# Patient Record
Sex: Male | Born: 1935
Health system: Southern US, Community
[De-identification: ages and names within clinical notes are randomized; demographics above are authoritative.]

## PROBLEM LIST (undated history)

## (undated) DIAGNOSIS — E039 Hypothyroidism, unspecified: Secondary | ICD-10-CM

## (undated) DIAGNOSIS — I10 Essential (primary) hypertension: Secondary | ICD-10-CM

## (undated) DIAGNOSIS — H409 Unspecified glaucoma: Secondary | ICD-10-CM

## (undated) DIAGNOSIS — Z8669 Personal history of other diseases of the nervous system and sense organs: Secondary | ICD-10-CM

## (undated) DIAGNOSIS — M1990 Unspecified osteoarthritis, unspecified site: Secondary | ICD-10-CM

## (undated) DIAGNOSIS — G5620 Lesion of ulnar nerve, unspecified upper limb: Secondary | ICD-10-CM

## (undated) DIAGNOSIS — J349 Unspecified disorder of nose and nasal sinuses: Secondary | ICD-10-CM

## (undated) HISTORY — DX: Lesion of ulnar nerve, unspecified upper limb: G56.20

## (undated) HISTORY — PX: JOINT REPLACEMENT: SHX530

## (undated) HISTORY — PX: TONSILLECTOMY: SUR1361

## (undated) HISTORY — PX: RETINAL DETACHMENT REPAIR W/ SCLERAL BUCKLE LE: SHX2338

## (undated) HISTORY — DX: Essential (primary) hypertension: I10

## (undated) HISTORY — DX: Personal history of other diseases of the nervous system and sense organs: Z86.69

## (undated) HISTORY — DX: Unspecified glaucoma: H40.9

## (undated) HISTORY — PX: COLONOSCOPY: SHX174

## (undated) HISTORY — PX: APPENDECTOMY: SHX54

## (undated) HISTORY — DX: Unspecified disorder of nose and nasal sinuses: J34.9

## (undated) HISTORY — PX: CATARACT EXTRACTION: SUR2

---

## 2004-11-10 ENCOUNTER — Ambulatory Visit: Payer: Self-pay | Admitting: Ophthalmology

## 2004-11-16 ENCOUNTER — Ambulatory Visit: Payer: Self-pay | Admitting: Ophthalmology

## 2010-08-05 ENCOUNTER — Ambulatory Visit: Payer: Self-pay | Admitting: Gastroenterology

## 2011-05-06 ENCOUNTER — Emergency Department: Payer: Self-pay | Admitting: Emergency Medicine

## 2011-09-27 ENCOUNTER — Ambulatory Visit: Payer: Self-pay | Admitting: Unknown Physician Specialty

## 2011-10-04 ENCOUNTER — Inpatient Hospital Stay: Payer: Self-pay | Admitting: Unknown Physician Specialty

## 2011-10-04 HISTORY — PX: KNEE SURGERY: SHX244

## 2011-10-06 ENCOUNTER — Encounter: Payer: Self-pay | Admitting: Internal Medicine

## 2011-10-28 ENCOUNTER — Encounter: Payer: Self-pay | Admitting: Internal Medicine

## 2011-11-27 ENCOUNTER — Encounter: Payer: Self-pay | Admitting: Internal Medicine

## 2011-12-28 ENCOUNTER — Encounter: Payer: Self-pay | Admitting: Internal Medicine

## 2012-01-28 ENCOUNTER — Encounter: Payer: Self-pay | Admitting: Internal Medicine

## 2013-08-16 ENCOUNTER — Emergency Department: Payer: Self-pay | Admitting: Emergency Medicine

## 2013-12-19 ENCOUNTER — Ambulatory Visit: Payer: Self-pay | Admitting: Podiatry

## 2013-12-21 ENCOUNTER — Encounter: Payer: Self-pay | Admitting: Podiatry

## 2013-12-24 ENCOUNTER — Ambulatory Visit (INDEPENDENT_AMBULATORY_CARE_PROVIDER_SITE_OTHER): Payer: Medicare Other | Admitting: Podiatry

## 2013-12-24 ENCOUNTER — Encounter: Payer: Self-pay | Admitting: Podiatry

## 2013-12-24 VITALS — BP 112/67 | HR 67 | Resp 16

## 2013-12-24 DIAGNOSIS — B351 Tinea unguium: Secondary | ICD-10-CM

## 2013-12-24 DIAGNOSIS — M79609 Pain in unspecified limb: Secondary | ICD-10-CM

## 2013-12-24 DIAGNOSIS — M79603 Pain in arm, unspecified: Secondary | ICD-10-CM

## 2013-12-24 NOTE — Progress Notes (Signed)
Daniel Carr presents today with a chief complaint of painful elongated toenails bilateral.  Objective: Pulses are strongly palpable bilateral nails are thick yellow dystrophic onychomycotic and painful palpation.  Assessment: Pain in limb secondary to onychomycosis 1 through 5 bilateral.  Plan: Debridement of nails 1 through 5 bilateral covered service secondary to pain.

## 2014-03-25 ENCOUNTER — Ambulatory Visit (INDEPENDENT_AMBULATORY_CARE_PROVIDER_SITE_OTHER): Payer: Commercial Managed Care - HMO | Admitting: Podiatry

## 2014-03-25 VITALS — Resp 16 | Ht 72.0 in | Wt 209.0 lb

## 2014-03-25 DIAGNOSIS — M79609 Pain in unspecified limb: Secondary | ICD-10-CM

## 2014-03-25 DIAGNOSIS — B351 Tinea unguium: Secondary | ICD-10-CM

## 2014-03-25 NOTE — Progress Notes (Signed)
He presents in a chief complaint of painful elongated toenails one through 5 bilateral. He also is complaining of painful lesions to the plantar aspect of the second metatarsophalangeal joint bilateral.  Objective: Vital signs are stable he is alert and oriented x3. His nails thick yellow dystrophic with mycotic painful palpation as well as debridement. He also has painful porokeratotic lesions sub-second metatarsophalangeal joint bilateral.  Assessment: Pain in limb secondary to onychomycosis 1 through 5 bilateral.  Porokeratosis bilateral foot.  Plan: Debridement of nails in thickness and length as a covered service secondary to pain debridement of all reactive hyperkeratosis.

## 2014-06-03 ENCOUNTER — Ambulatory Visit: Payer: Self-pay | Admitting: Family Medicine

## 2014-06-03 LAB — LIPID PANEL
Cholesterol: 174 mg/dL (ref 0–200)
HDL: 76 mg/dL — AB (ref 35–70)
LDL Cholesterol: 82 mg/dL
Triglycerides: 78 mg/dL (ref 40–160)

## 2014-06-03 LAB — BASIC METABOLIC PANEL
BUN: 10 mg/dL (ref 4–21)
Creatinine: 1 mg/dL (ref 0.6–1.3)
Glucose: 94 mg/dL
Potassium: 3.9 mmol/L (ref 3.4–5.3)
Sodium: 136 mmol/L — AB (ref 137–147)

## 2014-06-03 LAB — TSH: TSH: 2.83 u[IU]/mL (ref 0.41–5.90)

## 2014-06-26 ENCOUNTER — Ambulatory Visit: Payer: Commercial Managed Care - HMO | Admitting: Podiatry

## 2014-07-04 ENCOUNTER — Ambulatory Visit (INDEPENDENT_AMBULATORY_CARE_PROVIDER_SITE_OTHER): Payer: Commercial Managed Care - HMO | Admitting: Podiatry

## 2014-07-04 ENCOUNTER — Encounter: Payer: Self-pay | Admitting: Podiatry

## 2014-07-04 VITALS — BP 113/61 | HR 80 | Resp 16

## 2014-07-04 DIAGNOSIS — B351 Tinea unguium: Secondary | ICD-10-CM

## 2014-07-04 DIAGNOSIS — M79609 Pain in unspecified limb: Secondary | ICD-10-CM

## 2014-07-04 DIAGNOSIS — M79676 Pain in unspecified toe(s): Secondary | ICD-10-CM

## 2014-07-04 NOTE — Progress Notes (Signed)
He presents today for chief complaint of painful elongated toenails one through 5 bilateral and painful porokeratosis bilateral.  Objective: Vital signs are stable he is alert and oriented x3 nails are thick yellow dystrophic onychomycotic and painful palpation. Pulses are intact bilateral. Porokeratotic lesions plantar aspect the bilateral foot.  Assessment: Diabetes with hammertoe deformities bilateral. Porokeratotic lesions bilateral. Pain in limb secondary to onychomycosis 1 through 5 bilateral.  Plan: Debridement of nails 1 through 5 bilateral covered service secondary to pain.

## 2014-10-09 ENCOUNTER — Ambulatory Visit (INDEPENDENT_AMBULATORY_CARE_PROVIDER_SITE_OTHER): Payer: Commercial Managed Care - HMO | Admitting: Podiatry

## 2014-10-09 DIAGNOSIS — M79676 Pain in unspecified toe(s): Secondary | ICD-10-CM

## 2014-10-09 DIAGNOSIS — B351 Tinea unguium: Secondary | ICD-10-CM

## 2014-10-09 NOTE — Progress Notes (Signed)
Presents today chief complaint of painful elongated toenails.  Objective: Pulses are palpable bilateral nails are thick, yellow dystrophic onychomycosis and painful palpation.   Assessment: Onychomycosis with pain in limb.  Plan: Treatment of nails in thickness and length as covered service secondary to pain.  

## 2015-01-08 ENCOUNTER — Ambulatory Visit (INDEPENDENT_AMBULATORY_CARE_PROVIDER_SITE_OTHER): Payer: Commercial Managed Care - HMO | Admitting: Podiatry

## 2015-01-08 DIAGNOSIS — B351 Tinea unguium: Secondary | ICD-10-CM

## 2015-01-08 DIAGNOSIS — M79673 Pain in unspecified foot: Secondary | ICD-10-CM

## 2015-01-08 NOTE — Progress Notes (Signed)
He presents today with chief complaint of painful elongated toenails 1 through 5 bilateral.  Objective: Pain in limb secondary to onychomycosis 1 through 5 bilateral. Pulses remain palpable  Assessment: Pain limps a neurologic mycosis 1 through 5 bilateral.  Plan: Debridement of nails 1 through 5 bilateral service secondary to pain.

## 2015-01-20 DIAGNOSIS — J209 Acute bronchitis, unspecified: Secondary | ICD-10-CM | POA: Diagnosis not present

## 2015-01-27 ENCOUNTER — Ambulatory Visit: Payer: Self-pay | Admitting: Family Medicine

## 2015-01-27 DIAGNOSIS — R05 Cough: Secondary | ICD-10-CM | POA: Diagnosis not present

## 2015-01-27 DIAGNOSIS — J984 Other disorders of lung: Secondary | ICD-10-CM | POA: Diagnosis not present

## 2015-04-09 ENCOUNTER — Ambulatory Visit (INDEPENDENT_AMBULATORY_CARE_PROVIDER_SITE_OTHER): Payer: Commercial Managed Care - HMO | Admitting: Podiatry

## 2015-04-09 DIAGNOSIS — M79676 Pain in unspecified toe(s): Secondary | ICD-10-CM | POA: Diagnosis not present

## 2015-04-09 DIAGNOSIS — B351 Tinea unguium: Secondary | ICD-10-CM

## 2015-04-09 NOTE — Progress Notes (Signed)
He presents today with chief complaint of painful elongated toenails 1 through 5 bilateral.  Objective: Pain in limb secondary to onychomycosis 1 through 5 bilateral. Pulses remain palpable                   Mild sub 2 callus B. 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  Plan: Debridement of nails 1 through 5 bilateral service secondary to pain.

## 2015-04-21 ENCOUNTER — Other Ambulatory Visit: Payer: Self-pay | Admitting: Family Medicine

## 2015-06-04 DIAGNOSIS — H4011X2 Primary open-angle glaucoma, moderate stage: Secondary | ICD-10-CM | POA: Diagnosis not present

## 2015-06-05 ENCOUNTER — Encounter: Payer: Self-pay | Admitting: *Deleted

## 2015-06-06 ENCOUNTER — Encounter: Payer: Self-pay | Admitting: Family Medicine

## 2015-06-06 ENCOUNTER — Ambulatory Visit (INDEPENDENT_AMBULATORY_CARE_PROVIDER_SITE_OTHER): Payer: Commercial Managed Care - HMO | Admitting: Family Medicine

## 2015-06-06 ENCOUNTER — Other Ambulatory Visit: Payer: Self-pay | Admitting: Family Medicine

## 2015-06-06 VITALS — BP 120/80 | HR 75 | Temp 98.0°F | Resp 16 | Ht 72.0 in | Wt 215.0 lb

## 2015-06-06 DIAGNOSIS — E039 Hypothyroidism, unspecified: Secondary | ICD-10-CM | POA: Diagnosis not present

## 2015-06-06 DIAGNOSIS — E7800 Pure hypercholesterolemia, unspecified: Secondary | ICD-10-CM | POA: Insufficient documentation

## 2015-06-06 DIAGNOSIS — N401 Enlarged prostate with lower urinary tract symptoms: Secondary | ICD-10-CM

## 2015-06-06 DIAGNOSIS — I10 Essential (primary) hypertension: Secondary | ICD-10-CM

## 2015-06-06 DIAGNOSIS — M069 Rheumatoid arthritis, unspecified: Secondary | ICD-10-CM

## 2015-06-06 DIAGNOSIS — N400 Enlarged prostate without lower urinary tract symptoms: Secondary | ICD-10-CM

## 2015-06-06 DIAGNOSIS — Z125 Encounter for screening for malignant neoplasm of prostate: Secondary | ICD-10-CM | POA: Diagnosis not present

## 2015-06-06 DIAGNOSIS — N529 Male erectile dysfunction, unspecified: Secondary | ICD-10-CM

## 2015-06-06 DIAGNOSIS — H409 Unspecified glaucoma: Secondary | ICD-10-CM | POA: Insufficient documentation

## 2015-06-06 DIAGNOSIS — E079 Disorder of thyroid, unspecified: Secondary | ICD-10-CM

## 2015-06-06 DIAGNOSIS — R351 Nocturia: Secondary | ICD-10-CM

## 2015-06-06 DIAGNOSIS — Z0000 Encounter for general adult medical examination without abnormal findings: Secondary | ICD-10-CM | POA: Diagnosis not present

## 2015-06-06 DIAGNOSIS — M129 Arthropathy, unspecified: Secondary | ICD-10-CM

## 2015-06-06 NOTE — Progress Notes (Signed)
Patient: Daniel Carr, Male    DOB: [birth year], 80 y.o.   MRN: [id number] Visit Date: 06/09/2015  Today's Provider: Lelon Huh, MD   Chief Complaint  Patient presents with  . Medicare Wellness  . Hypertension  . Erectile Dysfunction  . Hyperlipidemia  . Hypothyroidism   Subjective:    Annual physica  Daniel Carr is a 80 y.o. male who presents today for his Physical.  He feels well. He reports exercising rarely. He reports he is sleeping fairly well.   ------------------------------------------------------------------------   Hypertension, follow-up:  BP Readings from Last 3 Encounters:  06/06/15 120/80  01/27/15 112/80  07/04/14 113/61    He was last seen for hypertension 12 months ago.  BP at that visit was 113/61. Management changes since that visit include none .He reports good compliance with treatment. He is not having side effects. none  He is not exercising. He is adherent to low salt diet.   Outside blood pressures are normal. He is experiencing none.  Patient denies fatigue.   .  Use of agents associated with hypertension: none.   ------------------------------------------------------------------------    Lipid/Cholesterol, Follow-up:   Last seen for this 1 years ago.  Management changes since that visit include none.  Last Lipid Panel:    Component Value Date/Time   CHOL 188 06/06/2015 1044   CHOL 174 06/03/2014   TRIG 82 06/06/2015 1044   HDL 81 06/06/2015 1044   HDL 76* 06/03/2014   CHOLHDL 2.3 06/06/2015 1044   LDLCALC 91 06/06/2015 1044   LDLCALC 82 06/03/2014    He reports good compliance with treatment. He is not having side effects. none  Wt Readings from Last 3 Encounters:  06/06/15 215 lb (97.523 kg)  01/27/15 207 lb (93.895 kg)  03/25/14 209 lb (94.802 kg)    ------------------------------------------------------------------------   ED He has done well with prn use of cialis and viagra.     Review of Systems   Constitutional: Positive for fatigue. Negative for activity change and appetite change.  HENT: Positive for congestion (Allergies) and postnasal drip. Negative for dental problem, drooling, ear discharge, ear pain, hearing loss, mouth sores, nosebleeds and sore throat.   Eyes: Negative for pain, discharge, redness and itching.  Respiratory: Positive for wheezing. Negative for cough and chest tightness.   Cardiovascular: Positive for leg swelling. Negative for chest pain and palpitations.  Gastrointestinal: Negative for abdominal pain, diarrhea, constipation, blood in stool and anal bleeding.  Genitourinary: Negative for frequency, flank pain and difficulty urinating.  Musculoskeletal: Positive for back pain. Negative for joint swelling, neck pain and neck stiffness.  Skin: Positive for rash.       Elbows  Neurological: Negative for dizziness, tremors, weakness, light-headedness, numbness and headaches.  Hematological: Does not bruise/bleed easily.  Psychiatric/Behavioral: Negative for sleep disturbance.    History   Social History  . Marital Status: Married    Spouse Name: N/A  . Number of Children: N/A  . Years of Education: college   Occupational History  . Not on file.   Social History Main Topics  . Smoking status: Former Research scientist (life sciences)  . Smokeless tobacco: Never Used     Comment: quit in 1970's  . Alcohol Use: No  . Drug Use: No  . Sexual Activity: Yes   Other Topics Concern  . Not on file   Social History Narrative    Patient Active Problem List   Diagnosis Date Noted  . Glaucoma   . Rheumatoid arthritis   . Thyroid disorder   .  Pure hypercholesterolemia   . Hypertension, benign   . Arthritis, multiple joint involvement   . BPH (benign prostatic hyperplasia)   . ED (erectile dysfunction)   . Nocturia associated with benign prostatic hypertrophy     Past Surgical History  Procedure Laterality Date  . Knee surgery  10/04/2011  . Appendectomy    . Cataract  extraction Bilateral   . Retinal detachment repair w/ scleral buckle le      His family history includes Hypertension in his father and mother; Stroke in his father and sister.    Previous Medications   ASPIRIN 81 MG TABLET    Take 81 mg by mouth daily.   BRIMONIDINE (ALPHAGAN) 0.2 % OPHTHALMIC SOLUTION       FINASTERIDE (PROSCAR) 5 MG TABLET    Take 1 tablet by mouth daily.   HYDROCHLOROTHIAZIDE (HYDRODIURIL) 25 MG TABLET       IBUPROFEN (ADVIL,MOTRIN) 200 MG TABLET    Take 1 tablet by mouth every 6 (six) hours as needed.   LEVOTHYROXINE SODIUM (SYNTHROID PO)    Take 125 mg by mouth daily.    LORATADINE (CLARITIN) 10 MG TABLET    1 tablet daily.   LOVASTATIN PO    Take 20 mg by mouth daily.    MULTIPLE VITAMIN (DAILY VITAMIN PO)    Take by mouth.   SILDENAFIL (VIAGRA) 50 MG TABLET    Take 50 mg by mouth daily as needed for erectile dysfunction.   SYNTHROID 125 MCG TABLET    Take 1 tablet by mouth daily.   TRAVATAN Z 0.004 % SOLN OPHTHALMIC SOLUTION        Patient Care Team: [name], MD as PCP - General (Family Medicine)     Objective:   Vitals: BP 120/80 mmHg  Pulse 75  Temp(Src) 98 F (36.7 C) (Oral)  Resp 16  Ht 6' (1.829 m)  Wt 215 lb (97.523 kg)  BMI 29.15 kg/m2  SpO2 97%  Physical Exam  General Appearance:    Alert, cooperative, no distress, appears stated age  Head:    Normocephalic, without obvious abnormality, atraumatic  Eyes:    PERRL, conjunctiva/corneas clear, EOM's intact, fundi    benign, both eyes       Ears:    Normal TM's and external ear canals, both ears  Nose:   Nares normal, septum midline, mucosa normal, no drainage   or sinus tenderness  Throat:   Lips, mucosa, and tongue normal; teeth and gums normal  Neck:   Supple, symmetrical, trachea midline, no adenopathy;       thyroid:  No enlargement/tenderness/nodules; no carotid   bruit or JVD  Back:     Symmetric, no curvature, ROM normal, no CVA tenderness  Lungs:     Clear to  auscultation bilaterally, respirations unlabored  Chest wall:    No tenderness or deformity  Heart:    Regular rate and rhythm, S1 and S2 normal, III/VI SM RUSB, rub  or gallop  Abdomen:     Soft, non-tender, bowel sounds active all four quadrants,    no masses, no organomegaly  Genitalia:    deferred  Rectal:    deferred  Extremities:   Extremities normal, atraumatic, no cyanosis or edema  Pulses:   2+ and symmetric all extremities  Skin:   Skin color, texture, turgor normal, no rashes or lesions  Lymph nodes:   Cervical, supraclavicular, and axillary nodes normal  Neurologic:   CNII-XII intact. Normal strength, sensation and  reflexes      throughout    Activities of Daily Living No flowsheet data found.  Fall Risk Assessment Fall Risk  06/06/2015  Falls in the past year? No     Depression Screen PHQ 2/9 Scores 06/06/2015  PHQ - 2 Score 0    Cognitive Testing - 6-CIT    Year: 0 points  Month: 0 points  Memorize "Pia Mau, [address], [place]"  Time (within 1 hour:) 0 points  Count backwards from 20: 0 points  Name months of year: 0 points  Repeat Address: 2 points   Total Score: 2/28  Interpretation : Normal (0-7)  Assessment & Plan:     Annual Wellness Visit  Reviewed patient's Family Medical History Reviewed and updated list of patient's medical providers Assessment of cognitive impairment was done Assessed patient's functional ability Established a written schedule for health screening Day Completed and Reviewed  Exercise Activities and Dietary recommendations Goals    None      Immunization History  Administered Date(s) Administered  . Influenza-Unspecified 09/19/2014  . Pneumococcal Conjugate-13 06/03/2014  . Pneumococcal Polysaccharide-23 04/16/2003  . Td 03/04/1997  . Tdap 11/09/2011  . Zoster 05/09/2008, 06/03/2014    Health Maintenance  Topic Date Due  . INFLUENZA VACCINE  07/28/2015  . COLONOSCOPY   08/05/2020  . TETANUS/TDAP  11/08/2021  . ZOSTAVAX  Completed  . PNA vac Low Risk Adult  Completed      Discussed health benefits of physical activity, and encouraged him to engage in regular exercise appropriate for his age and condition.    Problem List Items Addressed This Visit    ED (erectile dysfunction)     Given samples Viagra 100mg  +2 box   Hypertension, benign Well controlld, continue current meds   Relevant Orders   Renal function panel (Completed)   EKG 12-Lead (Completed)   Pure hypercholesterolemia - Primary He is tolerating lovastatin well with no adverse effects.     Relevant Orders   Lipid panel (Completed)   Thyroid disorder   Relevant Orders   TSH (Completed)    Other Visit Diagnoses    Annual physical exam        Prostate cancer screening        Relevant Orders    PSA (Completed)

## 2015-06-07 LAB — LIPID PANEL
Chol/HDL Ratio: 2.3 ratio units (ref 0.0–5.0)
Cholesterol, Total: 188 mg/dL (ref 100–199)
HDL: 81 mg/dL (ref >39)
LDL Calculated: 91 mg/dL (ref 0–99)
Triglycerides: 82 mg/dL (ref 0–149)
VLDL Cholesterol Cal: 16 mg/dL (ref 5–40)

## 2015-06-07 LAB — RENAL FUNCTION PANEL
Albumin: 4.4 g/dL (ref 3.5–4.8)
BUN/Creatinine Ratio: 11 (ref 10–22)
BUN: 11 mg/dL (ref 8–27)
CO2: 29 mmol/L (ref 18–29)
Calcium: 9.7 mg/dL (ref 8.6–10.2)
Chloride: 97 mmol/L (ref 97–108)
Creatinine, Ser: 0.96 mg/dL (ref 0.76–1.27)
GFR calc Af Amer: 87 mL/min/{1.73_m2} (ref >59)
GFR calc non Af Amer: 75 mL/min/{1.73_m2} (ref >59)
Glucose: 89 mg/dL (ref 65–99)
Phosphorus: 3.5 mg/dL (ref 2.5–4.5)
Potassium: 4.1 mmol/L (ref 3.5–5.2)
Sodium: 139 mmol/L (ref 134–144)

## 2015-06-07 LAB — PSA: Prostate Specific Ag, Serum: 2.9 ng/mL (ref 0.0–4.0)

## 2015-06-07 LAB — TSH: TSH: 1.42 u[IU]/mL (ref 0.450–4.500)

## 2015-06-09 DIAGNOSIS — M129 Arthropathy, unspecified: Secondary | ICD-10-CM | POA: Insufficient documentation

## 2015-06-09 DIAGNOSIS — G5620 Lesion of ulnar nerve, unspecified upper limb: Secondary | ICD-10-CM | POA: Insufficient documentation

## 2015-06-12 DIAGNOSIS — H4011X2 Primary open-angle glaucoma, moderate stage: Secondary | ICD-10-CM | POA: Diagnosis not present

## 2015-06-13 DIAGNOSIS — H26491 Other secondary cataract, right eye: Secondary | ICD-10-CM | POA: Diagnosis not present

## 2015-07-16 ENCOUNTER — Ambulatory Visit: Payer: Commercial Managed Care - HMO | Admitting: Podiatry

## 2015-07-23 ENCOUNTER — Ambulatory Visit (INDEPENDENT_AMBULATORY_CARE_PROVIDER_SITE_OTHER): Payer: Commercial Managed Care - HMO | Admitting: Podiatry

## 2015-07-23 ENCOUNTER — Encounter: Payer: Self-pay | Admitting: Podiatry

## 2015-07-23 DIAGNOSIS — M79676 Pain in unspecified toe(s): Secondary | ICD-10-CM | POA: Diagnosis not present

## 2015-07-23 DIAGNOSIS — B351 Tinea unguium: Secondary | ICD-10-CM

## 2015-07-23 DIAGNOSIS — Q828 Other specified congenital malformations of skin: Secondary | ICD-10-CM

## 2015-07-23 NOTE — Progress Notes (Signed)
Patient presents to the office today with a chief complaint of painful elongated toenails and hyperkeratotic lesions.  Objective: Pulses are palpable bilateral. Nails are thick yellow dystrophic clinically mycotic and painful palpation.  Assessment: Pain in limb secondary to onychomycosis 1 through 5 bilateral and hyperkeratotic lesions.  Plan: Debridement of nails 1 through 5 bilateral covered service secondary to pain and debridement hyperkeratotic lesions.  

## 2015-07-28 ENCOUNTER — Other Ambulatory Visit: Payer: Self-pay | Admitting: Family Medicine

## 2015-07-28 NOTE — Telephone Encounter (Signed)
Last ov was on 06/06/2015

## 2015-08-26 DIAGNOSIS — J301 Allergic rhinitis due to pollen: Secondary | ICD-10-CM | POA: Diagnosis not present

## 2015-08-26 DIAGNOSIS — J3089 Other allergic rhinitis: Secondary | ICD-10-CM | POA: Diagnosis not present

## 2015-10-07 ENCOUNTER — Ambulatory Visit (INDEPENDENT_AMBULATORY_CARE_PROVIDER_SITE_OTHER): Payer: Commercial Managed Care - HMO | Admitting: Family Medicine

## 2015-10-07 DIAGNOSIS — Z23 Encounter for immunization: Secondary | ICD-10-CM | POA: Diagnosis not present

## 2015-10-21 ENCOUNTER — Telehealth: Payer: Self-pay

## 2015-10-21 NOTE — Telephone Encounter (Signed)
Needs a referral to Dr. Milinda Pointer for Monday appt due to his insurance. Please review.-aa Please let pt know when this is done please-aa

## 2015-10-27 ENCOUNTER — Ambulatory Visit (INDEPENDENT_AMBULATORY_CARE_PROVIDER_SITE_OTHER): Payer: Commercial Managed Care - HMO | Admitting: Podiatry

## 2015-10-27 ENCOUNTER — Encounter: Payer: Self-pay | Admitting: Podiatry

## 2015-10-27 DIAGNOSIS — B351 Tinea unguium: Secondary | ICD-10-CM | POA: Diagnosis not present

## 2015-10-27 DIAGNOSIS — Q828 Other specified congenital malformations of skin: Secondary | ICD-10-CM

## 2015-10-27 DIAGNOSIS — M79676 Pain in unspecified toe(s): Secondary | ICD-10-CM

## 2015-10-27 NOTE — Progress Notes (Signed)
He presents today with a chief complaint of painful elongated toenails with corns and calluses.  Objective: Vital signs are stable he is alert and oriented 3. Pulses are strongly palpable. His nails are thick yellow dystrophic likely mycotic and painful palpation. Porokeratosis subsecond metatarsophalangeal joints are present bilateral.  Assessment: Pain in limb secondary to onychomycosis porokeratosis.  Plan: Debridement of all reactive hyperkeratosis. Debridement of nails 1 through 5 bilateral.  Roselind Messier DPM

## 2015-11-27 ENCOUNTER — Other Ambulatory Visit: Payer: Self-pay | Admitting: Family Medicine

## 2015-12-24 ENCOUNTER — Telehealth: Payer: Self-pay | Admitting: Family Medicine

## 2015-12-25 ENCOUNTER — Other Ambulatory Visit: Payer: Self-pay | Admitting: Family Medicine

## 2016-01-01 NOTE — Telephone Encounter (Signed)
Miscellaneous entry

## 2016-01-26 ENCOUNTER — Encounter: Payer: Self-pay | Admitting: Podiatry

## 2016-01-26 ENCOUNTER — Ambulatory Visit: Payer: Commercial Managed Care - HMO

## 2016-01-26 ENCOUNTER — Ambulatory Visit (INDEPENDENT_AMBULATORY_CARE_PROVIDER_SITE_OTHER): Payer: Commercial Managed Care - HMO | Admitting: Podiatry

## 2016-01-26 DIAGNOSIS — Q828 Other specified congenital malformations of skin: Secondary | ICD-10-CM

## 2016-01-26 DIAGNOSIS — M79676 Pain in unspecified toe(s): Secondary | ICD-10-CM | POA: Diagnosis not present

## 2016-01-26 DIAGNOSIS — B351 Tinea unguium: Secondary | ICD-10-CM | POA: Diagnosis not present

## 2016-01-26 NOTE — Progress Notes (Signed)
He presents today with a chief complaint of painful elongated toenails with corns and calluses.  Objective: Vital signs are stable he is alert and oriented 3 pulses are strongly palpable. Severe tibial deformity and malalignment of his toes. Multiple callosities plantar aspect of the bilateral foot with thick yellow dystrophic onychomycotic nails.  Assessment: Pain in limb secondary to onychomycosis and porokeratosis.  Plan: Debridement of toenails and reactive hyperkeratosis bilateral. Follow up with him in 3 months.

## 2016-03-15 ENCOUNTER — Encounter: Payer: Self-pay | Admitting: Family Medicine

## 2016-03-15 ENCOUNTER — Ambulatory Visit (INDEPENDENT_AMBULATORY_CARE_PROVIDER_SITE_OTHER): Payer: Commercial Managed Care - HMO | Admitting: Family Medicine

## 2016-03-15 VITALS — BP 120/80 | HR 98 | Temp 101.1°F | Resp 18 | Wt 218.6 lb

## 2016-03-15 DIAGNOSIS — R05 Cough: Secondary | ICD-10-CM | POA: Diagnosis not present

## 2016-03-15 DIAGNOSIS — J111 Influenza due to unidentified influenza virus with other respiratory manifestations: Secondary | ICD-10-CM

## 2016-03-15 DIAGNOSIS — R509 Fever, unspecified: Secondary | ICD-10-CM

## 2016-03-15 DIAGNOSIS — R059 Cough, unspecified: Secondary | ICD-10-CM

## 2016-03-15 LAB — POC INFLUENZA A&B (BINAX/QUICKVUE)
Influenza A, POC: NEGATIVE
Influenza B, POC: POSITIVE — AB

## 2016-03-15 MED ORDER — OSELTAMIVIR PHOSPHATE 75 MG PO CAPS: 75.0000 mg | ORAL_CAPSULE | Freq: Two times a day (BID) | ORAL | Status: DC

## 2016-03-15 NOTE — Patient Instructions (Signed)
Discussed use of Delsym over the counter.

## 2016-03-15 NOTE — Progress Notes (Signed)
Subjective:     Patient ID: Daniel Carr, male   DOB: Oct 08, 1935, 81 y.o.   MRN: [id number]  HPI  Chief Complaint  Patient presents with  . Cough    Patient comes in office today with concerns of cough and congestion for the past 48hrs. Patient reports post nasal drip/clear nasal drainage and shortness of breath. Patient has tried taking otc Mucinex for relief.   States he also had transient sore throat and body aches. Wife is sick as well. + flu shot. Not currently on immunosuppressive medication.   Review of Systems     Objective:   Physical Exam  Constitutional: He appears well-developed and well-nourished. No distress.  Ears: T.M's intact without inflammation Throat: tonsils absent Neck: no cervical adenopathy Lungs: left basilar crackles     Assessment:    1. Cough - POC Influenza A&B(BINAX/QUICKVUE)  2. Fever, unspecified fever cause - POC Influenza A&B(BINAX/QUICKVUE)  3. Fl - oseltamivir (TAMIFLU) 75 MG capsule; Take 1 capsule (75 mg total) by mouth 2 (two) times daily.  Dispense: 10 capsule; Refill: 0    Plan:    Discussed use of Delsym.

## 2016-03-18 ENCOUNTER — Telehealth: Payer: Self-pay | Admitting: Family Medicine

## 2016-03-18 MED ORDER — BENZONATATE 100 MG PO CAPS: 100.0000 mg | ORAL_CAPSULE | Freq: Two times a day (BID) | ORAL | Status: DC | PRN

## 2016-03-18 NOTE — Telephone Encounter (Signed)
Patient would like a RX to help with cough.

## 2016-03-18 NOTE — Telephone Encounter (Signed)
Have sent rx for tessalon to [facility]

## 2016-03-18 NOTE — Telephone Encounter (Signed)
Pt states he was see on Monday for the flu.  Pt states he has a cough and is requesting a Rx to help with the cough.  [facility]  CB#[phone]/MW

## 2016-03-18 NOTE — Telephone Encounter (Signed)
Patient advised as directed below. 

## 2016-03-22 ENCOUNTER — Other Ambulatory Visit: Payer: Self-pay | Admitting: Family Medicine

## 2016-03-31 DIAGNOSIS — H401131 Primary open-angle glaucoma, bilateral, mild stage: Secondary | ICD-10-CM | POA: Diagnosis not present

## 2016-04-26 ENCOUNTER — Encounter: Payer: Self-pay | Admitting: Podiatry

## 2016-04-26 ENCOUNTER — Ambulatory Visit (INDEPENDENT_AMBULATORY_CARE_PROVIDER_SITE_OTHER): Payer: Commercial Managed Care - HMO | Admitting: Podiatry

## 2016-04-26 DIAGNOSIS — M79676 Pain in unspecified toe(s): Secondary | ICD-10-CM

## 2016-04-26 DIAGNOSIS — B351 Tinea unguium: Secondary | ICD-10-CM

## 2016-04-26 DIAGNOSIS — Q828 Other specified congenital malformations of skin: Secondary | ICD-10-CM

## 2016-04-26 NOTE — Progress Notes (Signed)
He presents today with a chief complaint of porokeratosis plantar aspect of the bilateral foot as well as thick painful mycotic nails. He states that his wife has recently been diagnosed with dementia.  Objective: Vital signs are stable alert and oriented 3. Pulses are palpable. Toenails are thick yellow dystrophic onychomycotic and painful palpation. Multiple porokeratosis plantar aspect of the bilateral foot.  Assessment: Pain in limb secondary to onychomycosis 1 through 5 bilateral.  Plan: Debridement of all reactive hyperkeratosis. Debridement of toenails 1 through 5 bilateral.

## 2016-06-07 IMAGING — CR DG CHEST 2V
1 series · 2 of 2 positions shown · non-contrast
Comparison: None.

CLINICAL DATA: Cough.  Wheezing.

EXAM:
CHEST  2 VIEW

[Series 1: kdxr chest pa (or ap) and lat · 0.14mm/px · 2 of 2 slices shown]
[im 1/2]
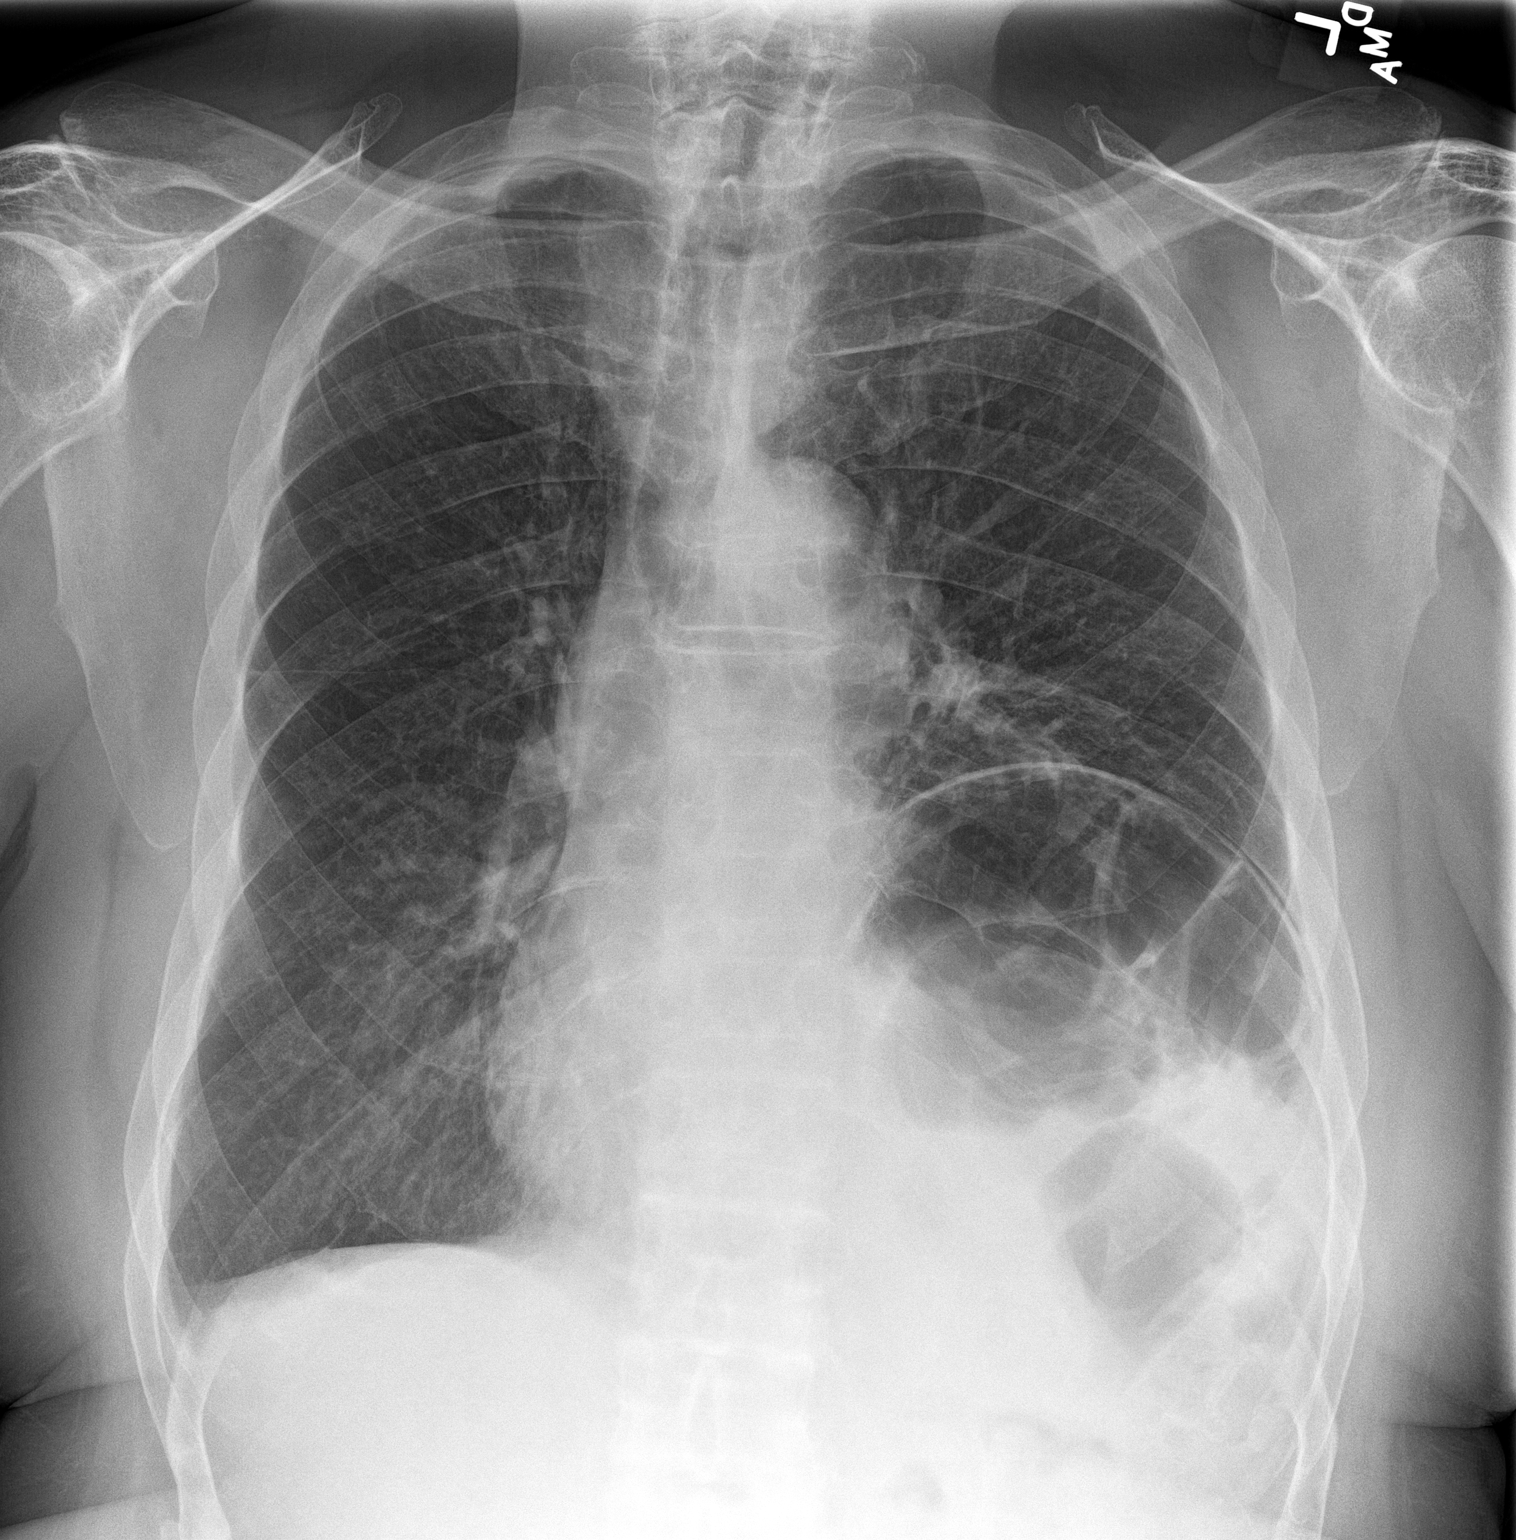
[im 2/2]
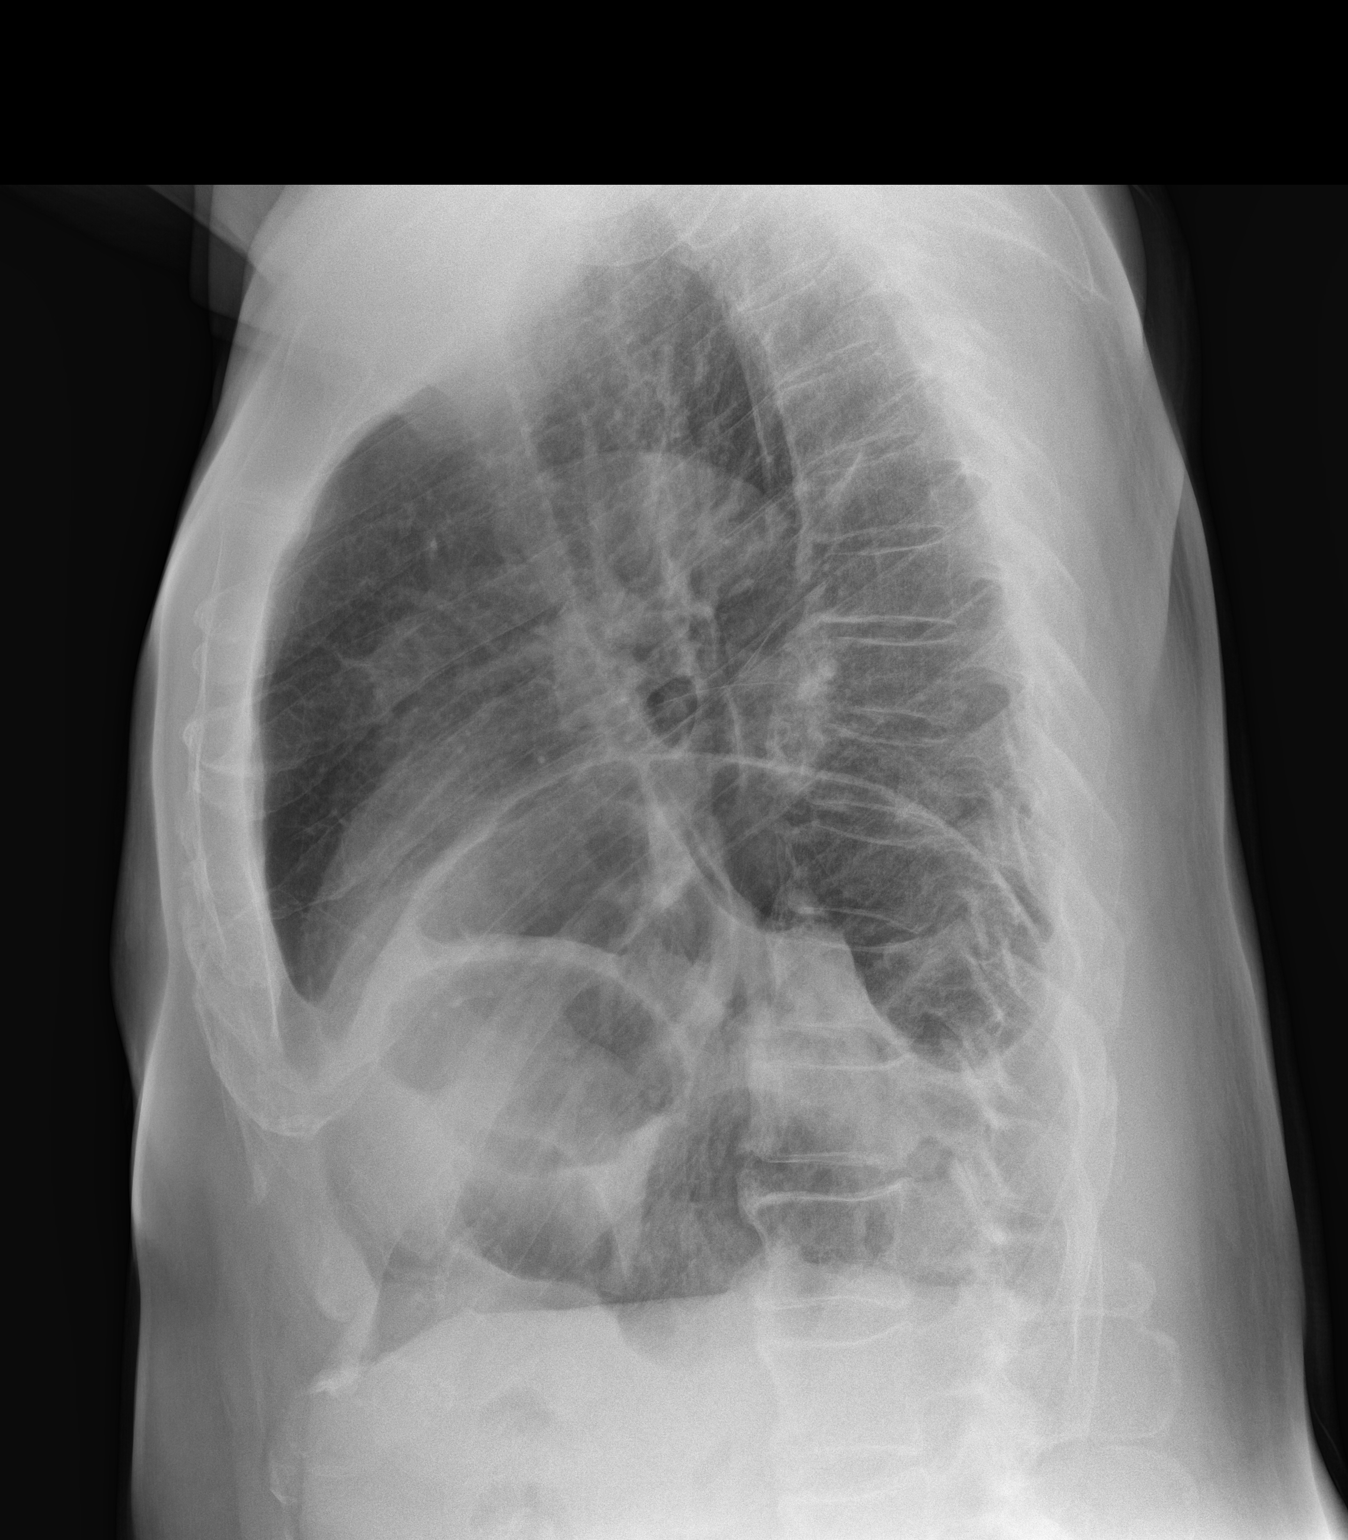

[2 of 2 positions shown; findings below may reference images not displayed]

FINDINGS: Mediastinum and hilar structures normal. No focal pulmonary
infiltrate. Left base pleural parenchymal thickening again noted
consistent scarring. Stable elevation left hemidiaphragm.
Interposition of bowel under the hemidiaphragm is again noted. Heart
size stable. Degenerative changes thoracic spine.
IMPRESSION: 1. Stable left base pleural parenchymal scarring and elevation of
left hemidiaphragm.
2. No acute pulmonary disease.

## 2016-06-08 ENCOUNTER — Ambulatory Visit (INDEPENDENT_AMBULATORY_CARE_PROVIDER_SITE_OTHER): Payer: Commercial Managed Care - HMO | Admitting: Family Medicine

## 2016-06-08 ENCOUNTER — Encounter: Payer: Self-pay | Admitting: Family Medicine

## 2016-06-08 VITALS — BP 106/72 | HR 80 | Temp 98.0°F | Resp 16 | Ht 72.5 in | Wt 212.0 lb

## 2016-06-08 DIAGNOSIS — I10 Essential (primary) hypertension: Secondary | ICD-10-CM | POA: Diagnosis not present

## 2016-06-08 DIAGNOSIS — Z0000 Encounter for general adult medical examination without abnormal findings: Secondary | ICD-10-CM | POA: Diagnosis not present

## 2016-06-08 DIAGNOSIS — E039 Hypothyroidism, unspecified: Secondary | ICD-10-CM | POA: Diagnosis not present

## 2016-06-08 DIAGNOSIS — Z125 Encounter for screening for malignant neoplasm of prostate: Secondary | ICD-10-CM | POA: Diagnosis not present

## 2016-06-08 NOTE — Progress Notes (Signed)
Patient: Daniel Carr, Male    DOB: [birth year], 81 y.o.   MRN: [id number] Visit Date: 06/08/2016  Today's Provider: Lelon Huh, MD   Chief Complaint  Patient presents with  . Annual Exam  . Hypertension  . Hypothyroidism  . Hyperlipidemia   Subjective:    Annual physical  Daniel Carr is a 81 y.o. male. He feels well. He reports never exercising. He reports he is sleeping fairly well.  -----------------------------------------------------------  Hypertension, follow-up:  BP Readings from Last 3 Encounters:  03/15/16 120/80  06/06/15 120/80  01/27/15 112/80    He was last seen for hypertension 1 years ago.  BP at that visit was 120/80. Management since that visit includes no changes. He reports good compliance with treatment. He is not having side effects.  He is not exercising. He is adherent to low salt diet.   Outside blood pressures are 120's/80's. He is experiencing dyspnea.  Patient denies chest pain, chest pressure/discomfort, claudication, exertional chest pressure/discomfort, fatigue, irregular heart beat, lower extremity edema, near-syncope, orthopnea, palpitations, paroxysmal nocturnal dyspnea, syncope and tachypnea.   Cardiovascular risk factors include advanced age (older than 53 for men, 10 for women), hypertension, male gender and sedentary lifestyle.  Use of agents associated with hypertension: NSAIDS.     Weight trend: decreasing steadily Wt Readings from Last 3 Encounters:  03/15/16 218 lb 9.6 oz (99.156 kg)  06/06/15 215 lb (97.523 kg)  01/27/15 207 lb (93.895 kg)    Current diet: in general, a "healthy" diet    ------------------------------------------------------------------------   Lipid/Cholesterol, Follow-up:   Last seen for this1 years ago.  Management changes since that visit include none. . Last Lipid Panel:    Component Value Date/Time   CHOL 188 06/06/2015 1044   CHOL 174 06/03/2014   TRIG 82 06/06/2015 1044   HDL 81 06/06/2015 1044   HDL 76* 06/03/2014   CHOLHDL 2.3 06/06/2015 1044   LDLCALC 91 06/06/2015 1044   LDLCALC 82 06/03/2014    Risk factors for vascular disease include hypercholesterolemia and hypertension  He reports good compliance with treatment. He is not having side effects.  Current symptoms include none and have been stable. Weight trend: decreasing steadily Prior visit with dietician: no Current diet: in general, a "healthy" diet   Current exercise: none   Wt Readings from Last 3 Encounters:  03/15/16 218 lb 9.6 oz (99.156 kg)  06/06/15 215 lb (97.523 kg)  01/27/15 207 lb (93.895 kg)    ------------------------------------------------------------------- Follow up Hypothyroid:  Last office visit was 06/06/2015 and no changes were made.  Patient reports good compliance with treatment, and good tolerance.   Review of Systems  HENT: Positive for rhinorrhea and sinus pressure.   Eyes: Positive for itching.  Respiratory: Positive for shortness of breath.   Gastrointestinal: Positive for constipation.  Genitourinary: Positive for frequency.  Musculoskeletal: Positive for arthralgias.  Skin: Rash: near elbows.    Social History   Social History  . Marital Status: Married    Spouse Name: N/A  . Number of Children: N/A  . Years of Education: college   Occupational History  . Not on file.   Social History Main Topics  . Smoking status: Former Research scientist (life sciences)  . Smokeless tobacco: Never Used     Comment: quit in 1970's  . Alcohol Use: No  . Drug Use: No  . Sexual Activity: Yes   Other Topics Concern  . Not on file   Social History Narrative  Past Medical History  Diagnosis Date  . Sinus problem   . childhood infection     chicken pox, measels, and mumps  . Ulnar neuropathy      Patient Active Problem List   Diagnosis Date Noted  . Arthropathia 06/09/2015  . Neuropathic ulnar nerve 06/09/2015  . Glaucoma   . Rheumatoid arthritis ([place])   .  Hypothyroid   . Pure hypercholesterolemia   . Hypertension, benign   . Arthritis, multiple joint involvement   . BPH (benign prostatic hyperplasia)   . ED (erectile dysfunction)   . Nocturia associated with benign prostatic hypertrophy     Past Surgical History  Procedure Laterality Date  . Knee surgery  10/04/2011  . Appendectomy    . Cataract extraction Bilateral   . Retinal detachment repair w/ scleral buckle le      His family history includes Hypertension in his father and mother; Stroke in his father and sister.    Current Meds  Medication Sig  . aspirin 81 MG tablet Take 81 mg by mouth daily.  . brimonidine (ALPHAGAN) 0.2 % ophthalmic solution   . Denture Care Products (DENTURE ADHESIVE) CREA   . finasteride (PROSCAR) 5 MG tablet TAKE ONE TABLET BY MOUTH ONCE DAILY  . hydrochlorothiazide (HYDRODIURIL) 25 MG tablet TAKE ONE-HALF TABLET BY MOUTH ONCE DAILY  . ibuprofen (ADVIL,MOTRIN) 200 MG tablet Take 1 tablet by mouth every 6 (six) hours as needed.  . Multiple Vitamin (DAILY VITAMIN PO) Take by mouth.  . SYNTHROID 125 MCG tablet TAKE ONE TABLET BY MOUTH ONCE DAILY  . TRAVATAN Z 0.004 % SOLN ophthalmic solution     Patient Care Team: [name], MD as PCP - General (Family Medicine)   .MEDST  Objective:   Vitals: BP 106/72 mmHg  Pulse 80  Temp(Src) 98 F (36.7 C) (Oral)  Resp 16  Ht 6' 0.5" (1.842 m)  Wt 212 lb (96.163 kg)  BMI 28.34 kg/m2  SpO2 98%  Physical Exam   General Appearance:    Alert, cooperative, no distress, appears stated age  Head:    Normocephalic, without obvious abnormality, atraumatic  Eyes:    PERRL, conjunctiva/corneas clear, EOM's intact, fundi    benign, both eyes       Ears:    Normal TM's and external ear canals, both ears  Nose:   Nares normal, septum midline, mucosa normal, no drainage   or sinus tenderness  Throat:   Lips, mucosa, and tongue normal; teeth and gums normal  Neck:   Supple, symmetrical, trachea midline, no  adenopathy;       thyroid:  No enlargement/tenderness/nodules; no carotid   bruit or JVD  Back:     Symmetric, no curvature, ROM normal, no CVA tenderness  Lungs:     Clear to auscultation bilaterally, respirations unlabored  Chest wall:    No tenderness or deformity  Heart:    Regular rate and rhythm, S1 and S2 normal, II/VI RUSB systolicmurmer, rub   or gallop  Abdomen:     Soft, non-tender, bowel sounds active all four quadrants,    no masses, no organomegaly  Genitalia:    deferred  Rectal:    deferred  Extremities:   no cyanosis or edema  Pulses:   2+ and symmetric all extremities  Skin:   Skin color, texture, turgor normal, no rashes or lesions  Lymph nodes:   Cervical, supraclavicular, and axillary nodes normal  Neurologic:   CNII-XII intact. Normal strength, sensation and  reflexes      throughout    Activities of Daily Living In your present state of health, do you have any difficulty performing the following activities: 06/08/2016  Hearing? N  Vision? N  Difficulty concentrating or making decisions? N  Walking or climbing stairs? Y  Dressing or bathing? N  Doing errands, shopping? N    Fall Risk Assessment Fall Risk  06/08/2016 06/06/2015  Falls in the past year? No No     Depression Screen PHQ 2/9 Scores 06/08/2016 06/06/2015  PHQ - 2 Score 0 0  PHQ- 9 Score 1 -    Cognitive Testing - 6-CIT  Correct? Score   What year is it? yes 0 0 or 4  What month is it? yes 0 0 or 3  Memorize:    Pia Mau,  44,  [place],      What time is it? (within 1 hour) yes 0 0 or 3  Count backwards from 20 yes 0 0, 2, or 4  Name the months of the year yes 0 0, 2, or 4  Repeat name & address above no 4 0, 2, 4, 6, 8, or 10       TOTAL SCORE  4/28   Interpretation:  Normal  Normal (0-7) Abnormal (8-28)     Audit-C Alcohol Use Screening  Question Answer Points  How often do you have alcoholic drink? never 0  On days you do drink alcohol, how many drinks do you  typically consume? n/a 0  How oftey will you drink 6 or more in a total? never 0  Total Score:  0   A score of 3 or more in women, and 4 or more in men indicates increased risk for alcohol abuse, EXCEPT if all of the points are from question 1. Current Exercise Habits: The patient does not participate in regular exercise at present Exercise limited by: Other - see comments (has caluses under his feet that limits exercise tolerance)   Assessment & Plan:    Annual Physical Reviewed patient's Family Medical History Reviewed and updated list of patient's medical providers Assessment of cognitive impairment was done Assessed patient's functional ability Established a written schedule for health screening [place] Completed and Reviewed  Exercise Activities and Dietary recommendations Goals    None      Immunization History  Administered Date(s) Administered  . Influenza, High Dose Seasonal PF 10/07/2015  . Influenza-Unspecified 09/19/2014  . Pneumococcal Conjugate-13 06/03/2014  . Pneumococcal Polysaccharide-23 04/16/2003  . Td 03/04/1997  . Tdap 11/09/2011  . Zoster 05/09/2008, 06/03/2014    Health Maintenance  Topic Date Due  . INFLUENZA VACCINE  07/27/2016  . TETANUS/TDAP  11/08/2021  . ZOSTAVAX  Completed  . PNA vac Low Risk Adult  Completed      Discussed health benefits of physical activity, and encouraged him to engage in regular exercise appropriate for his age and condition.    ------------------------------------------------------------------------------------------------------------ 1. Annual physical exam Generally doing well  2. Hypothyroidism, unspecified hypothyroidism type  - TSH  3. Hypertension, benign Well controlled.  Continue current medications.   - EKG 12-Lead - Renal function panel  4. Prostate cancer screening  - PSA    Lelon Huh, MD  [facility]

## 2016-06-09 ENCOUNTER — Telehealth: Payer: Self-pay | Admitting: *Deleted

## 2016-06-09 LAB — RENAL FUNCTION PANEL
Albumin: 4.1 g/dL (ref 3.5–4.8)
BUN/Creatinine Ratio: 13 (ref 10–24)
BUN: 13 mg/dL (ref 8–27)
CO2: 29 mmol/L (ref 18–29)
Calcium: 9.3 mg/dL (ref 8.6–10.2)
Chloride: 97 mmol/L (ref 96–106)
Creatinine, Ser: 1.01 mg/dL (ref 0.76–1.27)
GFR calc Af Amer: 81 mL/min/{1.73_m2} (ref >59)
GFR calc non Af Amer: 70 mL/min/{1.73_m2} (ref >59)
Glucose: 92 mg/dL (ref 65–99)
Phosphorus: 3 mg/dL (ref 2.5–4.5)
Potassium: 4.1 mmol/L (ref 3.5–5.2)
Sodium: 139 mmol/L (ref 134–144)

## 2016-06-09 LAB — TSH: TSH: 2.29 u[IU]/mL (ref 0.450–4.500)

## 2016-06-09 LAB — PSA: Prostate Specific Ag, Serum: 2.5 ng/mL (ref 0.0–4.0)

## 2016-06-09 NOTE — Telephone Encounter (Signed)
Patient was notified. Expressed understanding.  

## 2016-06-09 NOTE — Telephone Encounter (Signed)
No, he can stop taking it.

## 2016-06-09 NOTE — Telephone Encounter (Signed)
Patient wanted to know if he is to continue taking the 1/2 tablet of HCTZ 25 mg?

## 2016-07-26 ENCOUNTER — Ambulatory Visit: Payer: Commercial Managed Care - HMO | Admitting: Podiatry

## 2016-07-27 ENCOUNTER — Telehealth: Payer: Self-pay | Admitting: Family Medicine

## 2016-07-27 DIAGNOSIS — L84 Corns and callosities: Secondary | ICD-10-CM | POA: Insufficient documentation

## 2016-07-27 DIAGNOSIS — L603 Nail dystrophy: Secondary | ICD-10-CM | POA: Insufficient documentation

## 2016-07-27 NOTE — Telephone Encounter (Signed)
Please advise 

## 2016-07-27 NOTE — Telephone Encounter (Signed)
Patient is calling wanting a referral to the [facility] authorized. He has an appointment on Monday August 7th. Please call patient if you have any questions. [phone]  KB

## 2016-07-27 NOTE — Telephone Encounter (Signed)
Patient stated that he sees Dr. Milinda Pointer occasionally concerning painful toe nails, corns, and calluses on feet. Patient stated that he has to have a referral from pcp. There are ov notes in epic where pt has seen Dr. Milinda Pointer in the past for this.

## 2016-07-27 NOTE — Telephone Encounter (Signed)
Need to know medical reason for all referrals. Thanks.

## 2016-07-27 NOTE — Telephone Encounter (Signed)
Please refer to podiatry for follow up nail dystrophy and calluses on feet. His appointment is already scheduled for 08/02/16. Thanks.

## 2016-08-02 ENCOUNTER — Encounter: Payer: Self-pay | Admitting: Podiatry

## 2016-08-02 ENCOUNTER — Ambulatory Visit (INDEPENDENT_AMBULATORY_CARE_PROVIDER_SITE_OTHER): Payer: Commercial Managed Care - HMO | Admitting: Podiatry

## 2016-08-02 DIAGNOSIS — Q828 Other specified congenital malformations of skin: Secondary | ICD-10-CM

## 2016-08-02 DIAGNOSIS — B351 Tinea unguium: Secondary | ICD-10-CM

## 2016-08-02 DIAGNOSIS — M79676 Pain in unspecified toe(s): Secondary | ICD-10-CM | POA: Diagnosis not present

## 2016-08-02 NOTE — Progress Notes (Signed)
He presents today for follow-up of his calluses and toenails.  Objective: Vital signs are stable alert and oriented 3. Pulses are palpable. Neurologic sensorium is intact. Deep tendon reflexes are intact. Toenails are thick yellow dystrophic onychomycotic and pain on palpation.  Assessment: Pain limb secondary to onychomycosis.  Plan: Debridement toenails 1 through 5 bilateral.

## 2016-08-18 ENCOUNTER — Other Ambulatory Visit: Payer: Self-pay | Admitting: Family Medicine

## 2016-08-19 DIAGNOSIS — J301 Allergic rhinitis due to pollen: Secondary | ICD-10-CM | POA: Diagnosis not present

## 2016-08-19 DIAGNOSIS — R05 Cough: Secondary | ICD-10-CM | POA: Diagnosis not present

## 2016-08-19 DIAGNOSIS — J3089 Other allergic rhinitis: Secondary | ICD-10-CM | POA: Diagnosis not present

## 2016-10-01 DIAGNOSIS — H401131 Primary open-angle glaucoma, bilateral, mild stage: Secondary | ICD-10-CM | POA: Diagnosis not present

## 2016-10-08 DIAGNOSIS — H401131 Primary open-angle glaucoma, bilateral, mild stage: Secondary | ICD-10-CM | POA: Diagnosis not present

## 2016-10-16 ENCOUNTER — Ambulatory Visit (INDEPENDENT_AMBULATORY_CARE_PROVIDER_SITE_OTHER): Payer: Commercial Managed Care - HMO

## 2016-10-16 DIAGNOSIS — Z23 Encounter for immunization: Secondary | ICD-10-CM

## 2016-11-03 ENCOUNTER — Ambulatory Visit (INDEPENDENT_AMBULATORY_CARE_PROVIDER_SITE_OTHER): Payer: Commercial Managed Care - HMO | Admitting: Podiatry

## 2016-11-03 ENCOUNTER — Encounter: Payer: Self-pay | Admitting: Podiatry

## 2016-11-03 DIAGNOSIS — Q828 Other specified congenital malformations of skin: Secondary | ICD-10-CM | POA: Diagnosis not present

## 2016-11-03 DIAGNOSIS — M79676 Pain in unspecified toe(s): Secondary | ICD-10-CM

## 2016-11-03 DIAGNOSIS — B351 Tinea unguium: Secondary | ICD-10-CM | POA: Diagnosis not present

## 2016-11-03 NOTE — Progress Notes (Signed)
He presents today to complaint of painful elongated toenails and multiple porokeratosis bilateral.  Objective: Pulses remain palpable digital deformities are present. Nails are thick yellow dystrophic with mycotic multiple areas of porokeratotic lesions plantar aspect bilateral foot.  Assessment: Pain limb segment onychomycosis porokeratosis.  Plan: Debridement of all reactive hyperkeratosis and debridement of toenails 1 through 5 bilateral covered service.

## 2017-01-12 ENCOUNTER — Ambulatory Visit: Payer: Commercial Managed Care - HMO | Admitting: Family Medicine

## 2017-01-13 ENCOUNTER — Other Ambulatory Visit: Payer: Self-pay | Admitting: Family Medicine

## 2017-01-14 ENCOUNTER — Encounter: Payer: Self-pay | Admitting: Family Medicine

## 2017-01-14 ENCOUNTER — Ambulatory Visit (INDEPENDENT_AMBULATORY_CARE_PROVIDER_SITE_OTHER): Payer: Commercial Managed Care - HMO | Admitting: Family Medicine

## 2017-01-14 VITALS — BP 120/72 | HR 74 | Temp 98.1°F | Resp 18 | Wt 216.0 lb

## 2017-01-14 DIAGNOSIS — L309 Dermatitis, unspecified: Secondary | ICD-10-CM

## 2017-01-14 MED ORDER — TRIAMCINOLONE ACETONIDE 0.5 % EX OINT: 1.0000 "application " | TOPICAL_OINTMENT | Freq: Two times a day (BID) | CUTANEOUS | 1 refills | Status: AC

## 2017-01-14 NOTE — Progress Notes (Signed)
       Patient: Daniel Carr Male    DOB: May 16, 1935   82 y.o.   MRN: [id number] Visit Date: 01/14/2017  Today's Provider: Lelon Huh, MD   Chief Complaint  Patient presents with  . Rash   Subjective:    Rash  This is a new problem. The current episode started more than 1 month ago. The problem has been gradually worsening since onset. The affected locations include the right arm. The rash is characterized by itchiness and dryness (dark discoloration). He was exposed to nothing. Associated symptoms include rhinorrhea and shortness of breath. Pertinent negatives include no congestion, cough, diarrhea, eye pain, fatigue, fever or sore throat. Treatments tried: lotion. The treatment provided mild relief.       No Known Allergies   Current Outpatient Prescriptions:  .  aspirin 81 MG tablet, Take 81 mg by mouth daily., Disp: , Rfl:  .  brimonidine (ALPHAGAN) 0.2 % ophthalmic solution, , Disp: , Rfl:  .  Denture Care Products (DENTURE ADHESIVE) CREA, , Disp: , Rfl:  .  finasteride (PROSCAR) 5 MG tablet, TAKE ONE TABLET BY MOUTH ONCE DAILY, Disp: 30 tablet, Rfl: 12 .  ibuprofen (ADVIL,MOTRIN) 200 MG tablet, Take 1 tablet by mouth every 6 (six) hours as needed., Disp: , Rfl:  .  lovastatin (MEVACOR) 40 MG tablet, TAKE ONE TABLET BY MOUTH ONCE DAILY, Disp: 30 tablet, Rfl: 12 .  Multiple Vitamin (DAILY VITAMIN PO), Take by mouth., Disp: , Rfl:  .  SYNTHROID 125 MCG tablet, TAKE ONE TABLET BY MOUTH ONCE DAILY, Disp: 30 tablet, Rfl: 12 .  TRAVATAN Z 0.004 % SOLN ophthalmic solution, , Disp: , Rfl:   Review of Systems  Constitutional: Negative for fatigue and fever.  HENT: Positive for rhinorrhea. Negative for congestion and sore throat.   Eyes: Negative for pain.  Respiratory: Positive for shortness of breath. Negative for cough.   Gastrointestinal: Negative for diarrhea.  Skin: Positive for color change and rash.    Social History  Substance Use Topics  . Smoking status: Former  Research scientist (life sciences)  . Smokeless tobacco: Never Used     Comment: quit in 1970's  . Alcohol use No   Objective:   BP 120/72 (BP Location: Right Arm, Patient Position: Sitting, Cuff Size: Large)   Pulse 74   Temp 98.1 F (36.7 C) (Oral)   Resp 18   Wt 216 lb (98 kg)   SpO2 99% Comment: room air  BMI 28.89 kg/m   Physical Exam  About 2cm x 3cm oval patch or dry hyperpigmented eczematous skin right lateral elbow.     Assessment & Plan:      1. Eczema, unspecified type  - triamcinolone ointment (KENALOG) 0.5 %; Apply 1 application topically 2 (two) times daily.  Dispense: 30 g; Refill: 1       Lelon Huh, MD  [facility]

## 2017-02-07 ENCOUNTER — Ambulatory Visit (INDEPENDENT_AMBULATORY_CARE_PROVIDER_SITE_OTHER): Payer: Commercial Managed Care - HMO | Admitting: Podiatry

## 2017-02-07 ENCOUNTER — Encounter: Payer: Self-pay | Admitting: Podiatry

## 2017-02-07 DIAGNOSIS — Q828 Other specified congenital malformations of skin: Secondary | ICD-10-CM

## 2017-02-07 DIAGNOSIS — M79676 Pain in unspecified toe(s): Secondary | ICD-10-CM

## 2017-02-07 DIAGNOSIS — B351 Tinea unguium: Secondary | ICD-10-CM

## 2017-02-07 NOTE — Progress Notes (Signed)
He presents today to complaint of painful elongated toenails.  Objective: Toenails along thick yellow dystrophic onychomycotic and painful palpation as well as debridement. Severe hallux abductovalgus deformity and hammertoe deformities are noted bilateral.  Assessment: Pain and limb secondary to onychomycosis.  Plan: Debridement of toenails 1 through 5 bilateral.

## 2017-04-07 ENCOUNTER — Other Ambulatory Visit: Payer: Self-pay | Admitting: Family Medicine

## 2017-04-07 DIAGNOSIS — H401131 Primary open-angle glaucoma, bilateral, mild stage: Secondary | ICD-10-CM | POA: Diagnosis not present

## 2017-04-11 ENCOUNTER — Ambulatory Visit: Payer: Medicare HMO | Admitting: Podiatry

## 2017-05-02 ENCOUNTER — Other Ambulatory Visit: Payer: Self-pay | Admitting: Family Medicine

## 2017-05-02 MED ORDER — LOVASTATIN 40 MG PO TABS
40.0000 mg | ORAL_TABLET | Freq: Every day | ORAL | 4 refills | Status: DC
Start: 2017-05-02 — End: 2018-05-08

## 2017-05-02 NOTE — Telephone Encounter (Signed)
Requesting 90 day supply.

## 2017-05-02 NOTE — Telephone Encounter (Signed)
[facility] faxed a request for a 90-day supply on the following medication. Thanks CC   lovastatin (MEVACOR) 40 MG tablet  Take one tablet by mouth once daily.

## 2017-06-08 ENCOUNTER — Encounter: Payer: Self-pay | Admitting: Family Medicine

## 2017-06-08 ENCOUNTER — Ambulatory Visit (INDEPENDENT_AMBULATORY_CARE_PROVIDER_SITE_OTHER): Payer: Medicare HMO | Admitting: Family Medicine

## 2017-06-08 ENCOUNTER — Ambulatory Visit (INDEPENDENT_AMBULATORY_CARE_PROVIDER_SITE_OTHER): Payer: Medicare HMO

## 2017-06-08 VITALS — BP 146/82 | HR 76 | Temp 98.7°F | Ht 73.0 in | Wt 215.0 lb

## 2017-06-08 VITALS — BP 142/82

## 2017-06-08 DIAGNOSIS — M069 Rheumatoid arthritis, unspecified: Secondary | ICD-10-CM

## 2017-06-08 DIAGNOSIS — R9431 Abnormal electrocardiogram [ECG] [EKG]: Secondary | ICD-10-CM | POA: Diagnosis not present

## 2017-06-08 DIAGNOSIS — Z0000 Encounter for general adult medical examination without abnormal findings: Secondary | ICD-10-CM

## 2017-06-08 DIAGNOSIS — E039 Hypothyroidism, unspecified: Secondary | ICD-10-CM

## 2017-06-08 DIAGNOSIS — I10 Essential (primary) hypertension: Secondary | ICD-10-CM

## 2017-06-08 DIAGNOSIS — E7800 Pure hypercholesterolemia, unspecified: Secondary | ICD-10-CM | POA: Diagnosis not present

## 2017-06-08 NOTE — Progress Notes (Signed)
Subjective:   Daniel Carr is a 82 y.o. male who presents for Medicare Annual/Subsequent preventive examination.  Review of Systems:  N/A  Cardiac Risk Factors include: advanced age (>60men, >8 women);dyslipidemia;hypertension;male gender     Objective:    Vitals: BP (!) 146/82   Pulse 76   Temp 98.7 F (37.1 C) (Oral)   Ht 6\' 1"  (1.854 m)   Wt 215 lb (97.5 kg)   BMI 28.37 kg/m   Body mass index is 28.37 kg/m.  Tobacco History  Smoking Status  . Former Smoker  Smokeless Tobacco  . Never Used    Comment: quit in 1970's     Counseling given: Not Answered   Past Medical History:  Diagnosis Date  . childhood infection    chicken pox, measels, and mumps  . Hyperlipidemia   . Hypertension   . Sinus problem   . Ulnar neuropathy    Past Surgical History:  Procedure Laterality Date  . APPENDECTOMY    . CATARACT EXTRACTION Bilateral   . KNEE SURGERY  10/04/2011  . RETINAL DETACHMENT REPAIR W/ SCLERAL BUCKLE LE     Family History  Problem Relation Age of Onset  . Stroke Sister   . Hypertension Mother   . Stroke Father   . Hypertension Father    History  Sexual Activity  . Sexual activity: Yes    Outpatient Encounter Prescriptions as of 06/08/2017  Medication Sig  . aspirin 81 MG tablet Take 81 mg by mouth daily.  . brimonidine (ALPHAGAN) 0.2 % ophthalmic solution   . finasteride (PROSCAR) 5 MG tablet TAKE ONE TABLET BY MOUTH ONCE DAILY  . ibuprofen (ADVIL,MOTRIN) 200 MG tablet Take 1 tablet by mouth every 6 (six) hours as needed.  . lovastatin (MEVACOR) 40 MG tablet Take 1 tablet (40 mg total) by mouth daily.  . Multiple Vitamin (DAILY VITAMIN PO) Take by mouth.  . SYNTHROID 125 MCG tablet TAKE ONE TABLET BY MOUTH ONCE DAILY  . TRAVATAN Z 0.004 % SOLN ophthalmic solution   . [DISCONTINUED] Denture Care Products (DENTURE ADHESIVE) CREA    No facility-administered encounter medications on file as of 06/08/2017.     Activities of Daily Living In your  present state of health, do you have any difficulty performing the following activities: 06/08/2017 06/08/2016  Hearing? N N  Vision? N N  Difficulty concentrating or making decisions? N N  Walking or climbing stairs? Y Y  Dressing or bathing? N N  Doing errands, shopping? N N  Preparing Food and eating ? N -  Using the Toilet? N -  In the past six months, have you accidently leaked urine? N -  Do you have problems with loss of bowel control? N -  Managing your Medications? N -  Managing your Finances? N -  Housekeeping or managing your Housekeeping? N -  Some recent data might be hidden    Patient Care Team: [name], MD as PCP - General (Family Medicine) Dingeldein, Remo Lipps, MD as Consulting Physician (Ophthalmology)   Assessment:     Exercise Activities and Dietary recommendations Current Exercise Habits: The patient does not participate in regular exercise at present, Exercise limited by: None identified  Goals    . Increase water intake          Recommend increasing water intake to 4-6 glasses a day.      Fall Risk Fall Risk  06/08/2017 06/08/2016 06/06/2015  Falls in the past year? No No No  Depression Screen PHQ 2/9 Scores 06/08/2017 06/08/2017 06/08/2016 06/06/2015  PHQ - 2 Score 0 0 0 0  PHQ- 9 Score 2 - 1 -    Cognitive Function     6CIT Screen 06/08/2017  What Year? 0 points  What month? 0 points  What time? 0 points  Count back from 20 0 points  Months in reverse 0 points  Repeat phrase 0 points  Total Score 0    Immunization History  Administered Date(s) Administered  . Influenza, High Dose Seasonal PF 10/07/2015, 10/16/2016  . Influenza-Unspecified 09/19/2014  . Pneumococcal Conjugate-13 06/03/2014  . Pneumococcal Polysaccharide-23 04/16/2003  . Td 03/04/1997  . Tdap 11/09/2011  . Zoster 05/09/2008, 06/03/2014   Screening Tests Health Maintenance  Topic Date Due  . INFLUENZA VACCINE  07/27/2017  . TETANUS/TDAP  11/08/2021  . PNA vac  Low Risk Adult  Completed      Plan:  I have personally reviewed and addressed the Medicare Annual Wellness questionnaire and have noted the following in the patient's chart:  A. Medical and social history B. Use of alcohol, tobacco or illicit drugs  C. Current medications and supplements D. Functional ability and status E.  Nutritional status F.  Physical activity G. Advance directives H. List of other physicians I.  Hospitalizations, surgeries, and ER visits in previous 12 months J.  [place] such as hearing and vision if needed, cognitive and depression L. Referrals and appointments - none  In addition, I have reviewed and discussed with patient certain preventive protocols, quality metrics, and best practice recommendations. A written personalized care plan for preventive services as well as general preventive health recommendations were provided to patient.  See attached scanned questionnaire for additional information.   Signed,  Fabio Neighbors, LPN Nurse Health Advisor   MD Recommendations: None.

## 2017-06-08 NOTE — Patient Instructions (Signed)
Preventive Care 80 Years and Older, Male Preventive care refers to lifestyle choices and visits with your health care provider that can promote health and wellness. What does preventive care include?  A yearly physical exam. This is also called an annual well check.  Dental exams once or twice a year.  Routine eye exams. Ask your health care provider how often you should have your eyes checked.  Personal lifestyle choices, including: ? Daily care of your teeth and gums. ? Regular physical activity. ? Eating a healthy diet. ? Avoiding tobacco and drug use. ? Limiting alcohol use. ? Practicing safe sex. ? Taking low doses of aspirin every day. ? Taking vitamin and mineral supplements as recommended by your health care provider. What happens during an annual well check? The services and screenings done by your health care provider during your annual well check will depend on your age, overall health, lifestyle risk factors, and family history of disease. Counseling Your health care provider may ask you questions about your:  Alcohol use.  Tobacco use.  Drug use.  Emotional well-being.  Home and relationship well-being.  Sexual activity.  Eating habits.  History of falls.  Memory and ability to understand (cognition).  Work and work environment.  Screening You may have the following tests or measurements:  Height, weight, and BMI.  Blood pressure.  Lipid and cholesterol levels. These may be checked every 5 years, or more frequently if you are over 50 years old.  Skin check.  Lung cancer screening. You may have this screening every year starting at age 55 if you have a 30-pack-year history of smoking and currently smoke or have quit within the past 15 years.  Fecal occult blood test (FOBT) of the stool. You may have this test every year starting at age 50.  Flexible sigmoidoscopy or colonoscopy. You may have a sigmoidoscopy every 5 years or a colonoscopy every 10  years starting at age 50.  Prostate cancer screening. Recommendations will vary depending on your family history and other risks.  Hepatitis C blood test.  Hepatitis B blood test.  Sexually transmitted disease (STD) testing.  Diabetes screening. This is done by checking your blood sugar (glucose) after you have not eaten for a while (fasting). You may have this done every 1-3 years.  Abdominal aortic aneurysm (AAA) screening. You may need this if you are a current or former smoker.  Osteoporosis. You may be screened starting at age 70 if you are at high risk.  Talk with your health care provider about your test results, treatment options, and if necessary, the need for more tests. Vaccines Your health care provider may recommend certain vaccines, such as:  Influenza vaccine. This is recommended every year.  Tetanus, diphtheria, and acellular pertussis (Tdap, Td) vaccine. You may need a Td booster every 10 years.  Varicella vaccine. You may need this if you have not been vaccinated.  Zoster vaccine. You may need this after age 60.  Measles, mumps, and rubella (MMR) vaccine. You may need at least one dose of MMR if you were born in 1957 or later. You may also need a second dose.  Pneumococcal 13-valent conjugate (PCV13) vaccine. One dose is recommended after age 65.  Pneumococcal polysaccharide (PPSV23) vaccine. One dose is recommended after age 65.  Meningococcal vaccine. You may need this if you have certain conditions.  Hepatitis A vaccine. You may need this if you have certain conditions or if you travel or work in places where you   may be exposed to hepatitis A.  Hepatitis B vaccine. You may need this if you have certain conditions or if you travel or work in places where you may be exposed to hepatitis B.  Haemophilus influenzae type b (Hib) vaccine. You may need this if you have certain risk factors.  Talk to your health care provider about which screenings and vaccines  you need and how often you need them. This information is not intended to replace advice given to you by your health care provider. Make sure you discuss any questions you have with your health care provider. Document Released: 01/09/2016 Document Revised: 09/01/2016 Document Reviewed: 10/14/2015 Elsevier Interactive Patient Education  2017 [employer].

## 2017-06-08 NOTE — Patient Instructions (Addendum)
[name] , Thank you for taking time to come for your Medicare Wellness Visit. I appreciate your ongoing commitment to your health goals. Please review the following plan we discussed and let me know if I can assist you in the future.   Screening recommend/ations/referrals: Colonoscopy: completed  Recommended yearly ophthalmology/optometry visit for glaucoma screening and checkup Recommended yearly dental visit for hygiene and checkup  Vaccinations: Influenza vaccine: up to date, due 08/2017 Pneumococcal vaccine: completed series Tdap vaccine: completed 11/09/11, due 10/2021 Shingles vaccine: completed 06/03/14  Advanced directives: Pt to check records first to see if he has completed this, then will advise office.   Conditions/risks identified: Recommend increasing water intake to 4-6 glasses a day.  Next appointment: None, need to schedule 1 year AWV.  Preventive Care 80 Years and Older, Male Preventive care refers to lifestyle choices and visits with your health care provider that can promote health and wellness. What does preventive care include?  A yearly physical exam. This is also called an annual well check.  Dental exams once or twice a year.  Routine eye exams. Ask your health care provider how often you should have your eyes checked.  Personal lifestyle choices, including:  Daily care of your teeth and gums.  Regular physical activity.  Eating a healthy diet.  Avoiding tobacco and drug use.  Limiting alcohol use.  Practicing safe sex.  Taking low doses of aspirin every day.  Taking vitamin and mineral supplements as recommended by your health care provider. What happens during an annual well check? The services and screenings done by your health care provider during your annual well check will depend on your age, overall health, lifestyle risk factors, and family history of disease. Counseling  Your health care provider may ask you questions about  your:  Alcohol use.  Tobacco use.  Drug use.  Emotional well-being.  Home and relationship well-being.  Sexual activity.  Eating habits.  History of falls.  Memory and ability to understand (cognition).  Work and work Statistician. Screening  You may have the following tests or measurements:  Height, weight, and BMI.  Blood pressure.  Lipid and cholesterol levels. These may be checked every 5 years, or more frequently if you are over 36 years old.  Skin check.  Lung cancer screening. You may have this screening every year starting at age 85 if you have a 30-pack-year history of smoking and currently smoke or have quit within the past 15 years.  Fecal occult blood test (FOBT) of the stool. You may have this test every year starting at age 72.  Flexible sigmoidoscopy or colonoscopy. You may have a sigmoidoscopy every 5 years or a colonoscopy every 10 years starting at age 82.  Prostate cancer screening. Recommendations will vary depending on your family history and other risks.  Hepatitis C blood test.  Hepatitis B blood test.  Sexually transmitted disease (STD) testing.  Diabetes screening. This is done by checking your blood sugar (glucose) after you have not eaten for a while (fasting). You may have this done every 1-3 years.  Abdominal aortic aneurysm (AAA) screening. You may need this if you are a current or former smoker.  Osteoporosis. You may be screened starting at age 12 if you are at high risk. Talk with your health care provider about your test results, treatment options, and if necessary, the need for more tests. Vaccines  Your health care provider may recommend certain vaccines, such as:  Influenza vaccine. This is recommended  every year.  Tetanus, diphtheria, and acellular pertussis (Tdap, Td) vaccine. You may need a Td booster every 10 years.  Zoster vaccine. You may need this after age 43.  Pneumococcal 13-valent conjugate (PCV13) vaccine.  One dose is recommended after age 83.  Pneumococcal polysaccharide (PPSV23) vaccine. One dose is recommended after age 39. Talk to your health care provider about which screenings and vaccines you need and how often you need them. This information is not intended to replace advice given to you by your health care provider. Make sure you discuss any questions you have with your health care provider. Document Released: 01/09/2016 Document Revised: 09/01/2016 Document Reviewed: 10/14/2015 Elsevier Interactive Patient Education  2017 [employer] Prevention in the Home Falls can cause injuries. They can happen to people of all ages. There are many things you can do to make your home safe and to help prevent falls. What can I do on the outside of my home?  Regularly fix the edges of walkways and driveways and fix any cracks.  Remove anything that might make you trip as you walk through a door, such as a raised step or threshold.  Trim any bushes or trees on the path to your home.  Use bright outdoor lighting.  Clear any walking paths of anything that might make someone trip, such as rocks or tools.  Regularly check to see if handrails are loose or broken. Make sure that both sides of any steps have handrails.  Any raised decks and porches should have guardrails on the edges.  Have any leaves, snow, or ice cleared regularly.  Use sand or salt on walking paths during winter.  Clean up any spills in your garage right away. This includes oil or grease spills. What can I do in the bathroom?  Use night lights.  Install grab bars by the toilet and in the tub and shower. Do not use towel bars as grab bars.  Use non-skid mats or decals in the tub or shower.  If you need to sit down in the shower, use a plastic, non-slip stool.  Keep the floor dry. Clean up any water that spills on the floor as soon as it happens.  Remove soap buildup in the tub or shower regularly.  Attach bath  mats securely with double-sided non-slip rug tape.  Do not have throw rugs and other things on the floor that can make you trip. What can I do in the bedroom?  Use night lights.  Make sure that you have a light by your bed that is easy to reach.  Do not use any sheets or blankets that are too big for your bed. They should not hang down onto the floor.  Have a firm chair that has side arms. You can use this for support while you get dressed.  Do not have throw rugs and other things on the floor that can make you trip. What can I do in the kitchen?  Clean up any spills right away.  Avoid walking on wet floors.  Keep items that you use a lot in easy-to-reach places.  If you need to reach something above you, use a strong step stool that has a grab bar.  Keep electrical cords out of the way.  Do not use floor polish or wax that makes floors slippery. If you must use wax, use non-skid floor wax.  Do not have throw rugs and other things on the floor that can make you trip. What  can I do with my stairs?  Do not leave any items on the stairs.  Make sure that there are handrails on both sides of the stairs and use them. Fix handrails that are broken or loose. Make sure that handrails are as long as the stairways.  Check any carpeting to make sure that it is firmly attached to the stairs. Fix any carpet that is loose or worn.  Avoid having throw rugs at the top or bottom of the stairs. If you do have throw rugs, attach them to the floor with carpet tape.  Make sure that you have a light switch at the top of the stairs and the bottom of the stairs. If you do not have them, ask someone to add them for you. What else can I do to help prevent falls?  Wear shoes that:  Do not have high heels.  Have rubber bottoms.  Are comfortable and fit you well.  Are closed at the toe. Do not wear sandals.  If you use a stepladder:  Make sure that it is fully opened. Do not climb a closed  stepladder.  Make sure that both sides of the stepladder are locked into place.  Ask someone to hold it for you, if possible.  Clearly mark and make sure that you can see:  Any grab bars or handrails.  First and last steps.  Where the edge of each step is.  Use tools that help you move around (mobility aids) if they are needed. These include:  Canes.  Walkers.  Scooters.  Crutches.  Turn on the lights when you go into a dark area. Replace any light bulbs as soon as they burn out.  Set up your furniture so you have a clear path. Avoid moving your furniture around.  If any of your floors are uneven, fix them.  If there are any pets around you, be aware of where they are.  Review your medicines with your doctor. Some medicines can make you feel dizzy. This can increase your chance of falling. Ask your doctor what other things that you can do to help prevent falls. This information is not intended to replace advice given to you by your health care provider. Make sure you discuss any questions you have with your health care provider. Document Released: 10/09/2009 Document Revised: 05/20/2016 Document Reviewed: 01/17/2015 Elsevier Interactive Patient Education  2017 [employer].

## 2017-06-08 NOTE — Progress Notes (Signed)
Patient: Daniel Carr, Male    DOB: [birth year], 82 y.o.   MRN: [id number] Visit Date: 06/08/2017  Today's Provider: Lelon Huh, MD   Chief Complaint  Patient presents with  . Annual Exam  . Hypertension    follow up  . Hypothyroidism    follow up  . Hyperlipidemia    follow up   Subjective:    Annual physical exam Daniel Carr is a 82 y.o. male who presents today for health maintenance and complete physical. He feels well. He reports never exercising. He reports he is sleeping fairly well.  -----------------------------------------------------------------  Hypertension, follow-up:  BP Readings from Last 3 Encounters:  06/08/17 (!) 146/82  01/14/17 120/72  06/08/16 106/72    He was last seen for hypertension 1 years ago.  BP at that visit was 106/72. Management since that visit includes no changes. He reports good compliance with treatment.  He is not having side effects.  He is not exercising. He is adherent to low salt diet.   Outside blood pressures are checked occasionally. He is experiencing dyspnea.  Patient denies chest pain, chest pressure/discomfort, claudication, exertional chest pressure/discomfort, fatigue, irregular heart beat, lower extremity edema, near-syncope, orthopnea, palpitations, paroxysmal nocturnal dyspnea, syncope and tachypnea.   Cardiovascular risk factors include advanced age (older than 15 for men, 58 for women), dyslipidemia, hypertension and male gender.  Use of agents associated with hypertension: NSAIDS.     Weight trend: stable Wt Readings from Last 3 Encounters:  06/08/17 215 lb (97.5 kg)  01/14/17 216 lb (98 kg)  06/08/16 212 lb (96.2 kg)    Current diet: in general, a "healthy" diet    ------------------------------------------------------------------------ Follow up of Hypothyroidism:  Patient was last seen for this problem 1 year ago and no changes were made. Patient reports good compliance with treatment and  good tolerance.  Lab Results  Component Value Date   TSH 2.290 06/08/2016       Lipid/Cholesterol, Follow-up:   Last seen for this 2 years ago.  Management changes since that visit include none. . Last Lipid Panel:    Component Value Date/Time   CHOL 188 06/06/2015 1044   TRIG 82 06/06/2015 1044   HDL 81 06/06/2015 1044   CHOLHDL 2.3 06/06/2015 1044   [place] 91 06/06/2015 1044    Risk factors for vascular disease include hypercholesterolemia and hypertension  He reports good compliance with treatment. He is not having side effects.  Current symptoms include none and have been stable. Weight trend: stable Prior visit with dietician: no Current diet: in general, a "healthy" diet   Current exercise: none  Wt Readings from Last 3 Encounters:  06/08/17 215 lb (97.5 kg)  01/14/17 216 lb (98 kg)  06/08/16 212 lb (96.2 kg)    -------------------------------------------------------------------   Review of Systems  Constitutional: Negative for appetite change, chills, fatigue and fever.  HENT: Positive for rhinorrhea and sinus pressure. Negative for congestion, ear pain, hearing loss, nosebleeds and trouble swallowing.   Eyes: Positive for redness and itching. Negative for pain and visual disturbance.  Respiratory: Positive for shortness of breath. Negative for cough and chest tightness.   Cardiovascular: Negative for chest pain, palpitations and leg swelling.  Gastrointestinal: Negative for abdominal pain, blood in stool, constipation, diarrhea, nausea and vomiting.  Endocrine: Negative for polydipsia, polyphagia and polyuria.  Genitourinary: Positive for difficulty urinating. Negative for dysuria and flank pain.  Musculoskeletal: Positive for arthralgias and back pain. Negative for joint swelling, myalgias and  neck stiffness.  Skin: Negative for color change, rash and wound.  Neurological: Negative for dizziness, tremors, seizures, speech difficulty, weakness,  light-headedness and headaches.  Psychiatric/Behavioral: Negative for behavioral problems, confusion, decreased concentration, dysphoric mood and sleep disturbance. The patient is not nervous/anxious.   All other systems reviewed and are negative.   Social History      He  reports that he has quit smoking. He has never used smokeless tobacco. He reports that he does not drink alcohol or use drugs.       Social History   Social History  . Marital status: Married    Spouse name: N/A  . Number of children: 1  . Years of education: college   Occupational History  . Retired    Social History Main Topics  . Smoking status: Former Research scientist (life sciences)  . Smokeless tobacco: Never Used     Comment: quit in 1970's  . Alcohol use No  . Drug use: No  . Sexual activity: Yes   Other Topics Concern  . Not on file   Social History Narrative  . No narrative on file    Past Medical History:  Diagnosis Date  . childhood infection    chicken pox, measels, and mumps  . Hyperlipidemia   . Hypertension   . Sinus problem   . Ulnar neuropathy      Patient Active Problem List   Diagnosis Date Noted  . Callus of foot 07/27/2016  . Nail dystrophy 07/27/2016  . Arthropathia 06/09/2015  . Neuropathic ulnar nerve 06/09/2015  . Glaucoma   . Rheumatoid arthritis ([place])   . Hypothyroid   . Pure hypercholesterolemia   . Hypertension, benign   . Arthritis, multiple joint involvement   . BPH (benign prostatic hyperplasia)   . ED (erectile dysfunction)   . Nocturia associated with benign prostatic hypertrophy     Past Surgical History:  Procedure Laterality Date  . APPENDECTOMY    . CATARACT EXTRACTION Bilateral   . KNEE SURGERY  10/04/2011  . RETINAL DETACHMENT REPAIR W/ SCLERAL BUCKLE LE      Family History        Family Status  Relation Status  . Sister Deceased  . Mother Deceased  . Father Deceased  . Sister Alive  . Brother Deceased  . Brother Deceased  . Sister Alive  . Brother  Deceased        His family history includes Hypertension in his father and mother; Stroke in his father and sister.     No Known Allergies   Current Outpatient Prescriptions:  .  aspirin 81 MG tablet, Take 81 mg by mouth daily., Disp: , Rfl:  .  brimonidine (ALPHAGAN) 0.2 % ophthalmic solution, , Disp: , Rfl:  .  finasteride (PROSCAR) 5 MG tablet, TAKE ONE TABLET BY MOUTH ONCE DAILY, Disp: 30 tablet, Rfl: 12 .  ibuprofen (ADVIL,MOTRIN) 200 MG tablet, Take 1 tablet by mouth every 6 (six) hours as needed., Disp: , Rfl:  .  lovastatin (MEVACOR) 40 MG tablet, Take 1 tablet (40 mg total) by mouth daily., Disp: 90 tablet, Rfl: 4 .  Multiple Vitamin (DAILY VITAMIN PO), Take by mouth., Disp: , Rfl:  .  SYNTHROID 125 MCG tablet, TAKE ONE TABLET BY MOUTH ONCE DAILY, Disp: 30 tablet, Rfl: 12 .  TRAVATAN Z 0.004 % SOLN ophthalmic solution, , Disp: , Rfl:    Patient Care Team: [name], MD as PCP - General (Family Medicine) Dingeldein, Remo Lipps, MD as Consulting Physician (  Ophthalmology)      Objective:   Vitals: Vital Signs - Last Recorded  Most recent update: 06/08/2017 1:40 PM by Fabio Neighbors, LPN  BP    767/34     Pulse  76     Temp  98.7 F (37.1 C) (Oral)     Ht  6\' 1"  (1.854 m)     Wt  215 lb (97.5 kg)      BMI  28.37 kg/m           Physical Exam   General Appearance:    Alert, cooperative, no distress, appears stated age  Head:    Normocephalic, without obvious abnormality, atraumatic  Eyes:    PERRL, conjunctiva/corneas clear, EOM's intact, fundi    benign, both eyes       Ears:    Normal TM's and external ear canals, both ears  Nose:   Nares normal, septum midline, mucosa normal, no drainage   or sinus tenderness  Throat:   Lips, mucosa, and tongue normal; teeth and gums normal  Neck:   Supple, symmetrical, trachea midline, no adenopathy;       thyroid:  No enlargement/tenderness/nodules; no carotid   bruit or JVD  Back:     Symmetric, no  curvature, ROM normal, no CVA tenderness  Lungs:     Clear to auscultation bilaterally, respirations unlabored  Chest wall:    No tenderness or deformity  Heart:    Regular rate and rhythm, S1 and S2 normal, III/VI SM RUSB, rub   or gallop  Abdomen:     Soft, non-tender, bowel sounds active all four quadrants,    no masses, no organomegaly  Genitalia:    deferred  Rectal:    deferred  Extremities:   Extremities normal, atraumatic, no cyanosis or edema  Pulses:   2+ and symmetric all extremities  Skin:   Skin color, texture, turgor normal, no rashes or lesions  Lymph nodes:   Cervical, supraclavicular, and axillary nodes normal  Neurologic:   CNII-XII intact. Normal strength, sensation and reflexes      throughout   Depression Screen PHQ 2/9 Scores 06/08/2017 06/08/2017 06/08/2016 06/06/2015  PHQ - 2 Score 0 0 0 0  PHQ- 9 Score 2 - 1 -      Assessment & Plan:     Routine Health Maintenance and Physical Exam  Exercise Activities and Dietary recommendations Goals    . Increase water intake          Recommend increasing water intake to 4-6 glasses a day.       Immunization History  Administered Date(s) Administered  . Influenza, High Dose Seasonal PF 10/07/2015, 10/16/2016  . Influenza-Unspecified 09/19/2014  . Pneumococcal Conjugate-13 06/03/2014  . Pneumococcal Polysaccharide-23 04/16/2003  . Td 03/04/1997  . Tdap 11/09/2011  . Zoster 05/09/2008, 06/03/2014    Health Maintenance  Topic Date Due  . INFLUENZA VACCINE  07/27/2017  . TETANUS/TDAP  11/08/2021  . PNA vac Low Risk Adult  Completed     Discussed health benefits of physical activity, and encouraged him to engage in regular exercise appropriate for his age and condition.    -------------------------------------------------------------------- 1. Annual physical exam Generally doing well.   2. Hypertension, benign Well controlled.  Continue current medications.   - Comprehensive metabolic panel - EKG  [id number]  3. Hypothyroidism, unspecified type  - TSH  4. Rheumatoid arthritis involving multiple sites, unspecified rheumatoid factor presence ([place])   5. Pure hypercholesterolemia He is tolerating lovastatin well  with no adverse effects.   - Comprehensive metabolic panel - Lipid panel  6. Abnormal EKG Compared to EKG of 06/08/2016, there is new t wave flattening of leads II, III, aVF, V3, V4, V5 and V6. Only symptom is mild dyspnea on exertion which is long-standing, but worsening since last visit. Consider multiple cardiac risk factors will refer to cardiology.   - Ambulatory referral to Cardiology    Lelon Huh, MD  [facility]

## 2017-06-09 DIAGNOSIS — I10 Essential (primary) hypertension: Secondary | ICD-10-CM | POA: Diagnosis not present

## 2017-06-09 DIAGNOSIS — E039 Hypothyroidism, unspecified: Secondary | ICD-10-CM | POA: Diagnosis not present

## 2017-06-09 DIAGNOSIS — E7800 Pure hypercholesterolemia, unspecified: Secondary | ICD-10-CM | POA: Diagnosis not present

## 2017-06-10 ENCOUNTER — Telehealth: Payer: Self-pay | Admitting: Family Medicine

## 2017-06-10 LAB — COMPREHENSIVE METABOLIC PANEL
ALT: 11 IU/L (ref 0–44)
AST: 23 IU/L (ref 0–40)
Albumin/Globulin Ratio: 1.6 (ref 1.2–2.2)
Albumin: 3.9 g/dL (ref 3.5–4.7)
Alkaline Phosphatase: 37 IU/L — ABNORMAL LOW (ref 39–117)
BUN/Creatinine Ratio: 11 (ref 10–24)
BUN: 10 mg/dL (ref 8–27)
Bilirubin Total: 0.7 mg/dL (ref 0.0–1.2)
CO2: 29 mmol/L (ref 20–29)
Calcium: 8.9 mg/dL (ref 8.6–10.2)
Chloride: 102 mmol/L (ref 96–106)
Creatinine, Ser: 0.93 mg/dL (ref 0.76–1.27)
GFR calc Af Amer: 89 mL/min/{1.73_m2} (ref >59)
GFR calc non Af Amer: 77 mL/min/{1.73_m2} (ref >59)
Globulin, Total: 2.4 g/dL (ref 1.5–4.5)
Glucose: 85 mg/dL (ref 65–99)
Potassium: 4.2 mmol/L (ref 3.5–5.2)
Sodium: 142 mmol/L (ref 134–144)
Total Protein: 6.3 g/dL (ref 6.0–8.5)

## 2017-06-10 LAB — TSH: TSH: 3 u[IU]/mL (ref 0.450–4.500)

## 2017-06-10 LAB — LIPID PANEL
Chol/HDL Ratio: 2 ratio (ref 0.0–5.0)
Cholesterol, Total: 147 mg/dL (ref 100–199)
HDL: 72 mg/dL (ref >39)
LDL Calculated: 63 mg/dL (ref 0–99)
Triglycerides: 60 mg/dL (ref 0–149)
VLDL Cholesterol Cal: 12 mg/dL (ref 5–40)

## 2017-06-10 NOTE — Telephone Encounter (Signed)
Pt is returning call.  CB#[phone]/MW

## 2017-06-10 NOTE — Telephone Encounter (Signed)
Patient was notified.

## 2017-06-15 DIAGNOSIS — E079 Disorder of thyroid, unspecified: Secondary | ICD-10-CM | POA: Diagnosis not present

## 2017-06-15 DIAGNOSIS — R0602 Shortness of breath: Secondary | ICD-10-CM | POA: Diagnosis not present

## 2017-06-15 DIAGNOSIS — R600 Localized edema: Secondary | ICD-10-CM | POA: Diagnosis not present

## 2017-06-15 DIAGNOSIS — R011 Cardiac murmur, unspecified: Secondary | ICD-10-CM | POA: Diagnosis not present

## 2017-06-15 DIAGNOSIS — R9431 Abnormal electrocardiogram [ECG] [EKG]: Secondary | ICD-10-CM | POA: Diagnosis not present

## 2017-06-15 DIAGNOSIS — I208 Other forms of angina pectoris: Secondary | ICD-10-CM | POA: Diagnosis not present

## 2017-07-20 DIAGNOSIS — R9431 Abnormal electrocardiogram [ECG] [EKG]: Secondary | ICD-10-CM | POA: Diagnosis not present

## 2017-07-20 DIAGNOSIS — R0602 Shortness of breath: Secondary | ICD-10-CM | POA: Diagnosis not present

## 2017-07-20 DIAGNOSIS — R600 Localized edema: Secondary | ICD-10-CM | POA: Diagnosis not present

## 2017-07-20 DIAGNOSIS — I208 Other forms of angina pectoris: Secondary | ICD-10-CM | POA: Diagnosis not present

## 2017-07-20 DIAGNOSIS — R011 Cardiac murmur, unspecified: Secondary | ICD-10-CM | POA: Diagnosis not present

## 2017-07-21 HISTORY — PX: NM MYOVIEW (ARMC HX): HXRAD1857

## 2017-07-28 ENCOUNTER — Telehealth: Payer: Self-pay | Admitting: Family Medicine

## 2017-07-28 DIAGNOSIS — M1712 Unilateral primary osteoarthritis, left knee: Secondary | ICD-10-CM | POA: Insufficient documentation

## 2017-07-28 DIAGNOSIS — M25562 Pain in left knee: Secondary | ICD-10-CM | POA: Diagnosis not present

## 2017-07-28 DIAGNOSIS — J3089 Other allergic rhinitis: Secondary | ICD-10-CM | POA: Diagnosis not present

## 2017-07-28 DIAGNOSIS — I519 Heart disease, unspecified: Secondary | ICD-10-CM

## 2017-07-28 DIAGNOSIS — J301 Allergic rhinitis due to pollen: Secondary | ICD-10-CM | POA: Diagnosis not present

## 2017-07-28 NOTE — Telephone Encounter (Signed)
Pt called wanting to know if Dr. Clayborn Bigness has sent any test results to Dr. Caryn Section.  Stress test/  Pt was wanting to know.  Thanks [employer]

## 2017-07-29 ENCOUNTER — Encounter: Payer: Self-pay | Admitting: Family Medicine

## 2017-07-29 DIAGNOSIS — I519 Heart disease, unspecified: Secondary | ICD-10-CM | POA: Insufficient documentation

## 2017-07-29 NOTE — Telephone Encounter (Signed)
Please advise 

## 2017-08-26 DIAGNOSIS — M1712 Unilateral primary osteoarthritis, left knee: Secondary | ICD-10-CM | POA: Diagnosis not present

## 2017-09-02 ENCOUNTER — Ambulatory Visit (INDEPENDENT_AMBULATORY_CARE_PROVIDER_SITE_OTHER): Payer: Medicare HMO | Admitting: Family Medicine

## 2017-09-02 ENCOUNTER — Other Ambulatory Visit: Payer: Self-pay | Admitting: Family Medicine

## 2017-09-02 DIAGNOSIS — Z23 Encounter for immunization: Secondary | ICD-10-CM

## 2017-09-02 MED ORDER — SILDENAFIL CITRATE 20 MG PO TABS: ORAL_TABLET | ORAL | 5 refills | Status: DC

## 2017-10-07 DIAGNOSIS — H401131 Primary open-angle glaucoma, bilateral, mild stage: Secondary | ICD-10-CM | POA: Diagnosis not present

## 2017-11-08 ENCOUNTER — Ambulatory Visit: Payer: Medicare HMO | Admitting: Family Medicine

## 2017-11-08 ENCOUNTER — Encounter: Payer: Self-pay | Admitting: Family Medicine

## 2017-11-08 VITALS — BP 138/72 | HR 78 | Temp 98.0°F | Resp 16 | Ht 73.0 in | Wt 207.0 lb

## 2017-11-08 DIAGNOSIS — E7800 Pure hypercholesterolemia, unspecified: Secondary | ICD-10-CM

## 2017-11-08 DIAGNOSIS — E039 Hypothyroidism, unspecified: Secondary | ICD-10-CM

## 2017-11-08 DIAGNOSIS — I10 Essential (primary) hypertension: Secondary | ICD-10-CM

## 2017-11-08 DIAGNOSIS — M069 Rheumatoid arthritis, unspecified: Secondary | ICD-10-CM | POA: Diagnosis not present

## 2017-11-08 NOTE — Progress Notes (Signed)
Patient: Daniel Carr Male    DOB: 1935/12/17   82 y.o.   MRN: [id number] Visit Date: 11/08/2017  Today's Provider: Lelon Huh, MD   Chief Complaint  Patient presents with  . Hypertension  . Hyperlipidemia  . Hypothyroidism  . Rheumatoid Arthritis   Subjective:    HPI   Hypertension, follow-up:  BP Readings from Last 3 Encounters:  11/08/17 138/72  06/08/17 (!) 142/82  06/08/17 (!) 146/82    He was last seen for hypertension 5 months ago.  BP at that visit was 146/82 Management since that visit includes; labs checked, no changes.He reports good compliance with treatment. He is not having side effects.  He is not exercising. He is adherent to low salt diet.   Outside blood pressures are not being checked. He is experiencing none.  Patient denies exertional chest pressure/discomfort, lower extremity edema and palpitations.   Cardiovascular risk factors include dyslipidemia.     Lipid/Cholesterol, Follow-up:   Last seen for this 5 months ago.  Management since that visit includes; labs checked, no cahnges.  Last Lipid Panel:    Component Value Date/Time   CHOL 147 06/09/2017 0921   TRIG 60 06/09/2017 0921   HDL 72 06/09/2017 0921   CHOLHDL 2.0 06/09/2017 0921   LDLCALC 63 06/09/2017 0921    He reports good compliance with treatment. He is not having side effects.   Wt Readings from Last 3 Encounters:  11/08/17 207 lb (93.9 kg)  06/08/17 215 lb (97.5 kg)  01/14/17 216 lb (98 kg)    Hypothyroidism, unspecified type From 06/08/2017-labs checked, no changes.   Lab Results  Component Value Date   TSH 3.000 06/09/2017    Rheumatoid arthritis involving multiple sites, unspecified rheumatoid factor presence ([place]) From 06/08/2017-no changes.     No Known Allergies   Current Outpatient Medications:  .  aspirin 81 MG tablet, Take 81 mg by mouth daily., Disp: , Rfl:  .  brimonidine (ALPHAGAN) 0.2 % ophthalmic solution, , Disp: , Rfl:  .   finasteride (PROSCAR) 5 MG tablet, TAKE ONE TABLET BY MOUTH ONCE DAILY, Disp: 30 tablet, Rfl: 12 .  ibuprofen (ADVIL,MOTRIN) 200 MG tablet, Take 1 tablet by mouth every 6 (six) hours as needed., Disp: , Rfl:  .  lovastatin (MEVACOR) 40 MG tablet, Take 1 tablet (40 mg total) by mouth daily., Disp: 90 tablet, Rfl: 4 .  Multiple Vitamin (DAILY VITAMIN PO), Take by mouth., Disp: , Rfl:  .  sildenafil (REVATIO) 20 MG tablet, 3-5 tablets as needed prior to intercourse, not to exceed 5 tablets in a day, Disp: 30 tablet, Rfl: 5 .  SYNTHROID 125 MCG tablet, TAKE ONE TABLET BY MOUTH ONCE DAILY, Disp: 30 tablet, Rfl: 12 .  TRAVATAN Z 0.004 % SOLN ophthalmic solution, , Disp: , Rfl:   Review of Systems  Constitutional: Negative for activity change, appetite change, chills, diaphoresis, fatigue, fever and unexpected weight change.  Respiratory: Negative for cough and shortness of breath.   Cardiovascular: Negative for chest pain, palpitations and leg swelling.  Musculoskeletal: Positive for arthralgias.  Neurological: Negative for dizziness, light-headedness, numbness and headaches.    Social History   Tobacco Use  . Smoking status: Former Research scientist (life sciences)  . Smokeless tobacco: Never Used  . Tobacco comment: quit in 1970's  Substance Use Topics  . Alcohol use: No    Alcohol/week: 0.0 oz   Objective:   BP 138/72   Pulse 78   Temp 98 F (  36.7 C)   Resp 16   Ht 6\' 1"  (1.854 m)   Wt 207 lb (93.9 kg)   SpO2 98%   BMI 27.31 kg/m  Vitals:   11/08/17 1124  BP: 138/72  Pulse: 78  Resp: 16  Temp: 98 F (36.7 C)  SpO2: 98%  Weight: 207 lb (93.9 kg)  Height: 6\' 1"  (1.854 m)     Physical Exam   General Appearance:    Alert, cooperative, no distress  Eyes:    PERRL, conjunctiva/corneas clear, EOM's intact       Lungs:     Clear to auscultation bilaterally, respirations unlabored  Heart:    Regular rate and rhythm  Neurologic:   Awake, alert, oriented x 3. No apparent focal neurological            defect.           Assessment & Plan:     1. Hypertension, benign Well controlled.  Continue current medications.    2. Hypothyroidism, unspecified type Doing well on current levothyroxine replacement. Check labs at follow up .  3. Rheumatoid arthritis involving multiple sites, unspecified rheumatoid factor presence ([facility]) Doing well with ibuprofen prn.   4. Pure hypercholesterolemia He is tolerating lovastatin well with no adverse effects.    Return in about 6 months (around 05/08/2018).   The entirety of the information documented in the History of Present Illness, Review of Systems and Physical Exam were personally obtained by me. Portions of this information were initially documented by Wilburt Finlay, CMA and reviewed by me for thoroughness and accuracy.        Lelon Huh, MD  [facility]

## 2017-12-02 ENCOUNTER — Ambulatory Visit: Payer: Self-pay | Admitting: Family Medicine

## 2018-01-16 ENCOUNTER — Other Ambulatory Visit: Payer: Self-pay | Admitting: Family Medicine

## 2018-02-08 ENCOUNTER — Telehealth: Payer: Self-pay | Admitting: Family Medicine

## 2018-02-08 NOTE — Telephone Encounter (Signed)
Form received and given to Dr. Caryn Section.

## 2018-02-08 NOTE — Telephone Encounter (Signed)
Received [employer] form ( Rheumatoid arthritis diagnosis verification). Put in providers box to be fill out. Thanks CC

## 2018-02-14 NOTE — Telephone Encounter (Signed)
Butch Penny from [place] had called to inquire on status of form that was faxed. KW

## 2018-02-14 NOTE — Telephone Encounter (Signed)
Please advise 

## 2018-02-15 NOTE — Telephone Encounter (Signed)
I haven't seen this form, can they send it again.

## 2018-02-15 NOTE — Telephone Encounter (Signed)
L/M for Butch Penny to refax the form.

## 2018-02-16 ENCOUNTER — Encounter: Payer: Self-pay | Admitting: Family Medicine

## 2018-02-16 NOTE — Telephone Encounter (Signed)
Fax was received and given to Dr. Caryn Section.

## 2018-02-17 NOTE — Telephone Encounter (Signed)
Completed.

## 2018-04-10 ENCOUNTER — Other Ambulatory Visit: Payer: Self-pay | Admitting: Family Medicine

## 2018-04-17 DIAGNOSIS — H401131 Primary open-angle glaucoma, bilateral, mild stage: Secondary | ICD-10-CM | POA: Diagnosis not present

## 2018-04-24 ENCOUNTER — Telehealth: Payer: Self-pay | Admitting: Family Medicine

## 2018-04-24 DIAGNOSIS — L84 Corns and callosities: Secondary | ICD-10-CM

## 2018-04-24 NOTE — Telephone Encounter (Signed)
Pt's daughter Lew Dawes called requesting referral to Dr Milinda Pointer for callus on right foot.We have referred him there in past for the same reason.Her call back # [phone]

## 2018-04-24 NOTE — Telephone Encounter (Signed)
Please advise 

## 2018-04-26 ENCOUNTER — Telehealth: Payer: Self-pay | Admitting: Family Medicine

## 2018-04-26 DIAGNOSIS — M79673 Pain in unspecified foot: Secondary | ICD-10-CM

## 2018-04-26 NOTE — Telephone Encounter (Signed)
Pt has an appt tomorrow at [facility] where he has been before.  [facility] told them her would have to have a new referral  Call back is [phone]  Thanks teri

## 2018-04-26 NOTE — Telephone Encounter (Signed)
Please advise referral?  

## 2018-05-04 ENCOUNTER — Encounter: Payer: Self-pay | Admitting: Podiatry

## 2018-05-04 ENCOUNTER — Ambulatory Visit: Payer: Medicare HMO | Admitting: Podiatry

## 2018-05-04 VITALS — BP 162/94 | HR 77

## 2018-05-04 DIAGNOSIS — B351 Tinea unguium: Secondary | ICD-10-CM

## 2018-05-04 DIAGNOSIS — M79676 Pain in unspecified toe(s): Secondary | ICD-10-CM

## 2018-05-04 DIAGNOSIS — Q828 Other specified congenital malformations of skin: Secondary | ICD-10-CM | POA: Diagnosis not present

## 2018-05-04 NOTE — Progress Notes (Signed)
Complaint:  Visit Type: Patient returns to my office for continued preventative foot care services. Complaint: Patient states" my nails have grown long and thick and become painful to walk and wear shoes" Patient has a painful callus noted on his right forefoot.. The patient presents for preventative foot care services. No changes to ROS  Podiatric Exam: Vascular: dorsalis pedis and posterior tibial pulses are palpable bilateral. Capillary return is immediate. Temperature gradient is WNL. Skin turgor WNL  Sensorium: Normal Semmes Weinstein monofilament test. Normal tactile sensation bilaterally. Nail Exam: Pt has thick disfigured discolored nails with subungual debris noted bilateral entire nail hallux through fifth toenails Ulcer Exam: There is no evidence of ulcer or pre-ulcerative changes or infection. Orthopedic Exam: Muscle tone and strength are WNL. No limitations in general ROM. No crepitus or effusions noted. Foot type and digits show no abnormalities. Bony prominences are unremarkable. 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 Skin:  Porokeratosis sub 2 right.  . No infection or ulcers  Diagnosis:  Onychomycosis, , Pain in right toe, pain in left toes Porokeratosis  Right foot.  Treatment & Plan Procedures and Treatment: Consent by patient was obtained for treatment procedures.   Debridement of mycotic and hypertrophic toenails, 1 through 5 bilateral and clearing of subungual debris. No ulceration, no infection noted. Debridement of porokeratosis  Right. Return Visit-Office Procedure: Patient instructed to return to the office for a follow up visit 3 months for continued evaluation and treatment.    Gardiner Barefoot DPM

## 2018-05-08 ENCOUNTER — Other Ambulatory Visit: Payer: Self-pay | Admitting: Family Medicine

## 2018-05-10 ENCOUNTER — Ambulatory Visit (INDEPENDENT_AMBULATORY_CARE_PROVIDER_SITE_OTHER): Payer: Medicare HMO | Admitting: Family Medicine

## 2018-05-10 ENCOUNTER — Encounter: Payer: Self-pay | Admitting: Family Medicine

## 2018-05-10 VITALS — BP 164/92 | HR 63 | Temp 98.1°F | Resp 16 | Ht 73.0 in | Wt 199.0 lb

## 2018-05-10 DIAGNOSIS — E7800 Pure hypercholesterolemia, unspecified: Secondary | ICD-10-CM | POA: Diagnosis not present

## 2018-05-10 DIAGNOSIS — I10 Essential (primary) hypertension: Secondary | ICD-10-CM

## 2018-05-10 DIAGNOSIS — E039 Hypothyroidism, unspecified: Secondary | ICD-10-CM

## 2018-05-10 MED ORDER — HYDROCHLOROTHIAZIDE 25 MG PO TABS
12.5000 mg | ORAL_TABLET | Freq: Every day | ORAL | 0 refills | Status: DC
Start: 2018-05-10 — End: 2018-11-14

## 2018-05-10 NOTE — Progress Notes (Signed)
Patient: Daniel Carr Male    DOB: 1935/01/22   83 y.o.   MRN: [id number] Visit Date: 05/10/2018  Today's Provider: Lelon Huh, MD   Chief Complaint  Patient presents with  . Follow-up  . Hypertension  . Hyperlipidemia  . Hypothyroidism   Subjective:    HPI   Hypertension, follow-up:  BP Readings from Last 3 Encounters:  05/10/18 (!) 164/92  05/04/18 (!) 162/94  11/08/17 138/72    He was last seen for hypertension 6 months ago.  BP at that visit was 138/72. Management since that visit includes; no changes.He reports good compliance with treatment. He is not having side effects. none He is not exercising. He is adherent to low salt diet.   Outside blood pressures are can't remember readings. He is experiencing none.  Patient denies none.   Cardiovascular risk factors include advanced age (older than 55 for men, 30 for women).  Use of agents associated with hypertension: none.   ----------------------------------------------------------------    Lipid/Cholesterol, Follow-up:   Last seen for this 6 months ago.  Management since that visit includes; no changes.  Last Lipid Panel:    Component Value Date/Time   CHOL 147 06/09/2017 0921   TRIG 60 06/09/2017 0921   HDL 72 06/09/2017 0921   CHOLHDL 2.0 06/09/2017 0921   LDLCALC 63 06/09/2017 0921    He reports good compliance with treatment. He is not having side effects. none  Wt Readings from Last 3 Encounters:  05/10/18 199 lb (90.3 kg)  11/08/17 207 lb (93.9 kg)  06/08/17 215 lb (97.5 kg)    ----------------------------------------------------------------   Hypothyroidism, unspecified type From 11/08/2017-no changes. Doing well on current levothyroxine replacement. Check labs at follow up . Lab Results  Component Value Date   TSH 3.000 06/09/2017       No Known Allergies   Current Outpatient Medications:  .  aspirin 81 MG tablet, Take 81 mg by mouth daily., Disp: , Rfl:  .   brimonidine (ALPHAGAN) 0.2 % ophthalmic solution, , Disp: , Rfl:  .  finasteride (PROSCAR) 5 MG tablet, TAKE 1 TABLET BY MOUTH ONCE DAILY, Disp: 30 tablet, Rfl: 12 .  ibuprofen (ADVIL,MOTRIN) 200 MG tablet, Take 1 tablet by mouth every 6 (six) hours as needed., Disp: , Rfl:  .  lovastatin (MEVACOR) 40 MG tablet, TAKE 1 TABLET BY MOUTH ONCE DAILY, Disp: 90 tablet, Rfl: 4 .  Multiple Vitamin (DAILY VITAMIN PO), Take by mouth., Disp: , Rfl:  .  SYNTHROID 125 MCG tablet, TAKE ONE TABLET BY MOUTH ONCE DAILY, Disp: 30 tablet, Rfl: 12 .  TRAVATAN Z 0.004 % SOLN ophthalmic solution, , Disp: , Rfl:  .  sildenafil (REVATIO) 20 MG tablet, 3-5 tablets as needed prior to intercourse, not to exceed 5 tablets in a day (Patient not taking: Reported on 05/10/2018), Disp: 30 tablet, Rfl: 5  Review of Systems  Constitutional: Negative for appetite change, chills and fever.  Respiratory: Negative for chest tightness, shortness of breath and wheezing.   Cardiovascular: Negative for chest pain and palpitations.  Gastrointestinal: Negative for abdominal pain, nausea and vomiting.    Social History   Tobacco Use  . Smoking status: Former Research scientist (life sciences)  . Smokeless tobacco: Never Used  . Tobacco comment: quit in 1970's  Substance Use Topics  . Alcohol use: No    Alcohol/week: 0.0 oz   Objective:   BP (!) 164/92 (BP Location: Right Arm, Cuff Size: Large)   Pulse 63  Temp 98.1 F (36.7 C) (Oral)   Resp 16   Ht 6\' 1"  (1.854 m)   Wt 199 lb (90.3 kg)   SpO2 99%   BMI 26.25 kg/m  Vitals:   05/10/18 0854 05/10/18 0911  BP: (!) 164/100 (!) 164/92  Pulse: 63   Resp: 16   Temp: 98.1 F (36.7 C)   TempSrc: Oral   SpO2: 99%   Weight: 199 lb (90.3 kg)   Height: 6\' 1"  (1.854 m)      Physical Exam   General Appearance:    Alert, cooperative, no distress  Eyes:    PERRL, conjunctiva/corneas clear, EOM's intact       Lungs:     Clear to auscultation bilaterally, respirations unlabored  Heart:    Regular rate  and rhythm  Neurologic:   Awake, alert, oriented x 3. No apparent focal neurological           defect.           Assessment & Plan:     1. Hypothyroidism, unspecified type  - TSH  2. Pure hypercholesterolemia He is tolerating lovastatin well with no adverse effects.   - Comprehensive metabolic panel - Lipid panel  3. Hypertension, benign Uncontrolled.  Start back on previous dose of hctz - Comprehensive metabolic panel - hydrochlorothiazide (HYDRODIURIL) 25 MG tablet; Take 0.5 tablets (12.5 mg total) by mouth daily.  Dispense: 1 tablet; Refill: 0        Lelon Huh, MD  [facility]

## 2018-05-11 LAB — LIPID PANEL
Chol/HDL Ratio: 2.1 ratio (ref 0.0–5.0)
Cholesterol, Total: 165 mg/dL (ref 100–199)
HDL: 79 mg/dL (ref >39)
LDL Calculated: 73 mg/dL (ref 0–99)
Triglycerides: 64 mg/dL (ref 0–149)
VLDL Cholesterol Cal: 13 mg/dL (ref 5–40)

## 2018-05-11 LAB — COMPREHENSIVE METABOLIC PANEL
ALT: 12 IU/L (ref 0–44)
AST: 20 IU/L (ref 0–40)
Albumin/Globulin Ratio: 1.7 (ref 1.2–2.2)
Albumin: 4.3 g/dL (ref 3.5–4.7)
Alkaline Phosphatase: 38 IU/L — ABNORMAL LOW (ref 39–117)
BUN/Creatinine Ratio: 12 (ref 10–24)
BUN: 11 mg/dL (ref 8–27)
Bilirubin Total: 0.5 mg/dL (ref 0.0–1.2)
CO2: 28 mmol/L (ref 20–29)
Calcium: 9.5 mg/dL (ref 8.6–10.2)
Chloride: 101 mmol/L (ref 96–106)
Creatinine, Ser: 0.95 mg/dL (ref 0.76–1.27)
GFR calc Af Amer: 86 mL/min/{1.73_m2} (ref >59)
GFR calc non Af Amer: 75 mL/min/{1.73_m2} (ref >59)
Globulin, Total: 2.6 g/dL (ref 1.5–4.5)
Glucose: 78 mg/dL (ref 65–99)
Potassium: 4.2 mmol/L (ref 3.5–5.2)
Sodium: 141 mmol/L (ref 134–144)
Total Protein: 6.9 g/dL (ref 6.0–8.5)

## 2018-05-11 LAB — TSH: TSH: 1.87 u[IU]/mL (ref 0.450–4.500)

## 2018-05-26 ENCOUNTER — Telehealth: Payer: Self-pay

## 2018-05-26 NOTE — Telephone Encounter (Signed)
Called pt to schedule AWV prior to CPE on 06/09/18. Pt declined the AWV this year.  -MM

## 2018-06-08 NOTE — Progress Notes (Signed)
Patient: Daniel Carr, Male    DOB: March 12, 1935, 83 y.o.   MRN: [id number] Visit Date: 06/09/2018  Today's Provider: Lelon Huh, MD   Chief Complaint  Patient presents with  . Annual Exam   Subjective:     Complete Physical Daniel Carr is a 83 y.o. male. He feels well. He reports no regular exercising. He reports he is sleeping fairly well.   Wt Readings from Last 3 Encounters:  06/09/18 199 lb (90.3 kg)  05/10/18 199 lb (90.3 kg)  11/08/17 207 lb (93.9 kg)    Is noted to have had 8 pound weight loss since November.He denies any stomach pains. He does eat, but hasn't really noticed if his appetite has changed at all. No nausea, vomiting, or change in bowel habits.   Results for orders placed or performed in visit on 05/10/18  Comprehensive metabolic panel  Result Value Ref Range   Glucose 78 65 - 99 mg/dL   BUN 11 8 - 27 mg/dL   Creatinine, Ser 0.95 0.76 - 1.27 mg/dL   GFR calc non Af Amer 75 >59 mL/min/1.73   GFR calc Af Amer 86 >59 mL/min/1.73   BUN/Creatinine Ratio 12 10 - 24   Sodium 141 134 - 144 mmol/L   Potassium 4.2 3.5 - 5.2 mmol/L   Chloride 101 96 - 106 mmol/L   CO2 28 20 - 29 mmol/L   Calcium 9.5 8.6 - 10.2 mg/dL   Total Protein 6.9 6.0 - 8.5 g/dL   Albumin 4.3 3.5 - 4.7 g/dL   Globulin, Total 2.6 1.5 - 4.5 g/dL   Albumin/Globulin Ratio 1.7 1.2 - 2.2   Bilirubin Total 0.5 0.0 - 1.2 mg/dL   Alkaline Phosphatase 38 (L) 39 - 117 IU/L   AST 20 0 - 40 IU/L   ALT 12 0 - 44 IU/L  Lipid panel  Result Value Ref Range   Cholesterol, Total 165 100 - 199 mg/dL   Triglycerides 64 0 - 149 mg/dL   HDL 79 >39 mg/dL   VLDL Cholesterol Cal 13 5 - 40 mg/dL   LDL Calculated 73 0 - 99 mg/dL   Chol/HDL Ratio 2.1 0.0 - 5.0 ratio  TSH  Result Value Ref Range   TSH 1.870 0.450 - 4.500 uIU/mL    -----------------------------------------------------------   Review of Systems  Constitutional: Positive for unexpected weight change. Negative for appetite  change, chills, fatigue and fever.  HENT: Positive for rhinorrhea. Negative for congestion, ear pain, hearing loss, nosebleeds and trouble swallowing.   Eyes: Negative for pain and visual disturbance.  Respiratory: Positive for cough. Negative for chest tightness and shortness of breath.   Cardiovascular: Negative for chest pain, palpitations and leg swelling.  Gastrointestinal: Positive for constipation. Negative for abdominal pain, blood in stool, diarrhea, nausea and vomiting.  Endocrine: Negative for polydipsia, polyphagia and polyuria.  Genitourinary: Negative for dysuria and flank pain.  Musculoskeletal: Positive for arthralgias. Negative for back pain, joint swelling, myalgias and neck stiffness.  Skin: Negative for color change, rash and wound.  Neurological: Negative for dizziness, tremors, seizures, speech difficulty, weakness, light-headedness and headaches.  Psychiatric/Behavioral: Negative for behavioral problems, confusion, decreased concentration, dysphoric mood and sleep disturbance. The patient is not nervous/anxious.   All other systems reviewed and are negative.   Social History   Socioeconomic History  . Marital status: Married    Spouse name: Not on file  . Number of children: 1  . Years of education: college  .  Highest education level: Not on file  Occupational History  . Occupation: Retired  Scientific laboratory technician  . Financial resource strain: Not on file  . Food insecurity:    Worry: Not on file    Inability: Not on file  . Transportation needs:    Medical: Not on file    Non-medical: Not on file  Tobacco Use  . Smoking status: Former Research scientist (life sciences)  . Smokeless tobacco: Never Used  . Tobacco comment: quit in 1970's  Substance and Sexual Activity  . Alcohol use: No    Alcohol/week: 0.0 oz  . Drug use: No  . Sexual activity: Yes  Lifestyle  . Physical activity:    Days per week: Not on file    Minutes per session: Not on file  . Stress: Not on file  Relationships    . Social connections:    Talks on phone: Not on file    Gets together: Not on file    Attends religious service: Not on file    Active member of club or organization: Not on file    Attends meetings of clubs or organizations: Not on file    Relationship status: Not on file  . Intimate partner violence:    Fear of current or ex partner: Not on file    Emotionally abused: Not on file    Physically abused: Not on file    Forced sexual activity: Not on file  Other Topics Concern  . Not on file  Social History Narrative  . Not on file    Past Medical History:  Diagnosis Date  . childhood infection    chicken pox, measels, and mumps  . Hyperlipidemia   . Hypertension   . Sinus problem   . Ulnar neuropathy      Patient Active Problem List   Diagnosis Date Noted  . Systolic dysfunction 41/93/7902  . Callus of foot 07/27/2016  . Nail dystrophy 07/27/2016  . Arthropathia 06/09/2015  . Neuropathic ulnar nerve 06/09/2015  . Glaucoma   . Hypothyroid   . Pure hypercholesterolemia   . Hypertension, benign   . Arthritis, multiple joint involvement   . BPH (benign prostatic hyperplasia)   . ED (erectile dysfunction)   . Nocturia associated with benign prostatic hypertrophy     Past Surgical History:  Procedure Laterality Date  . APPENDECTOMY    . CATARACT EXTRACTION Bilateral   . KNEE SURGERY  10/04/2011  . NM MYOVIEW ([place] HX)  07/21/2017   [facility]. No ischemia.   [name] RETINAL DETACHMENT REPAIR W/ SCLERAL BUCKLE LE      His family history includes Hypertension in his father and mother; Stroke in his father and sister.      Current Outpatient Medications:  .  aspirin 81 MG tablet, Take 81 mg by mouth daily., Disp: , Rfl:  .  brimonidine (ALPHAGAN) 0.2 % ophthalmic solution, , Disp: , Rfl:  .  finasteride (PROSCAR) 5 MG tablet, TAKE 1 TABLET BY MOUTH ONCE DAILY, Disp: 30 tablet, Rfl: 12 .  hydrochlorothiazide (HYDRODIURIL) 25 MG tablet, Take 0.5 tablets (12.5 mg total) by mouth  daily., Disp: 1 tablet, Rfl: 0 .  ibuprofen (ADVIL,MOTRIN) 200 MG tablet, Take 1 tablet by mouth every 6 (six) hours as needed., Disp: , Rfl:  .  ipratropium (ATROVENT) 0.06 % nasal spray, , Disp: , Rfl: 6 .  lovastatin (MEVACOR) 40 MG tablet, TAKE 1 TABLET BY MOUTH ONCE DAILY, Disp: 90 tablet, Rfl: 4 .  Multiple Vitamin (DAILY VITAMIN PO),  Take by mouth., Disp: , Rfl:  .  sildenafil (REVATIO) 20 MG tablet, 3-5 tablets as needed prior to intercourse, not to exceed 5 tablets in a day, Disp: 30 tablet, Rfl: 5 .  SYNTHROID 125 MCG tablet, TAKE ONE TABLET BY MOUTH ONCE DAILY, Disp: 30 tablet, Rfl: 12 .  TRAVATAN Z 0.004 % SOLN ophthalmic solution, , Disp: , Rfl:   Patient Care Team: [name], MD as PCP - General (Family Medicine) Dingeldein, Remo Lipps, MD as Consulting Physician (Ophthalmology)     Objective:   Vitals: BP 98/68 (BP Location: Right Arm, Cuff Size: Large)   Pulse 82   Temp 97.8 F (36.6 C) (Oral)   Resp 16   Ht 6\' 1"  (1.854 m)   Wt 199 lb (90.3 kg)   SpO2 97% Comment: room air  BMI 26.25 kg/m   Physical Exam    General Appearance:    Alert, cooperative, no distress, appears stated age  Head:    Normocephalic, without obvious abnormality, atraumatic  Eyes:    PERRL, conjunctiva/corneas clear, EOM's intact, fundi    benign, both eyes       Ears:    Normal TM's and external ear canals, both ears  Nose:   Nares normal, septum midline, mucosa normal, no drainage   or sinus tenderness  Throat:   Lips, mucosa, and tongue normal; teeth and gums normal  Neck:   Supple, symmetrical, trachea midline, no adenopathy;       thyroid:  No enlargement/tenderness/nodules; no carotid   bruit or JVD  Back:     Symmetric, no curvature, ROM normal, no CVA tenderness  Lungs:     Clear to auscultation bilaterally, respirations unlabored  Chest wall:    No tenderness or deformity  Heart:    Regular rate and rhythm, S1 and S2 normal, no murmur, rub   or gallop  Abdomen:     Soft,  non-tender, bowel sounds active all four quadrants,    no masses, no organomegaly  Genitalia:    deferred  Rectal:    deferred  Extremities:   Extremities normal, atraumatic, no cyanosis or edema  Pulses:   2+ and symmetric all extremities  Skin:   Skin color, texture, turgor normal, no rashes or lesions  Lymph nodes:   Cervical, supraclavicular, and axillary nodes normal  Neurologic:   CNII-XII intact. Normal strength, sensation and reflexes      throughout    Activities of Daily Living In your present state of health, do you have any difficulty performing the following activities: 06/09/2018  Hearing? N  Vision? N  Difficulty concentrating or making decisions? N  Walking or climbing stairs? Y  Dressing or bathing? N  Doing errands, shopping? N  Some recent data might be hidden    Fall Risk Assessment Fall Risk  06/09/2018 06/08/2017 06/08/2016 06/06/2015  Falls in the past year? No No No No     Depression Screen PHQ 2/9 Scores 06/09/2018 06/08/2017 06/08/2017 06/08/2016  PHQ - 2 Score 0 0 0 0  PHQ- 9 Score 2 2 - 1    Cognitive Testing - 6-CIT  Correct? Score   What year is it? yes 0 0 or 4  What month is it? yes 0 0 or 3  Memorize:    Pia Mau,  44,  [place],      What time is it? (within 1 hour) yes 0 0 or 3  Count backwards from 20 yes 0 0,  2, or 4  Name the months of the year yes 0 0, 2, or 4  Repeat name & address above no 3 0, 2, 4, 6, 8, or 10       TOTAL SCORE  3/28   Interpretation:  Normal  Normal (0-7) Abnormal (8-28)    Audit-C Alcohol Use Screening   Alcohol Use Disorder Test (AUDIT) 06/09/2018  1. How often do you have a drink containing alcohol? 0  2. How many drinks containing alcohol do you have on a typical day when you are drinking? 0  3. How often do you have six or more drinks on one occasion? 0  AUDIT-C Score 0    A score of 3 or more in women, and 4 or more in men indicates increased risk for alcohol abuse, EXCEPT if all of the  points are from question 1    Assessment & Plan:    Annual Physical Reviewed patient's Family Medical History Reviewed and updated list of patient's medical providers Assessment of cognitive impairment was done Assessed patient's functional ability Established a written schedule for health screening [place] Completed and Reviewed  Exercise Activities and Dietary recommendations Goals    None      Immunization History  Administered Date(s) Administered  . Influenza, High Dose Seasonal PF 10/07/2015, 10/16/2016, 09/02/2017  . Influenza-Unspecified 09/19/2014  . Pneumococcal Conjugate-13 06/03/2014  . Pneumococcal Polysaccharide-23 04/16/2003  . Td 03/04/1997  . Tdap 11/09/2011  . Zoster 05/09/2008, 06/03/2014    Health Maintenance  Topic Date Due  . INFLUENZA VACCINE  07/27/2018  . TETANUS/TDAP  11/08/2021  . PNA vac Low Risk Adult  Completed     Discussed health benefits of physical activity, and encouraged him to engage in regular exercise appropriate for his age and condition.    ------------------------------------------------------------------------------------------------------------  1. Annual physical exam Generally doing well. Unremarkable exam. Mild loss of weight noted as below.   2. Prostate cancer screening  - PSA Total (Reflex To Free)  3. Hypertension, benign Well controlled.  Continue current medications.   - EKG 12-Lead  4. BMI 26.0-26.9,adult   5. Abnormal loss of weight Likely functional.  - Amylase - CBC   Lelon Huh, MD  [facility]

## 2018-06-09 ENCOUNTER — Encounter: Payer: Self-pay | Admitting: Family Medicine

## 2018-06-09 ENCOUNTER — Ambulatory Visit (INDEPENDENT_AMBULATORY_CARE_PROVIDER_SITE_OTHER): Payer: Medicare HMO | Admitting: Family Medicine

## 2018-06-09 VITALS — BP 98/68 | HR 82 | Temp 97.8°F | Resp 16 | Ht 73.0 in | Wt 199.0 lb

## 2018-06-09 DIAGNOSIS — Z125 Encounter for screening for malignant neoplasm of prostate: Secondary | ICD-10-CM

## 2018-06-09 DIAGNOSIS — I10 Essential (primary) hypertension: Secondary | ICD-10-CM | POA: Diagnosis not present

## 2018-06-09 DIAGNOSIS — Z0000 Encounter for general adult medical examination without abnormal findings: Secondary | ICD-10-CM

## 2018-06-09 DIAGNOSIS — R634 Abnormal weight loss: Secondary | ICD-10-CM | POA: Diagnosis not present

## 2018-06-09 DIAGNOSIS — Z6826 Body mass index (BMI) 26.0-26.9, adult: Secondary | ICD-10-CM

## 2018-06-09 NOTE — Patient Instructions (Signed)
   The CDC recommends two doses of Shingrix (the shingles vaccine) separated by 2 to 6 months for adults age 81 years and older. I recommend checking with your insurance plan regarding coverage for this vaccine.   

## 2018-06-10 LAB — CBC
Hematocrit: 37 % — ABNORMAL LOW (ref 37.5–51.0)
Hemoglobin: 12.3 g/dL — ABNORMAL LOW (ref 13.0–17.7)
MCH: 30.9 pg (ref 26.6–33.0)
MCHC: 33.2 g/dL (ref 31.5–35.7)
MCV: 93 fL (ref 79–97)
Platelets: 249 10*3/uL (ref 150–450)
RBC: 3.98 x10E6/uL — ABNORMAL LOW (ref 4.14–5.80)
RDW: 14.3 % (ref 12.3–15.4)
WBC: 5.2 10*3/uL (ref 3.4–10.8)

## 2018-06-10 LAB — PSA TOTAL (REFLEX TO FREE): Prostate Specific Ag, Serum: 3.4 ng/mL (ref 0.0–4.0)

## 2018-06-10 LAB — AMYLASE: Amylase: 173 U/L — ABNORMAL HIGH (ref 31–124)

## 2018-06-12 ENCOUNTER — Telehealth: Payer: Self-pay | Admitting: *Deleted

## 2018-06-12 DIAGNOSIS — R634 Abnormal weight loss: Secondary | ICD-10-CM

## 2018-06-12 DIAGNOSIS — R748 Abnormal levels of other serum enzymes: Secondary | ICD-10-CM

## 2018-06-12 NOTE — Telephone Encounter (Signed)
-----   Message from [name], MD sent at 06/11/2018  7:59 PM EDT ----- Amylase, which is a pancreatic enzyme, is mildly elevated. Need to schedule abdominal ultrasound for evaluation of weight loss and elevated amylase

## 2018-06-12 NOTE — Telephone Encounter (Signed)
Patient was notified of results. Patient is agreeable to US abdominal. Order in epic.

## 2018-06-16 ENCOUNTER — Telehealth: Payer: Self-pay | Admitting: Family Medicine

## 2018-06-16 NOTE — Telephone Encounter (Signed)
Pt said he was in earlier this week and is supposed to be scheduled an  Abdominal US.   He has not heard anything back.  He prefers the appt at the end of the week if it is next week  [phone]  [facility]

## 2018-06-16 NOTE — Telephone Encounter (Signed)
Please advise 

## 2018-06-22 ENCOUNTER — Ambulatory Visit
Admission: RE | Admit: 2018-06-22 | Discharge: 2018-06-22 | Disposition: A | Discharge status: Home / Self Care | Payer: Medicare HMO | Source: Ambulatory Visit | Attending: Family Medicine | Admitting: Family Medicine

## 2018-06-22 DIAGNOSIS — Z6826 Body mass index (BMI) 26.0-26.9, adult: Secondary | ICD-10-CM | POA: Insufficient documentation

## 2018-06-22 DIAGNOSIS — R634 Abnormal weight loss: Secondary | ICD-10-CM

## 2018-06-22 DIAGNOSIS — R748 Abnormal levels of other serum enzymes: Secondary | ICD-10-CM | POA: Diagnosis not present

## 2018-07-06 ENCOUNTER — Ambulatory Visit (INDEPENDENT_AMBULATORY_CARE_PROVIDER_SITE_OTHER): Payer: Medicare HMO | Admitting: Family Medicine

## 2018-07-06 ENCOUNTER — Encounter: Payer: Self-pay | Admitting: Family Medicine

## 2018-07-06 VITALS — BP 124/80 | HR 68 | Temp 97.8°F | Resp 16 | Wt 199.0 lb

## 2018-07-06 DIAGNOSIS — R351 Nocturia: Secondary | ICD-10-CM

## 2018-07-06 DIAGNOSIS — R748 Abnormal levels of other serum enzymes: Secondary | ICD-10-CM

## 2018-07-06 DIAGNOSIS — R634 Abnormal weight loss: Secondary | ICD-10-CM | POA: Diagnosis not present

## 2018-07-06 DIAGNOSIS — N401 Enlarged prostate with lower urinary tract symptoms: Secondary | ICD-10-CM

## 2018-07-06 MED ORDER — DUTASTERIDE 0.5 MG PO CAPS
0.5000 mg | ORAL_CAPSULE | Freq: Every day | ORAL | 3 refills | Status: DC
Start: 2018-07-06 — End: 2018-10-23

## 2018-07-06 NOTE — Progress Notes (Signed)
Patient: Daniel Carr Male    DOB: May 22, 1935   83 y.o.   MRN: [id number] Visit Date: 07/06/2018  Today's Provider: Lelon Huh, MD   Chief Complaint  Patient presents with  . Weight Loss    follow up   Subjective:    HPI Follow up of Abnormal Weight loss: Patient was last seen for this problem 1 month ago. Management during that visit includes ordering labs which showed mildly elevated Amylase. Abdominal ultrasound was ordered  And was normal. Patient was advised to follow up in 10-14 days to check weight and recheck pancreatic enzymes. Today patient weight is stable from the last visit.  Results for orders placed or performed in visit on 06/09/18  Amylase  Result Value Ref Range   Amylase 173 (H) 31 - 124 U/L  CBC  Result Value Ref Range   WBC 5.2 3.4 - 10.8 x10E3/uL   RBC 3.98 (L) 4.14 - 5.80 x10E6/uL   Hemoglobin 12.3 (L) 13.0 - 17.7 g/dL   Hematocrit 37.0 (L) 37.5 - 51.0 %   MCV 93 79 - 97 fL   MCH 30.9 26.6 - 33.0 pg   MCHC 33.2 31.5 - 35.7 g/dL   RDW 14.3 12.3 - 15.4 %   Platelets 249 150 - 450 x10E3/uL  PSA Total (Reflex To Free)  Result Value Ref Range   Prostate Specific Ag, Serum 3.4 0.0 - 4.0 ng/mL   Reflex Criteria Comment      He also states he has been having more nocturia the last several months and would like to see if there is another medication that works better than avodart.     No Known Allergies   Current Outpatient Medications:  .  aspirin 81 MG tablet, Take 81 mg by mouth daily., Disp: , Rfl:  .  brimonidine (ALPHAGAN) 0.2 % ophthalmic solution, , Disp: , Rfl:  .  finasteride (PROSCAR) 5 MG tablet, TAKE 1 TABLET BY MOUTH ONCE DAILY, Disp: 30 tablet, Rfl: 12 .  hydrochlorothiazide (HYDRODIURIL) 25 MG tablet, Take 0.5 tablets (12.5 mg total) by mouth daily., Disp: 1 tablet, Rfl: 0 .  ibuprofen (ADVIL,MOTRIN) 200 MG tablet, Take 1 tablet by mouth every 6 (six) hours as needed., Disp: , Rfl:  .  ipratropium (ATROVENT) 0.06 % nasal spray,  , Disp: , Rfl: 6 .  lovastatin (MEVACOR) 40 MG tablet, TAKE 1 TABLET BY MOUTH ONCE DAILY, Disp: 90 tablet, Rfl: 4 .  Multiple Vitamin (DAILY VITAMIN PO), Take by mouth., Disp: , Rfl:  .  SYNTHROID 125 MCG tablet, TAKE ONE TABLET BY MOUTH ONCE DAILY, Disp: 30 tablet, Rfl: 12 .  TRAVATAN Z 0.004 % SOLN ophthalmic solution, , Disp: , Rfl:  .  sildenafil (REVATIO) 20 MG tablet, 3-5 tablets as needed prior to intercourse, not to exceed 5 tablets in a day (Patient not taking: Reported on 07/06/2018), Disp: 30 tablet, Rfl: 5  Review of Systems  Constitutional: Negative for appetite change, chills and fever.  Respiratory: Negative for chest tightness, shortness of breath and wheezing.   Cardiovascular: Negative for chest pain and palpitations.  Gastrointestinal: Negative for abdominal pain, nausea and vomiting.   Wt Readings from Last 3 Encounters:  07/06/18 199 lb (90.3 kg)  06/09/18 199 lb (90.3 kg)  05/10/18 199 lb (90.3 kg)     Social History   Tobacco Use  . Smoking status: Former Research scientist (life sciences)  . Smokeless tobacco: Never Used  . Tobacco comment: quit in 1970's  Substance  Use Topics  . Alcohol use: No    Alcohol/week: 0.0 oz   Objective:   BP 124/80 (BP Location: Left Arm, Patient Position: Sitting, Cuff Size: Large)   Pulse 68   Temp 97.8 F (36.6 C) (Oral)   Resp 16   Wt 199 lb (90.3 kg)   SpO2 97% Comment: room air  BMI 26.25 kg/m  Vitals:   07/06/18 0816  BP: 124/80  Pulse: 68  Resp: 16  Temp: 97.8 F (36.6 C)  TempSrc: Oral  SpO2: 97%  Weight: 199 lb (90.3 kg)     Physical Exam  General appearance: alert, well developed, well nourished, cooperative and in no distress Head: Normocephalic, without obvious abnormality, atraumatic Respiratory: Respirations even and unlabored, normal respiratory rate Extremities: No gross deformities Skin: Skin color, texture, turgor normal. No rashes seen  Psych: Appropriate mood and affect. Neurologic: Mental status: Alert,  oriented to person, place, and time, thought content appropriate.     Assessment & Plan:     1. Elevated amylase recheck- Amylase - Lipase  Consider CT if pancreatic enzymes still elevated.   2. Abnormal loss of weight Leveled off since last visit and no other symptoms.   - Amylase - Lipase  3. BPH Change finasteride to Avodart. Advised may take a few months for any improvement in symptoms.       Lelon Huh, MD  [facility]

## 2018-07-07 ENCOUNTER — Telehealth: Payer: Self-pay

## 2018-07-07 LAB — AMYLASE: Amylase: 158 U/L — ABNORMAL HIGH (ref 31–124)

## 2018-07-07 LAB — LIPASE: Lipase: 18 U/L (ref 13–78)

## 2018-07-07 NOTE — Telephone Encounter (Signed)
-----   Message from [name], MD sent at 07/07/2018  1:52 PM EDT ----- Pancreatic enzyme has come down, almost back to normal. No additional tests are needed. Will check levels periodically to make sure they don't go back up. Follow up about 4 months.

## 2018-07-07 NOTE — Telephone Encounter (Signed)
Pt advised.  He will call back to schedule an appointment.   Thanks,   -Mickel Baas

## 2018-08-10 ENCOUNTER — Ambulatory Visit: Payer: Medicare HMO | Admitting: Podiatry

## 2018-09-19 ENCOUNTER — Other Ambulatory Visit: Payer: Self-pay | Admitting: Family Medicine

## 2018-09-19 ENCOUNTER — Ambulatory Visit (INDEPENDENT_AMBULATORY_CARE_PROVIDER_SITE_OTHER): Payer: Medicare HMO | Admitting: Family Medicine

## 2018-09-19 DIAGNOSIS — Z23 Encounter for immunization: Secondary | ICD-10-CM

## 2018-09-19 DIAGNOSIS — M79642 Pain in left hand: Principal | ICD-10-CM

## 2018-09-19 DIAGNOSIS — M79641 Pain in right hand: Secondary | ICD-10-CM

## 2018-09-27 DIAGNOSIS — M2012 Hallux valgus (acquired), left foot: Secondary | ICD-10-CM | POA: Diagnosis not present

## 2018-09-27 DIAGNOSIS — M79671 Pain in right foot: Secondary | ICD-10-CM | POA: Diagnosis not present

## 2018-09-27 DIAGNOSIS — M2011 Hallux valgus (acquired), right foot: Secondary | ICD-10-CM | POA: Diagnosis not present

## 2018-09-27 DIAGNOSIS — M2142 Flat foot [pes planus] (acquired), left foot: Secondary | ICD-10-CM | POA: Diagnosis not present

## 2018-09-27 DIAGNOSIS — M20031 Swan-neck deformity of right finger(s): Secondary | ICD-10-CM | POA: Diagnosis not present

## 2018-09-27 DIAGNOSIS — M20032 Swan-neck deformity of left finger(s): Secondary | ICD-10-CM | POA: Diagnosis not present

## 2018-09-27 DIAGNOSIS — M79672 Pain in left foot: Secondary | ICD-10-CM | POA: Diagnosis not present

## 2018-09-27 DIAGNOSIS — R748 Abnormal levels of other serum enzymes: Secondary | ICD-10-CM | POA: Diagnosis not present

## 2018-09-27 DIAGNOSIS — R634 Abnormal weight loss: Secondary | ICD-10-CM | POA: Diagnosis not present

## 2018-09-27 DIAGNOSIS — M170 Bilateral primary osteoarthritis of knee: Secondary | ICD-10-CM | POA: Diagnosis not present

## 2018-09-27 DIAGNOSIS — M79641 Pain in right hand: Secondary | ICD-10-CM | POA: Diagnosis not present

## 2018-09-27 DIAGNOSIS — M79642 Pain in left hand: Secondary | ICD-10-CM | POA: Diagnosis not present

## 2018-09-27 DIAGNOSIS — M2141 Flat foot [pes planus] (acquired), right foot: Secondary | ICD-10-CM | POA: Diagnosis not present

## 2018-10-06 ENCOUNTER — Other Ambulatory Visit: Payer: Self-pay

## 2018-10-06 ENCOUNTER — Encounter: Payer: Self-pay | Admitting: Occupational Therapy

## 2018-10-06 ENCOUNTER — Ambulatory Visit: Payer: Medicare HMO | Attending: Internal Medicine | Admitting: Occupational Therapy

## 2018-10-06 DIAGNOSIS — M25641 Stiffness of right hand, not elsewhere classified: Secondary | ICD-10-CM | POA: Diagnosis not present

## 2018-10-06 DIAGNOSIS — M25642 Stiffness of left hand, not elsewhere classified: Secondary | ICD-10-CM | POA: Diagnosis not present

## 2018-10-06 DIAGNOSIS — M20032 Swan-neck deformity of left finger(s): Secondary | ICD-10-CM

## 2018-10-06 DIAGNOSIS — M20031 Swan-neck deformity of right finger(s): Secondary | ICD-10-CM | POA: Diagnosis not present

## 2018-10-06 NOTE — Patient Instructions (Signed)
Wear during day Oval 8 splints on PIP's to bilateral hands - 3rd thru 5th  extention hand base custom splint for night time on R hand   Elbow pads during day over Cubital tunnel - and no prop elbows up  And night time - elbow pads in volar elbows - to decrease flexion of elbows - and pressure on cubital tunnel  Order anti ulnar deviation splint for L hand for daytime use  To do 2 x day -RD of digits bilateral hands And prayer stretch  In shower

## 2018-10-06 NOTE — Therapy (Signed)
[facility] [address], [place], [address] Phone: [phone]   Fax:  [phone]  Occupational Therapy Evaluation  Patient Details  Name: Daniel Carr MRN: [id number] Date of Birth: 04-10-1935 No data recorded  Encounter Date: 10/06/2018  OT End of Session - 10/06/18 1446    Visit Number  1    Number of Visits  4    Date for OT Re-Evaluation  12/01/18    OT Start Time  1233    OT Stop Time  1327    OT Time Calculation (min)  54 min    Activity Tolerance  Patient tolerated treatment well    Behavior During Therapy  [facility] for tasks assessed/performed       Past Medical History:  Diagnosis Date  . childhood infection    chicken pox, measels, and mumps  . Hyperlipidemia   . Hypertension   . Sinus problem   . Ulnar neuropathy     Past Surgical History:  Procedure Laterality Date  . APPENDECTOMY    . CATARACT EXTRACTION Bilateral   . KNEE SURGERY  10/04/2011  . NM MYOVIEW ([place] HX)  07/21/2017   [facility]. No ischemia.   [name] RETINAL DETACHMENT REPAIR W/ SCLERAL BUCKLE LE      There were no vitals filed for this visit.  Subjective Assessment - 10/06/18 1441    Subjective   I had issues with my hands since I was born - and gradually got worse - but the middle 2 fingers on the R hand dropped and got stiff and cannot open them - not really pain - more stiffness in my fingers     Patient Stated Goals  If you can help my hands from not getting worse     Currently in Pain?  No/denies          Wear during day Oval 8 splints on PIP's to bilateral hands - 3rd thru 5th  extention hand base custom splint for night time on R hand   Elbow pads during day over Cubital tunnel - and no prop elbows up  And night time - elbow pads in volar elbows - to decrease flexion of elbows - and pressure on cubital tunnel  Order anti ulnar deviation splint for L hand for daytime use  To do 2 x day -RD of digits bilateral  hands And prayer stretch  In shower                    OT Education - 10/06/18 1445    Education Details  findings of eval and homeprogram -and splint wearing     Person(s) Educated  Patient;Spouse;Child(ren)    Methods  Demonstration;Explanation;Handout    Comprehension  Verbalized understanding;Returned demonstration          OT Long Term Goals - 10/06/18 1500      OT LONG TERM GOAL #1   Title  Pt to be independent in use of splints to decrease swanneck deformities, flexor contracture on R hand , and anti ulnar deviation splint on L     Baseline  no knowledge     Time  8    Period  Weeks    Status  New    Target Date  12/08/18      OT LONG TERM GOAL #2   Title  Pt to be ind in HEP for ROM to decrease contracture risk and decrease in motion and fucntion  Baseline  no knowledge     Time  4    Period  Weeks    Status  New    Target Date  11/03/18            Plan - 10/06/18 1448    Clinical Impression Statement  Pt present at OT eval with bilateral hands swanneck deformities on 3rd thru 5th digits- Subluxation of extensor tendons over MC's with R 3 rd and 4th the worse - unable to extend digits - severe ulnar deviation of digits with R hand worse than L - pt fiftted with oval 8 splints on 3rd thru 5th PIP's on bilatral hands to decrease swanneck - and custom hand base extention splint for R hand to wear at night time to decrease risk for flexor contracture on 3rd and 4th - pt also unable to extend bilateral elbows fully - has positive Tinel at  bilateral Cubital tunnels with R worse than L - and numbness in bilateral 5th digits - pt did in past had treatment form ortho and has elbow pads provided before - pt to find them and education done on how to wear them -as well as position to avoid and to modify how he sleeps and sit in chair with elbows- pt appear very motivated to do homeprogram to prevent hands from getting worse - order antiulnar deviation splint  for L hand to use during day - pt can benefit from OT services     Occupational performance deficits (Please refer to evaluation for details):  ADL's;IADL's;Rest and Sleep;Leisure    Rehab Potential  Fair    Current Impairments/barriers affecting progress:  long standing , chronic condition - had in past cubital tunnel     OT Frequency  Biweekly    OT Duration  8 weeks    OT Treatment/Interventions  Patient/family education;Splinting;Therapeutic exercise;Manual Therapy    Plan  assess splint use and if need to reinforce hand base extention splint for R hand - if received anti ulnar deviation splint     Clinical Decision Making  Several treatment options, min-mod task modification necessary    OT Home Exercise Plan  see pt instruction     Consulted and Agree with Plan of Care  Patient       Patient will benefit from skilled therapeutic intervention in order to improve the following deficits and impairments:  Impaired flexibility, Decreased knowledge of use of DME, Decreased coordination, Impaired UE functional use  Visit Diagnosis: Swan-neck deformity of left finger(s) - Plan: Ot plan of care cert/re-cert  Swan-neck deformity of right finger(s) - Plan: Ot plan of care cert/re-cert  Stiffness of left hand, not elsewhere classified - Plan: Ot plan of care cert/re-cert  Stiffness of right hand, not elsewhere classified - Plan: Ot plan of care cert/re-cert    Problem List Patient Active Problem List   Diagnosis Date Noted  . Systolic dysfunction 47/82/9562  . Primary osteoarthritis of left knee 07/28/2017  . Callus of foot 07/27/2016  . Nail dystrophy 07/27/2016  . Arthropathia 06/09/2015  . Neuropathic ulnar nerve 06/09/2015  . Glaucoma   . Hypothyroid   . Pure hypercholesterolemia   . Hypertension, benign   . Arthritis, multiple joint involvement   . BPH (benign prostatic hyperplasia)   . ED (erectile dysfunction)   . Nocturia associated with benign prostatic hypertrophy      Rosalyn Gess OTR/L,CLT 10/06/2018, 3:06 PM  [facility] [address]  [place], [place], [address] Phone: [phone]   Fax:  [phone]  Name: Daniel Carr MRN: [id number] Date of Birth: March 03, 1935

## 2018-10-10 DIAGNOSIS — M2012 Hallux valgus (acquired), left foot: Secondary | ICD-10-CM | POA: Diagnosis not present

## 2018-10-10 DIAGNOSIS — M2011 Hallux valgus (acquired), right foot: Secondary | ICD-10-CM | POA: Diagnosis not present

## 2018-10-10 DIAGNOSIS — M79675 Pain in left toe(s): Secondary | ICD-10-CM | POA: Diagnosis not present

## 2018-10-10 DIAGNOSIS — B351 Tinea unguium: Secondary | ICD-10-CM | POA: Diagnosis not present

## 2018-10-10 DIAGNOSIS — M79674 Pain in right toe(s): Secondary | ICD-10-CM | POA: Diagnosis not present

## 2018-10-16 DIAGNOSIS — H401131 Primary open-angle glaucoma, bilateral, mild stage: Secondary | ICD-10-CM | POA: Diagnosis not present

## 2018-10-18 ENCOUNTER — Ambulatory Visit: Payer: Medicare HMO | Admitting: Occupational Therapy

## 2018-10-18 DIAGNOSIS — M20032 Swan-neck deformity of left finger(s): Secondary | ICD-10-CM

## 2018-10-18 DIAGNOSIS — R769 Abnormal immunological finding in serum, unspecified: Secondary | ICD-10-CM | POA: Diagnosis not present

## 2018-10-18 DIAGNOSIS — M25641 Stiffness of right hand, not elsewhere classified: Secondary | ICD-10-CM

## 2018-10-18 DIAGNOSIS — M25642 Stiffness of left hand, not elsewhere classified: Secondary | ICD-10-CM

## 2018-10-18 DIAGNOSIS — M2141 Flat foot [pes planus] (acquired), right foot: Secondary | ICD-10-CM | POA: Diagnosis not present

## 2018-10-18 DIAGNOSIS — R748 Abnormal levels of other serum enzymes: Secondary | ICD-10-CM | POA: Diagnosis not present

## 2018-10-18 DIAGNOSIS — R634 Abnormal weight loss: Secondary | ICD-10-CM | POA: Diagnosis not present

## 2018-10-18 DIAGNOSIS — M2142 Flat foot [pes planus] (acquired), left foot: Secondary | ICD-10-CM | POA: Diagnosis not present

## 2018-10-18 DIAGNOSIS — M20031 Swan-neck deformity of right finger(s): Secondary | ICD-10-CM

## 2018-10-18 DIAGNOSIS — M170 Bilateral primary osteoarthritis of knee: Secondary | ICD-10-CM | POA: Diagnosis not present

## 2018-10-18 NOTE — Patient Instructions (Signed)
Wear during day Oval 8 splints on PIP's to bilateral hands - 3rd thru 5th - need to wear over PIP - to decrease swanneck  extention hand base custom splint for night time on R hand   Elbow pads during day over Cubital tunnel - and no prop elbows up  And night time if can - elbow pads in volar elbows - to decrease flexion of elbows - and pressure on cubital tunnel Ulnar N glide - 5 reps  3-4 x day   Order anti ulnar deviation splint for L hand for daytime use - will call when it comes in   To do 2 x day -RD of digits bilateral hands And prayer stretch  In shower

## 2018-10-18 NOTE — Therapy (Signed)
[facility] [address], [place], [address] Phone: [phone]   Fax:  [phone]  Occupational Therapy Treatment  Patient Details  Name: Daniel Carr MRN: [id number] Date of Birth: Jun 10, 1935 No data recorded  Encounter Date: 10/18/2018  OT End of Session - 10/18/18 1333    Visit Number  2    Number of Visits  4    Date for OT Re-Evaluation  12/01/18    OT Start Time  8889    OT Stop Time  1322    OT Time Calculation (min)  24 min    Activity Tolerance  Patient tolerated treatment well    Behavior During Therapy  [facility] for tasks assessed/performed       Past Medical History:  Diagnosis Date  . childhood infection    chicken pox, measels, and mumps  . Hyperlipidemia   . Hypertension   . Sinus problem   . Ulnar neuropathy     Past Surgical History:  Procedure Laterality Date  . APPENDECTOMY    . CATARACT EXTRACTION Bilateral   . KNEE SURGERY  10/04/2011  . NM MYOVIEW ([place] HX)  07/21/2017   [facility]. No ischemia.   [name] RETINAL DETACHMENT REPAIR W/ SCLERAL BUCKLE LE      There were no vitals filed for this visit.  Subjective Assessment - 10/18/18 1329    Subjective   Doing okay -sleeping with the paddle splint on the R -and oval 8 I wear during day - did loose one on my L hand - and numbness in my pinkies are better - did do the elbow pads like you told me- but cannot wear it to long - let me itch - wear it during the day     Patient Stated Goals  If you can help my hands from not getting worse     Currently in Pain?  No/denies         [facility] OT Assessment - 10/18/18 0001      Strength   Right Hand Grip (lbs)  42    Right Hand Lateral Pinch  17 lbs    Right Hand 3 Point Pinch  15 lbs    Left Hand Grip (lbs)  50    Left Hand Lateral Pinch  10 lbs    Left Hand 3 Point Pinch  12 lbs        assess oval 8 splint fit - pt lost L 4th finger - fitted with new one and replace 5th digits with  smaller splints -  Pt wear the oval 8 on proximal phalanges upon arrival - pt ed on wearing on PIP -and to decrease swanneck - when attempting extention of digits or tapping   Grip strength assess - in range for his age - except grip on the R   Pt wearing night time splint to decrease risk for flexor contracture on the R -and  Then review with pt RD of digits -  And add and review ulnar N glide bilateral - hand out provided  5 reps   3-4 x day   Will contact pt when anti ulnar deviation splint comes in - and fit on L hand to decrease risk for worse ulnar deviation and subluxation                OT Education - 10/18/18 1333    Education Details  correct way of wearing oval 8 splints - and add ulnar N  glide     Person(s) Educated  Patient    Methods  Demonstration;Explanation;Handout    Comprehension  Verbalized understanding;Returned demonstration          OT Long Term Goals - 10/06/18 1500      OT LONG TERM GOAL #1   Title  Pt to be independent in use of splints to decrease swanneck deformities, flexor contracture on R hand , and anti ulnar deviation splint on L     Baseline  no knowledge     Time  8    Period  Weeks    Status  New    Target Date  12/08/18      OT LONG TERM GOAL #2   Title  Pt to be ind in HEP for ROM to decrease contracture risk and decrease in motion and fucntion     Baseline  no knowledge     Time  4    Period  Weeks    Status  New    Target Date  11/03/18            Plan - 10/18/18 1334    Clinical Impression Statement  Pt to cont with oval 8 splint on 3rd thru 5th to decrease swanneck deformities - did had to reed pt on wearing it correctly - night time splint on R hand to prevent flexor contracture -and ten pt report  numbness in bilateral 5th is better -still pins and needles  but negative Tinel this  date - pt to cont wearing elbow pads and add this date ulnar N glide - will contact pt when anti ulnar deviation splint  arrive to  wear during day on L hand      Occupational performance deficits (Please refer to evaluation for details):  ADL's;IADL's;Rest and Sleep;Leisure    Rehab Potential  Fair    Current Impairments/barriers affecting progress:  long standing , chronic condition - had in past cubital tunnel     OT Frequency  Biweekly    OT Duration  8 weeks    OT Treatment/Interventions  Patient/family education;Splinting;Therapeutic exercise;Manual Therapy    Plan  assess splint use - and HEP for ROM and fit anti ulnar deviation splint on L     Clinical Decision Making  Several treatment options, min-mod task modification necessary    OT Home Exercise Plan  see pt instruction     Consulted and Agree with Plan of Care  Patient       Patient will benefit from skilled therapeutic intervention in order to improve the following deficits and impairments:  Impaired flexibility, Decreased knowledge of use of DME, Decreased coordination, Impaired UE functional use  Visit Diagnosis: Swan-neck deformity of left finger(s)  Swan-neck deformity of right finger(s)  Stiffness of left hand, not elsewhere classified  Stiffness of right hand, not elsewhere classified    Problem List Patient Active Problem List   Diagnosis Date Noted  . Systolic dysfunction 40/07/6760  . Primary osteoarthritis of left knee 07/28/2017  . Callus of foot 07/27/2016  . Nail dystrophy 07/27/2016  . Arthropathia 06/09/2015  . Neuropathic ulnar nerve 06/09/2015  . Glaucoma   . Hypothyroid   . Pure hypercholesterolemia   . Hypertension, benign   . Arthritis, multiple joint involvement   . BPH (benign prostatic hyperplasia)   . ED (erectile dysfunction)   . Nocturia associated with benign prostatic hypertrophy     Rosalyn Gess OTR/L,CLT 10/18/2018, 1:38 PM  [facility] [address], [place], [address] Phone: [phone]   Fax:  [phone]  Name: Daniel Carr MRN: [id number] Date of Birth: March 13, 1935

## 2018-10-23 ENCOUNTER — Other Ambulatory Visit: Payer: Self-pay | Admitting: Family Medicine

## 2018-10-23 DIAGNOSIS — H401132 Primary open-angle glaucoma, bilateral, moderate stage: Secondary | ICD-10-CM | POA: Diagnosis not present

## 2018-11-01 ENCOUNTER — Encounter: Payer: Self-pay | Admitting: Family Medicine

## 2018-11-01 ENCOUNTER — Ambulatory Visit (INDEPENDENT_AMBULATORY_CARE_PROVIDER_SITE_OTHER): Payer: Medicare HMO | Admitting: Family Medicine

## 2018-11-01 VITALS — BP 126/70 | HR 78 | Temp 98.1°F | Wt 205.0 lb

## 2018-11-01 DIAGNOSIS — R748 Abnormal levels of other serum enzymes: Secondary | ICD-10-CM

## 2018-11-01 DIAGNOSIS — R05 Cough: Secondary | ICD-10-CM

## 2018-11-01 DIAGNOSIS — J069 Acute upper respiratory infection, unspecified: Secondary | ICD-10-CM

## 2018-11-01 DIAGNOSIS — R059 Cough, unspecified: Secondary | ICD-10-CM

## 2018-11-01 MED ORDER — HYDROCODONE-HOMATROPINE 5-1.5 MG/5ML PO SYRP: 5.0000 mL | ORAL_SOLUTION | Freq: Three times a day (TID) | ORAL | 0 refills | Status: DC | PRN

## 2018-11-01 NOTE — Progress Notes (Addendum)
Patient: Daniel Carr Male    DOB: 01-11-1935   83 y.o.   MRN: [id number] Visit Date: 11/01/2018  Today's Provider: Lelon Huh, MD   Chief Complaint  Patient presents with  . URI    Started about two weeks ago.   Subjective:    URI   This is a new problem. Episode onset: 2 weeks. The problem has been unchanged. Associated symptoms include congestion, coughing, rhinorrhea and sinus pain. Pertinent negatives include no abdominal pain, chest pain, ear pain, headaches, plugged ear sensation, sore throat, swollen glands, vomiting or wheezing. He has tried decongestant for the symptoms. The treatment provided no relief.  tried mucinex 12, delsym, and Allegra. Cough is productive clear mucous. Is waking him at night, but mild during day. No fever, shortness of breath, wheezing, or sweats.   No Known Allergies   Current Outpatient Medications:  .  aspirin 81 MG tablet, Take 81 mg by mouth daily., Disp: , Rfl:  .  brimonidine (ALPHAGAN) 0.2 % ophthalmic solution, , Disp: , Rfl:  .  dutasteride (AVODART) 0.5 MG capsule, TAKE 1 CAPSULE BY MOUTH ONCE DAILY, Disp: 30 capsule, Rfl: 11 .  ibuprofen (ADVIL,MOTRIN) 200 MG tablet, Take 1 tablet by mouth every 6 (six) hours as needed., Disp: , Rfl:  .  ipratropium (ATROVENT) 0.06 % nasal spray, , Disp: , Rfl: 6 .  lovastatin (MEVACOR) 40 MG tablet, TAKE 1 TABLET BY MOUTH ONCE DAILY, Disp: 90 tablet, Rfl: 4 .  Multiple Vitamin (DAILY VITAMIN PO), Take by mouth., Disp: , Rfl:  .  sildenafil (REVATIO) 20 MG tablet, 3-5 tablets as needed prior to intercourse, not to exceed 5 tablets in a day, Disp: 30 tablet, Rfl: 5 .  SYNTHROID 125 MCG tablet, TAKE ONE TABLET BY MOUTH ONCE DAILY, Disp: 30 tablet, Rfl: 12 .  TRAVATAN Z 0.004 % SOLN ophthalmic solution, , Disp: , Rfl:  .  hydrochlorothiazide (HYDRODIURIL) 25 MG tablet, Take 0.5 tablets (12.5 mg total) by mouth daily. (Patient not taking: Reported on 11/01/2018), Disp: 1 tablet, Rfl: 0  Review of  Systems  Constitutional: Positive for fatigue. Negative for activity change, appetite change, chills, diaphoresis, fever and unexpected weight change.  HENT: Positive for congestion, postnasal drip, rhinorrhea, sinus pressure and sinus pain. Negative for dental problem, ear discharge, ear pain, sore throat, trouble swallowing and voice change.   Eyes: Positive for discharge and itching. Negative for photophobia, pain, redness and visual disturbance.  Respiratory: Positive for cough. Negative for apnea, choking, chest tightness, shortness of breath, wheezing and stridor.   Cardiovascular: Negative for chest pain.  Gastrointestinal: Negative.  Negative for abdominal pain and vomiting.  Allergic/Immunologic: Positive for environmental allergies.  Neurological: Negative for dizziness, light-headedness and headaches.    Social History   Tobacco Use  . Smoking status: Former Research scientist (life sciences)  . Smokeless tobacco: Never Used  . Tobacco comment: quit in 1970's  Substance Use Topics  . Alcohol use: No    Alcohol/week: 0.0 standard drinks   Objective:   BP 126/70 (BP Location: Right Arm, Patient Position: Sitting, Cuff Size: Large)   Pulse 78   Temp 98.1 F (36.7 C) (Oral)   Wt 205 lb (93 kg)   SpO2 97%   BMI 27.05 kg/m   Wt Readings from Last 5 Encounters:  11/01/18 205 lb (93 kg)  07/06/18 199 lb (90.3 kg)  06/09/18 199 lb (90.3 kg)  05/10/18 199 lb (90.3 kg)  11/08/17 207 lb (93.9 kg)  Physical Exam  General Appearance:    Alert, cooperative, no distress  HENT:   ENT exam normal, no neck nodes or sinus tenderness  Eyes:    PERRL, conjunctiva/corneas clear, EOM's intact       Lungs:     Clear to auscultation bilaterally, respirations unlabored  Heart:    Regular rate and rhythm, III/VI systolic murmur RUSB  Neurologic:   Awake, alert, oriented x 3. No apparent focal neurological           defect.       Results for orders placed or performed in visit on 07/06/18  Amylase  Result  Value Ref Range   Amylase 158 (H) 31 - 124 U/L  Lipase  Result Value Ref Range   Lipase 18 13 - 78 U/L       Assessment & Plan:     1. Cough Secondary to viral URI.  - HYDROcodone-homatropine (HYCODAN) 5-1.5 MG/5ML syrup; Take 5 mLs by mouth every 8 (eight) hours as needed.  Dispense: 100 mL; Refill: 0  2. Upper respiratory tract infection, unspecified type Counseled regarding signs and symptoms of viral and bacterial respiratory infections. Advised to call or return for additional evaluation if he develops any sign of bacterial infection, or if current symptoms last longer than 10 days.   3. Elevated amylase Normal lipase. Recently rechecked by rheumatology and down to 124. No further evaluation at this time.        Lelon Huh, MD  [facility]

## 2018-11-06 ENCOUNTER — Ambulatory Visit: Payer: Medicare HMO | Admitting: Family Medicine

## 2018-11-11 DIAGNOSIS — D472 Monoclonal gammopathy: Secondary | ICD-10-CM | POA: Insufficient documentation

## 2018-11-11 NOTE — Progress Notes (Signed)
Daniel Carr  [phone] Fax:([phone]) [phone]  ID: Daniel Carr OB: Oct 01, 1935  MR#: [id number]  RNH#:[id number]  Patient Care Team: [name], MD as PCP - General (Family Medicine) Dingeldein, Remo Lipps, MD as Consulting Physician (Ophthalmology)  CHIEF COMPLAINT: MGUS  INTERVAL HISTORY: Patient is an 83 year old male who was noted to to have an elevated M spike on routine blood work for rheumatoid arthritis.  He currently feels well and is asymptomatic.  He has no neurologic complaints.  He denies any recent fevers or illnesses.  He has a good appetite and denies weight loss.  He has no chest pain or shortness of breath.  He denies any nausea, vomiting, constipation, or diarrhea.  He has no urinary complaints.  Patient feels at his baseline offers no specific complaints today.  REVIEW OF SYSTEMS:   Review of Systems  Constitutional: Negative.  Negative for fever, malaise/fatigue and weight loss.  Respiratory: Negative.  Negative for cough and shortness of breath.   Cardiovascular: Negative.  Negative for chest pain and leg swelling.  Gastrointestinal: Negative.  Negative for abdominal pain.  Genitourinary: Negative.  Negative for dysuria.  Musculoskeletal: Positive for joint pain.  Skin: Negative.  Negative for rash.  Neurological: Negative.  Negative for focal weakness, weakness and headaches.  Psychiatric/Behavioral: Negative.  The patient is not nervous/anxious.     As per HPI. Otherwise, a complete review of systems is negative.  PAST MEDICAL HISTORY: Past Medical History:  Diagnosis Date  . childhood infection    chicken pox, measels, and mumps  . Hyperlipidemia   . Hypertension   . Sinus problem   . Ulnar neuropathy     PAST SURGICAL HISTORY: Past Surgical History:  Procedure Laterality Date  . APPENDECTOMY    . CATARACT EXTRACTION Bilateral   . KNEE SURGERY  10/04/2011  . NM MYOVIEW ([place] HX)  07/21/2017   [facility]. No ischemia.   [name]  RETINAL DETACHMENT REPAIR W/ SCLERAL BUCKLE LE      FAMILY HISTORY: Family History  Problem Relation Age of Onset  . Stroke Sister   . Hypertension Mother   . Stroke Father   . Hypertension Father     ADVANCED DIRECTIVES (Y/N):  N  HEALTH MAINTENANCE: Social History   Tobacco Use  . Smoking status: Former Smoker    Packs/day: 0.25    Years: 6.00    Pack years: 1.50    Last attempt to quit: 11/13/1969    Years since quitting: 49.0  . Smokeless tobacco: Never Used  . Tobacco comment: quit in 1970's  Substance Use Topics  . Alcohol use: No    Alcohol/week: 0.0 standard drinks  . Drug use: No     Colonoscopy:  PAP:  Bone density:  Lipid panel:  No Known Allergies  Current Outpatient Medications  Medication Sig Dispense Refill  . aspirin 81 MG tablet Take 81 mg by mouth daily.    . brimonidine (ALPHAGAN) 0.2 % ophthalmic solution     . dutasteride (AVODART) 0.5 MG capsule TAKE 1 CAPSULE BY MOUTH ONCE DAILY 30 capsule 11  . HYDROcodone-homatropine (HYCODAN) 5-1.5 MG/5ML syrup Take 5 mLs by mouth every 8 (eight) hours as needed. 100 mL 0  . ipratropium (ATROVENT) 0.06 % nasal spray   6  . lovastatin (MEVACOR) 40 MG tablet TAKE 1 TABLET BY MOUTH ONCE DAILY 90 tablet 4  . Multiple Vitamin (DAILY VITAMIN PO) Take by mouth.    . SYNTHROID 125 MCG tablet TAKE ONE TABLET BY  MOUTH ONCE DAILY 30 tablet 12  . TRAVATAN Z 0.004 % SOLN ophthalmic solution     . hydrochlorothiazide (HYDRODIURIL) 25 MG tablet Take 0.5 tablets (12.5 mg total) by mouth daily. (Patient not taking: Reported on 11/01/2018) 1 tablet 0  . ibuprofen (ADVIL,MOTRIN) 200 MG tablet Take 1 tablet by mouth every 6 (six) hours as needed.    . sildenafil (REVATIO) 20 MG tablet 3-5 tablets as needed prior to intercourse, not to exceed 5 tablets in a day (Patient not taking: Reported on 11/13/2018) 30 tablet 5   No current facility-administered medications for this visit.     OBJECTIVE: Vitals:   11/13/18 1345    BP: (!) 172/101  Pulse: 77  Resp: 18  Temp: (!) 96.6 F (35.9 C)     Body mass index is 27.17 kg/m.    ECOG FS:0 - Asymptomatic  General: Well-developed, well-nourished, no acute distress. Eyes: Pink conjunctiva, anicteric sclera. HEENT: Normocephalic, moist mucous membranes, clear oropharnyx. Lungs: Clear to auscultation bilaterally. Heart: Regular rate and rhythm. No rubs, murmurs, or gallops. Abdomen: Soft, nontender, nondistended. No organomegaly noted, normoactive bowel sounds. Musculoskeletal: No edema, cyanosis, or clubbing. Neuro: Alert, answering all questions appropriately. Cranial nerves grossly intact. Skin: No rashes or petechiae noted. Psych: Normal affect. Lymphatics: No cervical, calvicular, axillary or inguinal LAD.   LAB RESULTS:  Lab Results  Component Value Date   NA 137 11/13/2018   K 4.0 11/13/2018   CL 99 11/13/2018   CO2 32 11/13/2018   GLUCOSE 90 11/13/2018   BUN 16 11/13/2018   CREATININE 0.92 11/13/2018   CALCIUM 9.5 11/13/2018   PROT 7.2 11/13/2018   ALBUMIN 4.0 11/13/2018   AST 24 11/13/2018   ALT 17 11/13/2018   ALKPHOS 39 11/13/2018   BILITOT 0.5 11/13/2018   GFRNONAA >60 11/13/2018   GFRAA >60 11/13/2018    Lab Results  Component Value Date   WBC 7.6 11/13/2018   NEUTROABS 4.6 11/13/2018   HGB 13.0 11/13/2018   HCT 41.3 11/13/2018   MCV 94.7 11/13/2018   PLT 181 11/13/2018     STUDIES: No results found.  ASSESSMENT: MGUS  PLAN:    1. MGUS: Patient's most recent M spike was 0.9 at an outside physician's office.  His kappa/lambda light chain ratio as well as IgG, IgA, and IgM are pending at time of dictation.  He has no evidence of endorgan damage.  Patient does not require a metastatic bone survey or bone marrow biopsy at this time.  Return to clinic in 3 months with repeat laboratory work and further evaluation.  I spent a total of 45 minutes face-to-face with the patient of which greater than 50% of the visit was spent  in counseling and coordination of care as detailed above.  Patient expressed understanding and was in agreement with this plan. He also understands that He can call clinic at any time with any questions, concerns, or complaints.    Lloyd Huger, MD   11/14/2018 6:47 AM

## 2018-11-13 ENCOUNTER — Other Ambulatory Visit: Payer: Self-pay

## 2018-11-13 ENCOUNTER — Encounter: Payer: Self-pay | Admitting: Oncology

## 2018-11-13 ENCOUNTER — Inpatient Hospital Stay: Payer: Medicare HMO | Attending: Oncology | Admitting: Oncology

## 2018-11-13 ENCOUNTER — Inpatient Hospital Stay: Payer: Medicare HMO

## 2018-11-13 VITALS — BP 172/101 | HR 77 | Temp 96.6°F | Resp 18 | Ht 72.05 in | Wt 200.6 lb

## 2018-11-13 DIAGNOSIS — I10 Essential (primary) hypertension: Secondary | ICD-10-CM

## 2018-11-13 DIAGNOSIS — M2550 Pain in unspecified joint: Secondary | ICD-10-CM | POA: Diagnosis not present

## 2018-11-13 DIAGNOSIS — E785 Hyperlipidemia, unspecified: Secondary | ICD-10-CM | POA: Insufficient documentation

## 2018-11-13 DIAGNOSIS — Z87891 Personal history of nicotine dependence: Secondary | ICD-10-CM

## 2018-11-13 DIAGNOSIS — Z7982 Long term (current) use of aspirin: Secondary | ICD-10-CM | POA: Insufficient documentation

## 2018-11-13 DIAGNOSIS — D472 Monoclonal gammopathy: Secondary | ICD-10-CM | POA: Insufficient documentation

## 2018-11-13 DIAGNOSIS — Z79899 Other long term (current) drug therapy: Secondary | ICD-10-CM | POA: Insufficient documentation

## 2018-11-13 LAB — COMPREHENSIVE METABOLIC PANEL
ALT: 17 U/L (ref 0–44)
AST: 24 U/L (ref 15–41)
Albumin: 4 g/dL (ref 3.5–5.0)
Alkaline Phosphatase: 39 U/L (ref 38–126)
Anion gap: 6 (ref 5–15)
BUN: 16 mg/dL (ref 8–23)
CO2: 32 mmol/L (ref 22–32)
Calcium: 9.5 mg/dL (ref 8.9–10.3)
Chloride: 99 mmol/L (ref 98–111)
Creatinine, Ser: 0.92 mg/dL (ref 0.61–1.24)
GFR calc Af Amer: >60 mL/min (ref >60)
GFR calc non Af Amer: >60 mL/min (ref >60)
Glucose, Bld: 90 mg/dL (ref 70–99)
Potassium: 4 mmol/L (ref 3.5–5.1)
Sodium: 137 mmol/L (ref 135–145)
Total Bilirubin: 0.5 mg/dL (ref 0.3–1.2)
Total Protein: 7.2 g/dL (ref 6.5–8.1)

## 2018-11-13 LAB — CBC WITH DIFFERENTIAL/PLATELET
Abs Immature Granulocytes: 0.01 10*3/uL (ref 0.00–0.07)
Basophils Absolute: 0 10*3/uL (ref 0.0–0.1)
Basophils Relative: 1 %
Eosinophils Absolute: 0.1 10*3/uL (ref 0.0–0.5)
Eosinophils Relative: 1 %
HCT: 41.3 % (ref 39.0–52.0)
Hemoglobin: 13 g/dL (ref 13.0–17.0)
Immature Granulocytes: 0 %
Lymphocytes Relative: 29 %
Lymphs Abs: 2.2 10*3/uL (ref 0.7–4.0)
MCH: 29.8 pg (ref 26.0–34.0)
MCHC: 31.5 g/dL (ref 30.0–36.0)
MCV: 94.7 fL (ref 80.0–100.0)
Monocytes Absolute: 0.7 10*3/uL (ref 0.1–1.0)
Monocytes Relative: 9 %
Neutro Abs: 4.6 10*3/uL (ref 1.7–7.7)
Neutrophils Relative %: 60 %
Platelets: 181 10*3/uL (ref 150–400)
RBC: 4.36 MIL/uL (ref 4.22–5.81)
RDW: 13.1 % (ref 11.5–15.5)
WBC: 7.6 10*3/uL (ref 4.0–10.5)
nRBC: 0 % (ref 0.0–0.2)

## 2018-11-13 NOTE — Progress Notes (Signed)
Here for new pt evaluation. Pt stated he stopped HCTZ few months ago. bp today 203/101, repeat 172/102 p 77- pt denies headache or dizziness. MD informed

## 2018-11-14 ENCOUNTER — Other Ambulatory Visit: Payer: Self-pay | Admitting: Family Medicine

## 2018-11-14 DIAGNOSIS — I10 Essential (primary) hypertension: Secondary | ICD-10-CM

## 2018-11-14 LAB — PROTEIN ELECTROPHORESIS, SERUM
A/G Ratio: 1.2 (ref 0.7–1.7)
Albumin ELP: 3.6 g/dL (ref 2.9–4.4)
Alpha-1-Globulin: 0.2 g/dL (ref 0.0–0.4)
Alpha-2-Globulin: 0.5 g/dL (ref 0.4–1.0)
Beta Globulin: 0.9 g/dL (ref 0.7–1.3)
Gamma Globulin: 1.5 g/dL (ref 0.4–1.8)
Globulin, Total: 3.1 g/dL (ref 2.2–3.9)
M-Spike, %: 0.9 g/dL — ABNORMAL HIGH
Total Protein ELP: 6.7 g/dL (ref 6.0–8.5)

## 2018-11-14 LAB — IGG, IGA, IGM
IgA: 227 mg/dL (ref 61–437)
IgG (Immunoglobin G), Serum: 1505 mg/dL (ref 700–1600)
IgM (Immunoglobulin M), Srm: 78 mg/dL (ref 15–143)

## 2018-11-14 LAB — KAPPA/LAMBDA LIGHT CHAINS
Kappa free light chain: 22.9 mg/L — ABNORMAL HIGH (ref 3.3–19.4)
Kappa, lambda light chain ratio: 1.67 — ABNORMAL HIGH (ref 0.26–1.65)
Lambda free light chains: 13.7 mg/L (ref 5.7–26.3)

## 2018-11-14 LAB — BETA 2 MICROGLOBULIN, SERUM: Beta-2 Microglobulin: 1.6 mg/L (ref 0.6–2.4)

## 2018-11-14 MED ORDER — HYDROCHLOROTHIAZIDE 25 MG PO TABS: 12.5000 mg | ORAL_TABLET | Freq: Every day | ORAL | 1 refills | Status: DC

## 2018-11-14 NOTE — Telephone Encounter (Signed)
Pt advised.  Apt made for 11/22/2018 with Adriana.   Thanks,   -Mickel Baas

## 2018-11-14 NOTE — Telephone Encounter (Signed)
-----   Message from Johney Maine, RN sent at 11/13/2018  2:01 PM EST ----- Regarding: [facility] patient-elevated BP in clinic Dr. Caryn Section,  Patient was seen as new consult by Dr. Grayland Ormond today. Patients blood pressure was elevated in our clinic today; 203/101, repeat check 172/102 and 168/100. Patient is asymptomatic and states he stopped his HCTZ about 1 month ago. Dr. Grayland Ormond wanted [place] to make you aware of readings. Thanks so much.  Tillie Rung

## 2018-11-14 NOTE — Telephone Encounter (Signed)
Yes, he needs to come in next week. I'll be gone, but he can see a PA

## 2018-11-14 NOTE — Telephone Encounter (Signed)
Pt started he will start back on HCTZ today.  He will need a refill sent to [facility] [address].  Does he need to come in for a follow up to recheck his BP?  Thanks,   -Mickel Baas

## 2018-11-14 NOTE — Telephone Encounter (Signed)
Please check with patient about his BP medications. He has stopped hctz but BP at Dr. Virgel Manifold office was very high. He either needs to get back on hctz or we can change to amlodipine if he was having side effects from hctz.

## 2018-11-22 ENCOUNTER — Ambulatory Visit (INDEPENDENT_AMBULATORY_CARE_PROVIDER_SITE_OTHER): Payer: Medicare HMO | Admitting: Physician Assistant

## 2018-11-22 ENCOUNTER — Encounter: Payer: Self-pay | Admitting: Physician Assistant

## 2018-11-22 VITALS — BP 110/60 | HR 83 | Temp 97.7°F | Resp 16 | Wt 199.8 lb

## 2018-11-22 DIAGNOSIS — I10 Essential (primary) hypertension: Secondary | ICD-10-CM | POA: Diagnosis not present

## 2018-11-22 NOTE — Progress Notes (Signed)
Acute Office Visit  Subjective:    Patient ID: Daniel Carr, male    DOB: Feb 01, 1935, 83 y.o.   MRN: [id number]  Chief Complaint  Patient presents with  . Hypertension    HPI Patient is in today for follow up on hypertension. Patient was seen on 11/01/18 by Dr. Caryn Section and BP was 126/70. Patient reports that since then BP has been elevated. He was in the oncology clinic on 11/13/2018 and his blood pressure was 172/101. He reports that he was nervous at that time as he was being worked up for malignancy. Dr. Caryn Section sent in HCTZ 12.5 mg daily and patient reports that he started taking HCTZ 12.5 mg daily about 2 weeks ago. He was previously on this medication but reports he discontinued because blood pressure was too low. Denies chest pain, SOB, headaches.   Past Medical History:  Diagnosis Date  . childhood infection    chicken pox, measels, and mumps  . Hyperlipidemia   . Hypertension   . Sinus problem   . Ulnar neuropathy     Past Surgical History:  Procedure Laterality Date  . APPENDECTOMY    . CATARACT EXTRACTION Bilateral   . KNEE SURGERY  10/04/2011  . NM MYOVIEW ([place] HX)  07/21/2017   [facility]. No ischemia.   [name] RETINAL DETACHMENT REPAIR W/ SCLERAL BUCKLE LE      Family History  Problem Relation Age of Onset  . Stroke Sister   . Hypertension Mother   . Stroke Father   . Hypertension Father     Social History   Socioeconomic History  . Marital status: Married    Spouse name: Not on file  . Number of children: 1  . Years of education: college  . Highest education level: Not on file  Occupational History  . Occupation: Retired  Scientific laboratory technician  . Financial resource strain: Not on file  . Food insecurity:    Worry: Not on file    Inability: Not on file  . Transportation needs:    Medical: Not on file    Non-medical: Not on file  Tobacco Use  . Smoking status: Former Smoker    Packs/day: 0.25    Years: 6.00    Pack years: 1.50    Last attempt to quit:  11/13/1969    Years since quitting: 49.0  . Smokeless tobacco: Never Used  . Tobacco comment: quit in 1970's  Substance and Sexual Activity  . Alcohol use: No    Alcohol/week: 0.0 standard drinks  . Drug use: No  . Sexual activity: Yes  Lifestyle  . Physical activity:    Days per week: Not on file    Minutes per session: Not on file  . Stress: Not on file  Relationships  . Social connections:    Talks on phone: Not on file    Gets together: Not on file    Attends religious service: Not on file    Active member of club or organization: Not on file    Attends meetings of clubs or organizations: Not on file    Relationship status: Not on file  . Intimate partner violence:    Fear of current or ex partner: Not on file    Emotionally abused: Not on file    Physically abused: Not on file    Forced sexual activity: Not on file  Other Topics Concern  . Not on file  Social History Narrative  . Not on file    Outpatient  Medications Prior to Visit  Medication Sig Dispense Refill  . aspirin 81 MG tablet Take 81 mg by mouth daily.    . brimonidine (ALPHAGAN) 0.2 % ophthalmic solution     . dutasteride (AVODART) 0.5 MG capsule TAKE 1 CAPSULE BY MOUTH ONCE DAILY 30 capsule 11  . hydrochlorothiazide (HYDRODIURIL) 25 MG tablet Take 0.5 tablets (12.5 mg total) by mouth daily. 30 tablet 1  . ibuprofen (ADVIL,MOTRIN) 200 MG tablet Take 1 tablet by mouth every 6 (six) hours as needed.    [name] ipratropium (ATROVENT) 0.06 % nasal spray   6  . lovastatin (MEVACOR) 40 MG tablet TAKE 1 TABLET BY MOUTH ONCE DAILY 90 tablet 4  . Multiple Vitamin (DAILY VITAMIN PO) Take by mouth.    . SYNTHROID 125 MCG tablet TAKE ONE TABLET BY MOUTH ONCE DAILY 30 tablet 12  . TRAVATAN Z 0.004 % SOLN ophthalmic solution     . HYDROcodone-homatropine (HYCODAN) 5-1.5 MG/5ML syrup Take 5 mLs by mouth every 8 (eight) hours as needed. (Patient not taking: Reported on 11/22/2018) 100 mL 0  . sildenafil (REVATIO) 20 MG tablet  3-5 tablets as needed prior to intercourse, not to exceed 5 tablets in a day (Patient not taking: Reported on 11/13/2018) 30 tablet 5   No facility-administered medications prior to visit.     No Known Allergies  Review of Systems  Constitutional: Negative.   Cardiovascular: Negative.        Objective:    Physical Exam  Constitutional: He is oriented to person, place, and time. He appears well-developed and well-nourished.  Cardiovascular: Normal rate and regular rhythm.  Pulmonary/Chest: Effort normal and breath sounds normal.  Neurological: He is alert and oriented to person, place, and time.  Skin: Skin is warm and dry.  Psychiatric: He has a normal mood and affect. His behavior is normal.    BP 110/60 (BP Location: Left Arm, Patient Position: Sitting, Cuff Size: Normal)   Pulse 83   Temp 97.7 F (36.5 C) (Oral)   Resp 16   Wt 199 lb 12.8 oz (90.6 kg)   SpO2 98%   BMI 27.06 kg/m  Wt Readings from Last 3 Encounters:  11/22/18 199 lb 12.8 oz (90.6 kg)  11/13/18 200 lb 9.6 oz (91 kg)  11/01/18 205 lb (93 kg)    There are no preventive care reminders to display for this patient.  There are no preventive care reminders to display for this patient.   Lab Results  Component Value Date   TSH 1.870 05/10/2018   Lab Results  Component Value Date   WBC 7.6 11/13/2018   HGB 13.0 11/13/2018   HCT 41.3 11/13/2018   MCV 94.7 11/13/2018   PLT 181 11/13/2018   Lab Results  Component Value Date   NA 137 11/13/2018   K 4.0 11/13/2018   CO2 32 11/13/2018   GLUCOSE 90 11/13/2018   BUN 16 11/13/2018   CREATININE 0.92 11/13/2018   BILITOT 0.5 11/13/2018   ALKPHOS 39 11/13/2018   AST 24 11/13/2018   ALT 17 11/13/2018   PROT 7.2 11/13/2018   ALBUMIN 4.0 11/13/2018   CALCIUM 9.5 11/13/2018   ANIONGAP 6 11/13/2018   Lab Results  Component Value Date   CHOL 165 05/10/2018   Lab Results  Component Value Date   HDL 79 05/10/2018   Lab Results  Component Value  Date   LDLCALC 73 05/10/2018   Lab Results  Component Value Date   TRIG 64 05/10/2018  Lab Results  Component Value Date   CHOLHDL 2.1 05/10/2018   No results found for: HGBA1C     Assessment & Plan:  1. Hypertension, benign  Blood pressure much improved. Continue HCTZ 12.5 mg daily. Monitor for hypotension. He is following up with Dr. Caryn Section in 6 months.   The entirety of the information documented in the History of Present Illness, Review of Systems and Physical Exam were personally obtained by me. Portions of this information were initially documented by Lynford Humphrey, CMA and reviewed by me for thoroughness and accuracy.   Return in about 6 months (around 05/23/2019) for Dr Caryn Section, HTN.   Trinna Post, PA-C

## 2018-11-22 NOTE — Patient Instructions (Signed)

## 2019-01-03 ENCOUNTER — Encounter: Payer: Self-pay | Admitting: Family Medicine

## 2019-01-03 ENCOUNTER — Ambulatory Visit (INDEPENDENT_AMBULATORY_CARE_PROVIDER_SITE_OTHER): Payer: Medicare HMO | Admitting: Family Medicine

## 2019-01-03 VITALS — BP 122/80 | HR 76 | Temp 98.1°F | Resp 16 | Wt 202.0 lb

## 2019-01-03 DIAGNOSIS — I10 Essential (primary) hypertension: Secondary | ICD-10-CM

## 2019-01-03 MED ORDER — AMLODIPINE BESYLATE 2.5 MG PO TABS: 2.5000 mg | ORAL_TABLET | Freq: Every day | ORAL | 3 refills | Status: DC

## 2019-01-03 NOTE — Progress Notes (Signed)
Patient: Daniel Carr Male    DOB: 07-Jan-1935   84 y.o.   MRN: [id number] Visit Date: 01/03/2019  Today's Provider: Lelon Huh, MD   Chief Complaint  Patient presents with  . Hypertension   Subjective:     HPI  Hypertension, follow-up:  BP Readings from Last 3 Encounters:  01/03/19 122/80  11/22/18 110/60  11/13/18 (!) 172/101    He was last seen for hypertension 6 weeks ago (seen by [place]). He had been started on 12.5mg  hctz prior to that visit, and BP at that visit was 110/60. Management since that visit includes no changes. He reports good compliance with treatment. He is having side effects. Patient states his blood pressure has been running high. He is not exercising. He is adherent to low salt diet.   Outside blood pressures are consistently elevated in the afternoons and evenings. He brings rcords of BP with systolics ranging 220U to 542 and diastolics in 70W-237S. SBP is usually better in the morning, often in 120s. [name] He is experiencing none.  Patient denies chest pain, chest pressure/discomfort, claudication, dyspnea, exertional chest pressure/discomfort, fatigue, irregular heart beat, lower extremity edema, near-syncope, orthopnea, palpitations, paroxysmal nocturnal dyspnea, syncope and tachypnea.   Cardiovascular risk factors include advanced age (older than 30 for men, 38 for women), hypertension and male gender.  Use of agents associated with hypertension: NSAIDS.     Weight trend: stable Wt Readings from Last 3 Encounters:  01/03/19 202 lb (91.6 kg)  11/22/18 199 lb 12.8 oz (90.6 kg)  11/13/18 200 lb 9.6 oz (91 kg)    Current diet: in general, a "healthy" diet    ------------------------------------------------------------------------  No Known Allergies   Current Outpatient Medications:  .  aspirin 81 MG tablet, Take 81 mg by mouth daily., Disp: , Rfl:  .  brimonidine (ALPHAGAN) 0.2 % ophthalmic solution, , Disp: , Rfl:  .  dutasteride  (AVODART) 0.5 MG capsule, TAKE 1 CAPSULE BY MOUTH ONCE DAILY, Disp: 30 capsule, Rfl: 11 .  hydrochlorothiazide (HYDRODIURIL) 25 MG tablet, Take 0.5 tablets (12.5 mg total) by mouth daily., Disp: 30 tablet, Rfl: 1 .  ibuprofen (ADVIL,MOTRIN) 200 MG tablet, Take 1 tablet by mouth every 6 (six) hours as needed., Disp: , Rfl:  (not taking) .  lovastatin (MEVACOR) 40 MG tablet, TAKE 1 TABLET BY MOUTH ONCE DAILY, Disp: 90 tablet, Rfl: 4 .  Multiple Vitamin (DAILY VITAMIN PO), Take by mouth., Disp: , Rfl:  .  SYNTHROID 125 MCG tablet, TAKE ONE TABLET BY MOUTH ONCE DAILY, Disp: 30 tablet, Rfl: 12 .  TRAVATAN Z 0.004 % SOLN ophthalmic solution, , Disp: , Rfl:  .  ipratropium (ATROVENT) 0.06 % nasal spray, , Disp: , Rfl: 6  Review of Systems  Constitutional: Negative for appetite change, chills and fever.  Respiratory: Negative for chest tightness, shortness of breath and wheezing.   Cardiovascular: Negative for chest pain and palpitations.  Gastrointestinal: Negative for abdominal pain, nausea and vomiting.    Social History   Tobacco Use  . Smoking status: Former Smoker    Packs/day: 0.25    Years: 6.00    Pack years: 1.50    Last attempt to quit: 11/13/1969    Years since quitting: 49.1  . Smokeless tobacco: Never Used  . Tobacco comment: quit in 1970's  Substance Use Topics  . Alcohol use: No    Alcohol/week: 0.0 standard drinks      Objective:   BP 122/80 (BP Location:  Left Arm, Patient Position: Sitting, Cuff Size: Large)   Pulse 76   Temp 98.1 F (36.7 C) (Oral)   Resp 16   Wt 202 lb (91.6 kg)   SpO2 98% Comment: room air  BMI 27.36 kg/m  Vitals:   01/03/19 0821  BP: 122/80  Pulse: 76  Resp: 16  Temp: 98.1 F (36.7 C)  TempSrc: Oral  SpO2: 98%  Weight: 202 lb (91.6 kg)     Physical Exam   General Appearance:    Alert, cooperative, no distress  Eyes:    PERRL, conjunctiva/corneas clear, EOM's intact       Lungs:     Clear to auscultation bilaterally,  respirations unlabored  Heart:    Regular rate and rhythm  Neurologic:   Awake, alert, oriented x 3. No apparent focal neurological           defect.          Assessment & Plan    1. Hypertension, benign Labile bp, but having frequent systolic BP at home in the 160s to 180s. Will add - amLODipine (NORVASC) 2.5 MG tablet; Take 1 tablet (2.5 mg total) by mouth daily.  Dispense: 30 tablet; Refill: 3  Counseled to avoid NSAIDs and salty foods.  Recheck BP in 4 weeks.     Lelon Huh, MD  [facility]

## 2019-01-03 NOTE — Patient Instructions (Signed)
.   Please bring all of your medications to every appointment so we can make sure that our medication list is the same as yours.   

## 2019-01-18 ENCOUNTER — Other Ambulatory Visit: Payer: Self-pay | Admitting: Family Medicine

## 2019-01-18 DIAGNOSIS — I10 Essential (primary) hypertension: Secondary | ICD-10-CM

## 2019-01-18 MED ORDER — HYDROCHLOROTHIAZIDE 25 MG PO TABS: 12.5000 mg | ORAL_TABLET | Freq: Every day | ORAL | 12 refills | Status: DC

## 2019-01-18 NOTE — Telephone Encounter (Signed)
[facility] faxed refill request for the following medications:  hydrochlorothiazide (HYDRODIURIL) 25 MG tablet  Please advise. Thanks TNP

## 2019-01-31 ENCOUNTER — Encounter: Payer: Self-pay | Admitting: Family Medicine

## 2019-01-31 ENCOUNTER — Ambulatory Visit
Admission: RE | Admit: 2019-01-31 | Discharge: 2019-01-31 | Disposition: A | Discharge status: Home / Self Care | Payer: Medicare HMO | Source: Ambulatory Visit | Attending: Family Medicine | Admitting: Family Medicine

## 2019-01-31 ENCOUNTER — Telehealth: Payer: Self-pay | Admitting: Family Medicine

## 2019-01-31 ENCOUNTER — Ambulatory Visit (INDEPENDENT_AMBULATORY_CARE_PROVIDER_SITE_OTHER): Payer: Medicare HMO | Admitting: Family Medicine

## 2019-01-31 VITALS — BP 119/67 | HR 70 | Temp 97.9°F | Resp 16 | Ht 72.0 in | Wt 204.0 lb

## 2019-01-31 DIAGNOSIS — I10 Essential (primary) hypertension: Secondary | ICD-10-CM

## 2019-01-31 DIAGNOSIS — R05 Cough: Secondary | ICD-10-CM | POA: Diagnosis not present

## 2019-01-31 DIAGNOSIS — R059 Cough, unspecified: Secondary | ICD-10-CM

## 2019-01-31 DIAGNOSIS — J42 Unspecified chronic bronchitis: Secondary | ICD-10-CM | POA: Insufficient documentation

## 2019-01-31 NOTE — Telephone Encounter (Signed)
Xray shows chronic bronchitis. Need to try samples of Stiolto Respimat inhaler 2.76mcg two puffs once a day and follow up 3-4 weeks.

## 2019-01-31 NOTE — Progress Notes (Signed)
Patient: Daniel Carr Male    DOB: December 17, 1935   84 y.o.   MRN: [id number] Visit Date: 01/31/2019  Today's Provider: Lelon Huh, MD   Chief Complaint  Patient presents with  . Hypertension    One month follow up   Subjective:     HPI    Hypertension, follow-up:  BP Readings from Last 3 Encounters:  01/31/19 119/67  01/03/19 122/80  11/22/18 110/60    He was last seen for hypertension 4 weeks ago.  BP at that visit was 122/20, but he reports home blood pressures were frequently over 160.  Management since that visit includes adding Amlodipine 2.5mg  daily. He reports excellent compliance with treatment. He is not having side effects.     He reports home BP have been better, usually under 140.  Patient denies chest pain, exertional chest pressure/discomfort, lower extremity edema and palpitations.   Cardiovascular risk factors include advanced age (older than 18 for men, 8 for women), hypertension and male gender.  Use of agents associated with hypertension: none.     Weight trend: stable Wt Readings from Last 3 Encounters:  01/31/19 204 lb (92.5 kg)  01/03/19 202 lb (91.6 kg)  11/22/18 199 lb 12.8 oz (90.6 kg)    Current diet: in general, a "healthy" diet    ------------------------------------------------------------------------  He also states he has had persistent dry cough for several months, started around the time he had URI in November, but never completely resolved. He gets a little short of breath with exertion, but no worse than usual. No fevers, chills, or sweats.   No Known Allergies   Current Outpatient Medications:  .  amLODipine (NORVASC) 2.5 MG tablet, Take 1 tablet (2.5 mg total) by mouth daily., Disp: 30 tablet, Rfl: 3 .  aspirin 81 MG tablet, Take 81 mg by mouth daily., Disp: , Rfl:  .  brimonidine (ALPHAGAN) 0.2 % ophthalmic solution, , Disp: , Rfl:  .  dutasteride (AVODART) 0.5 MG capsule, TAKE 1 CAPSULE BY MOUTH ONCE DAILY, Disp:  30 capsule, Rfl: 11 .  hydrochlorothiazide (HYDRODIURIL) 25 MG tablet, Take 0.5 tablets (12.5 mg total) by mouth daily., Disp: 30 tablet, Rfl: 12 .  ipratropium (ATROVENT) 0.06 % nasal spray, , Disp: , Rfl: 6 .  lovastatin (MEVACOR) 40 MG tablet, TAKE 1 TABLET BY MOUTH ONCE DAILY, Disp: 90 tablet, Rfl: 4 .  SYNTHROID 125 MCG tablet, TAKE ONE TABLET BY MOUTH ONCE DAILY, Disp: 30 tablet, Rfl: 12 .  TRAVATAN Z 0.004 % SOLN ophthalmic solution, , Disp: , Rfl:  .  Multiple Vitamin (DAILY VITAMIN PO), Take by mouth., Disp: , Rfl:   Review of Systems  Constitutional: Negative.   Respiratory: Negative.   Cardiovascular: Negative.   Gastrointestinal: Positive for constipation. Negative for abdominal distention, abdominal pain, anal bleeding, blood in stool, diarrhea, nausea, rectal pain and vomiting.  Neurological: Negative for dizziness, light-headedness and headaches.    Social History   Tobacco Use  . Smoking status: Former Smoker    Packs/day: 0.25    Years: 6.00    Pack years: 1.50    Last attempt to quit: 11/13/1969    Years since quitting: 49.2  . Smokeless tobacco: Never Used  . Tobacco comment: quit in 1970's  Substance Use Topics  . Alcohol use: No    Alcohol/week: 0.0 standard drinks      Objective:   BP 119/67 (BP Location: Right Arm, Patient Position: Sitting, Cuff Size: Normal)   Pulse  70   Temp 97.9 F (36.6 C) (Oral)   Resp 16   Ht 6' (1.829 m)   Wt 204 lb (92.5 kg)   BMI 27.67 kg/m  Vitals:   01/31/19 0850  BP: 119/67  Pulse: 70  Resp: 16  Temp: 97.9 F (36.6 C)  TempSrc: Oral  Weight: 204 lb (92.5 kg)  Height: 6' (1.829 m)     Physical Exam   General Appearance:    Alert, cooperative, no distress  Eyes:    PERRL, conjunctiva/corneas clear, EOM's intact       Lungs:     Clear to auscultation bilaterally, respirations unlabored  Heart:    Regular rate and rhythm, III/VI systolic murmur.   Neurologic:   Awake, alert, oriented x 3. No apparent focal  neurological           defect.          Assessment & Plan    1. Hypertension, benign Doing well with addition of low dose amlodipine, home BP reportedly improved. Continue current medications.    2. Cough Persistent since around November.  - DG Chest 2 View; Future     Lelon Huh, MD  [facility]

## 2019-01-31 NOTE — Patient Instructions (Addendum)
.   Please review the attached list of medications and notify my office if there are any errors.   Daniel Carr to the [facility] on [address] [facility] for chest Xray

## 2019-02-01 NOTE — Telephone Encounter (Signed)
Pt advised.  Sample at the front desk.  Apt made for 02/19/2019.   Thanks,   -Mickel Baas

## 2019-02-19 ENCOUNTER — Encounter: Payer: Self-pay | Admitting: Family Medicine

## 2019-02-19 ENCOUNTER — Ambulatory Visit (INDEPENDENT_AMBULATORY_CARE_PROVIDER_SITE_OTHER): Payer: Medicare HMO | Admitting: Family Medicine

## 2019-02-19 ENCOUNTER — Inpatient Hospital Stay: Payer: Medicare HMO | Attending: Oncology

## 2019-02-19 VITALS — BP 122/70 | HR 63 | Temp 97.9°F | Resp 18 | Ht 72.0 in | Wt 207.0 lb

## 2019-02-19 DIAGNOSIS — J309 Allergic rhinitis, unspecified: Secondary | ICD-10-CM | POA: Diagnosis not present

## 2019-02-19 DIAGNOSIS — D472 Monoclonal gammopathy: Secondary | ICD-10-CM | POA: Diagnosis not present

## 2019-02-19 DIAGNOSIS — J410 Simple chronic bronchitis: Secondary | ICD-10-CM | POA: Diagnosis not present

## 2019-02-19 LAB — CBC WITH DIFFERENTIAL/PLATELET
Abs Immature Granulocytes: 0.01 10*3/uL (ref 0.00–0.07)
Basophils Absolute: 0 10*3/uL (ref 0.0–0.1)
Basophils Relative: 1 %
Eosinophils Absolute: 0.1 10*3/uL (ref 0.0–0.5)
Eosinophils Relative: 1 %
HCT: 39.8 % (ref 39.0–52.0)
Hemoglobin: 12.9 g/dL — ABNORMAL LOW (ref 13.0–17.0)
Immature Granulocytes: 0 %
Lymphocytes Relative: 36 %
Lymphs Abs: 2.2 10*3/uL (ref 0.7–4.0)
MCH: 30.3 pg (ref 26.0–34.0)
MCHC: 32.4 g/dL (ref 30.0–36.0)
MCV: 93.4 fL (ref 80.0–100.0)
Monocytes Absolute: 0.5 10*3/uL (ref 0.1–1.0)
Monocytes Relative: 8 %
Neutro Abs: 3.3 10*3/uL (ref 1.7–7.7)
Neutrophils Relative %: 54 %
Platelets: 189 10*3/uL (ref 150–400)
RBC: 4.26 MIL/uL (ref 4.22–5.81)
RDW: 13.1 % (ref 11.5–15.5)
WBC: 6.1 10*3/uL (ref 4.0–10.5)
nRBC: 0 % (ref 0.0–0.2)

## 2019-02-19 LAB — COMPREHENSIVE METABOLIC PANEL
ALT: 18 U/L (ref 0–44)
AST: 24 U/L (ref 15–41)
Albumin: 4.1 g/dL (ref 3.5–5.0)
Alkaline Phosphatase: 40 U/L (ref 38–126)
Anion gap: 5 (ref 5–15)
BUN: 18 mg/dL (ref 8–23)
CO2: 31 mmol/L (ref 22–32)
Calcium: 9.2 mg/dL (ref 8.9–10.3)
Chloride: 100 mmol/L (ref 98–111)
Creatinine, Ser: 1.12 mg/dL (ref 0.61–1.24)
GFR calc Af Amer: >60 mL/min (ref >60)
GFR calc non Af Amer: >60 mL/min (ref >60)
Glucose, Bld: 92 mg/dL (ref 70–99)
Potassium: 3.5 mmol/L (ref 3.5–5.1)
Sodium: 136 mmol/L (ref 135–145)
Total Bilirubin: 0.7 mg/dL (ref 0.3–1.2)
Total Protein: 7.1 g/dL (ref 6.5–8.1)

## 2019-02-19 MED ORDER — TIOTROPIUM BROMIDE-OLODATEROL 2.5-2.5 MCG/ACT IN AERS: 2.0000 | INHALATION_SPRAY | Freq: Every day | RESPIRATORY_TRACT | 1 refills | Status: AC

## 2019-02-19 MED ORDER — MONTELUKAST SODIUM 10 MG PO TABS: 10.0000 mg | ORAL_TABLET | Freq: Every day | ORAL | 11 refills | Status: DC

## 2019-02-19 NOTE — Patient Instructions (Signed)
.   Please review the attached list of medications and notify my office if there are any errors.   . Start taking montelukast (Singulair) one tablet every day for allergies and chronic bronchitis and take it year around   Continue Stiolto for one more month. If you stay on the montelukast, you should be able to stop the Stiolto after a month.

## 2019-02-19 NOTE — Progress Notes (Signed)
Patient: Daniel Carr Male    DOB: Oct 21, 1935   84 y.o.   MRN: [id number] Visit Date: 02/19/2019  Today's Provider: Lelon Huh, MD   Chief Complaint  Patient presents with  . Follow-up   Subjective:     HPI  Follow up for Bronchitis:  The patient was last seen for this 3 weeks ago. Chest xray at that time showed changes of chronic bronchitis.  Changes made at last visit include starting samples of Stiolto Respimat inhaler.  He reports good compliance with treatment. He feels that condition is Improved. He is not having side effects. Cough has significantly improved.  He reports long history of allergies, previously on allergy shots. Still has persistent nasal drainage and congestion year around.  He does have remote history of smoking, but quit back in the 1970s.  ------------------------------------------------------------------------------------  No Known Allergies   Current Outpatient Medications:  .  amLODipine (NORVASC) 2.5 MG tablet, Take 1 tablet (2.5 mg total) by mouth daily., Disp: 30 tablet, Rfl: 3 .  aspirin 81 MG tablet, Take 81 mg by mouth daily., Disp: , Rfl:  .  brimonidine (ALPHAGAN) 0.2 % ophthalmic solution, , Disp: , Rfl:  .  dutasteride (AVODART) 0.5 MG capsule, TAKE 1 CAPSULE BY MOUTH ONCE DAILY, Disp: 30 capsule, Rfl: 11 .  hydrochlorothiazide (HYDRODIURIL) 25 MG tablet, Take 0.5 tablets (12.5 mg total) by mouth daily., Disp: 30 tablet, Rfl: 12 .  ipratropium (ATROVENT) 0.06 % nasal spray, , Disp: , Rfl: 6 .  lovastatin (MEVACOR) 40 MG tablet, TAKE 1 TABLET BY MOUTH ONCE DAILY, Disp: 90 tablet, Rfl: 4 .  Multiple Vitamin (DAILY VITAMIN PO), Take by mouth., Disp: , Rfl:  .  SYNTHROID 125 MCG tablet, TAKE ONE TABLET BY MOUTH ONCE DAILY, Disp: 30 tablet, Rfl: 12 .  Tiotropium Bromide-Olodaterol (STIOLTO RESPIMAT) 2.5-2.5 MCG/ACT AERS, Inhale 2 puffs into the lungs daily., Disp: , Rfl:  .  TRAVATAN Z 0.004 % SOLN ophthalmic solution, , Disp: ,  Rfl:   Review of Systems  Constitutional: Negative for appetite change, chills and fever.  Respiratory: Positive for cough. Negative for chest tightness, shortness of breath and wheezing.   Cardiovascular: Negative for chest pain and palpitations.  Gastrointestinal: Negative for abdominal pain, nausea and vomiting.    Social History   Tobacco Use  . Smoking status: Former Smoker    Packs/day: 0.25    Years: 6.00    Pack years: 1.50    Last attempt to quit: 11/13/1969    Years since quitting: 49.3  . Smokeless tobacco: Never Used  . Tobacco comment: quit in 1970's  Substance Use Topics  . Alcohol use: No    Alcohol/week: 0.0 standard drinks      Objective:   BP 122/70 (BP Location: Left Arm, Patient Position: Sitting, Cuff Size: Large)   Pulse 63   Temp 97.9 F (36.6 C) (Oral)   Resp 18   Ht 6' (1.829 m)   Wt 207 lb (93.9 kg)   SpO2 99% Comment: room air  BMI 28.07 kg/m     Physical Exam   General Appearance:    Alert, cooperative, no distress  Eyes:    PERRL, conjunctiva/corneas clear, EOM's intact       Lungs:     Clear to auscultation bilaterally, respirations unlabored  Heart:    Regular rate and rhythm  Neurologic:   Awake, alert, oriented x 3. No apparent focal neurological  defect.          Assessment & Plan    1. Simple chronic bronchitis (Circle) Either post infectious or secondary to chronic allergies as below. Greatly improved on Tiotropium Bromide-Olodaterol (STIOLTO RESPIMAT) 2.5-2.5 MCG/ACT AERS; Inhale 2 puffs into the lungs daily.  Dispense: 1 Inhaler; Refill: 1  2. Allergic rhinitis, unspecified seasonality, unspecified trigger Start  montelukast (SINGULAIR) 10 MG tablet; Take 1 tablet (10 mg total) by mouth at bedtime.  Dispense: 30 tablet; Refill: 11 Continue  Tiotropium Bromide-Olodaterol (STIOLTO RESPIMAT) 2.5-2.5 MCG/ACT AERS; Inhale 2 puffs into the lungs daily.  Dispense: 1 Inhaler; Refill: 1     Lelon Huh, MD  [facility]

## 2019-02-20 ENCOUNTER — Other Ambulatory Visit: Payer: Self-pay | Admitting: Family Medicine

## 2019-02-20 LAB — IGG, IGA, IGM
IgA: 185 mg/dL (ref 61–437)
IgG (Immunoglobin G), Serum: 1407 mg/dL (ref 700–1600)
IgM (Immunoglobulin M), Srm: 72 mg/dL (ref 15–143)

## 2019-02-20 LAB — KAPPA/LAMBDA LIGHT CHAINS
Kappa free light chain: 23.6 mg/L — ABNORMAL HIGH (ref 3.3–19.4)
Kappa, lambda light chain ratio: 2.09 — ABNORMAL HIGH (ref 0.26–1.65)
Lambda free light chains: 11.3 mg/L (ref 5.7–26.3)

## 2019-02-21 LAB — PROTEIN ELECTROPHORESIS, SERUM
A/G Ratio: 1.3 (ref 0.7–1.7)
Albumin ELP: 3.6 g/dL (ref 2.9–4.4)
Alpha-1-Globulin: 0.2 g/dL (ref 0.0–0.4)
Alpha-2-Globulin: 0.5 g/dL (ref 0.4–1.0)
Beta Globulin: 0.8 g/dL (ref 0.7–1.3)
Gamma Globulin: 1.3 g/dL (ref 0.4–1.8)
Globulin, Total: 2.7 g/dL (ref 2.2–3.9)
M-Spike, %: 0.8 g/dL — ABNORMAL HIGH
Total Protein ELP: 6.3 g/dL (ref 6.0–8.5)

## 2019-02-26 ENCOUNTER — Other Ambulatory Visit: Payer: Self-pay

## 2019-02-26 ENCOUNTER — Inpatient Hospital Stay: Payer: Medicare HMO | Attending: Oncology | Admitting: Oncology

## 2019-02-26 ENCOUNTER — Encounter: Payer: Self-pay | Admitting: Oncology

## 2019-02-26 VITALS — BP 129/83 | HR 70 | Temp 97.8°F | Resp 18 | Wt 210.3 lb

## 2019-02-26 DIAGNOSIS — D472 Monoclonal gammopathy: Secondary | ICD-10-CM | POA: Diagnosis not present

## 2019-02-26 DIAGNOSIS — Z7982 Long term (current) use of aspirin: Secondary | ICD-10-CM | POA: Insufficient documentation

## 2019-02-26 DIAGNOSIS — Z87891 Personal history of nicotine dependence: Secondary | ICD-10-CM | POA: Diagnosis not present

## 2019-02-26 DIAGNOSIS — Z79899 Other long term (current) drug therapy: Secondary | ICD-10-CM | POA: Diagnosis not present

## 2019-02-26 NOTE — Progress Notes (Signed)
Patient here for follow up. States he feels "good." No concerns voiced.

## 2019-02-26 NOTE — Progress Notes (Signed)
[facility]  [phone]) [phone] [phone]) [phone]  ID: Daniel Carr OB: 1935/02/06  MR#: [id number]  PYP#:[id number]  Patient Care Team: [name], MD as PCP - General (Family Medicine) Dingeldein, Remo Lipps, MD as Consulting Physician (Ophthalmology)  CHIEF COMPLAINT: MGUS  INTERVAL HISTORY: Patient returns to clinic today for repeat laboratory and further evaluation.  He continues to feel well and remains asymptomatic. He has no neurologic complaints.  He denies any recent fevers or illnesses.  He has a good appetite and denies weight loss.  He has no chest pain or shortness of breath.  He denies any nausea, vomiting, constipation, or diarrhea.  He has no urinary complaints.  Patient offers no specific complaints today.  REVIEW OF SYSTEMS:   Review of Systems  Constitutional: Negative.  Negative for fever, malaise/fatigue and weight loss.  Respiratory: Negative.  Negative for cough and shortness of breath.   Cardiovascular: Negative.  Negative for chest pain and leg swelling.  Gastrointestinal: Negative.  Negative for abdominal pain.  Genitourinary: Negative.  Negative for dysuria.  Musculoskeletal: Positive for joint pain.  Skin: Negative.  Negative for rash.  Neurological: Negative.  Negative for focal weakness, weakness and headaches.  Psychiatric/Behavioral: Negative.  The patient is not nervous/anxious.     As per HPI. Otherwise, a complete review of systems is negative.  PAST MEDICAL HISTORY: Past Medical History:  Diagnosis Date  . childhood infection    chicken pox, measels, and mumps  . Sinus problem   . Ulnar neuropathy     PAST SURGICAL HISTORY: Past Surgical History:  Procedure Laterality Date  . APPENDECTOMY    . CATARACT EXTRACTION Bilateral   . KNEE SURGERY  10/04/2011  . NM MYOVIEW ([place] HX)  07/21/2017   [facility]. No ischemia.   [name] RETINAL DETACHMENT REPAIR W/ SCLERAL BUCKLE LE      FAMILY HISTORY: Family History  Problem  Relation Age of Onset  . Stroke Sister   . Hypertension Mother   . Stroke Father   . Hypertension Father     ADVANCED DIRECTIVES (Y/N):  N  HEALTH MAINTENANCE: Social History   Tobacco Use  . Smoking status: Former Smoker    Packs/day: 0.25    Years: 6.00    Pack years: 1.50    Last attempt to quit: 11/13/1969    Years since quitting: 49.3  . Smokeless tobacco: Never Used  . Tobacco comment: quit in 1970's  Substance Use Topics  . Alcohol use: No    Alcohol/week: 0.0 standard drinks  . Drug use: No     Colonoscopy:  PAP:  Bone density:  Lipid panel:  No Known Allergies  Current Outpatient Medications  Medication Sig Dispense Refill  . amLODipine (NORVASC) 2.5 MG tablet Take 1 tablet (2.5 mg total) by mouth daily. 30 tablet 3  . aspirin 81 MG tablet Take 81 mg by mouth daily.    . brimonidine (ALPHAGAN) 0.2 % ophthalmic solution     . dutasteride (AVODART) 0.5 MG capsule TAKE 1 CAPSULE BY MOUTH ONCE DAILY 30 capsule 11  . hydrochlorothiazide (HYDRODIURIL) 25 MG tablet Take 0.5 tablets (12.5 mg total) by mouth daily. 30 tablet 12  . ipratropium (ATROVENT) 0.06 % nasal spray   6  . lovastatin (MEVACOR) 40 MG tablet TAKE 1 TABLET BY MOUTH ONCE DAILY 90 tablet 4  . montelukast (SINGULAIR) 10 MG tablet Take 1 tablet (10 mg total) by mouth at bedtime. 30 tablet 11  . Multiple Vitamin (DAILY VITAMIN PO) Take  by mouth.    . SYNTHROID 125 MCG tablet TAKE 1 TABLET BY MOUTH ONCE DAILY 30 tablet 12  . Tiotropium Bromide-Olodaterol (STIOLTO RESPIMAT) 2.5-2.5 MCG/ACT AERS Inhale 2 puffs into the lungs daily. 1 Inhaler 1  . TRAVATAN Z 0.004 % SOLN ophthalmic solution      No current facility-administered medications for this visit.     OBJECTIVE: Vitals:   02/26/19 1057  BP: 129/83  Pulse: 70  Resp: 18  Temp: 97.8 F (36.6 C)     Body mass index is 28.52 kg/m.    ECOG FS:0 - Asymptomatic  General: Well-developed, well-nourished, no acute distress. Eyes: Pink  conjunctiva, anicteric sclera. HEENT: Normocephalic, moist mucous membranes. Lungs: Clear to auscultation bilaterally. Heart: Regular rate and rhythm. No rubs, murmurs, or gallops. Abdomen: Soft, nontender, nondistended. No organomegaly noted, normoactive bowel sounds. Musculoskeletal: No edema, cyanosis, or clubbing. Neuro: Alert, answering all questions appropriately. Cranial nerves grossly intact. Skin: No rashes or petechiae noted. Psych: Normal affect.  LAB RESULTS:  Lab Results  Component Value Date   NA 136 02/19/2019   K 3.5 02/19/2019   CL 100 02/19/2019   CO2 31 02/19/2019   GLUCOSE 92 02/19/2019   BUN 18 02/19/2019   CREATININE 1.12 02/19/2019   CALCIUM 9.2 02/19/2019   PROT 7.1 02/19/2019   ALBUMIN 4.1 02/19/2019   AST 24 02/19/2019   ALT 18 02/19/2019   ALKPHOS 40 02/19/2019   BILITOT 0.7 02/19/2019   GFRNONAA >60 02/19/2019   GFRAA >60 02/19/2019    Lab Results  Component Value Date   WBC 6.1 02/19/2019   NEUTROABS 3.3 02/19/2019   HGB 12.9 (L) 02/19/2019   HCT 39.8 02/19/2019   MCV 93.4 02/19/2019   PLT 189 02/19/2019   Lab Results  Component Value Date   TOTALPROTELP 6.3 02/19/2019   ALBUMINELP 3.6 02/19/2019   A1GS 0.2 02/19/2019   A2GS 0.5 02/19/2019   BETS 0.8 02/19/2019   GAMS 1.3 02/19/2019   MSPIKE 0.8 (H) 02/19/2019   SPEI Comment 02/19/2019     STUDIES: Dg Chest 2 View  Result Date: 01/31/2019 CLINICAL DATA:  Cough for the past month. EXAM: CHEST - 2 VIEW COMPARISON:  01/27/2015. FINDINGS: Stable marked elevation of the left hemidiaphragm. The right lung remains hyperexpanded with stable mild diffuse peribronchial thickening and accentuation of the interstitial markings. Normal sized heart. Thoracic spine degenerative changes. IMPRESSION: No acute abnormality. Stable marked elevation of the left hemidiaphragm and changes of COPD and chronic bronchitis. Electronically Signed   By: Claudie Revering M.D.   On: 01/31/2019 14:15    ASSESSMENT:  MGUS  PLAN:    1. MGUS: Patient's most recent M spike is 0.8.  His immunoglobulins are all within normal limits.  He has a mildly elevated kappa free light chain and kappa/lambda light chain ratio.  He has no evidence of endorgan damage.  There is no need to do a bone marrow biopsy or metastatic bone survey at this time. Return to clinic in 6 months with repeat laboratory work and further evaluation.  If patient's laboratory work remains stable, he could possibly be transitioned to yearly visits.  I spent a total of 30 minutes face-to-face with the patient of which greater than 50% of the visit was spent in counseling and coordination of care as detailed above.  Patient expressed understanding and was in agreement with this plan. He also understands that He can call clinic at any time with any questions, concerns, or complaints.  Lloyd Huger, MD   02/27/2019 4:43 PM

## 2019-04-24 DIAGNOSIS — H16221 Keratoconjunctivitis sicca, not specified as Sjogren's, right eye: Secondary | ICD-10-CM | POA: Diagnosis not present

## 2019-04-25 ENCOUNTER — Other Ambulatory Visit: Payer: Self-pay | Admitting: Family Medicine

## 2019-04-25 DIAGNOSIS — I10 Essential (primary) hypertension: Secondary | ICD-10-CM

## 2019-05-08 DIAGNOSIS — H16221 Keratoconjunctivitis sicca, not specified as Sjogren's, right eye: Secondary | ICD-10-CM | POA: Diagnosis not present

## 2019-06-05 DIAGNOSIS — H16221 Keratoconjunctivitis sicca, not specified as Sjogren's, right eye: Secondary | ICD-10-CM | POA: Diagnosis not present

## 2019-06-11 ENCOUNTER — Encounter: Payer: Self-pay | Admitting: Family Medicine

## 2019-06-11 ENCOUNTER — Ambulatory Visit (INDEPENDENT_AMBULATORY_CARE_PROVIDER_SITE_OTHER): Payer: Medicare HMO | Admitting: Family Medicine

## 2019-06-11 ENCOUNTER — Other Ambulatory Visit: Payer: Self-pay

## 2019-06-11 VITALS — BP 108/66 | HR 80 | Temp 98.2°F | Ht 73.0 in | Wt 209.0 lb

## 2019-06-11 DIAGNOSIS — E7800 Pure hypercholesterolemia, unspecified: Secondary | ICD-10-CM

## 2019-06-11 DIAGNOSIS — E039 Hypothyroidism, unspecified: Secondary | ICD-10-CM

## 2019-06-11 DIAGNOSIS — I10 Essential (primary) hypertension: Secondary | ICD-10-CM | POA: Diagnosis not present

## 2019-06-11 DIAGNOSIS — Z0000 Encounter for general adult medical examination without abnormal findings: Secondary | ICD-10-CM | POA: Diagnosis not present

## 2019-06-11 DIAGNOSIS — Z125 Encounter for screening for malignant neoplasm of prostate: Secondary | ICD-10-CM

## 2019-06-11 NOTE — Progress Notes (Signed)
Patient: Daniel Carr, Male    DOB: Jan 07, 1935, 84 y.o.   MRN: [id number] Visit Date: 06/20/2019  Today's Provider: Lelon Huh, MD   Chief Complaint  Patient presents with  . Annual Exam   Subjective:     Annual wellness visit Daniel Carr is a 84 y.o. male. He feels fairly well. He reports exercising occasionally. He reports he is sleeping fairly well.  -----------------------------------------------------------  Hypertension, follow-up:  BP Readings from Last 3 Encounters:  06/11/19 108/66  02/26/19 129/83  02/19/19 122/70    He reports good compliance with treatment. He is not having side effects.  He is exercising. He is adherent to low salt diet.   Outside blood pressures are normal.  Patient denies chest pain, chest pressure/discomfort, irregular heart beat, lower extremity edema, orthopnea, palpitations, paroxysmal nocturnal dyspnea and syncope.       Weight trend: stable Wt Readings from Last 3 Encounters:  06/11/19 209 lb (94.8 kg)  02/26/19 210 lb 4.8 oz (95.4 kg)  02/19/19 207 lb (93.9 kg)    ------------------------------------------------------------------------   Lipid/Cholesterol, Follow-up:   *. . Last Lipid Panel:    Component Value Date/Time   CHOL 156 06/11/2019 1025   TRIG 76 06/11/2019 1025   HDL 65 06/11/2019 1025   CHOLHDL 2.4 06/11/2019 1025   LDLCALC 76 06/11/2019 1025     He reports good compliance with treatment. He is not having side effects.    Wt Readings from Last 3 Encounters:  06/11/19 209 lb (94.8 kg)  02/26/19 210 lb 4.8 oz (95.4 kg)  02/19/19 207 lb (93.9 kg)    -------------------------------------------------------------------   Review of Systems  Constitutional: Negative for appetite change, chills and fever.  Respiratory: Negative for chest tightness, shortness of breath and wheezing.   Cardiovascular: Negative for chest pain and palpitations.  Gastrointestinal: Negative for abdominal  pain, nausea and vomiting.    Social History   Socioeconomic History  . Marital status: Married    Spouse name: Not on file  . Number of children: 1  . Years of education: college  . Highest education level: Not on file  Occupational History  . Occupation: Retired  Scientific laboratory technician  . Financial resource strain: Not on file  . Food insecurity    Worry: Not on file    Inability: Not on file  . Transportation needs    Medical: Not on file    Non-medical: Not on file  Tobacco Use  . Smoking status: Former Smoker    Packs/day: 0.25    Years: 6.00    Pack years: 1.50    Quit date: 11/13/1969    Years since quitting: 49.6  . Smokeless tobacco: Never Used  . Tobacco comment: quit in 1970's  Substance and Sexual Activity  . Alcohol use: No    Alcohol/week: 0.0 standard drinks  . Drug use: No  . Sexual activity: Yes  Lifestyle  . Physical activity    Days per week: Not on file    Minutes per session: Not on file  . Stress: Not on file  Relationships  . Social Herbalist on phone: Not on file    Gets together: Not on file    Attends religious service: Not on file    Active member of club or organization: Not on file    Attends meetings of clubs or organizations: Not on file    Relationship status: Not on file  . Intimate partner violence  Fear of current or ex partner: Not on file    Emotionally abused: Not on file    Physically abused: Not on file    Forced sexual activity: Not on file  Other Topics Concern  . Not on file  Social History Narrative  . Not on file    Past Medical History:  Diagnosis Date  . childhood infection    chicken pox, measels, and mumps  . Sinus problem   . Ulnar neuropathy      Patient Active Problem List   Diagnosis Date Noted  . Allergic rhinitis 02/19/2019  . Chronic bronchitis ([place]) 01/31/2019  . MGUS (monoclonal gammopathy of unknown significance) 11/11/2018  . Systolic dysfunction 73/41/9379  . Primary osteoarthritis  of left knee 07/28/2017  . Callus of foot 07/27/2016  . Nail dystrophy 07/27/2016  . Arthropathia 06/09/2015  . Neuropathic ulnar nerve 06/09/2015  . Glaucoma   . Hypothyroid   . Pure hypercholesterolemia   . Hypertension, benign   . Arthritis, multiple joint involvement   . BPH (benign prostatic hyperplasia)   . ED (erectile dysfunction)   . Nocturia associated with benign prostatic hypertrophy     Past Surgical History:  Procedure Laterality Date  . APPENDECTOMY    . CATARACT EXTRACTION Bilateral   . KNEE SURGERY  10/04/2011  . NM MYOVIEW ([place] [facility]X)  07/21/2017   [facility]. No ischemia.   [name] RETINAL DETACHMENT REPAIR W/ SCLERAL BUCKLE LE      His family history includes Hypertension in his father and mother; Stroke in his father and sister.   Current Outpatient Medications:  .  amLODipine (NORVASC) 2.5 MG tablet, Take 1 tablet by mouth once daily, Disp: 90 tablet, Rfl: 1 .  aspirin 81 MG tablet, Take 81 mg by mouth daily., Disp: , Rfl:  .  brimonidine (ALPHAGAN) 0.2 % ophthalmic solution, , Disp: , Rfl:  .  dutasteride (AVODART) 0.5 MG capsule, TAKE 1 CAPSULE BY MOUTH ONCE DAILY, Disp: 30 capsule, Rfl: 11 .  hydrochlorothiazide (HYDRODIURIL) 25 MG tablet, Take 0.5 tablets (12.5 mg total) by mouth daily., Disp: 30 tablet, Rfl: 12 .  ipratropium (ATROVENT) 0.06 % nasal spray, , Disp: , Rfl: 6 .  lovastatin (MEVACOR) 40 MG tablet, TAKE 1 TABLET BY MOUTH ONCE DAILY, Disp: 90 tablet, Rfl: 4 .  montelukast (SINGULAIR) 10 MG tablet, Take 1 tablet (10 mg total) by mouth at bedtime., Disp: 30 tablet, Rfl: 11 .  Multiple Vitamin (DAILY VITAMIN PO), Take by mouth., Disp: , Rfl:  .  SYNTHROID 125 MCG tablet, TAKE 1 TABLET BY MOUTH ONCE DAILY, Disp: 30 tablet, Rfl: 12 .  TRAVATAN Z 0.004 % SOLN ophthalmic solution, , Disp: , Rfl:   Patient Care Team: [name], MD as PCP - General (Family Medicine) Dingeldein, Remo Lipps, MD as Consulting Physician (Ophthalmology)    Objective:     Vitals: BP 108/66 (BP Location: Right Arm, Patient Position: Sitting, Cuff Size: Large)   Pulse 80   Temp 98.2 F (36.8 C) (Oral)   Ht 6\' 1"  (1.854 m)   Wt 209 lb (94.8 kg)   SpO2 98%   BMI 27.57 kg/m   Physical Exam   General Appearance:    Alert, cooperative, no distress  Eyes:    PERRL, conjunctiva/corneas clear, EOM's intact       Lungs:     Clear to auscultation bilaterally, respirations unlabored  Heart:    Regular rate and rhythm  Neurologic:   Awake, alert, oriented x 3.  No apparent focal neurological           defect.        Activities of Daily Living In your present state of health, do you have any difficulty performing the following activities: 06/11/2019  Hearing? N  Vision? N  Difficulty concentrating or making decisions? N  Walking or climbing stairs? Y  Dressing or bathing? N  Doing errands, shopping? N  Some recent data might be hidden    Fall Risk Assessment Fall Risk  06/11/2019 06/09/2018 06/08/2017 06/08/2016 06/06/2015  Falls in the past year? 0 No No No No     Depression Screen PHQ 2/9 Scores 06/11/2019 06/09/2018 06/08/2017 06/08/2017  PHQ - 2 Score 0 0 0 0  PHQ- 9 Score 1 2 2  -    6CIT Screen 06/08/2017  What Year? 0 points  What month? 0 points  What time? 0 points  Count back from 20 0 points  Months in reverse 0 points  Repeat phrase 0 points  Total Score 0      Assessment & Plan:     Annual Wellness Visit  Reviewed patient's Family Medical History Reviewed and updated list of patient's medical providers Assessment of cognitive impairment was done Assessed patient's functional ability Established a written schedule for health screening [place] Completed and Reviewed  Exercise Activities and Dietary recommendations Goals    . Increase water intake     Recommend increasing water intake to 4-6 glasses a day.       Immunization History  Administered Date(s) Administered  . Influenza, High Dose Seasonal PF  10/07/2015, 10/16/2016, 09/02/2017, 09/19/2018  . Influenza-Unspecified 09/19/2014  . Pneumococcal Conjugate-13 06/03/2014  . Pneumococcal Polysaccharide-23 04/16/2003  . Td 03/04/1997  . Tdap 11/09/2011  . Zoster 05/09/2008, 06/03/2014    Health Maintenance  Topic Date Due  . INFLUENZA VACCINE  07/28/2019  . TETANUS/TDAP  11/08/2021  . PNA vac Low Risk Adult  Completed     Discussed health benefits of physical activity, and encouraged him to engage in regular exercise appropriate for his age and condition.    ------------------------------------------------------------------------------------------------------------  1. Medicare annual wellness visit, subsequent Generally doing well.   2. Hypertension, benign Well controlled.  Continue current medications.    3. Hypothyroidism, unspecified type -TSH . 4. Pure hypercholesterolemia He is tolerating lovastatin well with no adverse effects.   - Lipid panel - TSH  5. Prostate cancer screening  - PSA   Lelon Huh, MD  [facility]

## 2019-06-12 ENCOUNTER — Telehealth: Payer: Self-pay

## 2019-06-12 LAB — LIPID PANEL
Chol/HDL Ratio: 2.4 ratio (ref 0.0–5.0)
Cholesterol, Total: 156 mg/dL (ref 100–199)
HDL: 65 mg/dL (ref >39)
LDL Calculated: 76 mg/dL (ref 0–99)
Triglycerides: 76 mg/dL (ref 0–149)
VLDL Cholesterol Cal: 15 mg/dL (ref 5–40)

## 2019-06-12 LAB — PSA: Prostate Specific Ag, Serum: 3.5 ng/mL (ref 0.0–4.0)

## 2019-06-12 LAB — TSH: TSH: 0.807 u[IU]/mL (ref 0.450–4.500)

## 2019-06-12 NOTE — Telephone Encounter (Signed)
Pt advised.   Thanks,   -Laura  

## 2019-06-12 NOTE — Telephone Encounter (Signed)
-----   Message from [name], MD sent at 06/12/2019  8:57 AM EDT ----- Labs including cholesterol and thyroid are very good. Normal psa. Check yearly.

## 2019-06-20 NOTE — Patient Instructions (Signed)
.   Please review the attached list of medications and notify my office if there are any errors.   . Please bring all of your medications to every appointment so we can make sure that our medication list is the same as yours.   

## 2019-07-02 ENCOUNTER — Other Ambulatory Visit: Payer: Self-pay | Admitting: Family Medicine

## 2019-07-02 MED ORDER — SILDENAFIL CITRATE 100 MG PO TABS: 50.0000 mg | ORAL_TABLET | Freq: Every day | ORAL | 5 refills | Status: DC | PRN

## 2019-07-30 ENCOUNTER — Telehealth: Payer: Self-pay

## 2019-07-30 DIAGNOSIS — G629 Polyneuropathy, unspecified: Secondary | ICD-10-CM

## 2019-07-30 NOTE — Telephone Encounter (Signed)
Bilateral feet numbness times several months. Patient requesting referral to neurology. Please advise.

## 2019-08-01 ENCOUNTER — Other Ambulatory Visit: Payer: Self-pay | Admitting: Family Medicine

## 2019-08-28 ENCOUNTER — Other Ambulatory Visit: Payer: Self-pay

## 2019-08-29 ENCOUNTER — Other Ambulatory Visit: Payer: Self-pay

## 2019-08-29 ENCOUNTER — Inpatient Hospital Stay: Payer: Medicare HMO | Attending: Oncology

## 2019-08-29 DIAGNOSIS — Z87891 Personal history of nicotine dependence: Secondary | ICD-10-CM | POA: Diagnosis not present

## 2019-08-29 DIAGNOSIS — R200 Anesthesia of skin: Secondary | ICD-10-CM | POA: Diagnosis not present

## 2019-08-29 DIAGNOSIS — Z79899 Other long term (current) drug therapy: Secondary | ICD-10-CM | POA: Insufficient documentation

## 2019-08-29 DIAGNOSIS — E538 Deficiency of other specified B group vitamins: Secondary | ICD-10-CM | POA: Diagnosis not present

## 2019-08-29 DIAGNOSIS — D472 Monoclonal gammopathy: Secondary | ICD-10-CM

## 2019-08-29 DIAGNOSIS — R202 Paresthesia of skin: Secondary | ICD-10-CM | POA: Diagnosis not present

## 2019-08-29 DIAGNOSIS — E559 Vitamin D deficiency, unspecified: Secondary | ICD-10-CM | POA: Diagnosis not present

## 2019-08-29 DIAGNOSIS — Z7982 Long term (current) use of aspirin: Secondary | ICD-10-CM | POA: Insufficient documentation

## 2019-08-29 DIAGNOSIS — R2689 Other abnormalities of gait and mobility: Secondary | ICD-10-CM | POA: Diagnosis not present

## 2019-08-29 LAB — COMPREHENSIVE METABOLIC PANEL
ALT: 13 U/L (ref 0–44)
AST: 20 U/L (ref 15–41)
Albumin: 4 g/dL (ref 3.5–5.0)
Alkaline Phosphatase: 32 U/L — ABNORMAL LOW (ref 38–126)
Anion gap: 5 (ref 5–15)
BUN: 19 mg/dL (ref 8–23)
CO2: 32 mmol/L (ref 22–32)
Calcium: 9.4 mg/dL (ref 8.9–10.3)
Chloride: 98 mmol/L (ref 98–111)
Creatinine, Ser: 1.04 mg/dL (ref 0.61–1.24)
GFR calc Af Amer: >60 mL/min (ref >60)
GFR calc non Af Amer: >60 mL/min (ref >60)
Glucose, Bld: 106 mg/dL — ABNORMAL HIGH (ref 70–99)
Potassium: 3.7 mmol/L (ref 3.5–5.1)
Sodium: 135 mmol/L (ref 135–145)
Total Bilirubin: 0.8 mg/dL (ref 0.3–1.2)
Total Protein: 7.1 g/dL (ref 6.5–8.1)

## 2019-08-29 LAB — CBC WITH DIFFERENTIAL/PLATELET
Abs Immature Granulocytes: 0.01 10*3/uL (ref 0.00–0.07)
Basophils Absolute: 0 10*3/uL (ref 0.0–0.1)
Basophils Relative: 1 %
Eosinophils Absolute: 0.1 10*3/uL (ref 0.0–0.5)
Eosinophils Relative: 1 %
HCT: 38.9 % — ABNORMAL LOW (ref 39.0–52.0)
Hemoglobin: 12.6 g/dL — ABNORMAL LOW (ref 13.0–17.0)
Immature Granulocytes: 0 %
Lymphocytes Relative: 39 %
Lymphs Abs: 2.7 10*3/uL (ref 0.7–4.0)
MCH: 29.9 pg (ref 26.0–34.0)
MCHC: 32.4 g/dL (ref 30.0–36.0)
MCV: 92.4 fL (ref 80.0–100.0)
Monocytes Absolute: 0.7 10*3/uL (ref 0.1–1.0)
Monocytes Relative: 10 %
Neutro Abs: 3.4 10*3/uL (ref 1.7–7.7)
Neutrophils Relative %: 49 %
Platelets: 181 10*3/uL (ref 150–400)
RBC: 4.21 MIL/uL — ABNORMAL LOW (ref 4.22–5.81)
RDW: 12.7 % (ref 11.5–15.5)
WBC: 6.9 10*3/uL (ref 4.0–10.5)
nRBC: 0 % (ref 0.0–0.2)

## 2019-08-30 LAB — PROTEIN ELECTROPHORESIS, SERUM
A/G Ratio: 1.2 (ref 0.7–1.7)
Albumin ELP: 3.5 g/dL (ref 2.9–4.4)
Alpha-1-Globulin: 0.2 g/dL (ref 0.0–0.4)
Alpha-2-Globulin: 0.5 g/dL (ref 0.4–1.0)
Beta Globulin: 0.8 g/dL (ref 0.7–1.3)
Gamma Globulin: 1.4 g/dL (ref 0.4–1.8)
Globulin, Total: 2.9 g/dL (ref 2.2–3.9)
M-Spike, %: 0.7 g/dL — ABNORMAL HIGH
Total Protein ELP: 6.4 g/dL (ref 6.0–8.5)

## 2019-08-30 LAB — IGG, IGA, IGM
IgA: 168 mg/dL (ref 61–437)
IgG (Immunoglobin G), Serum: 1431 mg/dL (ref 603–1613)
IgM (Immunoglobulin M), Srm: 69 mg/dL (ref 15–143)

## 2019-08-30 LAB — KAPPA/LAMBDA LIGHT CHAINS
Kappa free light chain: 23 mg/L — ABNORMAL HIGH (ref 3.3–19.4)
Kappa, lambda light chain ratio: 1.76 — ABNORMAL HIGH (ref 0.26–1.65)
Lambda free light chains: 13.1 mg/L (ref 5.7–26.3)

## 2019-08-31 NOTE — Progress Notes (Signed)
[facility]  Telephone:(336) [phone] Fax:(336) [phone]  ID: Daniel Carr OB: 03-Jul-1935  MR#: [id number]  BHA#:[id number]  Patient Care Team: [name], MD as PCP - General (Family Medicine) Dingeldein, Remo Lipps, MD as Consulting Physician (Ophthalmology)  CHIEF COMPLAINT: MGUS  INTERVAL HISTORY: Patient returns to clinic today for repeat laboratory can routine 48-monthevaluation.  He continues to feel well and remains asymptomatic. He has no neurologic complaints.  He denies any recent fevers or illnesses.  He has a good appetite and denies weight loss.  He denies any chest pain, shortness of breath, cough, or hemoptysis.  He denies any nausea, vomiting, constipation, or diarrhea.  He has no urinary complaints.  Patient feels at his baseline offers no specific complaints today.  REVIEW OF SYSTEMS:   Review of Systems  Constitutional: Negative.  Negative for fever, malaise/fatigue and weight loss.  Respiratory: Negative.  Negative for cough, hemoptysis and shortness of breath.   Cardiovascular: Negative.  Negative for chest pain and leg swelling.  Gastrointestinal: Negative.  Negative for abdominal pain.  Genitourinary: Negative.  Negative for dysuria.  Musculoskeletal: Negative.  Negative for joint pain.  Skin: Negative.  Negative for rash.  Neurological: Negative.  Negative for dizziness, focal weakness, weakness and headaches.  Psychiatric/Behavioral: Negative.  The patient is not nervous/anxious.     As per HPI. Otherwise, a complete review of systems is negative.  PAST MEDICAL HISTORY: Past Medical History:  Diagnosis Date  . childhood infection    chicken pox, measels, and mumps  . Sinus problem   . Ulnar neuropathy     PAST SURGICAL HISTORY: Past Surgical History:  Procedure Laterality Date  . APPENDECTOMY    . CATARACT EXTRACTION Bilateral   . KNEE SURGERY  10/04/2011  . NM MYOVIEW ([facility])  07/21/2017   [facility]. No ischemia.   .Marland KitchenRETINAL  DETACHMENT REPAIR W/ SCLERAL BUCKLE LE      FAMILY HISTORY: Family History  Problem Relation Age of Onset  . Stroke Sister   . Hypertension Mother   . Stroke Father   . Hypertension Father     ADVANCED DIRECTIVES (Y/N):  N  HEALTH MAINTENANCE: Social History   Tobacco Use  . Smoking status: Former Smoker    Packs/day: 0.25    Years: 6.00    Pack years: 1.50    Quit date: 11/13/1969    Years since quitting: 49.8  . Smokeless tobacco: Never Used  . Tobacco comment: quit in 1970's  Substance Use Topics  . Alcohol use: No    Alcohol/week: 0.0 standard drinks  . Drug use: No     Colonoscopy:  PAP:  Bone density:  Lipid panel:  No Known Allergies  Current Outpatient Medications  Medication Sig Dispense Refill  . amLODipine (NORVASC) 2.5 MG tablet Take 1 tablet by mouth once daily 90 tablet 1  . aspirin 81 MG tablet Take 81 mg by mouth daily.    . brimonidine (ALPHAGAN) 0.2 % ophthalmic solution     . dutasteride (AVODART) 0.5 MG capsule TAKE 1 CAPSULE BY MOUTH ONCE DAILY 30 capsule 11  . hydrochlorothiazide (HYDRODIURIL) 25 MG tablet Take 0.5 tablets (12.5 mg total) by mouth daily. 30 tablet 12  . ipratropium (ATROVENT) 0.06 % nasal spray   6  . Lipoic Acid 150 MG CAPS Take by mouth.    . lovastatin (MEVACOR) 40 MG tablet Take 1 tablet by mouth once daily 90 tablet 0  . montelukast (SINGULAIR) 10 MG tablet Take 1  tablet (10 mg total) by mouth at bedtime. 30 tablet 11  . Multiple Vitamin (DAILY VITAMIN PO) Take by mouth.    . sildenafil (VIAGRA) 100 MG tablet Take 0.5-1 tablets (50-100 mg total) by mouth daily as needed for erectile dysfunction. 10 tablet 5  . SYNTHROID 125 MCG tablet TAKE 1 TABLET BY MOUTH ONCE DAILY 30 tablet 12  . TRAVATAN Z 0.004 % SOLN ophthalmic solution      No current facility-administered medications for this visit.     OBJECTIVE: Vitals:   09/06/19 1031  BP: (!) 143/86  Pulse: 81  Resp: 18  Temp: 98.7 F (37.1 C)     Body mass  index is 27.44 kg/m.    ECOG FS:0 - Asymptomatic  General: Well-developed, well-nourished, no acute distress. Eyes: Pink conjunctiva, anicteric sclera. HEENT: Normocephalic, moist mucous membranes. Lungs: Clear to auscultation bilaterally. Heart: Regular rate and rhythm. No rubs, murmurs, or gallops. Abdomen: Soft, nontender, nondistended. No organomegaly noted, normoactive bowel sounds. Musculoskeletal: No edema, cyanosis, or clubbing. Neuro: Alert, answering all questions appropriately. Cranial nerves grossly intact. Skin: No rashes or petechiae noted. Psych: Normal affect.  LAB RESULTS:  Lab Results  Component Value Date   NA 135 08/29/2019   K 3.7 08/29/2019   CL 98 08/29/2019   CO2 32 08/29/2019   GLUCOSE 106 (H) 08/29/2019   BUN 19 08/29/2019   CREATININE 1.04 08/29/2019   CALCIUM 9.4 08/29/2019   PROT 7.1 08/29/2019   ALBUMIN 4.0 08/29/2019   AST 20 08/29/2019   ALT 13 08/29/2019   ALKPHOS 32 (L) 08/29/2019   BILITOT 0.8 08/29/2019   GFRNONAA >60 08/29/2019   GFRAA >60 08/29/2019    Lab Results  Component Value Date   WBC 6.9 08/29/2019   NEUTROABS 3.4 08/29/2019   HGB 12.6 (L) 08/29/2019   HCT 38.9 (L) 08/29/2019   MCV 92.4 08/29/2019   PLT 181 08/29/2019   Lab Results  Component Value Date   TOTALPROTELP 6.4 08/29/2019   ALBUMINELP 3.5 08/29/2019   A1GS 0.2 08/29/2019   A2GS 0.5 08/29/2019   BETS 0.8 08/29/2019   GAMS 1.4 08/29/2019   MSPIKE 0.7 (H) 08/29/2019   SPEI Comment 08/29/2019     STUDIES: No results found.  ASSESSMENT: MGUS  PLAN:    1. MGUS: Patient's most recent M spike is 0.7 which is essentially unchanged from previous.  His immunoglobulins continue to be within normal limits.  He has a mildly elevated kappa free light chain and kappa/lambda light chain ratio that is improved over the last 6 months.  He has no evidence of endorgan damage.  There is no need to pursue a bone marrow biopsy or metastatic bone survey at this time.   Patient is at low risk for progressing to myeloma.  Return to clinic in 1 year with repeat laboratory work and further evaluation.    I spent a total of 20 minutes face-to-face with the patient of which greater than 50% of the visit was spent in counseling and coordination of care as detailed above.  Patient expressed understanding and was in agreement with this plan. He also understands that He can call clinic at any time with any questions, concerns, or complaints.    Lloyd Huger, MD   09/06/2019 7:07 PM

## 2019-09-06 ENCOUNTER — Encounter: Payer: Self-pay | Admitting: Oncology

## 2019-09-06 ENCOUNTER — Inpatient Hospital Stay: Payer: Medicare HMO | Admitting: Oncology

## 2019-09-06 ENCOUNTER — Other Ambulatory Visit: Payer: Self-pay

## 2019-09-06 VITALS — BP 143/86 | HR 81 | Temp 98.7°F | Resp 18 | Wt 208.0 lb

## 2019-09-06 DIAGNOSIS — D472 Monoclonal gammopathy: Secondary | ICD-10-CM | POA: Diagnosis not present

## 2019-09-06 DIAGNOSIS — Z79899 Other long term (current) drug therapy: Secondary | ICD-10-CM | POA: Diagnosis not present

## 2019-09-06 DIAGNOSIS — Z87891 Personal history of nicotine dependence: Secondary | ICD-10-CM | POA: Diagnosis not present

## 2019-09-06 DIAGNOSIS — Z7982 Long term (current) use of aspirin: Secondary | ICD-10-CM | POA: Diagnosis not present

## 2019-09-06 NOTE — Progress Notes (Signed)
Patient denies any concerns today.  

## 2019-10-03 ENCOUNTER — Other Ambulatory Visit: Payer: Self-pay | Admitting: Family Medicine

## 2019-10-03 DIAGNOSIS — I10 Essential (primary) hypertension: Secondary | ICD-10-CM

## 2019-10-11 DIAGNOSIS — H401132 Primary open-angle glaucoma, bilateral, moderate stage: Secondary | ICD-10-CM | POA: Diagnosis not present

## 2019-10-23 ENCOUNTER — Other Ambulatory Visit: Payer: Self-pay

## 2019-10-23 ENCOUNTER — Ambulatory Visit (INDEPENDENT_AMBULATORY_CARE_PROVIDER_SITE_OTHER): Payer: Medicare HMO | Admitting: Family Medicine

## 2019-10-23 DIAGNOSIS — Z23 Encounter for immunization: Secondary | ICD-10-CM | POA: Diagnosis not present

## 2019-10-31 ENCOUNTER — Other Ambulatory Visit: Payer: Self-pay | Admitting: Family Medicine

## 2019-11-08 DIAGNOSIS — J301 Allergic rhinitis due to pollen: Secondary | ICD-10-CM | POA: Diagnosis not present

## 2019-11-08 DIAGNOSIS — J3089 Other allergic rhinitis: Secondary | ICD-10-CM | POA: Diagnosis not present

## 2019-11-12 ENCOUNTER — Other Ambulatory Visit: Payer: Self-pay

## 2019-11-12 ENCOUNTER — Encounter: Payer: Self-pay | Admitting: Family Medicine

## 2019-11-12 ENCOUNTER — Ambulatory Visit (INDEPENDENT_AMBULATORY_CARE_PROVIDER_SITE_OTHER): Payer: Medicare HMO | Admitting: Family Medicine

## 2019-11-12 VITALS — BP 134/80 | HR 72 | Temp 96.8°F | Wt 211.6 lb

## 2019-11-12 DIAGNOSIS — S90421A Blister (nonthermal), right great toe, initial encounter: Secondary | ICD-10-CM | POA: Diagnosis not present

## 2019-11-12 MED ORDER — SILVER SULFADIAZINE 1 % EX CREA: 1.0000 "application " | TOPICAL_CREAM | Freq: Every day | CUTANEOUS | 0 refills | Status: DC

## 2019-11-12 NOTE — Progress Notes (Signed)
Patient: Daniel Carr Male    DOB: 17-Dec-1935   84 y.o.   MRN: [id number] Visit Date: 11/12/2019  Today's Provider: Lelon Huh, MD   Chief Complaint  Patient presents with  . Toe Pain   Subjective:     Toe Pain  Incident onset: couple weeks ago. There was no injury mechanism. Pain location: right great toe. Quality: blistered. The pain is moderate. The pain has been constant since onset. He reports no foreign bodies present. The symptoms are aggravated by weight bearing. He has tried nothing for the symptoms.  Has not been covering lesion.   No Known Allergies   Current Outpatient Medications:  .  amLODipine (NORVASC) 2.5 MG tablet, Take 1 tablet by mouth once daily, Disp: 90 tablet, Rfl: 4 .  aspirin 81 MG tablet, Take 81 mg by mouth daily., Disp: , Rfl:  .  Azelastine HCl 137 MCG/SPRAY SOLN, SMARTSIG:1-2 Spray(s) Both Nares Twice Daily, Disp: , Rfl:  .  brimonidine (ALPHAGAN) 0.2 % ophthalmic solution, , Disp: , Rfl:  .  dutasteride (AVODART) 0.5 MG capsule, Take 1 capsule by mouth once daily, Disp: 90 capsule, Rfl: 4 .  hydrochlorothiazide (HYDRODIURIL) 25 MG tablet, Take 0.5 tablets (12.5 mg total) by mouth daily., Disp: 30 tablet, Rfl: 12 .  ipratropium (ATROVENT) 0.06 % nasal spray, , Disp: , Rfl: 6 .  Lipoic Acid 150 MG CAPS, Take by mouth., Disp: , Rfl:  .  lovastatin (MEVACOR) 40 MG tablet, Take 1 tablet by mouth once daily, Disp: 90 tablet, Rfl: 4 .  montelukast (SINGULAIR) 10 MG tablet, Take 1 tablet (10 mg total) by mouth at bedtime., Disp: 30 tablet, Rfl: 11 .  Multiple Vitamin (DAILY VITAMIN PO), Take by mouth., Disp: , Rfl:  .  sildenafil (VIAGRA) 100 MG tablet, Take 0.5-1 tablets (50-100 mg total) by mouth daily as needed for erectile dysfunction., Disp: 10 tablet, Rfl: 5 .  SYNTHROID 125 MCG tablet, TAKE 1 TABLET BY MOUTH ONCE DAILY, Disp: 30 tablet, Rfl: 12 .  TRAVATAN Z 0.004 % SOLN ophthalmic solution, , Disp: , Rfl:   Review of Systems   Constitutional: Negative.   Respiratory: Negative.   Cardiovascular: Negative.   Musculoskeletal: Negative.        Right great toe pain     Social History   Tobacco Use  . Smoking status: Former Smoker    Packs/day: 0.25    Years: 6.00    Pack years: 1.50    Quit date: 11/13/1969    Years since quitting: 50.0  . Smokeless tobacco: Never Used  . Tobacco comment: quit in 1970's  Substance Use Topics  . Alcohol use: No    Alcohol/week: 0.0 standard drinks      Objective:   BP 134/80 (BP Location: Left Arm, Patient Position: Sitting, Cuff Size: Normal)   Pulse 72   Temp (!) 96.8 F (36 C) (Temporal)   Wt 211 lb 9.6 oz (96 kg)   BMI 27.92 kg/m  Vitals:   11/12/19 1342  BP: 134/80  Pulse: 72  Temp: (!) 96.8 F (36 C)  TempSrc: Temporal  Weight: 211 lb 9.6 oz (96 kg)  Body mass index is 27.92 kg/m.   Physical Exam   About 28mm shallow ulceration lateral aspect of right great toe which is severely deformited, no drainage, no swollen, no erythema.      Assessment & Plan     1. Blister (nonthermal), right great toe, initial encounter Is to  keep apply - silver sulfADIAZINE (SILVADENE) 1 % cream; Apply 1 application topically daily.  Dispense: 20 g; Refill: 0 daily and keep covered with adhesive bandage.   Call if symptoms change or if not rapidly improving.       The entirety of the information documented in the History of Present Illness, Review of Systems and Physical Exam were personally obtained by me. Portions of this information were initially documented by Idelle Jo, CMA and reviewed by me for thoroughness and accuracy.     Lelon Huh, MD  [facility]

## 2020-01-30 DIAGNOSIS — R200 Anesthesia of skin: Secondary | ICD-10-CM | POA: Diagnosis not present

## 2020-01-30 DIAGNOSIS — R202 Paresthesia of skin: Secondary | ICD-10-CM | POA: Diagnosis not present

## 2020-02-14 ENCOUNTER — Other Ambulatory Visit: Payer: Self-pay | Admitting: Family Medicine

## 2020-02-14 DIAGNOSIS — I10 Essential (primary) hypertension: Secondary | ICD-10-CM

## 2020-02-26 DIAGNOSIS — R202 Paresthesia of skin: Secondary | ICD-10-CM | POA: Diagnosis not present

## 2020-02-26 DIAGNOSIS — R200 Anesthesia of skin: Secondary | ICD-10-CM | POA: Diagnosis not present

## 2020-02-27 DIAGNOSIS — R200 Anesthesia of skin: Secondary | ICD-10-CM | POA: Diagnosis not present

## 2020-02-27 DIAGNOSIS — R2689 Other abnormalities of gait and mobility: Secondary | ICD-10-CM | POA: Diagnosis not present

## 2020-02-27 DIAGNOSIS — R202 Paresthesia of skin: Secondary | ICD-10-CM | POA: Diagnosis not present

## 2020-03-11 ENCOUNTER — Other Ambulatory Visit: Payer: Self-pay | Admitting: Family Medicine

## 2020-03-11 NOTE — Telephone Encounter (Signed)
Lab due 6/21- refilled per protocol

## 2020-04-09 DIAGNOSIS — H401132 Primary open-angle glaucoma, bilateral, moderate stage: Secondary | ICD-10-CM | POA: Diagnosis not present

## 2020-05-20 ENCOUNTER — Other Ambulatory Visit: Payer: Self-pay | Admitting: Family Medicine

## 2020-05-20 DIAGNOSIS — I10 Essential (primary) hypertension: Secondary | ICD-10-CM

## 2020-05-22 DIAGNOSIS — M1712 Unilateral primary osteoarthritis, left knee: Secondary | ICD-10-CM | POA: Diagnosis not present

## 2020-05-27 ENCOUNTER — Other Ambulatory Visit: Payer: Self-pay | Admitting: Family Medicine

## 2020-06-06 ENCOUNTER — Telehealth: Payer: Self-pay

## 2020-06-06 NOTE — Telephone Encounter (Signed)
Copied from [place] [id number]. Topic: Appointment Scheduling - Scheduling Inquiry for Clinic >> Jun 06, 2020 11:35 AM Percell Belt A wrote: Reason for CRM: pt called in and we sched him for the next available cpe for Dr Caryn Section which was the end of Aug.    He would like one sooner if possible.  His last cpe was 06/11/2019.  Please advise

## 2020-06-09 ENCOUNTER — Other Ambulatory Visit: Payer: Self-pay | Admitting: Family Medicine

## 2020-06-09 NOTE — Telephone Encounter (Signed)
Patient advised unfortunately there is not any CPE slots available before he is already scheduled.

## 2020-07-14 ENCOUNTER — Telehealth: Payer: Self-pay

## 2020-07-14 NOTE — Telephone Encounter (Signed)
Needs o.v. to evaluate. Can't really say if this is something a podiatrist would treat without seeing him.

## 2020-07-14 NOTE — Telephone Encounter (Signed)
Copied from [place] [id number]. Topic: General - Other >> Jul 14, 2020  8:23 AM Leward Quan A wrote: Reason for CRM: Patient daughter Lew Dawes called to say that patient ankles are swollen and painful asking if he need a referral to see the podiatrist and if so can Dr Caryn Section please send one in and contact her. She can be reached at Ph#   [phone]

## 2020-07-15 NOTE — Telephone Encounter (Signed)
Mrs. Daniel Carr advised as below. She will call back to schedule an appointment.

## 2020-07-22 ENCOUNTER — Other Ambulatory Visit: Payer: Self-pay

## 2020-07-22 ENCOUNTER — Ambulatory Visit (INDEPENDENT_AMBULATORY_CARE_PROVIDER_SITE_OTHER): Payer: Medicare HMO | Admitting: Family Medicine

## 2020-07-22 ENCOUNTER — Encounter: Payer: Self-pay | Admitting: Family Medicine

## 2020-07-22 VITALS — BP 108/60 | HR 82 | Temp 96.6°F | Wt 212.0 lb

## 2020-07-22 DIAGNOSIS — R011 Cardiac murmur, unspecified: Secondary | ICD-10-CM | POA: Diagnosis not present

## 2020-07-22 DIAGNOSIS — E7800 Pure hypercholesterolemia, unspecified: Secondary | ICD-10-CM | POA: Diagnosis not present

## 2020-07-22 DIAGNOSIS — R0600 Dyspnea, unspecified: Secondary | ICD-10-CM

## 2020-07-22 DIAGNOSIS — E039 Hypothyroidism, unspecified: Secondary | ICD-10-CM | POA: Diagnosis not present

## 2020-07-22 DIAGNOSIS — R600 Localized edema: Secondary | ICD-10-CM | POA: Diagnosis not present

## 2020-07-22 MED ORDER — FUROSEMIDE 20 MG PO TABS: 20.0000 mg | ORAL_TABLET | Freq: Every day | ORAL | 3 refills | Status: DC

## 2020-07-22 NOTE — Progress Notes (Signed)
Established patient visit   Patient: Daniel Carr   DOB: [birth year]   85 y.o. Male  MRN: [id number] Visit Date: 07/22/2020  Today's healthcare provider: Lelon Huh, MD   Chief Complaint  Patient presents with  . Edema   Subjective    HPI   Edema: Patient complains of edema. The location of the edema is ankle(s) bilateral.  The edema has been localized.  Onset of symptoms was several months ago, unchanged since that time. The edema is present all day. The patient states the problem is intermittent for several years.  The swelling has been aggravated by nothing, relieved by nothing, and been associated with nothing. Cardiac risk factors include advanced age (older than 71 for men, 65 for women) and male gender. His daughter states that he has been a little more short of breath than usual. He is also noted to have history of mild AS on previous echo from 2018. He denies chest pains, palpations, or syncope. He does have history of hypothyroidism with last TSH of 0.807 on 06/11/2019. Last hemoglobin was 12.6 on 08/29/2019.      Medications: Outpatient Medications Prior to Visit  Medication Sig  . amLODipine (NORVASC) 2.5 MG tablet Take 1 tablet by mouth once daily  . aspirin 81 MG tablet Take 81 mg by mouth daily.  . Azelastine HCl 137 MCG/SPRAY SOLN SMARTSIG:1-2 Spray(s) Both Nares Twice Daily  . brimonidine (ALPHAGAN) 0.2 % ophthalmic solution   . dutasteride (AVODART) 0.5 MG capsule Take 1 capsule by mouth once daily  . hydrochlorothiazide (HYDRODIURIL) 25 MG tablet Take 1/2 (one-half) tablet by mouth once daily  . ipratropium (ATROVENT) 0.06 % nasal spray   . Lipoic Acid 150 MG CAPS Take by mouth.  . lovastatin (MEVACOR) 40 MG tablet Take 1 tablet by mouth once daily  . montelukast (SINGULAIR) 10 MG tablet Take 1 tablet (10 mg total) by mouth at bedtime.  . Multiple Vitamin (DAILY VITAMIN PO) Take by mouth.  . sildenafil (VIAGRA) 100 MG tablet Take 0.5-1 tablets (50-100 mg  total) by mouth daily as needed for erectile dysfunction.  [name] SYNTHROID 125 MCG tablet Take 1 tablet by mouth once daily  . TRAVATAN Z 0.004 % SOLN ophthalmic solution   . silver sulfADIAZINE (SILVADENE) 1 % cream Apply 1 application topically daily. (Patient not taking: Reported on 07/22/2020)   No facility-administered medications prior to visit.    Review of Systems  Constitutional: Negative.   Respiratory: Negative.   Cardiovascular: Positive for leg swelling. Negative for chest pain and palpitations.  Gastrointestinal: Negative.   Musculoskeletal: Positive for arthralgias and joint swelling. Negative for back pain, gait problem, myalgias, neck pain and neck stiffness.  Neurological: Negative for dizziness, light-headedness and headaches.      Objective    BP (!) 108/60 (BP Location: Right Arm, Patient Position: Sitting, Cuff Size: Large)   Pulse 82   Temp (!) 96.6 F (35.9 C) (Temporal)   Wt (!) 212 lb (96.2 kg)   SpO2 96%   BMI 27.97 kg/m    Physical Exam   General: Appearance:     Overweight male in no acute distress  Eyes:    PERRL, conjunctiva/corneas clear, EOM's intact       Lungs:     Clear to auscultation bilaterally, respirations unlabored  Heart:    Normal heart rate. Normal rhythm. II/VI systolic murmur RUSB  MS:   All extremities are intact.   Neurologic:   Awake, alert, oriented x 3.  No apparent focal neurological           defect.   Ext:  3+ pitting edema bilateral ankles with 1+ pretibial edema bilaterally.        Assessment & Plan     1. Localized edema  - furosemide (LASIX) 20 MG tablet; Take 1 tablet (20 mg total) by mouth daily.  Dispense: 30 tablet; Refill: 3 - ECHOCARDIOGRAM COMPLETE; Future  2. Dyspnea, unspecified type  - CBC - Comprehensive metabolic panel - ECHOCARDIOGRAM COMPLETE; Future  3. Heart murmur  - B Nat Peptide - ECHOCARDIOGRAM COMPLETE; Future  4. Hypothyroidism, unspecified type  - TSH - T4, free  5. Pure  hypercholesterolemia He is tolerating lovastatin well with no adverse effects.   - Lipid panel   No follow-ups on file.      The entirety of the information documented in the History of Present Illness, Review of Systems and Physical Exam were personally obtained by me. Portions of this information were initially documented by the CMA and reviewed by me for thoroughness and accuracy.      Lelon Huh, MD  [facility] [phone] (phone) [phone] (fax)  [place]

## 2020-07-23 LAB — COMPREHENSIVE METABOLIC PANEL
ALT: 12 IU/L (ref 0–44)
AST: 22 IU/L (ref 0–40)
Albumin/Globulin Ratio: 1.7 (ref 1.2–2.2)
Albumin: 4.1 g/dL (ref 3.6–4.6)
Alkaline Phosphatase: 41 IU/L — ABNORMAL LOW (ref 48–121)
BUN/Creatinine Ratio: 14 (ref 10–24)
BUN: 17 mg/dL (ref 8–27)
Bilirubin Total: 0.6 mg/dL (ref 0.0–1.2)
CO2: 28 mmol/L (ref 20–29)
Calcium: 9.1 mg/dL (ref 8.6–10.2)
Chloride: 96 mmol/L (ref 96–106)
Creatinine, Ser: 1.18 mg/dL (ref 0.76–1.27)
GFR calc Af Amer: 66 mL/min/{1.73_m2} (ref >59)
GFR calc non Af Amer: 57 mL/min/{1.73_m2} — ABNORMAL LOW (ref >59)
Globulin, Total: 2.4 g/dL (ref 1.5–4.5)
Glucose: 99 mg/dL (ref 65–99)
Potassium: 3.8 mmol/L (ref 3.5–5.2)
Sodium: 135 mmol/L (ref 134–144)
Total Protein: 6.5 g/dL (ref 6.0–8.5)

## 2020-07-23 LAB — CBC
Hematocrit: 36.4 % — ABNORMAL LOW (ref 37.5–51.0)
Hemoglobin: 12.2 g/dL — ABNORMAL LOW (ref 13.0–17.7)
MCH: 30.3 pg (ref 26.6–33.0)
MCHC: 33.5 g/dL (ref 31.5–35.7)
MCV: 91 fL (ref 79–97)
Platelets: 223 10*3/uL (ref 150–450)
RBC: 4.02 x10E6/uL — ABNORMAL LOW (ref 4.14–5.80)
RDW: 12.4 % (ref 11.6–15.4)
WBC: 7.1 10*3/uL (ref 3.4–10.8)

## 2020-07-23 LAB — LIPID PANEL
Chol/HDL Ratio: 2.2 ratio (ref 0.0–5.0)
Cholesterol, Total: 149 mg/dL (ref 100–199)
HDL: 68 mg/dL (ref >39)
LDL Chol Calc (NIH): 64 mg/dL (ref 0–99)
Triglycerides: 89 mg/dL (ref 0–149)
VLDL Cholesterol Cal: 17 mg/dL (ref 5–40)

## 2020-07-23 LAB — BRAIN NATRIURETIC PEPTIDE: BNP: 41.4 pg/mL (ref 0.0–100.0)

## 2020-07-23 LAB — T4, FREE: Free T4: 1.85 ng/dL — ABNORMAL HIGH (ref 0.82–1.77)

## 2020-07-23 LAB — TSH: TSH: 1.19 u[IU]/mL (ref 0.450–4.500)

## 2020-07-24 ENCOUNTER — Telehealth: Payer: Self-pay

## 2020-07-24 NOTE — Telephone Encounter (Signed)
-----   Message from [name], MD sent at 07/24/2020  4:37 PM EDT ----- Labs are all normal. Should continue furosemide 3-4 days a week as needed for swelling and see how echo cardiogram looks that is scheduled for August.

## 2020-07-24 NOTE — Telephone Encounter (Signed)
Attempted to contact patient, no answer or voicemail. Okay for PEC to advise patient.  

## 2020-07-24 NOTE — Telephone Encounter (Signed)
Reviewed lab results and physician's note with the patient. Discussed elevated lower legs and feet to help with the swelling. No further questions.

## 2020-07-29 DIAGNOSIS — R2689 Other abnormalities of gait and mobility: Secondary | ICD-10-CM | POA: Diagnosis not present

## 2020-07-29 DIAGNOSIS — G609 Hereditary and idiopathic neuropathy, unspecified: Secondary | ICD-10-CM | POA: Diagnosis not present

## 2020-07-30 ENCOUNTER — Telehealth: Payer: Self-pay

## 2020-07-30 NOTE — Telephone Encounter (Signed)
Copied from [place] [id number]. Topic: General - Call Back - No Documentation >> Jul 30, 2020 10:44 AM Erick Blinks wrote: Reason for CRM: Pt called report that the medication he has been prescribed to reduce the swelling in his ankles is not effective. Please advise, pt was instructed to call the office if med did not work.  Best contact: [phone]

## 2020-07-30 NOTE — Telephone Encounter (Signed)
Please clarify. He is already taking 1 tablet daily of Furosemide 20mg  tablets.

## 2020-07-30 NOTE — Telephone Encounter (Signed)
He can take tablet a day if needed.

## 2020-07-30 NOTE — Telephone Encounter (Signed)
He can take 2 tablets a day as needed.

## 2020-07-30 NOTE — Telephone Encounter (Signed)
Patient advised.

## 2020-07-30 NOTE — Telephone Encounter (Signed)
I called and spoke with patient. He says he has been taking Furosemide 20mg  every day, but hasn't seen any improvement in his ankle swelling. He also states that he thought this medication would cause him to have to urinate more frequently, but hasn't noticed any change in urination. Patient wants to know if the medication needs to be increased.

## 2020-08-05 IMAGING — CR DG CHEST 2V
1 series · 2 of 2 positions shown · non-contrast
Comparison: 01/27/2015.

CLINICAL DATA: Cough for the past month.

EXAM:
CHEST - 2 VIEW

[Series 1: dg chest 2 view · 0.14mm/px · 2 of 2 slices shown]
[im 1/2]
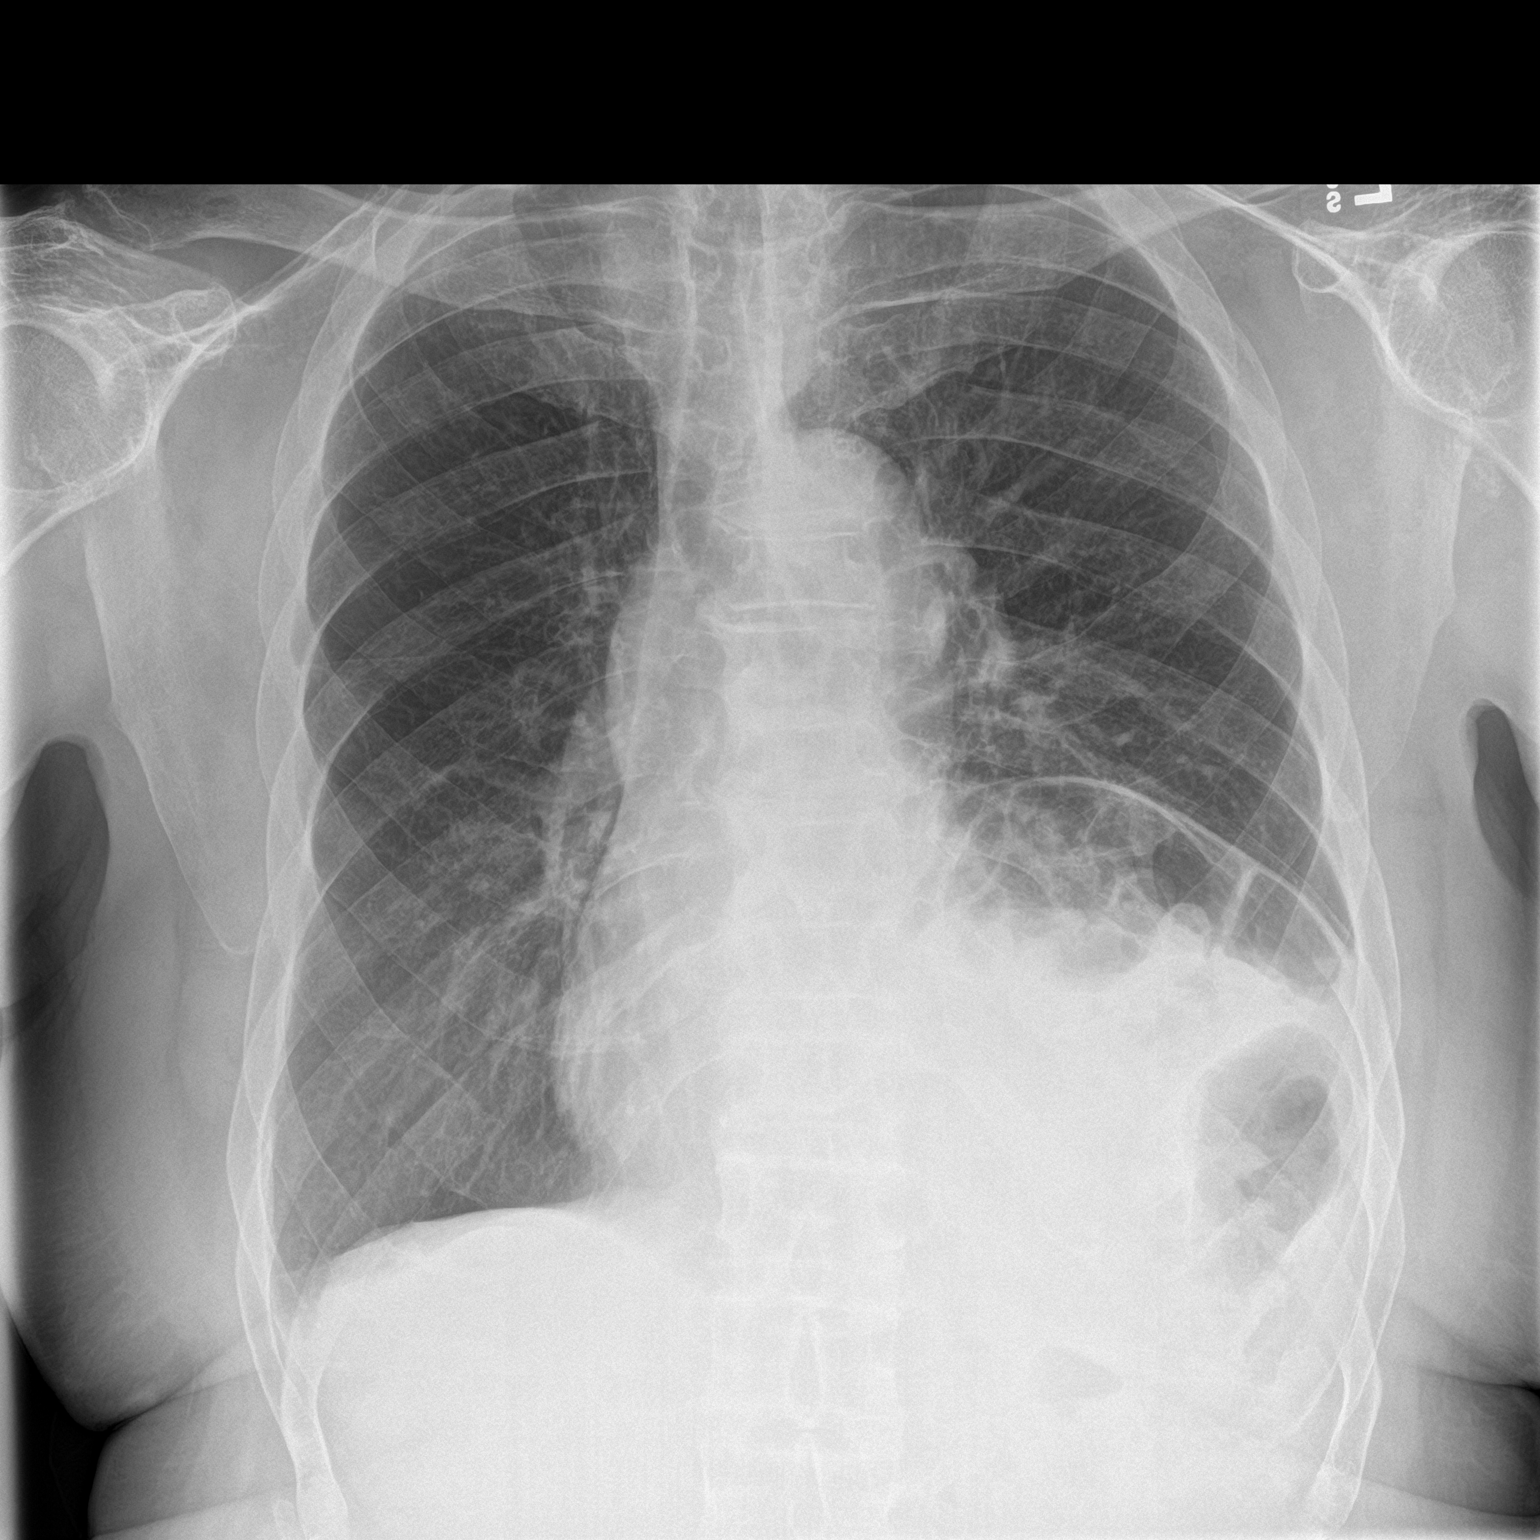
[im 2/2]
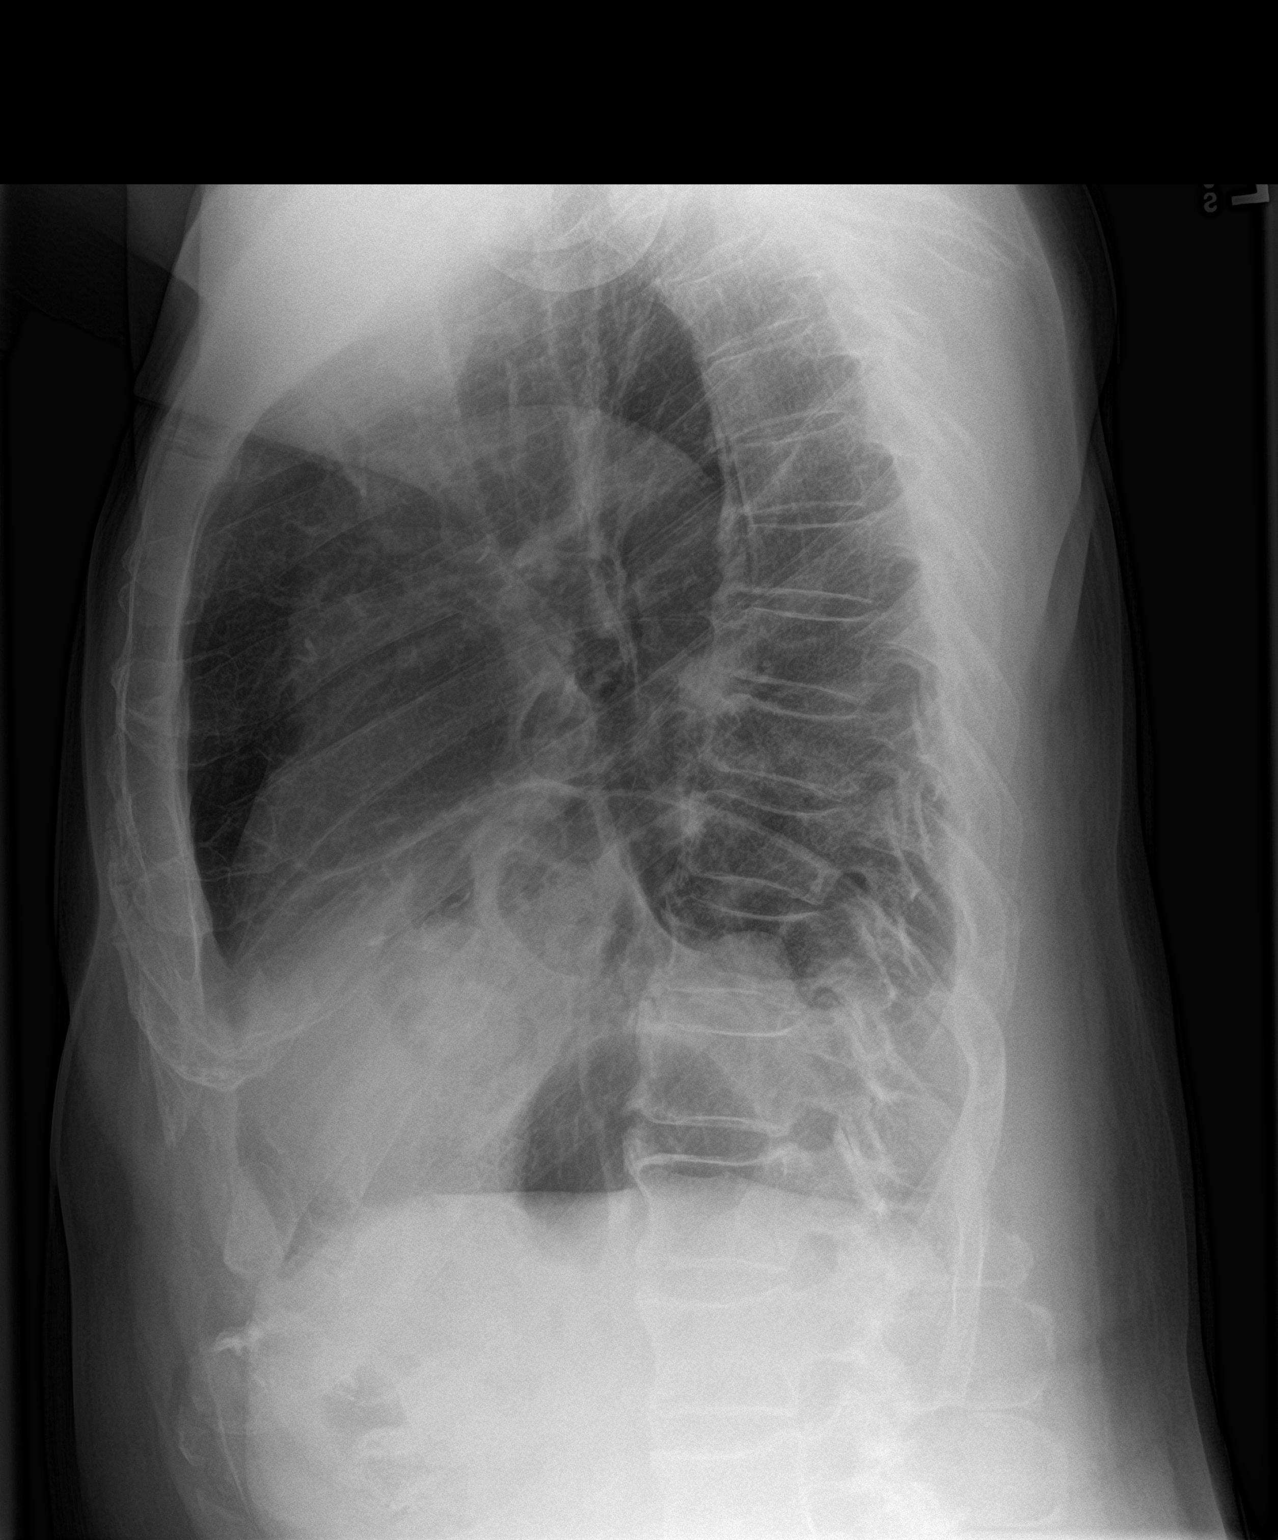

[2 of 2 positions shown; findings below may reference images not displayed]

FINDINGS: Stable marked elevation of the left hemidiaphragm. The right lung
remains hyperexpanded with stable mild diffuse peribronchial
thickening and accentuation of the interstitial markings. Normal
sized heart. Thoracic spine degenerative changes.
IMPRESSION: No acute abnormality. Stable marked elevation of the left
hemidiaphragm and changes of COPD and chronic bronchitis.

## 2020-08-20 ENCOUNTER — Other Ambulatory Visit: Payer: Self-pay

## 2020-08-20 ENCOUNTER — Ambulatory Visit (INDEPENDENT_AMBULATORY_CARE_PROVIDER_SITE_OTHER): Payer: Medicare HMO

## 2020-08-20 DIAGNOSIS — R011 Cardiac murmur, unspecified: Secondary | ICD-10-CM | POA: Diagnosis not present

## 2020-08-20 DIAGNOSIS — R600 Localized edema: Secondary | ICD-10-CM | POA: Diagnosis not present

## 2020-08-20 DIAGNOSIS — R0600 Dyspnea, unspecified: Secondary | ICD-10-CM | POA: Diagnosis not present

## 2020-08-20 LAB — ECHOCARDIOGRAM COMPLETE
AV Mean grad: 13.2 mmHg
AV Peak grad: 21.2 mmHg
Ao pk vel: 2.3 m/s
Area-P 1/2: 1.75 cm2

## 2020-08-25 ENCOUNTER — Other Ambulatory Visit: Payer: Self-pay

## 2020-08-25 ENCOUNTER — Ambulatory Visit (INDEPENDENT_AMBULATORY_CARE_PROVIDER_SITE_OTHER): Payer: Medicare HMO | Admitting: Family Medicine

## 2020-08-25 ENCOUNTER — Encounter: Payer: Self-pay | Admitting: Family Medicine

## 2020-08-25 VITALS — BP 148/73 | HR 67 | Temp 98.2°F | Resp 16 | Ht 73.0 in | Wt 216.0 lb

## 2020-08-25 DIAGNOSIS — R011 Cardiac murmur, unspecified: Secondary | ICD-10-CM | POA: Diagnosis not present

## 2020-08-25 DIAGNOSIS — E039 Hypothyroidism, unspecified: Secondary | ICD-10-CM

## 2020-08-25 DIAGNOSIS — E7800 Pure hypercholesterolemia, unspecified: Secondary | ICD-10-CM | POA: Diagnosis not present

## 2020-08-25 DIAGNOSIS — I10 Essential (primary) hypertension: Secondary | ICD-10-CM

## 2020-08-25 DIAGNOSIS — Z0000 Encounter for general adult medical examination without abnormal findings: Secondary | ICD-10-CM | POA: Diagnosis not present

## 2020-08-25 DIAGNOSIS — I519 Heart disease, unspecified: Secondary | ICD-10-CM | POA: Diagnosis not present

## 2020-08-25 MED ORDER — SYNTHROID 125 MCG PO TABS: 125.0000 ug | ORAL_TABLET | Freq: Every day | ORAL | 4 refills | Status: DC

## 2020-08-25 MED ORDER — HYDROCHLOROTHIAZIDE 25 MG PO TABS: 12.5000 mg | ORAL_TABLET | Freq: Every day | ORAL | 0 refills | Status: DC

## 2020-08-25 NOTE — Progress Notes (Signed)
Complete physical exam   Patient: Daniel Carr   DOB: 10-10-1935   85 y.o. Male  MRN: [id number] Visit Date: 08/25/2020  Today's healthcare provider: Lelon Huh, MD    Subjective    Daniel Carr is a 85 y.o. male who presents today for a complete physical exam.   HPI   Hypothyroid, follow-up  Lab Results  Component Value Date   TSH 1.190 07/22/2020   TSH 0.807 06/11/2019   TSH 1.870 05/10/2018   FREET4 1.85 (H) 07/22/2020   Wt Readings from Last 3 Encounters:  08/25/20 216 lb (98 kg)  07/22/20 (!) 212 lb (96.2 kg)  11/12/19 211 lb 9.6 oz (96 kg)    He was last seen for hypothyroid 1 month ago.  Management since that visit includes continue same medication. He reports good compliance with treatment. He is not having side effects.   Symptoms: No change in energy level Yes constipation  No diarrhea No heat / cold intolerance  No nervousness No palpitations  No weight changes    ----------------------------------------------------------------------------------------- Lipid/Cholesterol, Follow-up  Last lipid panel Other pertinent labs  Lab Results  Component Value Date   CHOL 149 07/22/2020   HDL 68 07/22/2020   LDLCALC 64 07/22/2020   TRIG 89 07/22/2020   CHOLHDL 2.2 07/22/2020   Lab Results  Component Value Date   ALT 12 07/22/2020   AST 22 07/22/2020   PLT 223 07/22/2020   TSH 1.190 07/22/2020     He was last seen for this 1 months ago.  Management since that visit includes continue same medication.  He reports good compliance with treatment. He is not having side effects.   Symptoms: No chest pain No chest pressure/discomfort  No dyspnea No lower extremity edema  No numbness or tingling of extremity No orthopnea  No palpitations No paroxysmal nocturnal dyspnea  No speech difficulty No syncope   Current diet: well balanced Current exercise: none  The ASCVD Risk score ([place]., et al., 2013) failed to calculate for the following  reasons:   The 2013 ASCVD risk score is only valid for ages 31 to 75  --------------------------------------------------------------------------------------------------- Hypertension, follow-up  BP Readings from Last 3 Encounters:  08/25/20 (!) 148/73  07/22/20 (!) 108/60  11/12/19 134/80   Wt Readings from Last 3 Encounters:  08/25/20 216 lb (98 kg)  07/22/20 (!) 212 lb (96.2 kg)  11/12/19 211 lb 9.6 oz (96 kg)     He was last seen for hypertension 1 year ago.  BP at that visit was 108/66. Management since that visit includes continuing same medication. Since last visit, patient was started on Furosemide to help with edema.   He reports good compliance with treatment. He is not having side effects.  He is following a Regular diet. He is not exercising. He does not smoke.  Use of agents associated with hypertension: NSAIDS.   Outside blood pressures are checked occasionally (patient unsure of the readings) Symptoms: No chest pain No chest pressure  No palpitations No syncope  Yes dyspnea No orthopnea  No paroxysmal nocturnal dyspnea Yes lower extremity edema   Pertinent labs: Lab Results  Component Value Date   CHOL 149 07/22/2020   HDL 68 07/22/2020   LDLCALC 64 07/22/2020   TRIG 89 07/22/2020   CHOLHDL 2.2 07/22/2020   Lab Results  Component Value Date   NA 135 07/22/2020   K 3.8 07/22/2020   CREATININE 1.18 07/22/2020   GFRNONAA 57 (L) 07/22/2020  GFRAA 66 07/22/2020   GLUCOSE 99 07/22/2020     The ASCVD Risk score Mikey Bussing DC Jr., et al., 2013) failed to calculate for the following reasons:   The 2013 ASCVD risk score is only valid for ages 34 to 44   ---------------------------------------------------------------------------------------------------   Past Medical History:  Diagnosis Date  . childhood infection    chicken pox, measels, and mumps  . Sinus problem   . Ulnar neuropathy    Past Surgical History:  Procedure Laterality Date  .  APPENDECTOMY    . CATARACT EXTRACTION Bilateral   . KNEE SURGERY  10/04/2011  . NM MYOVIEW ([place] HX)  07/21/2017   [place]. No ischemia.   [name] RETINAL DETACHMENT REPAIR W/ SCLERAL BUCKLE LE     Social History   Socioeconomic History  . Marital status: Married    Spouse name: Not on file  . Number of children: 1  . Years of education: college  . Highest education level: Not on file  Occupational History  . Occupation: Retired  Tobacco Use  . Smoking status: Former Smoker    Packs/day: 0.25    Years: 6.00    Pack years: 1.50    Quit date: 11/13/1969    Years since quitting: 50.8  . Smokeless tobacco: Never Used  . Tobacco comment: quit in 1970's  Vaping Use  . Vaping Use: Never used  Substance and Sexual Activity  . Alcohol use: No    Alcohol/week: 0.0 standard drinks  . Drug use: No  . Sexual activity: Yes  Other Topics Concern  . Not on file  Social History Narrative  . Not on file   Social Determinants of Health   Financial Resource Strain:   . Difficulty of Paying Living Expenses: Not on file  Food Insecurity:   . Worried About Charity fundraiser in the Last Year: Not on file  . Ran Out of Food in the Last Year: Not on file  Transportation Needs:   . Lack of Transportation (Medical): Not on file  . Lack of Transportation (Non-Medical): Not on file  Physical Activity:   . Days of Exercise per Week: Not on file  . Minutes of Exercise per Session: Not on file  Stress:   . Feeling of Stress : Not on file  Social Connections:   . Frequency of Communication with Friends and Family: Not on file  . Frequency of Social Gatherings with Friends and Family: Not on file  . Attends Religious Services: Not on file  . Active Member of Clubs or Organizations: Not on file  . Attends Archivist Meetings: Not on file  . Marital Status: Not on file  Intimate Partner Violence:   . Fear of Current or Ex-Partner: Not on file  . Emotionally Abused: Not on file  .  Physically Abused: Not on file  . Sexually Abused: Not on file   Family Status  Relation Name Status  . Sister  Deceased  . Mother  Deceased  . Father  Deceased  . Sister  Alive  . Brother  Deceased  . Brother  Deceased  . Sister  Alive  . Brother  Deceased   Family History  Problem Relation Age of Onset  . Stroke Sister   . Hypertension Mother   . Stroke Father   . Hypertension Father    No Known Allergies  Patient Care Team: [name], MD as PCP - General (Family Medicine) Dingeldein, Remo Lipps, MD as Consulting Physician (Ophthalmology)  Medications: Outpatient Medications Prior to Visit  Medication Sig  . amLODipine (NORVASC) 2.5 MG tablet Take 1 tablet by mouth once daily  . aspirin 81 MG tablet Take 81 mg by mouth daily.  . Azelastine HCl 137 MCG/SPRAY SOLN SMARTSIG:1-2 Spray(s) Both Nares Twice Daily  . brimonidine (ALPHAGAN) 0.2 % ophthalmic solution   . dutasteride (AVODART) 0.5 MG capsule Take 1 capsule by mouth once daily  . furosemide (LASIX) 20 MG tablet Take 1 tablet (20 mg total) by mouth daily.  . hydrochlorothiazide (HYDRODIURIL) 25 MG tablet Take 1/2 (one-half) tablet by mouth once daily  . ipratropium (ATROVENT) 0.06 % nasal spray   . Lipoic Acid 150 MG CAPS Take by mouth.  . lovastatin (MEVACOR) 40 MG tablet Take 1 tablet by mouth once daily  . montelukast (SINGULAIR) 10 MG tablet Take 1 tablet (10 mg total) by mouth at bedtime.  . Multiple Vitamin (DAILY VITAMIN PO) Take by mouth.  . SYNTHROID 125 MCG tablet Take 1 tablet by mouth once daily  . TRAVATAN Z 0.004 % SOLN ophthalmic solution   . [DISCONTINUED] sildenafil (VIAGRA) 100 MG tablet Take 0.5-1 tablets (50-100 mg total) by mouth daily as needed for erectile dysfunction. (Patient not taking: Reported on 08/25/2020)  . [DISCONTINUED] silver sulfADIAZINE (SILVADENE) 1 % cream Apply 1 application topically daily. (Patient not taking: Reported on 08/25/2020)   No facility-administered medications  prior to visit.      Objective    BP (!) 148/73 (BP Location: Left Arm, Cuff Size: Normal)   Pulse 67   Temp 98.2 F (36.8 C) (Oral)   Resp 16   Ht 6\' 1"  (1.854 m)   Wt 216 lb (98 kg)   BMI 28.50 kg/m   Physical Exam   General Appearance:     Overweight male. Alert, cooperative, in no acute distress, appears stated age  Head:    Normocephalic, without obvious abnormality, atraumatic  Eyes:    PERRL, conjunctiva/corneas clear, EOM's intact, fundi    benign, both eyes       Ears:    Normal TM's and external ear canals, both ears  Nose:   Nares normal, septum midline, mucosa normal, no drainage   or sinus tenderness  Throat:   Lips, mucosa, and tongue normal; teeth and gums normal  Neck:   Supple, symmetrical, trachea midline, no adenopathy;       thyroid:  No enlargement/tenderness/nodules; no carotid   bruit or JVD  Back:     Symmetric, no curvature, ROM normal, no CVA tenderness  Lungs:     Clear to auscultation bilaterally, respirations unlabored  Chest wall:    No tenderness or deformity  Heart:    Normal heart rate. Normal rhythm. III/VI Systolic murmur RUSB, no rubs, or gallops.  S1 and S2 normal  Abdomen:     Soft, non-tender, bowel sounds active all four quadrants,    no masses, no organomegaly  Genitalia:    deferred  Rectal:    deferred  Extremities:   All extremities are intact. No cyanosis or edema  Pulses:   2+ and symmetric all extremities  Skin:   Skin color, texture, turgor normal, no rashes or lesions  Lymph nodes:   Cervical, supraclavicular, and axillary nodes normal  Neurologic:   CNII-XII intact. Normal strength, sensation and reflexes      throughout     Last depression screening scores PHQ 2/9 Scores 08/25/2020 06/11/2019 06/09/2018  PHQ - 2 Score 0 0 0  PHQ- 9  Score - 1 2   Last fall risk screening Fall Risk  08/25/2020  Falls in the past year? 0  Number falls in past yr: 0  Injury with Fall? 0  Follow up Falls evaluation completed   Last  Audit-C alcohol use screening Alcohol Use Disorder Test (AUDIT) 08/25/2020  1. How often do you have a drink containing alcohol? 0  2. How many drinks containing alcohol do you have on a typical day when you are drinking? 0  3. How often do you have six or more drinks on one occasion? 0  AUDIT-C Score 0  Alcohol Brief Interventions/Follow-up AUDIT Score <7 follow-up not indicated   A score of 3 or more in women, and 4 or more in men indicates increased risk for alcohol abuse, EXCEPT if all of the points are from question 1   No results found for any visits on 08/25/20.  Assessment & Plan    Routine Health Maintenance and Physical Exam  Exercise Activities and Dietary recommendations Goals    . Increase water intake     Recommend increasing water intake to 4-6 glasses a day.       Immunization History  Administered Date(s) Administered  . Fluad Quad(high Dose 65+) 10/23/2019  . Influenza, High Dose Seasonal PF 10/07/2015, 10/16/2016, 09/02/2017, 09/19/2018  . Influenza-Unspecified 09/19/2014  . Pneumococcal Conjugate-13 06/03/2014  . Pneumococcal Polysaccharide-23 04/16/2003  . Td 03/04/1997  . Tdap 11/09/2011  . Zoster 05/09/2008, 06/03/2014    Health Maintenance  Topic Date Due  . COVID-19 Vaccine (1) Never done  . INFLUENZA VACCINE  07/27/2020  . TETANUS/TDAP  11/08/2021  . PNA vac Low Risk Adult  Completed    Discussed health benefits of physical activity, and encouraged him to engage in regular exercise appropriate for his age and condition.  1. Hypothyroidism, unspecified type Now euthyroid. Continue current medications.   - SYNTHROID 125 MCG tablet; Take 1 tablet (125 mcg total) by mouth daily.  Dispense: 90 tablet; Refill: 4  2. Hypertension, benign BP labile, usually better controled than today.  - hydrochlorothiazide (HYDRODIURIL) 25 MG tablet; Take 0.5 tablets (12.5 mg total) by mouth daily.  Dispense: 45 tablet; Refill: 0 - EKG 12-Lead  3. Pure  hypercholesterolemia He is tolerating lovastatin well with no adverse effects.    4. Systolic murmur Likely AS. Minimally symptomatic. Unable to adequately visualize valves on recent echo.  5. Systolic dysfunction Recent BNP was normal, is minimally symptomatic. Continue current medications for now.   No follow-ups on file.     The entirety of the information documented in the History of Present Illness, Review of Systems and Physical Exam were personally obtained by me. Portions of this information were initially documented by the CMA and reviewed by me for thoroughness and accuracy.      Lelon Huh, MD  [facility] [phone] (phone) [phone] (fax)  [place]

## 2020-08-25 NOTE — Progress Notes (Signed)
Annual Wellness Visit     Patient: Daniel Carr, Male    DOB: [birth year], 85 y.o.   MRN: [id number] Visit Date: 08/25/2020  Today's Provider: Lelon Huh, MD   Chief Complaint  Patient presents with  . Medicare Wellness   Subjective    Daniel Carr is a 85 y.o. male who presents today for his Annual Wellness Visit. He reports consuming a general diet. The patient does not participate in regular exercise at present. He generally feels fairly well. He reports sleeping fairly well. He does not have additional problems to discuss today.   HPI    Medications: Outpatient Medications Prior to Visit  Medication Sig  . amLODipine (NORVASC) 2.5 MG tablet Take 1 tablet by mouth once daily  . aspirin 81 MG tablet Take 81 mg by mouth daily.  . Azelastine HCl 137 MCG/SPRAY SOLN SMARTSIG:1-2 Spray(s) Both Nares Twice Daily  . brimonidine (ALPHAGAN) 0.2 % ophthalmic solution   . dutasteride (AVODART) 0.5 MG capsule Take 1 capsule by mouth once daily  . furosemide (LASIX) 20 MG tablet Take 1 tablet (20 mg total) by mouth daily.  . hydrochlorothiazide (HYDRODIURIL) 25 MG tablet Take 1/2 (one-half) tablet by mouth once daily  . ipratropium (ATROVENT) 0.06 % nasal spray   . Lipoic Acid 150 MG CAPS Take by mouth.  . lovastatin (MEVACOR) 40 MG tablet Take 1 tablet by mouth once daily  . montelukast (SINGULAIR) 10 MG tablet Take 1 tablet (10 mg total) by mouth at bedtime.  . Multiple Vitamin (DAILY VITAMIN PO) Take by mouth.  . SYNTHROID 125 MCG tablet Take 1 tablet by mouth once daily  . TRAVATAN Z 0.004 % SOLN ophthalmic solution   . [DISCONTINUED] sildenafil (VIAGRA) 100 MG tablet Take 0.5-1 tablets (50-100 mg total) by mouth daily as needed for erectile dysfunction. (Patient not taking: Reported on 08/25/2020)  . [DISCONTINUED] silver sulfADIAZINE (SILVADENE) 1 % cream Apply 1 application topically daily. (Patient not taking: Reported on 08/25/2020)   No facility-administered medications  prior to visit.    No Known Allergies  Patient Care Team: [name], MD as PCP - General (Family Medicine) Dingeldein, Remo Lipps, MD as Consulting Physician (Ophthalmology)  Review of Systems  Constitutional: Negative for appetite change, chills, fatigue and fever.  HENT: Positive for rhinorrhea. Negative for congestion, ear pain, hearing loss, nosebleeds and trouble swallowing.   Eyes: Negative for pain and visual disturbance.  Respiratory: Positive for shortness of breath. Negative for cough and chest tightness.   Cardiovascular: Negative for chest pain, palpitations and leg swelling.  Gastrointestinal: Positive for constipation. Negative for abdominal pain, blood in stool, diarrhea, nausea and vomiting.  Endocrine: Negative for polydipsia, polyphagia and polyuria.  Genitourinary: Negative for dysuria and flank pain.  Musculoskeletal: Positive for arthralgias and joint swelling (ankle swelling). Negative for back pain, myalgias and neck stiffness.  Skin: Negative for color change, rash and wound.  Neurological: Negative for dizziness, tremors, seizures, speech difficulty, weakness, light-headedness and headaches.  Psychiatric/Behavioral: Negative for behavioral problems, confusion, decreased concentration, dysphoric mood and sleep disturbance. The patient is not nervous/anxious.   All other systems reviewed and are negative.    Objective    Vitals: BP (!) 148/73 (BP Location: Left Arm, Cuff Size: Normal)   Pulse 67   Temp 98.2 F (36.8 C) (Oral)   Resp 16   Ht 6\' 1"  (1.854 m)   Wt 216 lb (98 kg)   BMI 28.50 kg/m     Most recent functional status  assessment: In your present state of health, do you have any difficulty performing the following activities: 08/25/2020  Hearing? N  Vision? N  Difficulty concentrating or making decisions? N  Walking or climbing stairs? Y  Dressing or bathing? N  Doing errands, shopping? N  Some recent data might be hidden   Most recent  fall risk assessment: Fall Risk  08/25/2020  Falls in the past year? 0  Number falls in past yr: 0  Injury with Fall? 0  Follow up Falls evaluation completed    Most recent depression screenings: PHQ 2/9 Scores 08/25/2020 06/11/2019  PHQ - 2 Score 0 0  PHQ- 9 Score - 1   Most recent cognitive screening: 6CIT Screen 08/25/2020  What Year? 0 points  What month? 0 points  What time? 0 points  Count back from 20 0 points  Months in reverse 0 points  Repeat phrase 0 points  Total Score 0   Most recent Audit-C alcohol use screening Alcohol Use Disorder Test (AUDIT) 08/25/2020  1. How often do you have a drink containing alcohol? 0  2. How many drinks containing alcohol do you have on a typical day when you are drinking? 0  3. How often do you have six or more drinks on one occasion? 0  AUDIT-C Score 0  Alcohol Brief Interventions/Follow-up AUDIT Score <7 follow-up not indicated   A score of 3 or more in women, and 4 or more in men indicates increased risk for alcohol abuse, EXCEPT if all of the points are from question 1     No results found for any visits on 08/25/20.  Assessment & Plan     Annual wellness visit done today including the all of the following: Reviewed patient's Family Medical History Reviewed and updated list of patient's medical providers Assessment of cognitive impairment was done Assessed patient's functional ability Established a written schedule for health screening [place] Completed and Reviewed  Exercise Activities and Dietary recommendations Goals    . Increase water intake     Recommend increasing water intake to 4-6 glasses a day.       Immunization History  Administered Date(s) Administered  . Fluad Quad(high Dose 65+) 10/23/2019  . Influenza, High Dose Seasonal PF 10/07/2015, 10/16/2016, 09/02/2017, 09/19/2018  . Influenza-Unspecified 09/19/2014  . Pneumococcal Conjugate-13 06/03/2014  . Pneumococcal  Polysaccharide-23 04/16/2003  . Td 03/04/1997  . Tdap 11/09/2011  . Zoster 05/09/2008, 06/03/2014    Health Maintenance  Topic Date Due  . COVID-19 Vaccine (1) Never done  . INFLUENZA VACCINE  07/27/2020  . TETANUS/TDAP  11/08/2021  . PNA vac Low Risk Adult  Completed     Discussed health benefits of physical activity, and encouraged him to engage in regular exercise appropriate for his age and condition.      No follow-ups on file.     The entirety of the information documented in the History of Present Illness, Review of Systems and Physical Exam were personally obtained by me. Portions of this information were initially documented by the CMA and reviewed by me for thoroughness and accuracy.      Lelon Huh, MD  [facility] [phone] (phone) [phone] (fax)  [place]

## 2020-08-25 NOTE — Patient Instructions (Signed)
.   Please review the attached list of medications and notify my office if there are any errors.   . Please bring all of your medications to every appointment so we can make sure that our medication list is the same as yours.   . We will be scheduling flu vaccines after Labor Day. Please call our office at [phone] after Labor Day to schedule your flu vaccine.

## 2020-08-29 NOTE — Addendum Note (Signed)
Addended by: Lelon Huh E on: 08/29/2020 11:07 AM   Modules accepted: Level of Service

## 2020-09-04 ENCOUNTER — Inpatient Hospital Stay: Payer: Medicare HMO | Attending: Oncology

## 2020-09-04 ENCOUNTER — Other Ambulatory Visit: Payer: Self-pay

## 2020-09-04 DIAGNOSIS — Z87891 Personal history of nicotine dependence: Secondary | ICD-10-CM | POA: Diagnosis not present

## 2020-09-04 DIAGNOSIS — Z7982 Long term (current) use of aspirin: Secondary | ICD-10-CM | POA: Insufficient documentation

## 2020-09-04 DIAGNOSIS — D472 Monoclonal gammopathy: Secondary | ICD-10-CM

## 2020-09-04 DIAGNOSIS — Z79899 Other long term (current) drug therapy: Secondary | ICD-10-CM | POA: Insufficient documentation

## 2020-09-04 LAB — CBC WITH DIFFERENTIAL/PLATELET
Abs Immature Granulocytes: 0.01 10*3/uL (ref 0.00–0.07)
Basophils Absolute: 0 10*3/uL (ref 0.0–0.1)
Basophils Relative: 0 %
Eosinophils Absolute: 0.1 10*3/uL (ref 0.0–0.5)
Eosinophils Relative: 1 %
HCT: 38.4 % — ABNORMAL LOW (ref 39.0–52.0)
Hemoglobin: 12.9 g/dL — ABNORMAL LOW (ref 13.0–17.0)
Immature Granulocytes: 0 %
Lymphocytes Relative: 42 %
Lymphs Abs: 3.1 10*3/uL (ref 0.7–4.0)
MCH: 30.8 pg (ref 26.0–34.0)
MCHC: 33.6 g/dL (ref 30.0–36.0)
MCV: 91.6 fL (ref 80.0–100.0)
Monocytes Absolute: 0.8 10*3/uL (ref 0.1–1.0)
Monocytes Relative: 10 %
Neutro Abs: 3.5 10*3/uL (ref 1.7–7.7)
Neutrophils Relative %: 47 %
Platelets: 184 10*3/uL (ref 150–400)
RBC: 4.19 MIL/uL — ABNORMAL LOW (ref 4.22–5.81)
RDW: 12.9 % (ref 11.5–15.5)
WBC: 7.4 10*3/uL (ref 4.0–10.5)
nRBC: 0 % (ref 0.0–0.2)

## 2020-09-05 LAB — PROTEIN ELECTROPHORESIS, SERUM
A/G Ratio: 1.3 (ref 0.7–1.7)
Albumin ELP: 3.8 g/dL (ref 2.9–4.4)
Alpha-1-Globulin: 0.2 g/dL (ref 0.0–0.4)
Alpha-2-Globulin: 0.5 g/dL (ref 0.4–1.0)
Beta Globulin: 0.7 g/dL (ref 0.7–1.3)
Gamma Globulin: 1.5 g/dL (ref 0.4–1.8)
Globulin, Total: 3 g/dL (ref 2.2–3.9)
M-Spike, %: 1 g/dL — ABNORMAL HIGH
Total Protein ELP: 6.8 g/dL (ref 6.0–8.5)

## 2020-09-05 LAB — IGG, IGA, IGM
IgA: 165 mg/dL (ref 61–437)
IgG (Immunoglobin G), Serum: 1477 mg/dL (ref 603–1613)
IgM (Immunoglobulin M), Srm: 56 mg/dL (ref 15–143)

## 2020-09-05 LAB — KAPPA/LAMBDA LIGHT CHAINS
Kappa free light chain: 27.6 mg/L — ABNORMAL HIGH (ref 3.3–19.4)
Kappa, lambda light chain ratio: 1.97 — ABNORMAL HIGH (ref 0.26–1.65)
Lambda free light chains: 14 mg/L (ref 5.7–26.3)

## 2020-09-07 NOTE — Progress Notes (Signed)
Daniel Carr  [phone] [phone]  ID: Daniel Carr OB: 21-Jan-1935  MR#: [id number]  FFM#:[id number]  Patient Care Team: [name], MD as PCP - General (Family Medicine) Dingeldein, Remo Lipps, MD as Consulting Physician (Ophthalmology)  CHIEF COMPLAINT: MGUS  INTERVAL HISTORY: Patient returns to clinic today for repeat laboratory work and routine yearly evaluation.  He continues to feel well and remains asymptomatic. He has no neurologic complaints.  He denies any recent fevers or illnesses.  He has a good appetite and denies weight loss.  He denies any chest pain, shortness of breath, cough, or hemoptysis.  He denies any nausea, vomiting, constipation, or diarrhea.  He has no urinary complaints.  Patient feels at his baseline offers no specific complaints today.  REVIEW OF SYSTEMS:   Review of Systems  Constitutional: Negative.  Negative for fever, malaise/fatigue and weight loss.  Respiratory: Negative.  Negative for cough, hemoptysis and shortness of breath.   Cardiovascular: Negative.  Negative for chest pain and leg swelling.  Gastrointestinal: Negative.  Negative for abdominal pain.  Genitourinary: Negative.  Negative for dysuria.  Musculoskeletal: Negative.  Negative for joint pain.  Skin: Negative.  Negative for rash.  Neurological: Negative.  Negative for dizziness, focal weakness, weakness and headaches.  Psychiatric/Behavioral: Negative.  The patient is not nervous/anxious.     As per HPI. Otherwise, a complete review of systems is negative.  PAST MEDICAL HISTORY: Past Medical History:  Diagnosis Date  . childhood infection    chicken pox, measels, and mumps  . Sinus problem   . Ulnar neuropathy     PAST SURGICAL HISTORY: Past Surgical History:  Procedure Laterality Date  . APPENDECTOMY    . CATARACT EXTRACTION Bilateral   . KNEE SURGERY  10/04/2011  . NM MYOVIEW ([place] HX)  07/21/2017   [facility]. No ischemia.   [name] RETINAL  DETACHMENT REPAIR W/ SCLERAL BUCKLE LE      FAMILY HISTORY: Family History  Problem Relation Age of Onset  . Stroke Sister   . Hypertension Mother   . Stroke Father   . Hypertension Father     ADVANCED DIRECTIVES (Y/N):  N  HEALTH MAINTENANCE: Social History   Tobacco Use  . Smoking status: Former Smoker    Packs/day: 0.25    Years: 6.00    Pack years: 1.50    Quit date: 11/13/1969    Years since quitting: 50.8  . Smokeless tobacco: Never Used  . Tobacco comment: quit in 1970's  Vaping Use  . Vaping Use: Never used  Substance Use Topics  . Alcohol use: No    Alcohol/week: 0.0 standard drinks  . Drug use: No     Colonoscopy:  PAP:  Bone density:  Lipid panel:  No Known Allergies  Current Outpatient Medications  Medication Sig Dispense Refill  . amLODipine (NORVASC) 2.5 MG tablet Take 1 tablet by mouth once daily 90 tablet 4  . aspirin 81 MG tablet Take 81 mg by mouth daily.    . Azelastine HCl 137 MCG/SPRAY SOLN SMARTSIG:1-2 Spray(s) Both Nares Twice Daily    . brimonidine (ALPHAGAN) 0.2 % ophthalmic solution     . dutasteride (AVODART) 0.5 MG capsule Take 1 capsule by mouth once daily 90 capsule 4  . furosemide (LASIX) 20 MG tablet Take 1 tablet (20 mg total) by mouth daily. 30 tablet 3  . hydrochlorothiazide (HYDRODIURIL) 25 MG tablet Take 0.5 tablets (12.5 mg total) by mouth daily. 45 tablet 0  . ipratropium (ATROVENT) 0.06 %  nasal spray   6  . Lipoic Acid 150 MG CAPS Take by mouth.    . lovastatin (MEVACOR) 40 MG tablet Take 1 tablet by mouth once daily 90 tablet 4  . montelukast (SINGULAIR) 10 MG tablet Take 1 tablet (10 mg total) by mouth at bedtime. 30 tablet 11  . Multiple Vitamin (DAILY VITAMIN PO) Take by mouth.    . SYNTHROID 125 MCG tablet Take 1 tablet (125 mcg total) by mouth daily. 90 tablet 4  . TRAVATAN Z 0.004 % SOLN ophthalmic solution      No current facility-administered medications for this visit.    OBJECTIVE: Vitals:   09/12/20  1056  BP: 115/73  Pulse: 85  Resp: 20  Temp: 98.6 F (37 C)  SpO2: 96%     Body mass index is 27.75 kg/m.    ECOG FS:0 - Asymptomatic  General: Well-developed, well-nourished, no acute distress. Eyes: Pink conjunctiva, anicteric sclera. HEENT: Normocephalic, moist mucous membranes. Lungs: No audible wheezing or coughing. Heart: Regular rate and rhythm. Abdomen: Soft, nontender, no obvious distention. Musculoskeletal: No edema, cyanosis, or clubbing. Neuro: Alert, answering all questions appropriately. Cranial nerves grossly intact. Skin: No rashes or petechiae noted. Psych: Normal affect.   LAB RESULTS:  Lab Results  Component Value Date   NA 135 07/22/2020   K 3.8 07/22/2020   CL 96 07/22/2020   CO2 28 07/22/2020   GLUCOSE 99 07/22/2020   BUN 17 07/22/2020   CREATININE 1.18 07/22/2020   CALCIUM 9.1 07/22/2020   PROT 6.5 07/22/2020   ALBUMIN 4.1 07/22/2020   AST 22 07/22/2020   ALT 12 07/22/2020   ALKPHOS 41 (L) 07/22/2020   BILITOT 0.6 07/22/2020   GFRNONAA 57 (L) 07/22/2020   GFRAA 66 07/22/2020    Lab Results  Component Value Date   WBC 7.4 09/04/2020   NEUTROABS 3.5 09/04/2020   HGB 12.9 (L) 09/04/2020   HCT 38.4 (L) 09/04/2020   MCV 91.6 09/04/2020   PLT 184 09/04/2020   Lab Results  Component Value Date   TOTALPROTELP 6.8 09/04/2020   ALBUMINELP 3.8 09/04/2020   A1GS 0.2 09/04/2020   A2GS 0.5 09/04/2020   BETS 0.7 09/04/2020   GAMS 1.5 09/04/2020   MSPIKE 1.0 (H) 09/04/2020   SPEI Comment 09/04/2020     STUDIES: ECHOCARDIOGRAM COMPLETE  Result Date: 08/20/2020    ECHOCARDIOGRAM REPORT   Patient Name:   Daniel Carr Date of Exam: 08/20/2020 Medical Rec #:  [id number]     Height:       73.0 in Accession #:    [id number]    Weight:       212.0 lb Date of Birth:  1935-04-29      BSA:          2.206 m Patient Age:    85 years      BP:           102/64 mmHg Patient Gender: M             HR:           70 bpm. Exam Location:  [place] Procedure: 2D  Echo, Color Doppler and Cardiac Doppler Indications:    I35.0 Nonrheumatic aortic (valve) stenosis  History:        Patient has no prior history of Echocardiogram examinations.                 Aortic Valve Disease, Signs/Symptoms:Murmur, Dyspnea and Edema;  Risk Factors:Hypertension, Dyslipidemia and Former Smoker.  Sonographer:    Pilar Jarvis RDMS, RVT, RDCS Referring Phys: [id number] [name]  Sonographer Comments: Virtually no acoustic windows, from any scanning plane. Aortic valve meaurements are somewhat consistent but their accuracy is questionable IMPRESSIONS  1. Left ventricular ejection fraction, by estimation, is not assessed%. The left ventricle has not assessed function. Left ventricular endocardial border not optimally defined to evaluate regional wall motion. The left ventricular internal cavity size was not assessed. There is not assessed left ventricular hypertrophy. Left ventricular diastolic function could not be evaluated.  2. Right ventricular systolic function was not well visualized. The right ventricular size is not well visualized.  3. The mitral valve was not well visualized. not assessed mitral valve regurgitation.  4. Tricuspid valve regurgitation not assessed.  5. The aortic valve was not well visualized. Aortic valve regurgitation not assessed.  6. Pulmonic valve regurgitation not assessed.  7. The inferior vena cava is dilated in size with >50% respiratory variability, suggesting right atrial pressure of 8 mmHg. Conclusion(s)/Recommendation(s): Poor windows for evaluation of left ventricular function by transthoracic echocardiography. Would recommend an alternative means of evaluation. FINDINGS  Left Ventricle: Left ventricular ejection fraction, by estimation, is not assessed%. The left ventricle has not assessed function. Left ventricular endocardial border not optimally defined to evaluate regional wall motion. The left ventricular internal cavity size was not  assessed. There is not assessed left ventricular hypertrophy. Left ventricular diastolic function could not be evaluated. Right Ventricle: The right ventricular size is not well visualized. Right vetricular wall thickness was not assessed. Right ventricular systolic function was not well visualized. Left Atrium: Left atrial size was not well visualized. Right Atrium: Right atrial size was not well visualized. Pericardium: The pericardium was not assessed. Mitral Valve: The mitral valve was not well visualized. Not assessed mitral valve regurgitation. Tricuspid Valve: The tricuspid valve is not well visualized. Tricuspid valve regurgitation not assessed. Aortic Valve: The aortic valve was not well visualized. Aortic valve regurgitation not assessed. Aortic valve mean gradient measures 13.2 mmHg. Aortic valve peak gradient measures 21.2 mmHg. Pulmonic Valve: The pulmonic valve was not well visualized. Pulmonic valve regurgitation not assessed. Aorta: The aortic root was not well visualized. Venous: The inferior vena cava is dilated in size with greater than 50% respiratory variability, suggesting right atrial pressure of 8 mmHg. IAS/Shunts: The interatrial septum was not well visualized.   Diastology LV e' lateral:   4.24 cm/s LV E/e' lateral: 28.8 LV e' medial:    4.79 cm/s LV E/e' medial:  25.5  RIGHT VENTRICLE             IVC RV S prime:     12.00 cm/s  IVC diam: 2.30 cm AORTIC VALVE AV Vmax:           230.26 cm/s AV Vmean:          162.920 cm/s AV VTI:            0.448 m AV Peak Grad:      21.2 mmHg AV Mean Grad:      13.2 mmHg LVOT Vmax:         101.00 cm/s LVOT Vmean:        69.700 cm/s LVOT VTI:          0.208 m LVOT/AV VTI ratio: 0.46  AORTA Ao Arch diam: 2.5 cm MITRAL VALVE MV Area (PHT): 1.75 cm     SHUNTS MV Decel Time: 433 msec  Systemic VTI: 0.21 m MV E velocity: 122.00 cm/s MV A velocity: 163.00 cm/s MV E/A ratio:  0.75 Kate Sable MD Electronically signed by Kate Sable MD Signature  Date/Time: 08/20/2020/4:51:34 PM    Final     ASSESSMENT: MGUS  PLAN:    1. MGUS: Patient's M spike is trended up slightly to 1.0, but his immunoglobulins continue to be within normal limits.  He has a mildly elevated kappa free light chain at 27.6, but this is unchanged from previous.  He continues to have no evidence of endorgan damage.  He does not require bone marrow biopsy or metastatic bone survey at this time.  Patient is at low risk for progressing to overt multiple myeloma.  No intervention is needed at this time.  Return to clinic in 1 year with repeat laboratory work and video assisted telemedicine visit.  I spent a total of 20 minutes reviewing chart data, face-to-face evaluation with the patient, counseling and coordination of care as detailed above.   Patient expressed understanding and was in agreement with this plan. He also understands that He can call clinic at any time with any questions, concerns, or complaints.    Lloyd Huger, MD   09/13/2020 9:03 AM

## 2020-09-11 ENCOUNTER — Other Ambulatory Visit: Payer: Self-pay

## 2020-09-11 DIAGNOSIS — D472 Monoclonal gammopathy: Secondary | ICD-10-CM

## 2020-09-12 ENCOUNTER — Inpatient Hospital Stay (HOSPITAL_BASED_OUTPATIENT_CLINIC_OR_DEPARTMENT_OTHER): Payer: Medicare HMO | Admitting: Oncology

## 2020-09-12 ENCOUNTER — Other Ambulatory Visit: Payer: Self-pay

## 2020-09-12 ENCOUNTER — Encounter: Payer: Self-pay | Admitting: Oncology

## 2020-09-12 VITALS — BP 115/73 | HR 85 | Temp 98.6°F | Resp 20 | Wt 210.3 lb

## 2020-09-12 DIAGNOSIS — Z7982 Long term (current) use of aspirin: Secondary | ICD-10-CM | POA: Diagnosis not present

## 2020-09-12 DIAGNOSIS — D472 Monoclonal gammopathy: Secondary | ICD-10-CM | POA: Diagnosis not present

## 2020-09-12 DIAGNOSIS — Z79899 Other long term (current) drug therapy: Secondary | ICD-10-CM | POA: Diagnosis not present

## 2020-09-12 DIAGNOSIS — Z87891 Personal history of nicotine dependence: Secondary | ICD-10-CM | POA: Diagnosis not present

## 2020-09-12 NOTE — Progress Notes (Signed)
Patient here today for follow up. Reports no new concerns or pain. Denies having any questions.

## 2020-09-30 DIAGNOSIS — J301 Allergic rhinitis due to pollen: Secondary | ICD-10-CM | POA: Diagnosis not present

## 2020-09-30 DIAGNOSIS — J3089 Other allergic rhinitis: Secondary | ICD-10-CM | POA: Diagnosis not present

## 2020-10-01 ENCOUNTER — Other Ambulatory Visit: Payer: Self-pay

## 2020-10-01 ENCOUNTER — Ambulatory Visit (INDEPENDENT_AMBULATORY_CARE_PROVIDER_SITE_OTHER): Payer: Medicare HMO

## 2020-10-01 DIAGNOSIS — Z23 Encounter for immunization: Secondary | ICD-10-CM | POA: Diagnosis not present

## 2020-10-02 DIAGNOSIS — M1712 Unilateral primary osteoarthritis, left knee: Secondary | ICD-10-CM | POA: Diagnosis not present

## 2020-10-13 DIAGNOSIS — H401132 Primary open-angle glaucoma, bilateral, moderate stage: Secondary | ICD-10-CM | POA: Diagnosis not present

## 2020-11-03 ENCOUNTER — Telehealth: Payer: Self-pay | Admitting: *Deleted

## 2020-11-03 NOTE — Chronic Care Management (AMB) (Signed)
  Chronic Care Management   Note  11/03/2020 Name: Anees Vanecek MRN: [id number] DOB: [birth year]  Taji Barretto is a 85 y.o. year old male who is a primary care patient of [name], [name], MD. I reached out to Ignatius Specking by phone today in response to a referral sent by Mr. [employer] health plan.     Mr. Thielen was given information about Chronic Care Management services today including:  1. CCM service includes personalized support from designated clinical staff supervised by his physician, including individualized plan of care and coordination with other care providers 2. 24/7 contact phone numbers for assistance for urgent and routine care needs. 3. Service will only be billed when office clinical staff spend 20 minutes or more in a month to coordinate care. 4. Only one practitioner may furnish and bill the service in a calendar month. 5. The patient may stop CCM services at any time (effective at the end of the month) by phone call to the office staff. 6. The patient will be responsible for cost sharing (co-pay) of up to 20% of the service fee (after annual deductible is met).  Patient agreed to services and verbal consent obtained.   Follow up plan: Telephone appointment with care management team member scheduled for:11/13/2020  [place] [facility]  Direct Dial: [phone]

## 2020-11-05 DIAGNOSIS — H401132 Primary open-angle glaucoma, bilateral, moderate stage: Secondary | ICD-10-CM | POA: Diagnosis not present

## 2020-11-10 ENCOUNTER — Ambulatory Visit: Payer: Self-pay

## 2020-11-10 NOTE — Telephone Encounter (Signed)
Patient called and asks if the office gives the COVID booster. I advised yes. I connected him to Arbie Cookey in the office to schedule the appointment. Updated his immunization with [place] [place] on 01/16/20 and 02/09/20.  Reason for Disposition . General information question, no triage required and triager able to answer question  Answer Assessment - Initial Assessment Questions 1. REASON FOR CALL or QUESTION: "What is your reason for calling today?" or "How can I best help you?" or "What question do you have that I can help answer?"     I have questions about the COVID booster  Protocols used: [place]

## 2020-11-13 ENCOUNTER — Ambulatory Visit (INDEPENDENT_AMBULATORY_CARE_PROVIDER_SITE_OTHER): Payer: Medicare HMO

## 2020-11-13 DIAGNOSIS — I10 Essential (primary) hypertension: Secondary | ICD-10-CM | POA: Diagnosis not present

## 2020-11-13 DIAGNOSIS — E7800 Pure hypercholesterolemia, unspecified: Secondary | ICD-10-CM | POA: Diagnosis not present

## 2020-11-13 NOTE — Chronic Care Management (AMB) (Signed)
Chronic Care Management   Initial Visit Note  11/13/2020 Name: Daniel Carr MRN: [id number] DOB: 1935/09/05  Primary Care Provider: [name], MD Reason for referral : Chronic Care Management   Daniel Carr is a 85 y.o. year old male who is a primary care patient of [name], [name], MD. The CCM team was consulted for assistance with chronic disease management and care coordination. The initial assessment was conducted today.   Review of [name] status, including review of consultants reports, relevant labs and test results was conducted today. Collaboration with appropriate care team members was performed as part of the comprehensive evaluation and provision of chronic care management services.     SDOH (Social Determinants of Health) assessments performed: Yes  SDOH Interventions     Most Recent Value  SDOH Interventions  Food Insecurity Interventions Intervention Not Indicated  Transportation Interventions Intervention Not Indicated         Medications: Outpatient Encounter Medications as of 11/13/2020  Medication Sig  . amLODipine (NORVASC) 2.5 MG tablet Take 1 tablet by mouth once daily  . aspirin 81 MG tablet Take 81 mg by mouth daily.  . Azelastine HCl 137 MCG/SPRAY SOLN SMARTSIG:1-2 Spray(s) Both Nares Twice Daily  . brimonidine (ALPHAGAN) 0.2 % ophthalmic solution   . dutasteride (AVODART) 0.5 MG capsule Take 1 capsule by mouth once daily  . hydrochlorothiazide (HYDRODIURIL) 25 MG tablet Take 0.5 tablets (12.5 mg total) by mouth daily.  [name] ipratropium (ATROVENT) 0.06 % nasal spray   . Lipoic Acid 150 MG CAPS Take by mouth.  . lovastatin (MEVACOR) 40 MG tablet Take 1 tablet by mouth once daily  . Multiple Vitamin (DAILY VITAMIN PO) Take by mouth.  . SYNTHROID 125 MCG tablet Take 1 tablet (125 mcg total) by mouth daily.  . furosemide (LASIX) 20 MG tablet Take 1 tablet (20 mg total) by mouth daily. (Patient not taking: Reported on 11/13/2020)  . montelukast  (SINGULAIR) 10 MG tablet Take 1 tablet (10 mg total) by mouth at bedtime.  Dorette Grate Z 0.004 % SOLN ophthalmic solution  (Patient not taking: Reported on 11/13/2020)   No facility-administered encounter medications on file as of 11/13/2020.     Objective:  BP Readings from Last 3 Encounters:  09/12/20 115/73  08/25/20 (!) 148/73  07/22/20 (!) 108/60   Lab Results  Component Value Date   CHOL 149 07/22/2020   HDL 68 07/22/2020   LDLCALC 64 07/22/2020   TRIG 89 07/22/2020   CHOLHDL 2.2 07/22/2020    Goals Addressed            This Visit's Progress   . Chronic Disease Management       CARE PLAN ENTRY (see longitudinal plan of care for additional care plan information)  Current Barriers:  . Chronic Disease Management support and education needs related to Hypertension, Chronic Bronchitis, Hypercholesterolemia and Left Knee Osteoarthritis.  Case Manager Clinical Goal(s):  Over the next 120 days, patient: . Will not require hospitalization or emergent care d/t complications r/t chronic illnesses.  . Will attend all scheduled medical appointments. . Will take all medications as prescribed. . Will monitor blood pressure and record readings. . Will follow safety recommendations to prevents falls and injuries. Over the next 60 days, patient will: . Follow up with [facility] regarding right foot. [name] Update the chronic care management team with needs r/t pending knee surgery.   Interventions:  . Inter-disciplinary care team collaboration (see longitudinal plan of care) .  Reviewed medications and discussed indications for use. Encouraged to continue taking medications as prescribed. Advised to notify provider if unable to tolerate prescribed regimen. Encouraged to notify the care management team with concerns regarding prescription costs.  . Provided information regarding hypertension management and established BP parameters. Discussed indications for notifying a  provider. Encouraged to monitor a few times a week if unable to monitor daily and record readings. Reports doing well with a heart healthy diet and taking BP medications as prescribed. Reports home systolic readings have ranged from 109 to 143. Reports diastolic readings in the mid 60's to 80's. BP today was 127/71.  [name] Discussed activity tolerance. Discussed safety and fall prevention measures. Encouraged to continue using a cane when ambulating to prevent accidental falls. Encouraged to keep pathways clear and well lit. Denies recent falls. He noticed a corn and calloused area to his right foot. He reports being evaluated by [facility] in the past and would like to follow-up. He is also being followed by the Orthopedic team for left knee pain and currently on a wait list for surgery. Anticipates completing the procedure within the next two months.   . Reviewed pending appointments. Encouraged to attend medical appointments as scheduled to prevent delays in care. Encouraged to notify the care management team with concerns regarding transportation.  . Discussed plans for ongoing care management and follow up. Provided direct contact information. Daniel Carr reports doing well. He is independent in the home and serves as the primary caregiver for his spouse. His daughter [name] is also available to assist. Denies current need for additional assistance in the home. No urgent care management needs. Agreeable to outreach later this month to discuss pending surgery.     Patient Self Care Activities:  . Self administers medications . Attends scheduled provider appointments . Performs ADL's independently . Calls provider office for new concerns or questions   Initial goal documentation          Daniel Carr was given information about Chronic Care Management services including:  1. CCM service includes personalized support from designated clinical staff supervised by his physician,  including individualized plan of care and coordination with other care providers 2. 24/7 contact phone numbers for assistance for urgent and routine care needs. 3. Service will only be billed when office clinical staff spend 20 minutes or more in a month to coordinate care. 4. Only one practitioner may furnish and bill the service in a calendar month. 5. The patient may stop CCM services at any time (effective at the end of the month) by phone call to the office staff. 6. The patient will be responsible for cost sharing (co-pay) of up to 20% of the service fee (after annual deductible is met).  Patient agreed to services and verbal consent obtained.     PLAN A member of the care management team will follow-up with Mr. Hippert within the next few weeks.    [facility] Care Management [facility] [phone]

## 2020-11-17 DIAGNOSIS — Z0100 Encounter for examination of eyes and vision without abnormal findings: Secondary | ICD-10-CM | POA: Diagnosis not present

## 2020-11-19 ENCOUNTER — Other Ambulatory Visit: Payer: Self-pay | Admitting: Family Medicine

## 2020-11-19 DIAGNOSIS — I10 Essential (primary) hypertension: Secondary | ICD-10-CM

## 2020-11-25 ENCOUNTER — Ambulatory Visit: Payer: Self-pay

## 2020-11-26 NOTE — Patient Instructions (Addendum)
Thank you for allowing the Chronic Care Management team to participate in your care.  Goals Addressed            This Visit's Progress   . Chronic Disease Management       CARE PLAN ENTRY (see longitudinal plan of care for additional care plan information)  Current Barriers:  . Chronic Disease Management support and education needs related to Hypertension, Chronic Bronchitis, Hypercholesterolemia and Left Knee Osteoarthritis.  Case Manager Clinical Goal(s):  Over the next 120 days, patient: . Will not require hospitalization or emergent care d/t complications r/t chronic illnesses.  . Will attend all scheduled medical appointments. . Will take all medications as prescribed. . Will monitor blood pressure and record readings. . Will follow safety recommendations to prevents falls and injuries. Over the next 60 days, patient will: . Follow up with [facility] regarding right foot. [name] Update the chronic care management team with needs r/t pending knee surgery.   Interventions:  . Inter-disciplinary care team collaboration (see longitudinal plan of care) . Reviewed medications and discussed indications for use. Encouraged to continue taking medications as prescribed. Advised to notify provider if unable to tolerate prescribed regimen. Encouraged to notify the care management team with concerns regarding prescription costs.  . Provided information regarding hypertension management and established BP parameters. Discussed indications for notifying a provider. Encouraged to monitor a few times a week if unable to monitor daily and record readings. Reports doing well with a heart healthy diet and taking BP medications as prescribed. Reports home systolic readings have ranged from 109 to 143. Reports diastolic readings in the mid 60's to 80's. BP today was 127/71.  [name] Discussed activity tolerance. Discussed safety and fall prevention measures. Encouraged to continue using a cane when  ambulating to prevent accidental falls. Encouraged to keep pathways clear and well lit. Denies recent falls. He noticed a corn and calloused area to his right foot. He reports being evaluated by [facility] in the past and would like to follow-up. He is also being followed by the Orthopedic team for left knee pain and currently on a wait list for surgery. Anticipates completing the procedure within the next two months.   . Reviewed pending appointments. Encouraged to attend medical appointments as scheduled to prevent delays in care. Encouraged to notify the care management team with concerns regarding transportation.  . Discussed plans for ongoing care management and follow up. Provided direct contact information. Mr. Lucci reports doing well. He is independent in the home and serves as the primary caregiver for his spouse. His daughter [name] is also available to assist. Denies current need for additional assistance in the home. No urgent care management needs. Agreeable to outreach later this month to discuss pending surgery.     Patient Self Care Activities:  . Self administers medications . Attends scheduled provider appointments . Performs ADL's independently . Calls provider office for new concerns or questions   Initial goal documentation        Mr. Cecchi was given information about Chronic Care Management services including:  1. CCM service includes personalized support from designated clinical staff supervised by his physician, including individualized plan of care and coordination with other care providers 2. 24/7 contact phone numbers for assistance for urgent and routine care needs. 3. Service will only be billed when office clinical staff spend 20 minutes or more in a month to coordinate care. 4. Only one practitioner may furnish and bill the  service in a calendar month. 5. The patient may stop CCM services at any time (effective at the end of the month) by phone  call to the office staff. 6. The patient will be responsible for cost sharing (co-pay) of up to 20% of the service fee (after annual deductible is met).  Patient agreed to services and verbal consent obtained.    Mr. Barich verbalized understanding of the information discussed during the telephonic outreach today. Declined need for a mailed printed copy of the instructions.   A member of the care management team will follow-up with Mr. Sevin within the next few weeks.    [facility] Care Management [facility] [phone]

## 2020-12-07 NOTE — Discharge Instructions (Signed)
Instructions after Total Knee Replacement   Monti Jilek P. Crockett Rallo, Jr., M.D.     Dept. of Orthopaedics & Sports Medicine  [facility]  [address]  [place], [place]  [address]  Phone: [phone]   Fax: [phone]    DIET: Drink plenty of non-alcoholic fluids. Resume your normal diet. Include foods high in fiber.  ACTIVITY:  You may use crutches or a walker with weight-bearing as tolerated, unless instructed otherwise. You may be weaned off of the walker or crutches by your Physical Therapist.  Do NOT place pillows under the knee. Anything placed under the knee could limit your ability to straighten the knee.   Continue doing gentle exercises. Exercising will reduce the pain and swelling, increase motion, and prevent muscle weakness.   Please continue to use the TED compression stockings for 6 weeks. You may remove the stockings at night, but should reapply them in the morning. Do not drive or operate any equipment until instructed.  WOUND CARE:  Continue to use the PolarCare or ice packs periodically to reduce pain and swelling. You may bathe or shower after the staples are removed at the first office visit following surgery.  MEDICATIONS: You may resume your regular medications. Please take the pain medication as prescribed on the medication. Do not take pain medication on an empty stomach. You have been given a prescription for a blood thinner (Lovenox or Coumadin). Please take the medication as instructed. (NOTE: After completing a 2 week course of Lovenox, take one Enteric-coated aspirin once a day. This along with elevation will help reduce the possibility of phlebitis in your operated leg.) Do not drive or drink alcoholic beverages when taking pain medications.  CALL THE OFFICE FOR: Temperature above 101 degrees Excessive bleeding or drainage on the dressing. Excessive swelling, coldness, or paleness of the toes. Persistent nausea and vomiting.  FOLLOW-UP:  You  should have an appointment to return to the office in 10-14 days after surgery. Arrangements have been made for continuation of Physical Therapy (either home therapy or outpatient therapy).   [facility] Department Directory         [email or web address]       [email or web address]          Cardiology  Appointments: [place] - [phone] [place] - [phone]  Endocrinology  Appointments: [place] - [phone] [place] - [phone]  Gastroenterology  Appointments: [place] - [phone] [place] - [phone]        General Surgery   Appointments: [place] - [phone]  Internal Medicine/Family Medicine  Appointments: [place] - [phone] [place] - [phone] [place] - [phone]  Metabolic and Weigh Loss Surgery  Appointments: [place] - [phone]        Neurology  Appointments: [place] - [phone] [place] - [phone]  Neurosurgery  Appointments: [place] - [phone]  Obstetrics & Gynecology  Appointments: [place] - [phone] [place] - [phone]        Pediatrics  Appointments: [place] - [phone] [place] - [phone]  Physiatry  Appointments: [place] -[phone]  Physical Therapy  Appointments: [place] - [phone] [place] - [phone]        Podiatry  Appointments: [place] - [phone] [place] - [phone]  Pulmonology  Appointments: [place] - [phone]  Rheumatology  Appointments: [place] - [phone]         Location: [facility]  [address] , [place]  [address]  [place] Location: [facility] [address] [place], [place]  [address]  [place] Location: [facility] [address] [place], [place]  [address]    

## 2020-12-16 ENCOUNTER — Other Ambulatory Visit: Payer: Self-pay

## 2020-12-16 ENCOUNTER — Encounter
Admission: RE | Admit: 2020-12-16 | Discharge: 2020-12-16 | Disposition: A | Discharge status: Home / Self Care | Payer: Medicare HMO | Source: Ambulatory Visit | Attending: Orthopedic Surgery | Admitting: Orthopedic Surgery

## 2020-12-16 DIAGNOSIS — Z01810 Encounter for preprocedural cardiovascular examination: Secondary | ICD-10-CM | POA: Diagnosis not present

## 2020-12-16 DIAGNOSIS — Z01812 Encounter for preprocedural laboratory examination: Secondary | ICD-10-CM | POA: Diagnosis not present

## 2020-12-16 HISTORY — DX: Unspecified osteoarthritis, unspecified site: M19.90

## 2020-12-16 HISTORY — DX: Hypothyroidism, unspecified: E03.9

## 2020-12-16 LAB — SEDIMENTATION RATE: Sed Rate: 12 mm/hr (ref 0–20)

## 2020-12-16 LAB — COMPREHENSIVE METABOLIC PANEL
ALT: 14 U/L (ref 0–44)
AST: 23 U/L (ref 15–41)
Albumin: 4 g/dL (ref 3.5–5.0)
Alkaline Phosphatase: 34 U/L — ABNORMAL LOW (ref 38–126)
Anion gap: 7 (ref 5–15)
BUN: 15 mg/dL (ref 8–23)
CO2: 32 mmol/L (ref 22–32)
Calcium: 9.3 mg/dL (ref 8.9–10.3)
Chloride: 96 mmol/L — ABNORMAL LOW (ref 98–111)
Creatinine, Ser: 1 mg/dL (ref 0.61–1.24)
GFR, Estimated: >60 mL/min (ref >60)
Glucose, Bld: 98 mg/dL (ref 70–99)
Potassium: 3.7 mmol/L (ref 3.5–5.1)
Sodium: 135 mmol/L (ref 135–145)
Total Bilirubin: 0.8 mg/dL (ref 0.3–1.2)
Total Protein: 7.2 g/dL (ref 6.5–8.1)

## 2020-12-16 LAB — CBC
HCT: 38 % — ABNORMAL LOW (ref 39.0–52.0)
Hemoglobin: 12.4 g/dL — ABNORMAL LOW (ref 13.0–17.0)
MCH: 30.5 pg (ref 26.0–34.0)
MCHC: 32.6 g/dL (ref 30.0–36.0)
MCV: 93.4 fL (ref 80.0–100.0)
Platelets: 191 10*3/uL (ref 150–400)
RBC: 4.07 MIL/uL — ABNORMAL LOW (ref 4.22–5.81)
RDW: 12.9 % (ref 11.5–15.5)
WBC: 5.4 10*3/uL (ref 4.0–10.5)
nRBC: 0 % (ref 0.0–0.2)

## 2020-12-16 LAB — URINALYSIS, ROUTINE W REFLEX MICROSCOPIC
Bilirubin Urine: NEGATIVE
Glucose, UA: NEGATIVE mg/dL
Hgb urine dipstick: NEGATIVE
Ketones, ur: NEGATIVE mg/dL
Leukocytes,Ua: NEGATIVE
Nitrite: NEGATIVE
Protein, ur: NEGATIVE mg/dL
Specific Gravity, Urine: 1.01 (ref 1.005–1.030)
pH: 8 (ref 5.0–8.0)

## 2020-12-16 LAB — TYPE AND SCREEN
ABO/RH(D): O POS
Antibody Screen: NEGATIVE

## 2020-12-16 LAB — APTT: aPTT: 34 seconds (ref 24–36)

## 2020-12-16 LAB — PROTIME-INR
INR: 1 (ref 0.8–1.2)
Prothrombin Time: 12.6 seconds (ref 11.4–15.2)

## 2020-12-16 LAB — C-REACTIVE PROTEIN: CRP: 1 mg/dL — ABNORMAL HIGH (ref <1.0)

## 2020-12-16 LAB — SURGICAL PCR SCREEN
MRSA, PCR: NEGATIVE
Staphylococcus aureus: NEGATIVE

## 2020-12-16 NOTE — Patient Instructions (Signed)
Your procedure is scheduled on: Wed. 12/29 Report to registration. 2nd floor To find out your arrival time please call [phone] between 1PM - 3PM on Tues 12/28  Remember: Instructions that are not followed completely may result in serious medical risk,  up to and including death, or upon the discretion of your surgeon and anesthesiologist your  surgery may need to be rescheduled.     _X__ 1. Do not eat food after midnight the night before your procedure.                 No chewing gum or hard candies. You may drink clear liquids up to 2 hours                 before you are scheduled to arrive for your surgery- DO not drink clear                 liquids within 2 hours of the start of your surgery.                 Clear Liquids include:  water, apple juice without pulp, clear Gatorade, G2 or                  Gatorade Zero (avoid Red/Purple/Blue), Black Coffee or Tea (Do not add                 anything to coffee or tea). __x___2.   Complete the "Ensure Clear Pre-surgery Clear Carbohydrate Drink" provided to you, 2 hours before arrival.   __X__2.  On the morning of surgery brush your teeth with toothpaste and water, you                may rinse your mouth with mouthwash if you wish.  Do not swallow any toothpaste of mouthwash.     ___ 3.  No Alcohol for 24 hours before or after surgery.   ___ 4.  Do Not Smoke or use e-cigarettes For 24 Hours Prior to Your Surgery.                 Do not use any chewable tobacco products for at least 6 hours prior to                 Surgery.  __  5.  Do not use any recreational drugs (marijuana, cocaine, heroin, ecstasy, MDMA or other)                For at least one week prior to your surgery.  Combination of these drugs with anesthesia                May have life threatening results.  ____  6.  Bring all medications with you on the day of surgery if instructed.   __x__  7.  Notify your doctor if there is any change in your  medical condition      (cold, fever, infections).     Do not wear jewelry,  Do not wear lotions,  You may wear deodorant. Do not shave 48 hours prior to surgery. Men may shave face and neck. Do not bring valuables to the hospital.    [facility] is not responsible for any belongings or valuables.  Contacts, dentures or bridgework may not be worn into surgery. Leave your suitcase in the car. After surgery it may be brought to your room. For patients admitted to the hospital, discharge time is determined by your treatment team.  Patients discharged the day of surgery will not be allowed to drive home.   Make arrangements for someone to be with you for the first 24 hours of your Same Day Discharge.    Please read over the following fact sheets that you were given:   Incentive spirometery    _x___ Take these medicines the morning of surgery with A SIP OF WATER:    1. amLODipine (NORVASC) 2.5 MG tablet  2. azelastine (ASTELIN) 0.1 % nasal spray if needed  3. brimonidine (ALPHAGAN) 0.2 % ophthalmic solution  4.ipratropium (ATROVENT) 0.06 % nasal spray if needed  5.lovastatin (MEVACOR) 40 MG tablet  6.SYNTHROID 125 MCG tablet  ____ Fleet Enema (as directed)   __x__ Use CHG Soap (or wipes) as directed  ____ Use Benzoyl Peroxide Gel as instructed  ____ Use inhalers on the day of surgery  ____ Stop metformin 2 days prior to surgery    ____ Take 1/2 of usual insulin dose the night before surgery. No insulin the morning          of surgery.   __x__ Stop aspirin on tomorrow  __x__ Stop Anti-inflammatories on aleve ibuprofen or aspirin products tomorrow   __x__ Stop supplements until after surgery.  COD LIVER OIL PO tomorrow  ____ Bring C-Pap to the hospital.    If you have any questions regarding your pre-procedure instructions,  Please call Pre-admit Testing at [place]

## 2020-12-17 LAB — URINE CULTURE
Culture: NO GROWTH
Special Requests: NORMAL

## 2020-12-21 NOTE — H&P (Signed)
ORTHOPAEDIC HISTORY & PHYSICAL Progress Notes Daniel Carr, [place] - 12/16/2020 2:30 PM EST [facility] Chief Complaint:   Chief Complaint  Patient presents with  . Knee Pain  H & P LEFT KNEE   History of Present Illness:   Daniel Carr is a 85 y.o. male that presents to clinic today for his preoperative history and evaluation. Patient presents unaccompanied. The patient is scheduled to undergo a left total knee arthroplasty on 12/24/20 by Dr. Marry Guan. His pain began many years ago. The pain is located along the medial and lateral aspects of the knee. He describes his pain as worse with weightbearing. He reports associated swelling with significant giving way of the knee. He denies associated numbness or tingling.   The patient's symptoms have progressed to the point that they decrease his quality of life. The patient has previously undergone conservative treatment including NSAIDS and injections to the knee without adequate control of his symptoms.  Denies significant cardiac history, history of blood clots, or previous lumbar surgery. No significant drug allergies.   Past Medical, Surgical, Family, Social History, Allergies, Medications:   Past Medical History:  Past Medical History:  Diagnosis Date  . Detached retina  . Glaucoma (increased eye pressure)  . Hx of eye surgery  . Hyperlipidemia  . Hypertension  . Osteoarthritis  . Thyroid disease   Past Surgical History:  Past Surgical History:  Procedure Laterality Date  . APPENDECTOMY  . COLONOSCOPY 08/05/2010  Dr Hulen Luster [facility] total knee replace on the right 10/04/2011  Dr Hardie Lora Jefm Bryant   Current Medications:  Current Outpatient Medications  Medication Sig Dispense Refill  . alpha lipoic acid 600 mg Cap capsule Take 600 mg by mouth once daily  . amLODIPine (NORVASC) 2.5 MG tablet Take 2.5 mg by mouth once daily  . aspirin 81 MG EC tablet Take 81 mg by mouth  once daily  . azelastine (ASTELIN) 137 mcg nasal spray Place 1 spray into both nostrils 2 (two) times daily as needed  . brimonidine (ALPHAGAN) 0.2 % ophthalmic solution Place 1 drop into both eyes 2 (two) times daily  . cod liver oil Oil Take 1 capsule by mouth once daily  . dutasteride (AVODART) 0.5 mg capsule Take 0.5 mg by mouth once daily 3  . finasteride (PROPECIA) 1 mg tablet 1 mg once daily  . hydroCHLOROthiazide (HYDRODIURIL) 25 MG tablet Take 12.5 mg by mouth once daily  . ipratropium (ATROVENT) 0.06 % nasal spray Place 2 sprays into both nostrils 2 (two) times daily as needed  . levocetirizine (XYZAL) 5 MG tablet 5 mg every evening  . levothyroxine (SYNTHROID) 125 MCG tablet Take 125 mcg by mouth once daily  . lovastatin (MEVACOR) 40 MG tablet Take 40 mg by mouth daily with dinner  . LUMIGAN 0.01 % ophthalmic solution Place 1 drop into both eyes nightly  . multivitamin tablet Take 1 tablet by mouth once daily  . amLODIPine (NORVASC) 10 MG tablet Take 10 mg by mouth once daily  . aspirin-calcium carbonate 81 mg-300 mg calcium(777 mg) Tab Take 1 tablet by mouth once daily  . FUROsemide (LASIX) 20 MG tablet Take 20 mg by mouth once daily Take 1 tablet (20 mg total) by mouth daily.  . travoprost (TRAVATAN Z) 0.004 % ophthalmic solution Place 1 drop into both eyes nightly   No current facility-administered medications for this visit.   Allergies: No Known Allergies  Social History:  Social History   Socioeconomic History  . Marital status: Married  Spouse name: Izora Gala  . Number of children: 1  . Years of education: 13  . Highest education level: Not on file  Occupational History  . Occupation: Retired  Tobacco Use  . Smoking status: Former Smoker  Packs/day: 0.25  Years: 5.00  Pack years: 1.25  Types: Cigarettes  Quit date: 1970  Years since quitting: 52.0  . Smokeless tobacco: Never Used  Vaping Use  . Vaping Use: Never used  Substance and Sexual Activity  . Alcohol  use: No  . Drug use: Not Currently  . Sexual activity: Defer  Partners: Female  Other Topics Concern  . Not on file  Social History Narrative  . Not on file   Social Determinants of Health   Financial Resource Strain: Not on file  Food Insecurity: Not on file  Transportation Needs: Not on file  Physical Activity: Not on file  Stress: Not on file  Social Connections: Not on file  Housing Stability: Not on file   Family History:  Family History  Problem Relation Age of Onset  . Arthritis Sister  . Other Brother   Review of Systems:   A 10+ ROS was performed, reviewed, and the pertinent orthopaedic findings are documented in the HPI.   Physical Examination:   BP 130/80  Ht 185.4 cm (6\' 1" )  Wt 95.1 kg (209 lb 9.6 oz)  BMI 27.65 kg/m   Patient is a well-developed, well-nourished male in no acute distress. Patient has normal mood and affect. Patient is alert and oriented to person, place, and time.   HEENT: Atraumatic, normocephalic. Pupils equal and reactive to light. Extraocular motion intact. Noninjected sclera.  Cardiovascular: Regular rate and rhythm, with no murmurs, rubs, or gallops. Distal pulses palpable.  Respiratory: Lungs clear to auscultation bilaterally.   Left Knee: Soft tissue swelling: mild Effusion: mild Erythema: none Crepitance: moderate Tenderness: medial, lateral, patellar Alignment: relative valgus Mediolateral laxity: lateral pseudolaxity Posterior sag: negative Patellar tracking: Good tracking without evidence of subluxation or tilt. Patellar grind is positive. Atrophy: Generalized quadriceps atrophy.  Quadriceps tone was fair. Range of motion: 0/3/115 degrees  Sensation intact over the saphenous, lateral sural cutaneous, superficial fibular, and deep fibular nerve distributions.  Tests Performed/Reviewed:  X-rays  No new radiographs were obtained today. Previous radiographs were reviewed of the left knee and revealed complete loss  of medial and lateral joint spaces with mild osteophyte formation. Complete loss of patellofemoral joint space with bone-on-bone contact is also noted. No fractures or dislocations.  Impression:   ICD-10-CM  1. Primary osteoarthritis of left knee M17.12   Plan:   The patient has end-stage degenerative changes of the left knee. It was explained to the patient that the condition is progressive in nature. Having failed conservative treatment, the patient has elected to proceed with a total joint arthroplasty. The patient will undergo a total joint arthroplasty with Dr. Marry Guan. The risks of surgery, including blood clot and infection, were discussed with the patient. Measures to reduce these risks, including the use of anticoagulation, perioperative antibiotics, and early ambulation were discussed. The importance of postoperative physical therapy was discussed with the patient. The patient elects to proceed with surgery. The patient is instructed to stop all blood thinners prior to surgery. The patient is instructed to call the hospital the day before surgery to learn of the proper arrival time.   Contact our office with any questions or concerns. Follow up as indicated,  or sooner should any new problems arise, if conditions worsen, or if they are otherwise concerned.   Daniel Fudge, PA-C [facility] [place], [place] [address] Phone: [phone]  This note was generated in part with voice recognition software and I apologize for any typographical errors that were not detected and corrected.   Electronically signed by Daniel Fudge, PA at 12/19/2020 4:01 PM EST

## 2020-12-22 ENCOUNTER — Other Ambulatory Visit: Payer: Self-pay

## 2020-12-22 ENCOUNTER — Other Ambulatory Visit
Admission: RE | Admit: 2020-12-22 | Discharge: 2020-12-22 | Disposition: A | Discharge status: Home / Self Care | Payer: Medicare HMO | Source: Ambulatory Visit | Attending: Orthopedic Surgery | Admitting: Orthopedic Surgery

## 2020-12-22 DIAGNOSIS — E7800 Pure hypercholesterolemia, unspecified: Secondary | ICD-10-CM | POA: Diagnosis not present

## 2020-12-22 DIAGNOSIS — Z79899 Other long term (current) drug therapy: Secondary | ICD-10-CM | POA: Diagnosis not present

## 2020-12-22 DIAGNOSIS — E785 Hyperlipidemia, unspecified: Secondary | ICD-10-CM | POA: Diagnosis present

## 2020-12-22 DIAGNOSIS — Z79890 Hormone replacement therapy (postmenopausal): Secondary | ICD-10-CM | POA: Diagnosis not present

## 2020-12-22 DIAGNOSIS — Z96659 Presence of unspecified artificial knee joint: Secondary | ICD-10-CM | POA: Diagnosis not present

## 2020-12-22 DIAGNOSIS — Z7982 Long term (current) use of aspirin: Secondary | ICD-10-CM | POA: Diagnosis not present

## 2020-12-22 DIAGNOSIS — Z20822 Contact with and (suspected) exposure to covid-19: Secondary | ICD-10-CM | POA: Insufficient documentation

## 2020-12-22 DIAGNOSIS — Z01812 Encounter for preprocedural laboratory examination: Secondary | ICD-10-CM | POA: Insufficient documentation

## 2020-12-22 DIAGNOSIS — Z471 Aftercare following joint replacement surgery: Secondary | ICD-10-CM | POA: Diagnosis not present

## 2020-12-22 DIAGNOSIS — Z87891 Personal history of nicotine dependence: Secondary | ICD-10-CM | POA: Diagnosis not present

## 2020-12-22 DIAGNOSIS — I10 Essential (primary) hypertension: Secondary | ICD-10-CM | POA: Diagnosis present

## 2020-12-22 DIAGNOSIS — Z8261 Family history of arthritis: Secondary | ICD-10-CM | POA: Diagnosis not present

## 2020-12-22 DIAGNOSIS — Z96642 Presence of left artificial hip joint: Secondary | ICD-10-CM | POA: Diagnosis not present

## 2020-12-22 DIAGNOSIS — M1712 Unilateral primary osteoarthritis, left knee: Secondary | ICD-10-CM | POA: Diagnosis present

## 2020-12-22 DIAGNOSIS — E039 Hypothyroidism, unspecified: Secondary | ICD-10-CM | POA: Diagnosis present

## 2020-12-22 DIAGNOSIS — Z96652 Presence of left artificial knee joint: Secondary | ICD-10-CM | POA: Diagnosis not present

## 2020-12-22 DIAGNOSIS — M25762 Osteophyte, left knee: Secondary | ICD-10-CM | POA: Diagnosis present

## 2020-12-22 DIAGNOSIS — G5623 Lesion of ulnar nerve, bilateral upper limbs: Secondary | ICD-10-CM | POA: Diagnosis present

## 2020-12-23 ENCOUNTER — Encounter: Payer: Self-pay | Admitting: Orthopedic Surgery

## 2020-12-23 LAB — SARS CORONAVIRUS 2 (TAT 6-24 HRS): SARS Coronavirus 2: NEGATIVE

## 2020-12-24 ENCOUNTER — Inpatient Hospital Stay
Admission: RE | Admit: 2020-12-24 | Discharge: 2020-12-29 | DRG: 470 | Disposition: A | Discharge status: Skilled Nursing Facility | Payer: Medicare HMO | Attending: Orthopedic Surgery | Admitting: Orthopedic Surgery

## 2020-12-24 ENCOUNTER — Inpatient Hospital Stay: Payer: Medicare HMO | Admitting: Anesthesiology

## 2020-12-24 ENCOUNTER — Inpatient Hospital Stay: Payer: Medicare HMO

## 2020-12-24 ENCOUNTER — Inpatient Hospital Stay: Payer: Medicare HMO | Admitting: Urgent Care

## 2020-12-24 ENCOUNTER — Encounter: Payer: Self-pay | Admitting: Orthopedic Surgery

## 2020-12-24 ENCOUNTER — Other Ambulatory Visit: Payer: Self-pay

## 2020-12-24 ENCOUNTER — Encounter
Admission: RE | Disposition: A | Discharge status: Skilled Nursing Facility | Payer: Self-pay | Source: Home / Self Care | Attending: Orthopedic Surgery

## 2020-12-24 DIAGNOSIS — Z79899 Other long term (current) drug therapy: Secondary | ICD-10-CM

## 2020-12-24 DIAGNOSIS — G5623 Lesion of ulnar nerve, bilateral upper limbs: Secondary | ICD-10-CM | POA: Diagnosis present

## 2020-12-24 DIAGNOSIS — E785 Hyperlipidemia, unspecified: Secondary | ICD-10-CM | POA: Diagnosis present

## 2020-12-24 DIAGNOSIS — Z87891 Personal history of nicotine dependence: Secondary | ICD-10-CM | POA: Diagnosis not present

## 2020-12-24 DIAGNOSIS — M1712 Unilateral primary osteoarthritis, left knee: Secondary | ICD-10-CM | POA: Diagnosis present

## 2020-12-24 DIAGNOSIS — M25762 Osteophyte, left knee: Secondary | ICD-10-CM | POA: Diagnosis present

## 2020-12-24 DIAGNOSIS — E039 Hypothyroidism, unspecified: Secondary | ICD-10-CM | POA: Diagnosis present

## 2020-12-24 DIAGNOSIS — Z96659 Presence of unspecified artificial knee joint: Secondary | ICD-10-CM

## 2020-12-24 DIAGNOSIS — Z79890 Hormone replacement therapy (postmenopausal): Secondary | ICD-10-CM

## 2020-12-24 DIAGNOSIS — Z7982 Long term (current) use of aspirin: Secondary | ICD-10-CM | POA: Diagnosis not present

## 2020-12-24 DIAGNOSIS — Z20822 Contact with and (suspected) exposure to covid-19: Secondary | ICD-10-CM | POA: Diagnosis present

## 2020-12-24 DIAGNOSIS — I10 Essential (primary) hypertension: Secondary | ICD-10-CM | POA: Diagnosis present

## 2020-12-24 DIAGNOSIS — Z8261 Family history of arthritis: Secondary | ICD-10-CM | POA: Diagnosis not present

## 2020-12-24 HISTORY — PX: KNEE ARTHROPLASTY: SHX992

## 2020-12-24 LAB — ABO/RH: ABO/RH(D): O POS

## 2020-12-24 SURGERY — ARTHROPLASTY, KNEE, TOTAL, USING IMAGELESS COMPUTER-ASSISTED NAVIGATION
Anesthesia: Spinal | Site: Knee | Laterality: Left

## 2020-12-24 MED ORDER — LEVOCETIRIZINE DIHYDROCHLORIDE 5 MG PO TABS: 5.0000 mg | ORAL_TABLET | Freq: Every evening | ORAL | Status: DC

## 2020-12-24 MED ORDER — TRANEXAMIC ACID-NACL 1000-0.7 MG/100ML-% IV SOLN
INTRAVENOUS | Status: AC
  Filled 2020-12-24: qty 100

## 2020-12-24 MED ORDER — CEFAZOLIN SODIUM-DEXTROSE 2-4 GM/100ML-% IV SOLN
INTRAVENOUS | Status: AC
  Filled 2020-12-24: qty 100

## 2020-12-24 MED ORDER — IPRATROPIUM BROMIDE 0.06 % NA SOLN
1.0000 | Freq: Two times a day (BID) | NASAL | Status: DC | PRN
  Filled 2020-12-24: qty 15

## 2020-12-24 MED ORDER — SODIUM CHLORIDE 0.9 % IV SOLN
INTRAVENOUS | Status: DC | PRN
  Administered 2020-12-24: 50 ug/min via INTRAVENOUS

## 2020-12-24 MED ORDER — FLEET ENEMA 7-19 GM/118ML RE ENEM: 1.0000 | ENEMA | Freq: Once | RECTAL | Status: DC | PRN

## 2020-12-24 MED ORDER — PHENOL 1.4 % MT LIQD
1.0000 | OROMUCOSAL | Status: DC | PRN
  Filled 2020-12-24: qty 177

## 2020-12-24 MED ORDER — FENTANYL CITRATE (PF) 100 MCG/2ML IJ SOLN: 25.0000 ug | INTRAMUSCULAR | Status: DC | PRN

## 2020-12-24 MED ORDER — TRANEXAMIC ACID-NACL 1000-0.7 MG/100ML-% IV SOLN
1000.0000 mg | Freq: Once | INTRAVENOUS | Status: AC
  Administered 2020-12-24: 1000 mg via INTRAVENOUS

## 2020-12-24 MED ORDER — SODIUM CHLORIDE 0.9 % IR SOLN
Status: DC | PRN
  Administered 2020-12-24: 1004 mL
  Administered 2020-12-24: 3012 mL

## 2020-12-24 MED ORDER — BUPIVACAINE HCL (PF) 0.25 % IJ SOLN
INTRAMUSCULAR | Status: DC | PRN
  Administered 2020-12-24: 60 mL

## 2020-12-24 MED ORDER — ONDANSETRON HCL 4 MG/2ML IJ SOLN: 4.0000 mg | Freq: Once | INTRAMUSCULAR | Status: DC | PRN

## 2020-12-24 MED ORDER — FERROUS SULFATE 325 (65 FE) MG PO TABS
325.0000 mg | ORAL_TABLET | Freq: Two times a day (BID) | ORAL | Status: DC
  Administered 2020-12-25 – 2020-12-29 (×9): 325 mg via ORAL
  Filled 2020-12-24 (×9): qty 1

## 2020-12-24 MED ORDER — ACETAMINOPHEN 325 MG PO TABS
325.0000 mg | ORAL_TABLET | Freq: Four times a day (QID) | ORAL | Status: DC | PRN
  Administered 2020-12-26: 650 mg via ORAL
  Filled 2020-12-24: qty 2

## 2020-12-24 MED ORDER — ACETAMINOPHEN 10 MG/ML IV SOLN
INTRAVENOUS | Status: DC | PRN
  Administered 2020-12-24: 1000 mg via INTRAVENOUS

## 2020-12-24 MED ORDER — BISACODYL 10 MG RE SUPP
10.0000 mg | Freq: Every day | RECTAL | Status: DC | PRN
  Filled 2020-12-24: qty 1

## 2020-12-24 MED ORDER — CELECOXIB 200 MG PO CAPS: 400.0000 mg | ORAL_CAPSULE | Freq: Once | ORAL | Status: AC

## 2020-12-24 MED ORDER — BRIMONIDINE TARTRATE 0.2 % OP SOLN
1.0000 [drp] | Freq: Two times a day (BID) | OPHTHALMIC | Status: DC
  Administered 2020-12-25 – 2020-12-29 (×10): 1 [drp] via OPHTHALMIC
  Filled 2020-12-24: qty 5

## 2020-12-24 MED ORDER — SODIUM CHLORIDE 0.9 % IV SOLN: INTRAVENOUS | Status: DC

## 2020-12-24 MED ORDER — PANTOPRAZOLE SODIUM 40 MG PO TBEC
40.0000 mg | DELAYED_RELEASE_TABLET | Freq: Two times a day (BID) | ORAL | Status: DC
  Administered 2020-12-24 – 2020-12-29 (×10): 40 mg via ORAL
  Filled 2020-12-24 (×10): qty 1

## 2020-12-24 MED ORDER — ONDANSETRON HCL 4 MG/2ML IJ SOLN: 4.0000 mg | Freq: Four times a day (QID) | INTRAMUSCULAR | Status: DC | PRN

## 2020-12-24 MED ORDER — CHLORHEXIDINE GLUCONATE 0.12 % MT SOLN
OROMUCOSAL | Status: AC
  Administered 2020-12-24: 15 mL via OROMUCOSAL
  Filled 2020-12-24: qty 15

## 2020-12-24 MED ORDER — MENTHOL 3 MG MT LOZG
1.0000 | LOZENGE | OROMUCOSAL | Status: DC | PRN
Start: 2020-12-23 — End: 2020-12-29
  Filled 2020-12-24: qty 9

## 2020-12-24 MED ORDER — SODIUM CHLORIDE (PF) 0.9 % IJ SOLN: INTRAMUSCULAR | Status: DC | PRN

## 2020-12-24 MED ORDER — ORAL CARE MOUTH RINSE: 15.0000 mL | Freq: Once | OROMUCOSAL | Status: AC

## 2020-12-24 MED ORDER — CEFAZOLIN SODIUM-DEXTROSE 2-4 GM/100ML-% IV SOLN
2.0000 g | INTRAVENOUS | Status: AC
  Administered 2020-12-24: 2 g via INTRAVENOUS

## 2020-12-24 MED ORDER — DEXAMETHASONE SODIUM PHOSPHATE 10 MG/ML IJ SOLN: 8.0000 mg | Freq: Once | INTRAMUSCULAR | Status: AC

## 2020-12-24 MED ORDER — LEVOTHYROXINE SODIUM 50 MCG PO TABS
125.0000 ug | ORAL_TABLET | Freq: Every day | ORAL | Status: DC
  Administered 2020-12-25 – 2020-12-29 (×5): 125 ug via ORAL
  Filled 2020-12-24 (×5): qty 1

## 2020-12-24 MED ORDER — GABAPENTIN 300 MG PO CAPS: 300.0000 mg | ORAL_CAPSULE | Freq: Once | ORAL | Status: AC

## 2020-12-24 MED ORDER — ENSURE PRE-SURGERY PO LIQD
296.0000 mL | Freq: Once | ORAL | Status: DC
  Filled 2020-12-24: qty 296

## 2020-12-24 MED ORDER — FAMOTIDINE 20 MG PO TABS
ORAL_TABLET | ORAL | Status: AC
  Administered 2020-12-24: 20 mg via ORAL
  Filled 2020-12-24: qty 1

## 2020-12-24 MED ORDER — AMLODIPINE BESYLATE 5 MG PO TABS
2.5000 mg | ORAL_TABLET | Freq: Every day | ORAL | Status: DC
  Administered 2020-12-25 – 2020-12-29 (×5): 2.5 mg via ORAL
  Filled 2020-12-24 (×5): qty 1

## 2020-12-24 MED ORDER — TRANEXAMIC ACID-NACL 1000-0.7 MG/100ML-% IV SOLN
1000.0000 mg | INTRAVENOUS | Status: AC
  Administered 2020-12-24: 1000 mg via INTRAVENOUS

## 2020-12-24 MED ORDER — ACETAMINOPHEN 10 MG/ML IV SOLN
1000.0000 mg | Freq: Four times a day (QID) | INTRAVENOUS | Status: AC
  Administered 2020-12-24 – 2020-12-25 (×4): 1000 mg via INTRAVENOUS
  Filled 2020-12-24 (×4): qty 100

## 2020-12-24 MED ORDER — SENNOSIDES-DOCUSATE SODIUM 8.6-50 MG PO TABS
1.0000 | ORAL_TABLET | Freq: Two times a day (BID) | ORAL | Status: DC
  Administered 2020-12-24 – 2020-12-29 (×10): 1 via ORAL
  Filled 2020-12-24 (×10): qty 1

## 2020-12-24 MED ORDER — CEFAZOLIN SODIUM-DEXTROSE 2-4 GM/100ML-% IV SOLN
2.0000 g | Freq: Four times a day (QID) | INTRAVENOUS | Status: AC
Start: 2020-12-24 — End: 2020-12-25
  Administered 2020-12-25 (×2): 2 g via INTRAVENOUS
  Filled 2020-12-24 (×2): qty 100

## 2020-12-24 MED ORDER — GABAPENTIN 300 MG PO CAPS
300.0000 mg | ORAL_CAPSULE | Freq: Every day | ORAL | Status: DC
  Administered 2020-12-24 – 2020-12-28 (×5): 300 mg via ORAL
  Filled 2020-12-24 (×5): qty 1

## 2020-12-24 MED ORDER — HYDROMORPHONE HCL 1 MG/ML IJ SOLN: 0.5000 mg | INTRAMUSCULAR | Status: DC | PRN

## 2020-12-24 MED ORDER — DUTASTERIDE 0.5 MG PO CAPS
0.5000 mg | ORAL_CAPSULE | Freq: Every day | ORAL | Status: DC
  Administered 2020-12-25 – 2020-12-29 (×4): 0.5 mg via ORAL
  Filled 2020-12-24 (×5): qty 1

## 2020-12-24 MED ORDER — ALUM & MAG HYDROXIDE-SIMETH 200-200-20 MG/5ML PO SUSP: 30.0000 mL | ORAL | Status: DC | PRN

## 2020-12-24 MED ORDER — ENOXAPARIN SODIUM 30 MG/0.3ML ~~LOC~~ SOLN
30.0000 mg | Freq: Two times a day (BID) | SUBCUTANEOUS | Status: DC
  Administered 2020-12-25 – 2020-12-29 (×9): 30 mg via SUBCUTANEOUS
  Filled 2020-12-24 (×9): qty 0.3

## 2020-12-24 MED ORDER — OXYCODONE HCL 5 MG PO TABS
5.0000 mg | ORAL_TABLET | ORAL | Status: DC | PRN
  Administered 2020-12-27 – 2020-12-29 (×3): 5 mg via ORAL
  Filled 2020-12-24 (×3): qty 1

## 2020-12-24 MED ORDER — METOCLOPRAMIDE HCL 10 MG PO TABS
10.0000 mg | ORAL_TABLET | Freq: Three times a day (TID) | ORAL | Status: AC
  Administered 2020-12-25 – 2020-12-26 (×8): 10 mg via ORAL
  Filled 2020-12-24 (×8): qty 1

## 2020-12-24 MED ORDER — ACETAMINOPHEN 10 MG/ML IV SOLN
INTRAVENOUS | Status: AC
  Filled 2020-12-24: qty 100

## 2020-12-24 MED ORDER — LACTATED RINGERS IV SOLN: INTRAVENOUS | Status: DC

## 2020-12-24 MED ORDER — CHLORHEXIDINE GLUCONATE 4 % EX LIQD: 60.0000 mL | Freq: Once | CUTANEOUS | Status: DC

## 2020-12-24 MED ORDER — FENTANYL CITRATE (PF) 100 MCG/2ML IJ SOLN
INTRAMUSCULAR | Status: AC
  Filled 2020-12-24: qty 2

## 2020-12-24 MED ORDER — PRAVASTATIN SODIUM 20 MG PO TABS
40.0000 mg | ORAL_TABLET | Freq: Every day | ORAL | Status: DC
  Administered 2020-12-25 – 2020-12-28 (×4): 40 mg via ORAL
  Filled 2020-12-24 (×4): qty 2

## 2020-12-24 MED ORDER — CHLORHEXIDINE GLUCONATE 0.12 % MT SOLN: 15.0000 mL | Freq: Once | OROMUCOSAL | Status: AC

## 2020-12-24 MED ORDER — GABAPENTIN 300 MG PO CAPS
ORAL_CAPSULE | ORAL | Status: AC
  Administered 2020-12-24: 300 mg via ORAL
  Filled 2020-12-24: qty 1

## 2020-12-24 MED ORDER — CELECOXIB 200 MG PO CAPS
200.0000 mg | ORAL_CAPSULE | Freq: Two times a day (BID) | ORAL | Status: DC
  Administered 2020-12-24 – 2020-12-29 (×10): 200 mg via ORAL
  Filled 2020-12-24 (×10): qty 1

## 2020-12-24 MED ORDER — FAMOTIDINE 20 MG PO TABS: 20.0000 mg | ORAL_TABLET | Freq: Once | ORAL | Status: AC

## 2020-12-24 MED ORDER — ADULT MULTIVITAMIN W/MINERALS CH
1.0000 | ORAL_TABLET | Freq: Every day | ORAL | Status: DC
  Administered 2020-12-25 – 2020-12-29 (×5): 1 via ORAL
  Filled 2020-12-24 (×5): qty 1

## 2020-12-24 MED ORDER — BUPIVACAINE HCL (PF) 0.5 % IJ SOLN
INTRAMUSCULAR | Status: DC | PRN
  Administered 2020-12-24: 3 mL

## 2020-12-24 MED ORDER — DIPHENHYDRAMINE HCL 12.5 MG/5ML PO ELIX
12.5000 mg | ORAL_SOLUTION | ORAL | Status: DC | PRN
Start: 2020-12-24 — End: 2020-12-29

## 2020-12-24 MED ORDER — DEXAMETHASONE SODIUM PHOSPHATE 10 MG/ML IJ SOLN
INTRAMUSCULAR | Status: AC
  Administered 2020-12-24: 8 mg via INTRAVENOUS
  Filled 2020-12-24: qty 1

## 2020-12-24 MED ORDER — AZELASTINE HCL 0.1 % NA SOLN
1.0000 | Freq: Every day | NASAL | Status: DC | PRN
  Filled 2020-12-24: qty 30

## 2020-12-24 MED ORDER — ONDANSETRON HCL 4 MG PO TABS: 4.0000 mg | ORAL_TABLET | Freq: Four times a day (QID) | ORAL | Status: DC | PRN

## 2020-12-24 MED ORDER — HYDROCHLOROTHIAZIDE 25 MG PO TABS
12.5000 mg | ORAL_TABLET | Freq: Every day | ORAL | Status: DC
  Administered 2020-12-25 – 2020-12-29 (×5): 12.5 mg via ORAL
  Filled 2020-12-24 (×5): qty 1

## 2020-12-24 MED ORDER — LATANOPROST 0.005 % OP SOLN
1.0000 [drp] | Freq: Every day | OPHTHALMIC | Status: DC
  Administered 2020-12-25 – 2020-12-28 (×5): 1 [drp] via OPHTHALMIC
  Filled 2020-12-24: qty 2.5

## 2020-12-24 MED ORDER — CELECOXIB 200 MG PO CAPS
ORAL_CAPSULE | ORAL | Status: AC
  Administered 2020-12-24: 400 mg via ORAL
  Filled 2020-12-24: qty 2

## 2020-12-24 MED ORDER — PROPOFOL 500 MG/50ML IV EMUL
INTRAVENOUS | Status: AC
  Filled 2020-12-24: qty 50

## 2020-12-24 MED ORDER — PROPOFOL 500 MG/50ML IV EMUL
INTRAVENOUS | Status: DC | PRN
  Administered 2020-12-24: 50 ug/kg/min via INTRAVENOUS

## 2020-12-24 MED ORDER — MAGNESIUM HYDROXIDE 400 MG/5ML PO SUSP
30.0000 mL | Freq: Every day | ORAL | Status: DC
  Administered 2020-12-25 – 2020-12-26 (×2): 30 mL via ORAL
  Filled 2020-12-24 (×4): qty 30

## 2020-12-24 MED ORDER — FENTANYL CITRATE (PF) 100 MCG/2ML IJ SOLN
INTRAMUSCULAR | Status: DC | PRN
  Administered 2020-12-24 (×2): 25 ug via INTRAVENOUS
  Administered 2020-12-24: 50 ug via INTRAVENOUS

## 2020-12-24 MED ORDER — TRAMADOL HCL 50 MG PO TABS
50.0000 mg | ORAL_TABLET | ORAL | Status: DC | PRN
  Administered 2020-12-26: 50 mg via ORAL
  Filled 2020-12-24: qty 1

## 2020-12-24 MED ORDER — SODIUM CHLORIDE 0.9 % IV SOLN
INTRAVENOUS | Status: DC | PRN
  Administered 2020-12-24: 60 mL

## 2020-12-24 MED ORDER — SURGIPHOR WOUND IRRIGATION SYSTEM - OPTIME
TOPICAL | Status: DC | PRN
  Administered 2020-12-24: 1

## 2020-12-24 MED ORDER — PROPOFOL 10 MG/ML IV BOLUS
INTRAVENOUS | Status: DC | PRN
  Administered 2020-12-24: 40 mg via INTRAVENOUS

## 2020-12-24 SURGICAL SUPPLY — 76 items
ATTUNE MED DOME PAT 41 KNEE (Knees) ×1 IMPLANT
ATTUNE PS FEM LT SZ 8 CEM KNEE (Femur) ×1 IMPLANT
BASE TIBIAL ATTUNE KNEE SZ9 (Knees) IMPLANT
BATTERY INSTRU NAVIGATION (MISCELLANEOUS) ×8 IMPLANT
BLADE SAW 70X12.5 (BLADE) ×2 IMPLANT
BLADE SAW 90X13X1.19 OSCILLAT (BLADE) ×2 IMPLANT
BLADE SAW 90X25X1.19 OSCILLAT (BLADE) ×2 IMPLANT
CANISTER PREVENA PLUS 150 (CANNISTER) ×1 IMPLANT
CANISTER SUCT 3000ML PPV (MISCELLANEOUS) ×1 IMPLANT
CEMENT HV SMART SET (Cement) ×2 IMPLANT
COOLER POLAR GLACIER W/PUMP (MISCELLANEOUS) ×2 IMPLANT
COVER WAND RF STERILE (DRAPES) ×2 IMPLANT
CUFF TOURN SGL QUICK 24 (TOURNIQUET CUFF)
CUFF TOURN SGL QUICK 30 (TOURNIQUET CUFF)
CUFF TRNQT CYL 24X4X16.5-23 (TOURNIQUET CUFF) IMPLANT
CUFF TRNQT CYL 30X4X21-28X (TOURNIQUET CUFF) IMPLANT
DRAPE 3/4 80X56 (DRAPES) ×2 IMPLANT
DRSG DERMACEA 8X12 NADH (GAUZE/BANDAGES/DRESSINGS) ×2 IMPLANT
DRSG MEPILEX SACRM 8.7X9.8 (GAUZE/BANDAGES/DRESSINGS) ×2 IMPLANT
DRSG OPSITE POSTOP 4X14 (GAUZE/BANDAGES/DRESSINGS) ×2 IMPLANT
DRSG TEGADERM 4X4.75 (GAUZE/BANDAGES/DRESSINGS) ×2 IMPLANT
DURAPREP 26ML APPLICATOR (WOUND CARE) ×4 IMPLANT
ELECT REM PT RETURN 9FT ADLT (ELECTROSURGICAL) ×2
ELECTRODE REM PT RTRN 9FT ADLT (ELECTROSURGICAL) ×1 IMPLANT
EX-PIN ORTHOLOCK NAV 4X150 (PIN) ×4 IMPLANT
GLOVE BIO SURGEON STRL SZ7.5 (GLOVE) ×4 IMPLANT
GLOVE BIOGEL M STRL SZ7.5 (GLOVE) ×5 IMPLANT
GLOVE BIOGEL PI IND STRL 7.5 (GLOVE) ×1 IMPLANT
GLOVE BIOGEL PI INDICATOR 7.5 (GLOVE) ×2
GLOVE INDICATOR 8.0 STRL GRN (GLOVE) ×3 IMPLANT
GOWN STRL REUS W/ TWL LRG LVL3 (GOWN DISPOSABLE) ×2 IMPLANT
GOWN STRL REUS W/ TWL XL LVL3 (GOWN DISPOSABLE) ×1 IMPLANT
GOWN STRL REUS W/TWL LRG LVL3 (GOWN DISPOSABLE) ×2
GOWN STRL REUS W/TWL XL LVL3 (GOWN DISPOSABLE) ×2
HEMOVAC 400CC 10FR (MISCELLANEOUS) ×2 IMPLANT
HOLDER FOLEY CATH W/STRAP (MISCELLANEOUS) ×1 IMPLANT
HOOD PEEL AWAY FLYTE STAYCOOL (MISCELLANEOUS) ×4 IMPLANT
IRRIGATION SURGIPHOR STRL (IV SOLUTION) ×2 IMPLANT
KIT PREVENA INCISION MGT20CM45 (CANNISTER) ×1 IMPLANT
KIT PUMP PREVENA PLUS 14DAY (MISCELLANEOUS) ×1 IMPLANT
KIT TURNOVER KIT A (KITS) ×2 IMPLANT
KNIFE SCULPS 14X20 (INSTRUMENTS) ×2 IMPLANT
LABEL OR SOLS (LABEL) ×2 IMPLANT
MANIFOLD NEPTUNE II (INSTRUMENTS) ×4 IMPLANT
NDL SAFETY ECLIPSE 18X1.5 (NEEDLE) ×1 IMPLANT
NDL SPNL 20GX3.5 QUINCKE YW (NEEDLE) ×2 IMPLANT
NEEDLE HYPO 18GX1.5 SHARP (NEEDLE) ×1
NEEDLE SPNL 20GX3.5 QUINCKE YW (NEEDLE) ×4 IMPLANT
NS IRRIG 500ML POUR BTL (IV SOLUTION) ×2 IMPLANT
PACK TOTAL KNEE (MISCELLANEOUS) ×2 IMPLANT
PAD WRAPON POLAR KNEE (MISCELLANEOUS) ×1 IMPLANT
PENCIL SMOKE ULTRAEVAC 22 CON (MISCELLANEOUS) ×2 IMPLANT
PIN DRILL QUICK PACK ×2 IMPLANT
PIN FIXATION 1/8DIA X 3INL (PIN) ×6 IMPLANT
PULSAVAC PLUS IRRIG FAN TIP (DISPOSABLE) ×2
SOL .9 NS 3000ML IRR  AL (IV SOLUTION) ×1
SOL .9 NS 3000ML IRR UROMATIC (IV SOLUTION) ×1 IMPLANT
SOL PREP PVP 2OZ (MISCELLANEOUS) ×2
SOLUTION PREP PVP 2OZ (MISCELLANEOUS) ×1 IMPLANT
SPONGE DRAIN TRACH 4X4 STRL 2S (GAUZE/BANDAGES/DRESSINGS) ×2 IMPLANT
STAPLER SKIN PROX 35W (STAPLE) ×2 IMPLANT
STOCKINETTE IMPERV 14X48 (MISCELLANEOUS) IMPLANT
STRAP TIBIA SHORT (MISCELLANEOUS) ×2 IMPLANT
SUCTION FRAZIER HANDLE 10FR (MISCELLANEOUS) ×2
SUCTION TUBE FRAZIER 10FR DISP (MISCELLANEOUS) ×1 IMPLANT
SUT VIC AB 0 CT1 36 (SUTURE) ×4 IMPLANT
SUT VIC AB 1 CT1 36 (SUTURE) ×4 IMPLANT
SUT VIC AB 2-0 CT2 27 (SUTURE) ×2 IMPLANT
SYR 20ML LL LF (SYRINGE) ×2 IMPLANT
SYR 30ML LL (SYRINGE) ×4 IMPLANT
TIBIAL BASE ATTUNE KNEE SZ9 (Knees) ×2 IMPLANT
TIP FAN IRRIG PULSAVAC PLUS (DISPOSABLE) ×1 IMPLANT
TOWEL OR 17X26 4PK STRL BLUE (TOWEL DISPOSABLE) ×2 IMPLANT
TOWER CARTRIDGE SMART MIX (DISPOSABLE) ×2 IMPLANT
TRAY FOLEY MTR SLVR 16FR STAT (SET/KITS/TRAYS/PACK) ×2 IMPLANT
WRAPON POLAR PAD KNEE (MISCELLANEOUS) ×2

## 2020-12-24 NOTE — Anesthesia Preprocedure Evaluation (Signed)
Anesthesia Evaluation  Patient identified by MRN, date of birth, ID band Patient awake    Reviewed: Allergy & Precautions, H&P , NPO status , Patient's Chart, lab work & pertinent test results, reviewed documented beta blocker date and time   Airway Mallampati: II   Neck ROM: full    Dental  (+) Poor Dentition   Pulmonary neg pulmonary ROS, former smoker,    Pulmonary exam normal        Cardiovascular Exercise Tolerance: Poor hypertension, Normal cardiovascular exam+ Valvular Problems/Murmurs  Rhythm:regular Rate:Normal     Neuro/Psych  Neuromuscular disease negative psych ROS   GI/Hepatic negative GI ROS, Neg liver ROS,   Endo/Other  Hypothyroidism   Renal/GU negative Renal ROS  negative genitourinary   Musculoskeletal   Abdominal   Peds  Hematology negative hematology ROS (+)   Anesthesia Other Findings Past Medical History: No date: Arthritis No date: childhood infection     Comment:  chicken pox, measels, and mumps No date: Hypertension No date: Hypothyroidism No date: Sinus problem No date: Ulnar neuropathy     Comment:  bilateral Past Surgical History: No date: APPENDECTOMY No date: CATARACT EXTRACTION; Bilateral No date: COLONOSCOPY No date: JOINT REPLACEMENT; Right 10/04/2011: KNEE SURGERY; Right     Comment:  replacement 07/21/2017: NM MYOVIEW ([facility] HX)     Comment:  KC. No ischemia.  No date: RETINAL DETACHMENT REPAIR W/ SCLERAL BUCKLE LE No date: TONSILLECTOMY BMI    Body Mass Index: 27.44 kg/m     Reproductive/Obstetrics negative OB ROS                             Anesthesia Physical Anesthesia Plan  ASA: III  Anesthesia Plan: Spinal   Post-op Pain Management:    Induction:   PONV Risk Score and Plan:   Airway Management Planned:   Additional Equipment:   Intra-op Plan:   Post-operative Plan:   Informed Consent: I have reviewed the patients  History and Physical, chart, labs and discussed the procedure including the risks, benefits and alternatives for the proposed anesthesia with the patient or authorized representative who has indicated his/her understanding and acceptance.     Dental Advisory Given  Plan Discussed with: CRNA  Anesthesia Plan Comments:         Anesthesia Quick Evaluation

## 2020-12-24 NOTE — Transfer of Care (Signed)
Immediate Anesthesia Transfer of Care Note  Patient: Daniel Carr  Procedure(s) Performed: COMPUTER ASSISTED TOTAL KNEE ARTHROPLASTY (Left Knee)  Patient Location: PACU  Anesthesia Type:Spinal  Level of Consciousness: drowsy and patient cooperative  Airway & Oxygen Therapy: Patient Spontanous Breathing  Post-op Assessment: Report given to RN and Post -op Vital signs reviewed and stable  Post vital signs: Reviewed and stable  Last Vitals:  Vitals Value Taken Time  BP 110/74 12/24/20 2116  Temp    Pulse 95 12/24/20 2116  Resp 20 12/24/20 2116  SpO2 98 % 12/24/20 2116  Vitals shown include unvalidated device data.  Last Pain:  Vitals:   12/24/20 1337  TempSrc: Oral  PainSc: 0-No pain         Complications: No complications documented.

## 2020-12-24 NOTE — H&P (Signed)
The patient has been re-examined, and the chart reviewed, and there have been no interval changes to the documented history and physical.    The risks, benefits, and alternatives have been discussed at length. The patient expressed understanding of the risks benefits and agreed with plans for surgical intervention.  Dreanna Kyllo P. Makiyah Zentz, Jr. M.D.    

## 2020-12-24 NOTE — Plan of Care (Signed)
  Problem: Pain Managment: Goal: General experience of comfort will improve 12/24/2020 2252 by Deirdre Pippins, RN Outcome: Progressing 12/24/2020 2252 by Deirdre Pippins, RN Outcome: Progressing   Problem: Safety: Goal: Ability to remain free from injury will improve 12/24/2020 2252 by Deirdre Pippins, RN Outcome: Progressing 12/24/2020 2252 by Deirdre Pippins, RN Outcome: Progressing   Problem: Skin Integrity: Goal: Risk for impaired skin integrity will decrease 12/24/2020 2252 by Deirdre Pippins, RN Outcome: Progressing 12/24/2020 2252 by Deirdre Pippins, RN Outcome: Progressing

## 2020-12-24 NOTE — Anesthesia Procedure Notes (Signed)
Date/Time: 12/24/2020 5:25 PM Performed by: Junious Silk, CRNA Pre-anesthesia Checklist: Patient identified, Emergency Drugs available, Suction available, Patient being monitored and Timeout performed Oxygen Delivery Method: Simple face mask

## 2020-12-24 NOTE — Anesthesia Procedure Notes (Signed)
Spinal  Patient location during procedure: OR Start time: 12/24/2020 5:05 PM End time: 12/24/2020 5:08 PM Staffing Performed: resident/CRNA  Resident/CRNA: Nelda Marseille, CRNA Preanesthetic Checklist Completed: patient identified, IV checked, site marked, risks and benefits discussed, surgical consent, monitors and equipment checked, pre-op evaluation and timeout performed Spinal Block Patient position: sitting Prep: Betadine Patient monitoring: heart rate, continuous pulse ox, blood pressure and cardiac monitor Approach: midline Location: L3-4 Injection technique: single-shot Needle Needle type: Whitacre and Introducer  Needle gauge: 25 G Needle length: 9 cm Additional Notes Negative paresthesia. Negative blood return. Positive free-flowing CSF. Expiration date of kit checked and confirmed. Patient tolerated procedure well, without complications.

## 2020-12-24 NOTE — Op Note (Signed)
OPERATIVE NOTE  DATE OF SURGERY:  12/24/2020  PATIENT NAME:  Daniel Carr   DOB: August 17, 1935  MRN: [id number]  PRE-OPERATIVE DIAGNOSIS: Degenerative arthrosis of the left knee, primary  POST-OPERATIVE DIAGNOSIS:  Same  PROCEDURE:  Left total knee arthroplasty using computer-assisted navigation  SURGEON:  Jena Gauss. M.D.  ASSISTANT: Baldwin Jamaica, PA-C (present and scrubbed throughout the case, critical for assistance with exposure, retraction, instrumentation, and closure)  ANESTHESIA: spinal  ESTIMATED BLOOD LOSS: 50 mL  FLUIDS REPLACED: 1700 mL of crystalloid  TOURNIQUET TIME: 124 minutes  DRAINS: 2 medium Hemovac  SOFT TISSUE RELEASES: Anterior cruciate ligament, posterior cruciate ligament, deep medial collateral ligament, patellofemoral ligament  IMPLANTS UTILIZED: DePuy Attune size 8 posterior stabilized femoral component (cemented), size 9 rotating platform tibial component (cemented), 41 mm medialized dome patella (cemented), and a 12 mm stabilized rotating platform polyethylene insert.  INDICATIONS FOR SURGERY: Daniel Carr is a 85 y.o. year old male with a long history of progressive knee pain. X-rays demonstrated severe degenerative changes in tricompartmental fashion. The patient had not seen any significant improvement despite conservative nonsurgical intervention. After discussion of the risks and benefits of surgical intervention, the patient expressed understanding of the risks benefits and agree with plans for total knee arthroplasty.   The risks, benefits, and alternatives were discussed at length including but not limited to the risks of infection, bleeding, nerve injury, stiffness, blood clots, the need for revision surgery, cardiopulmonary complications, among others, and they were willing to proceed.  PROCEDURE IN DETAIL: The patient was brought into the operating room and, after adequate spinal anesthesia was achieved, a tourniquet was placed on the  patient's upper thigh. The patient's knee and leg were cleaned and prepped with alcohol and DuraPrep and draped in the usual sterile fashion. A "timeout" was performed as per usual protocol. The lower extremity was exsanguinated using an Esmarch, and the tourniquet was inflated to 300 mmHg. An anterior longitudinal incision was made followed by a standard mid vastus approach. The deep fibers of the medial collateral ligament were elevated in a subperiosteal fashion off of the medial flare of the tibia so as to maintain a continuous soft tissue sleeve. The patella was subluxed laterally and the patellofemoral ligament was incised. Inspection of the knee demonstrated severe degenerative changes with full-thickness loss of articular cartilage. Osteophytes were debrided using a rongeur. Anterior and posterior cruciate ligaments were excised. Two 4.0 mm Schanz pins were inserted in the femur and into the tibia for attachment of the array of trackers used for computer-assisted navigation. Hip center was identified using a circumduction technique. Distal landmarks were mapped using the computer. The distal femur and proximal tibia were mapped using the computer. The distal femoral cutting guide was positioned using computer-assisted navigation so as to achieve a 5 distal valgus cut. The femur was sized and it was felt that a size 8 femoral component was appropriate. A size 8 femoral cutting guide was positioned and the anterior cut was performed and verified using the computer. This was followed by completion of the posterior and chamfer cuts. Femoral cutting guide for the central box was then positioned in the center box cut was performed.  Attention was then directed to the proximal tibia. Medial and lateral menisci were excised. The extramedullary tibial cutting guide was positioned using computer-assisted navigation so as to achieve a 0 varus-valgus alignment and 3 posterior slope. The cut was performed and  verified using the computer. The proximal tibia was sized and it  was felt that a size 9 tibial tray was appropriate. Tibial and femoral trials were inserted followed by insertion of a 12 mm polyethylene insert. This allowed for excellent mediolateral soft tissue balancing both in flexion and in full extension. Finally, the patella was cut and prepared so as to accommodate a 41 mm medialized dome patella. A patella trial was placed and the knee was placed through a range of motion with excellent patellar tracking appreciated. The femoral trial was removed after debridement of posterior osteophytes. The central post-hole for the tibial component was reamed followed by insertion of a keel punch. Tibial trials were then removed. Cut surfaces of bone were irrigated with copious amounts of normal saline using pulsatile lavage and then suctioned dry. Polymethylmethacrylate cement was prepared in the usual fashion using a vacuum mixer. Cement was applied to the cut surface of the proximal tibia as well as along the undersurface of a size 9 rotating platform tibial component. Tibial component was positioned and impacted into place. Excess cement was removed using [employer] fast streamer. Cement was then applied to the cut surfaces of the femur as well as along the posterior flanges of the size 8 femoral component. The femoral component was positioned and impacted into place. Excess cement was removed using [employer] fast streamer. A 12 mm polyethylene trial was inserted and the knee was brought into full extension with steady axial compression applied. Finally, cement was applied to the backside of a 41 mm medialized dome patella and the patellar component was positioned and patellar clamp applied. Excess cement was removed using [employer] fast streamer. After adequate curing of the cement, the tourniquet was deflated after a total tourniquet time of 124 minutes. Hemostasis was achieved using electrocautery. The knee was irrigated with copious  amounts of normal saline using pulsatile lavage followed by 500 ml of Surgiphor and then suctioned dry. 20 mL of 1.3% Exparel and 60 mL of 0.25% Marcaine in 40 mL of normal saline was injected along the posterior capsule, medial and lateral gutters, and along the arthrotomy site. A 12 mm stabilized rotating platform polyethylene insert was inserted and the knee was placed through a range of motion with excellent mediolateral soft tissue balancing appreciated and excellent patellar tracking noted. 2 medium drains were placed in the wound bed and brought out through separate stab incisions. The medial parapatellar portion of the incision was reapproximated using interrupted sutures of #1 Vicryl. Subcutaneous tissue was approximated in layers using first #0 Vicryl followed #2-0 Vicryl. The skin was approximated with skin staples. A sterile dressing was applied.  The patient tolerated the procedure well and was transported to the recovery room in stable condition.    Marialy Urbanczyk P. Holley Bouche., M.D.

## 2020-12-25 ENCOUNTER — Encounter: Payer: Self-pay | Admitting: Orthopedic Surgery

## 2020-12-25 MED ORDER — LORATADINE 10 MG PO TABS
10.0000 mg | ORAL_TABLET | Freq: Every evening | ORAL | Status: DC
  Administered 2020-12-25 – 2020-12-28 (×4): 10 mg via ORAL
  Filled 2020-12-25 (×4): qty 1

## 2020-12-25 MED ORDER — ENOXAPARIN SODIUM 40 MG/0.4ML ~~LOC~~ SOLN: 40.0000 mg | SUBCUTANEOUS | 0 refills | Status: DC

## 2020-12-25 MED ORDER — CELECOXIB 200 MG PO CAPS: 200.0000 mg | ORAL_CAPSULE | Freq: Two times a day (BID) | ORAL | 0 refills | Status: DC

## 2020-12-25 MED ORDER — OXYCODONE HCL 5 MG PO TABS: 5.0000 mg | ORAL_TABLET | ORAL | 0 refills | Status: DC | PRN

## 2020-12-25 MED ORDER — TRAMADOL HCL 50 MG PO TABS: 50.0000 mg | ORAL_TABLET | ORAL | 0 refills | Status: DC | PRN

## 2020-12-25 NOTE — Progress Notes (Addendum)
Physical Therapy Treatment Patient Details Name: Daniel Carr MRN: [id number] DOB: 26-Feb-1935 Today's Date: 12/25/2020    History of Present Illness Patient is a 85 year old male with degenerative arthrosis of the left knee s/p left total knee arthroplasty 12/24/20. Past medical history of right total knee arthroplasty 2012, hypertension, osteoarthritis, eye surgery detached retina, glaucoma    PT Comments    Patient making progress towards meeting PT goals. Patient has progressed ambulation distance this session. Min guard required for safety with ambulation using rolling walker and patient relying heavily on walker for support in standing. Min guard assistance for transferrs with rolling walker with occasional cues for safety and technique. Patient performed LLE exercises for strengthening/ROM. Discharge plan for SNF remains appropriate. Recommend to continue PT to maximize independence and address remaining functional limitations.    Follow Up Recommendations  SNF     Equipment Recommendations  Rolling walker with 5" wheels    Recommendations for Other Services       Precautions / Restrictions Precautions Precautions: Knee Precaution Booklet Issued: Yes (comment) Restrictions Weight Bearing Restrictions: Yes LLE Weight Bearing: Weight bearing as tolerated    Mobility  Bed Mobility Overal bed mobility: Needs Assistance Bed Mobility: Supine to Sit;Sit to Supine     Supine to sit: Supervision Sit to supine: Min guard   General bed mobility comments: verbal cues for technique. extra time required to complete tasks  Transfers Overall transfer level: Needs assistance Equipment used: Rolling walker (2 wheeled) Transfers: Sit to/from Stand Sit to Stand: Min guard;From elevated surface         General transfer comment: verbal cues for hand placement for safety. cues for LLE positioning with sitting  Ambulation/Gait Ambulation/Gait assistance: Min guard Gait Distance  (Feet): 35 Feet Assistive device: Rolling walker (2 wheeled) Gait Pattern/deviations: Step-to pattern;Decreased stance time - left;Decreased step length - left Gait velocity: decreased   General Gait Details: with occasional verbal cues for technique using rolling walker, patient demonstrated correct technique. gait distance limited by fatigue and patient relying heavily on rolling walker for UE support in standing   Stairs             Wheelchair Mobility    Modified Rankin (Stroke Patients Only)       Balance Overall balance assessment: Needs assistance Sitting-balance support: No upper extremity supported;Feet supported Sitting balance-Leahy Scale: Good     Standing balance support: Bilateral upper extremity supported Standing balance-Leahy Scale: Fair Standing balance comment: patient relying heavily on rolling walker for UE support in standing                            Cognition Arousal/Alertness: Awake/alert Behavior During Therapy: WFL for tasks assessed/performed Overall Cognitive Status: Within Functional Limits for tasks assessed                                        Exercises Total Joint Exercises Quad Sets: AROM;Strengthening;Left;10 reps;Supine Short Arc Quad: AAROM;Strengthening;Left;10 reps;Supine Heel Slides: AAROM;Strengthening;Left;10 reps;Supine Hip ABduction/ADduction: AAROM;Strengthening;Left;10 reps;Supine Straight Leg Raises: AROM;Strengthening;Left;10 reps;Supine Other Exercises Other Exercises: verbal cues for exercise technique and reviewed seated and supine exercise in HEP handout    General Comments        Pertinent Vitals/Pain Pain Assessment: 0-10 Pain Score: 4  Pain Location: left knee Pain Descriptors / Indicators: Sore Pain Intervention(s): Ice applied;Limited activity  within patient's tolerance;Monitored during session (polar care reapplied at end of session)    Home Living Family/patient  expects to be discharged to:: Private residence Living Arrangements: Spouse/significant other;Children (daughter lives with parents and acts as caregiver for pt's spouse as she has dementia)   Type of Home: House Home Access: Stairs to enter   Home Layout: One level [place]: Kasandra Knudsen - single point;Shower seat;Grab bars - tub/shower      Prior Function Level of Independence: Independent with assistive device(s)      Comments: intermittent use of single point cane, otherwise independent with activity, ADL, and drives   PT Goals (current goals can now be found in the care plan section) Acute Rehab PT Goals Patient Stated Goal: to go to rehab at discharge PT Goal Formulation: With patient Time For Goal Achievement: 01/08/21 Potential to Achieve Goals: Good Progress towards PT goals: Progressing toward goals    Frequency    BID      PT Plan Current plan remains appropriate    Co-evaluation              AM-PAC PT "6 Clicks" Mobility   Outcome Measure  Help needed turning from your back to your side while in a flat bed without using bedrails?: A Little Help needed moving from lying on your back to sitting on the side of a flat bed without using bedrails?: A Little Help needed moving to and from a bed to a chair (including a wheelchair)?: A Little Help needed standing up from a chair using your arms (e.g., wheelchair or bedside chair)?: A Little Help needed to walk in hospital room?: A Little Help needed climbing 3-5 steps with a railing? : A Lot 6 Click Score: 17    End of Session Equipment Utilized During Treatment: Gait belt Activity Tolerance: Patient tolerated treatment well Patient left: in bed;with call bell/phone within reach;with SCD's reapplied;with bed alarm set (polar care reapplied left knee) Nurse Communication: Mobility status PT Visit Diagnosis: Muscle weakness (generalized) (M62.81);Other abnormalities of gait and mobility (R26.89);Pain Pain -  Right/Left: Left Pain - part of body: Knee     Time: [id number] PT Time Calculation (min) (ACUTE ONLY): 29 min  Charges:  $Gait Training: 8-22 mins $Therapeutic Exercise: 8-22 mins                     Minna Merritts, PT, MPT    Percell Locus 12/25/2020, 3:00 PM

## 2020-12-25 NOTE — Plan of Care (Signed)
  Problem: Pain Managment: Goal: General experience of comfort will improve 12/25/2020 0248 by Deirdre Pippins, RN Outcome: Progressing 12/24/2020 2252 by Deirdre Pippins, RN Outcome: Progressing 12/24/2020 2252 by Deirdre Pippins, RN Outcome: Progressing   Problem: Safety: Goal: Ability to remain free from injury will improve 12/25/2020 0248 by Deirdre Pippins, RN Outcome: Progressing 12/24/2020 2252 by Deirdre Pippins, RN Outcome: Progressing 12/24/2020 2252 by Deirdre Pippins, RN Outcome: Progressing   Problem: Skin Integrity: Goal: Risk for impaired skin integrity will decrease 12/25/2020 0248 by Deirdre Pippins, RN Outcome: Progressing 12/24/2020 2252 by Deirdre Pippins, RN Outcome: Progressing 12/24/2020 2252 by Deirdre Pippins, RN Outcome: Progressing

## 2020-12-25 NOTE — Progress Notes (Signed)
ORTHOPAEDICS PROGRESS NOTE  PATIENT NAME: Daniel Carr DOB: 1935-01-27  MRN: [id number]  POD # 1: Left total knee arthroplasty  Subjective: The patient rested well last night.  Pain is been under good control.  He denies any nausea or vomiting.  Objective: Vital signs in last 24 hours: Temp:  [96.8 F (36 C)-97.9 F (36.6 C)] 97.8 F (36.6 C) (12/30 0526) Pulse Rate:  [72-96] 73 (12/30 0526) Resp:  [15-25] 18 (12/30 0118) BP: (110-153)/(68-94) 116/73 (12/30 0526) SpO2:  [91 %-100 %] 99 % (12/30 0526) Weight:  [94.3 kg] 94.3 kg (12/29 1337)  Intake/Output from previous day: 12/29 0701 - 12/30 0700 In: 3028.3 [I.V.:2328.3; IV Piggyback:700] Out: 870 [Urine:600; Drains:220; Blood:50]  No results for input(s): WBC, HGB, HCT, PLT, K, CL, CO2, BUN, CREATININE, GLUCOSE, CALCIUM, LABPT, INR in the last 72 hours.  EXAM General: Well-developed well-nourished male seen in no apparent discomfort. Lungs: clear to auscultation Cardiac: normal rate and regular rhythm Abdomen: Soft, nontender, nondistended.  Bowel sounds are present. Left lower extremity: Dressing is dry and intact.  Polar Care is intact and functioning.  Homans test is negative.  The patient is able to perform an independent straight leg raise. Neurologic: Awake, alert, and oriented.  Sensory and motor function are intact.  Assessment: Left total knee arthroplasty  Secondary diagnoses: Hypothyroidism Hypertension Ulnar neuropathy, bilateral  Plan: Today's goals were reviewed with the patient. Begin physical therapy and Occupational Therapy as per total knee arthroplasty rehab protocol. Care Management to assist with discharge planning.  Possible need for skilled nursing due to family situation. DVT Prophylaxis - Lovenox, Foot Pumps and TED hose  Yajaira Doffing P. Angie Fava M.D.

## 2020-12-25 NOTE — Anesthesia Postprocedure Evaluation (Signed)
Anesthesia Post Note  Patient: Daniel Carr  Procedure(s) Performed: COMPUTER ASSISTED TOTAL KNEE ARTHROPLASTY (Left Knee)  Patient location during evaluation: Nursing Unit Anesthesia Type: Spinal Level of consciousness: oriented and awake and alert Pain management: pain level controlled Vital Signs Assessment: post-procedure vital signs reviewed and stable Respiratory status: spontaneous breathing and respiratory function stable Cardiovascular status: blood pressure returned to baseline and stable Postop Assessment: no headache, no backache, no apparent nausea or vomiting and patient able to bend at knees Anesthetic complications: no   No complications documented.   Last Vitals:  Vitals:   12/25/20 0118 12/25/20 0526  BP: 110/73 116/73  Pulse: 84 73  Resp: 18   Temp: 36.6 C 36.6 C  SpO2: 97% 99%    Last Pain:  Vitals:   12/24/20 2143  TempSrc:   PainSc: 0-No pain                 Mathews Argyle P

## 2020-12-25 NOTE — Evaluation (Signed)
Occupational Therapy Evaluation Patient Details Name: Daniel Carr MRN: [id number] DOB: 12-Aug-1935 Today's Date: 12/25/2020    History of Present Illness Patient is a 85 year old male with degenerative arthrosis of the left knee s/p left total knee arthroplasty 12/24/20. Past medical history of right total knee arthroplasty 2012, hypertension, osteoarthritis, eye syrgery, detached retina, glaucoma   Clinical Impression   Pt seen for OT evaluation this date, POD#1 from above surgery. Pt was independent in all ADLs prior to surgery. Pt is eager to return to PLOF with less pain and improved safety and independence. Pt currently requires MIN A for LB dressing while in seated position due to pain and limited AROM of L knee. Pt requires MIN A for ADL transfers with RW as well as cues for safe AD use. Pt instructed in polar care mgt, falls prevention strategies, home/routines modifications, DME/AE for LB bathing and dressing tasks, and compression stocking mgt. Pt would benefit from skilled OT services including additional instruction in dressing techniques with or without assistive devices for dressing and bathing skills to support recall and carryover prior to discharge and ultimately to maximize safety, independence, and minimize falls risk and caregiver burden. Anticipate pt will require f/u therapy at SNF/STR to improve ADL transfers and general ADL independence in prep for return to his home setup and living situation safely.    Follow Up Recommendations  SNF    Equipment Recommendations  Other (comment) (defer to next level of care)    Recommendations for Other Services       Precautions / Restrictions Precautions Precautions: Knee Precaution Booklet Issued: Yes (comment) Restrictions Weight Bearing Restrictions: Yes LLE Weight Bearing: Weight bearing as tolerated      Mobility Bed Mobility               General bed mobility comments: pt up to chair pre/post session     Transfers Overall transfer level: Needs assistance Equipment used: Rolling walker (2 wheeled) Transfers: Sit to/from Stand Sit to Stand: Min assist;Min guard         General transfer comment: Pt demos good control, requires one cue for hand placement with RW. Some lift assist, but he is otherwise able to complete STS transfer from chair, pushing from arm rests.    Balance Overall balance assessment: Needs assistance Sitting-balance support: No upper extremity supported;Feet supported Sitting balance-Leahy Scale: Good     Standing balance support: Bilateral upper extremity supported Standing balance-Leahy Scale: Fair Standing balance comment: patient relying heavily on rolling walker for UE support in standing                           ADL either performed or assessed with clinical judgement   ADL                                         General ADL Comments: requires MIN A for seated LB ADLs and extra time to perform, SETUP for UB ADLs except some MIN A for opening packages/condiments d/t decreased pinch/grasp with 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     Vision Baseline Vision/History: Wears glasses Wears Glasses: At all times Patient Visual Report: No change from baseline       Perception     Praxis      Pertinent Vitals/Pain Pain Assessment: 0-10 Pain Score: 4  Pain Location: left  knee Pain Descriptors / Indicators: Sore Pain Intervention(s): Limited activity within patient's tolerance;Monitored during session     Hand Dominance     Extremity/Trunk Assessment Upper Extremity Assessment Upper Extremity Assessment: Overall WFL for tasks assessed;Generalized weakness (pt with h/o decreased digit joint ROM of hands at baseline d/t various tendon issues per patient. Most notably, bilateral 5th digits with very limited flexion at PIP and fized flexion of DIP. Pt reports this impedes his grip, pinch, and grasp at baseline)   Lower  Extremity Assessment Lower Extremity Assessment: Defer to PT evaluation;RLE deficits/detail;LLE deficits/detail RLE Deficits / Details: WNL RLE Sensation: WNL LLE Sensation: WNL       Communication Communication Communication: No difficulties   Cognition Arousal/Alertness: Awake/alert Behavior During Therapy: WFL for tasks assessed/performed Overall Cognitive Status: Within Functional Limits for tasks assessed                                     General Comments       Exercises Other Exercises Other Exercises: OT faciltiaets ed re: use of AE for LB ADLs as needed/appropriate given the anatomy of his hands/digits. In addition, ed re: polar care mgt, compression stocking use, and general safety. handout issued and pt with good understanding of education.   Shoulder Instructions      Home Living Family/patient expects to be discharged to:: Private residence Living Arrangements: Spouse/significant other;Children (daughter lives with parents and acts as caregiver for pt's spouse as she has dementia)   Type of Home: House Home Access: Stairs to enter Secretary/administrator of Steps: 1   Home Layout: One level     Bathroom Shower/Tub: Producer, television/film/video: Handicapped height     Home Equipment: Cane - single point;Shower seat;Grab bars - tub/shower          Prior Functioning/Environment Level of Independence: Independent with assistive device(s)        Comments: intermittent use of single point cane, otherwise independent with activity, ADL, and drives        OT Problem List: Decreased strength;Decreased range of motion;Decreased activity tolerance;Impaired balance (sitting and/or standing);Decreased knowledge of use of DME or AE;Impaired UE functional use      OT Treatment/Interventions: Self-care/ADL training;DME and/or AE instruction;Therapeutic activities;Therapeutic exercise;Patient/family education    OT Goals(Current goals can be  found in the care plan section) Acute Rehab OT Goals Patient Stated Goal: to go to rehab at discharge OT Goal Formulation: With patient Time For Goal Achievement: 01/08/21 Potential to Achieve Goals: Good  OT Frequency: Min 1X/week   Barriers to D/C:            Co-evaluation              AM-PAC OT "6 Clicks" Daily Activity     Outcome Measure Help from another person eating meals?: None Help from another person taking care of personal grooming?: None Help from another person toileting, which includes using toliet, bedpan, or urinal?: A Lot Help from another person bathing (including washing, rinsing, drying)?: A Lot Help from another person to put on and taking off regular upper body clothing?: None Help from another person to put on and taking off regular lower body clothing?: A Little 6 Click Score: 19   End of Session Equipment Utilized During Treatment: Gait belt;Rolling walker  Activity Tolerance: Patient tolerated treatment well Patient left: in chair;with call bell/phone within reach;with chair alarm set;with  SCD's reapplied  OT Visit Diagnosis: Other abnormalities of gait and mobility (R26.89);Muscle weakness (generalized) (M62.81)                Time: 2956-2130 OT Time Calculation (min): 23 min Charges:  OT General Charges $OT Visit: 1 Visit OT Evaluation $OT Eval Low Complexity: 1 Low OT Treatments $Self Care/Home Management : 8-22 mins  Rejeana Brock, MS, OTR/L ascom [phone] 12/25/20, 2:39 PM

## 2020-12-25 NOTE — TOC Initial Note (Signed)
Transition of Care [facility]) - Initial/Assessment Note    Patient Details  Name: Daniel Carr MRN: [id number] Date of Birth: April 11, 1935  [facility] CM/SW Contact:    Shelbie Ammons, RN Phone Number: 12/25/2020, 11:20 AM  Clinical Narrative:    RNCM met with patient in room, patient is alert and verbally responsive lying in bed in no acute distress. Patient is status post total knee replacement yesterday. Discussed recommendations for SNF vs HH, patient reports that he has already been contacted by [facility] for home health but at this point he is unsure if he may need SNF or not. Ultimately patient is agreeable to SNF work up and reports that his first preference would be [facility] and he will not consider [facility] or [facility].  RNCM verified PASSR, completed FL2 and started bed search.                Expected Discharge Plan: Skilled Nursing Facility Barriers to Discharge: No Barriers Identified   Patient Goals and CMS Choice        Expected Discharge Plan and Services Expected Discharge Plan: [place] Acute Care Choice: [place] Living arrangements for the past 2 months: Single Family Home                                      Prior Living Arrangements/Services Living arrangements for the past 2 months: Single Family Home Lives with:: Spouse Patient language and need for interpreter reviewed:: Yes Do you feel safe going back to the place where you live?: Yes      Need for Family Participation in Patient Care: Yes (Comment) Care giver support system in place?: Yes (comment)   Criminal Activity/Legal Involvement Pertinent to Current Situation/Hospitalization: No - Comment as needed  Activities of Daily Living Home Assistive Devices/Equipment: Eyeglasses,Dentures (specify type),Wheelchair,Raised toilet seat with rails,Cane (specify quad or straight) ADL Screening (condition at time of admission) Patient's  cognitive ability adequate to safely complete daily activities?: Yes Is the patient deaf or have difficulty hearing?: No Does the patient have difficulty seeing, even when wearing glasses/contacts?: No Does the patient have difficulty concentrating, remembering, or making decisions?: No Patient able to express need for assistance with ADLs?: Yes Does the patient have difficulty dressing or bathing?: No Independently performs ADLs?: Yes (appropriate for developmental age) Does the patient have difficulty walking or climbing stairs?: Yes Weakness of Legs: None Weakness of Arms/Hands: None  Permission Sought/Granted                  Emotional Assessment Appearance:: Appears stated age Attitude/Demeanor/Rapport: Engaged Affect (typically observed): Appropriate,Calm Orientation: : Oriented to Self,Oriented to Place,Oriented to  Time,Oriented to Situation Alcohol / Substance Use: Not Applicable Psych Involvement: No (comment)  Admission diagnosis:  Total knee replacement status [Z96.659] Patient Active Problem List   Diagnosis Date Noted  . Total knee replacement status 12/24/2020  . Systolic murmur 44/31/5400  . Allergic rhinitis 02/19/2019  . Chronic bronchitis ([place]) 01/31/2019  . MGUS (monoclonal gammopathy of unknown significance) 11/11/2018  . Systolic dysfunction 86/76/1950  . Primary osteoarthritis of left knee 07/28/2017  . Callus of foot 07/27/2016  . Nail dystrophy 07/27/2016  . Arthropathia 06/09/2015  . Neuropathic ulnar nerve 06/09/2015  . Glaucoma   . Hypothyroid   . Pure hypercholesterolemia   . Hypertension, benign   .  Arthritis, multiple joint involvement   . BPH (benign prostatic hyperplasia)   . ED (erectile dysfunction)   . Nocturia associated with benign prostatic hypertrophy    PCP:  [name], MD Pharmacy:   [facility] [address] [place]) [place]  [address] Phone: [phone] Fax: [phone]     Social Determinants of Health (SDOH) Interventions    Readmission Risk Interventions No flowsheet data found.

## 2020-12-25 NOTE — NC FL2 (Signed)
[place] LEVEL OF CARE SCREENING TOOL     IDENTIFICATION  Patient Name: Daniel Carr Birthdate: 12-02-1935 Sex: male Admission Date (Current Location): 12/24/2020  [place] and [place] Number:  Engineering geologist and Address:  [facility], [address] [place], [place] [address]      Provider Number: [id number]  Attending Physician Name and Address:  Dereck Leep, MD  Relative Name and Phone Number:  Lew Dawes [phone]    Current Level of Care: Hospital Recommended Level of Care: [place] Prior Approval Number:    Date Approved/Denied:   PASRR Number: [id number]  Discharge Plan: SNF    Current Diagnoses: Patient Active Problem List   Diagnosis Date Noted  . Total knee replacement status 12/24/2020  . Systolic murmur 82/50/5397  . Allergic rhinitis 02/19/2019  . Chronic bronchitis ([place]) 01/31/2019  . MGUS (monoclonal gammopathy of unknown significance) 11/11/2018  . Systolic dysfunction 67/34/1935  . Primary osteoarthritis of left knee 07/28/2017  . Callus of foot 07/27/2016  . Nail dystrophy 07/27/2016  . Arthropathia 06/09/2015  . Neuropathic ulnar nerve 06/09/2015  . Glaucoma   . Hypothyroid   . Pure hypercholesterolemia   . Hypertension, benign   . Arthritis, multiple joint involvement   . BPH (benign prostatic hyperplasia)   . ED (erectile dysfunction)   . Nocturia associated with benign prostatic hypertrophy     Orientation RESPIRATION BLADDER Height & Weight     Self,Time,Situation,Place  Normal Continent Weight: 94.3 kg Height:  6\' 1"  (185.4 cm)  BEHAVIORAL SYMPTOMS/MOOD NEUROLOGICAL BOWEL NUTRITION STATUS      Continent Diet (Regular)  AMBULATORY STATUS COMMUNICATION OF NEEDS Skin   Extensive Assist Verbally Surgical wounds                       Personal Care Assistance Level of Assistance  Bathing,Feeding,Dressing Bathing Assistance: Limited assistance Feeding  assistance: Independent Dressing Assistance: Limited assistance     Functional Limitations Info             SPECIAL CARE FACTORS FREQUENCY  PT (By licensed PT),OT (By licensed OT)                    Contractures Contractures Info: Not present    Additional Factors Info  Code Status,Allergies Code Status Info: Full Allergies Info: No known allergies           Current Medications (12/25/2020):  This is the current hospital active medication list Current Facility-Administered Medications  Medication Dose Route Frequency Provider Last Rate Last Admin  . 0.9 %  sodium chloride infusion   Intravenous Continuous Dereck Leep, MD 100 mL/hr at 12/25/20 1100 New Bag at 12/25/20 1100  . acetaminophen (OFIRMEV) IV 1,000 mg  1,000 mg Intravenous Q6H Hooten, Laurice Record, MD 400 mL/hr at 12/25/20 0505 1,000 mg at 12/25/20 0505  . acetaminophen (TYLENOL) tablet 325-650 mg  325-650 mg Oral Q6H PRN Hooten, Laurice Record, MD      . alum & mag hydroxide-simeth (MAALOX/MYLANTA) 200-200-20 MG/5ML suspension 30 mL  30 mL Oral Q4H PRN Hooten, Laurice Record, MD      . amLODipine (NORVASC) tablet 2.5 mg  2.5 mg Oral Daily Hooten, Laurice Record, MD   2.5 mg at 12/25/20 0955  . azelastine (ASTELIN) 0.1 % nasal spray 1 spray  1 spray Each Nare Daily PRN Hooten, Laurice Record, MD      . bisacodyl (DULCOLAX) suppository 10 mg  10 mg Rectal Daily PRN Hooten, Illene Labrador, MD      . brimonidine (ALPHAGAN) 0.2 % ophthalmic solution 1 drop  1 drop Both Eyes BID AC & HS Hooten, Illene Labrador, MD   1 drop at 12/25/20 1001  . celecoxib (CELEBREX) capsule 200 mg  200 mg Oral BID Donato Heinz, MD   200 mg at 12/25/20 0955  . diphenhydrAMINE (BENADRYL) 12.5 MG/5ML elixir 12.5-25 mg  12.5-25 mg Oral Q4H PRN Hooten, Illene Labrador, MD      . dutasteride (AVODART) capsule 0.5 mg  0.5 mg Oral Daily Hooten, Illene Labrador, MD   0.5 mg at 12/25/20 0955  . enoxaparin (LOVENOX) injection 30 mg  30 mg Subcutaneous Q12H Hooten, Illene Labrador, MD   30 mg at 12/25/20 0816   . ferrous sulfate tablet 325 mg  325 mg Oral BID WC Hooten, Illene Labrador, MD   325 mg at 12/25/20 0815  . gabapentin (NEURONTIN) capsule 300 mg  300 mg Oral QHS Hooten, Illene Labrador, MD   300 mg at 12/24/20 2312  . hydrochlorothiazide (HYDRODIURIL) tablet 12.5 mg  12.5 mg Oral Daily Donato Heinz, MD   12.5 mg at 12/25/20 0955  . HYDROmorphone (DILAUDID) injection 0.5-1 mg  0.5-1 mg Intravenous Q4H PRN Hooten, Illene Labrador, MD      . ipratropium (ATROVENT) 0.06 % nasal spray 1 spray  1 spray Each Nare BID PRN Hooten, Illene Labrador, MD      . latanoprost (XALATAN) 0.005 % ophthalmic solution 1 drop  1 drop Both Eyes QHS Hooten, Illene Labrador, MD   1 drop at 12/25/20 0001  . levothyroxine (SYNTHROID) tablet 125 mcg  125 mcg Oral QAC breakfast Donato Heinz, MD   125 mcg at 12/25/20 0501  . loratadine (CLARITIN) tablet 10 mg  10 mg Oral QPM Belue, Lendon Collar, RPH      . magnesium hydroxide (MILK OF MAGNESIA) suspension 30 mL  30 mL Oral Daily Hooten, Illene Labrador, MD   30 mL at 12/25/20 0957  . menthol-cetylpyridinium (CEPACOL) lozenge 3 mg  1 lozenge Oral PRN Hooten, Illene Labrador, MD       Or  . phenol (CHLORASEPTIC) mouth spray 1 spray  1 spray Mouth/Throat PRN Hooten, Illene Labrador, MD      . metoCLOPramide (REGLAN) tablet 10 mg  10 mg Oral TID AC & HS Hooten, Illene Labrador, MD   10 mg at 12/25/20 0815  . multivitamin with minerals tablet 1 tablet  1 tablet Oral Daily Hooten, Illene Labrador, MD   1 tablet at 12/25/20 0955  . ondansetron (ZOFRAN) tablet 4 mg  4 mg Oral Q6H PRN Hooten, Illene Labrador, MD       Or  . ondansetron (ZOFRAN) injection 4 mg  4 mg Intravenous Q6H PRN Hooten, Illene Labrador, MD      . oxyCODONE (Oxy IR/ROXICODONE) immediate release tablet 5-10 mg  5-10 mg Oral Q4H PRN Hooten, Illene Labrador, MD      . pantoprazole (PROTONIX) EC tablet 40 mg  40 mg Oral BID Donato Heinz, MD   40 mg at 12/25/20 0955  . pravastatin (PRAVACHOL) tablet 40 mg  40 mg Oral q1800 Hooten, Illene Labrador, MD      . senna-docusate (Senokot-S) tablet 1 tablet  1 tablet Oral BID  Donato Heinz, MD   1 tablet at 12/25/20 0955  . sodium phosphate (FLEET) 7-19 GM/118ML enema 1 enema  1 enema Rectal Once PRN Hooten, Illene Labrador, MD      .  traMADol (ULTRAM) tablet 50-100 mg  50-100 mg Oral Q4H PRN Hooten, Illene Labrador, MD         Discharge Medications: Please see discharge summary for a list of discharge medications.  Relevant Imaging Results:  Relevant Lab Results:   Additional Information SS# [id number]  Trenton Founds, RN

## 2020-12-25 NOTE — Evaluation (Addendum)
Physical Therapy Evaluation Patient Details Name: Daniel Carr MRN: [id number] DOB: 1935/04/05 Today's Date: 12/25/2020   History of Present Illness  Patient is a 85 year old male with degenerative arthrosis of the left knee s/p left total knee arthroplasty 12/24/20. Past medical history of right total knee arthroplasty 2012, hypertension, osteoarthritis, eye surgery, detached retina, glaucoma  Clinical Impression  Patient eager to participate with PT. Patient is usually independent with mobility with occasional use of cane with ambulation at baseline. Patient lives with spouse who requires 24 hour supervision/assistance due to dementia (daughter is the caregiver for spouse). Patient reports 4/10 pain left knee. Patient required Min guard assistance for bed mobility and Min A for standing for lifting assistance. Patient ambulates a short distance in room with rolling walker with Min A and relying heavily on rolling walker for UE support in standing position with decreased overall standing tolerance of less than 3 minutes. Recommend to continue PT services to maximize independence and address functional limitations following knee surgery. Also recommend SNF placement at discharge due to decreased caregiver assistance at home and ongoing PT needs.     Follow Up Recommendations SNF    Equipment Recommendations  Rolling walker with 5" wheels    Recommendations for Other Services       Precautions / Restrictions Precautions Precautions: Knee Precaution Booklet Issued: Yes (comment) Required Braces or Orthoses:  (KI not required as patient is able to SLR x 10 reps on LLE independently) Restrictions Weight Bearing Restrictions: Yes LLE Weight Bearing: Weight bearing as tolerated      Mobility  Bed Mobility Overal bed mobility: Needs Assistance Bed Mobility: Supine to Sit     Supine to sit: Min guard     General bed mobility comments: extra time required to complete tasks.     Transfers Overall transfer level: Needs assistance Equipment used: Rolling walker (2 wheeled) Transfers: Sit to/from Stand Sit to Stand: Min assist         General transfer comment: verbal cues for positioning, hand placement, and technique for safe transfers. lifting assistance required for standing. no dizziness is reported with activity  Ambulation/Gait Ambulation/Gait assistance: Min assist Gait Distance (Feet): 3 Feet Assistive device: Rolling walker (2 wheeled) Gait Pattern/deviations: Antalgic;Decreased step length - left;Decreased stance time - left Gait velocity: decreased   General Gait Details: verbal cues for technique using rolling walker for UE support. patient relying heavily on rolling walker for support. further ambulation deferred by the patient.  Stairs            Wheelchair Mobility    Modified Rankin (Stroke Patients Only)       Balance Overall balance assessment: Needs assistance Sitting-balance support: No upper extremity supported;Feet supported Sitting balance-Leahy Scale: Good     Standing balance support: Bilateral upper extremity supported Standing balance-Leahy Scale: Fair Standing balance comment: patient relying heavily on rolling walker for UE support in standing                             Pertinent Vitals/Pain Pain Assessment: 0-10 Pain Score: 4  Pain Location: left knee Pain Descriptors / Indicators: Sore Pain Intervention(s): Monitored during session;Repositioned;Limited activity within patient's tolerance    Home Living Family/patient expects to be discharged to:: Private residence Living Arrangements: Spouse/significant other (daughter) Available Help at Discharge:  (daughter is the cargiver for the spouse who has dementia. patient will not have adequate support) Type of Home: House Home Access:  (  one step up)     Home Layout: One level Home Equipment: Cane - single point;Shower seat;Grab bars -  tub/shower      Prior Function Level of Independence: Independent with assistive device(s)         Comments: intermittent use of single point cane, otherwise independent with activity, ADL, and drives     Hand Dominance        Extremity/Trunk Assessment   Upper Extremity Assessment Upper Extremity Assessment: Overall WFL for tasks assessed    Lower Extremity Assessment Lower Extremity Assessment: LLE deficits/detail;RLE deficits/detail RLE Deficits / Details: WNL RLE Sensation: WNL LLE Deficits / Details: patient able to activate hip, knee, ankle movement in antigravity position. patient able to perform quad set and perform 10 SLR independently LLE Sensation: WNL       Communication   Communication: No difficulties  Cognition Arousal/Alertness: Awake/alert Behavior During Therapy: WFL for tasks assessed/performed Overall Cognitive Status: Within Functional Limits for tasks assessed                                        General Comments      Exercises Total Joint Exercises Quad Sets: AROM;Strengthening;10 reps;Supine;Both Straight Leg Raises: AROM;Strengthening;Left;10 reps;Supine Goniometric ROM: left knee 5-79 degrees Other Exercises Other Exercises: verbal cues for exercise technique   Assessment/Plan    PT Assessment Patient needs continued PT services  PT Problem List Decreased strength;Decreased range of motion;Decreased balance;Decreased mobility;Decreased activity tolerance;Decreased knowledge of use of DME;Decreased cognition;Decreased safety awareness;Pain       PT Treatment Interventions DME instruction;Gait training;Stair training;Functional mobility training;Therapeutic activities;Balance training;Therapeutic exercise;Patient/family education    PT Goals (Current goals can be found in the Care Plan section)  Acute Rehab PT Goals Patient Stated Goal: to go to rehab at discharge PT Goal Formulation: With patient Time For Goal  Achievement: 01/08/21 Potential to Achieve Goals: Good Additional Goals Additional Goal #1: patient will increase AROM left knee flexion to 90 degrees for improved gait pattern    Frequency BID   Barriers to discharge Decreased caregiver support patient will not have caregiver support as his daughter is the primary caregiver for the spouse (has dementia) and needs 24 hour supervision/assistance at home    Co-evaluation               AM-PAC PT "6 Clicks" Mobility  Outcome Measure Help needed turning from your back to your side while in a flat bed without using bedrails?: A Little Help needed moving from lying on your back to sitting on the side of a flat bed without using bedrails?: A Little Help needed moving to and from a bed to a chair (including a wheelchair)?: A Little Help needed standing up from a chair using your arms (e.g., wheelchair or bedside chair)?: A Little Help needed to walk in hospital room?: A Little Help needed climbing 3-5 steps with a railing? : A Lot 6 Click Score: 17    End of Session Equipment Utilized During Treatment: Gait belt Activity Tolerance: Patient tolerated treatment well Patient left: in chair;with call bell/phone within reach;with chair alarm set (OT present in room for evaluation at end of session) Nurse Communication: Mobility status PT Visit Diagnosis: Muscle weakness (generalized) (M62.81);Other abnormalities of gait and mobility (R26.89);Pain Pain - Right/Left: Left Pain - part of body: Knee    Time: 8469-6295 PT Time Calculation (min) (ACUTE ONLY): 22 min  Charges:   PT Evaluation $PT Eval Moderate Complexity: 1 Mod PT Treatments $Therapeutic Exercise: 8-22 mins        Minna Merritts, PT, MPT   Percell Locus 12/25/2020, 9:11 AM

## 2020-12-26 NOTE — Progress Notes (Signed)
Occupational Therapy Treatment Patient Details Name: Daniel Carr MRN: [id number] DOB: [birth year] Today's Date: 12/26/2020    History of present illness Patient is a 85 year old male with degenerative arthrosis of the left knee s/p left total knee arthroplasty 12/24/20. Past medical history of right total knee arthroplasty 2012, hypertension, osteoarthritis, eye syrgery, detached retina, glaucoma   OT comments  Pt. education was provided about A/E use for LE ADLs. Pt. Required CGA using the reacher for donning short, and doffing socks while seated unsupported at the EOB. Pt. required  MinA to stand from the EOB, and hike shorts. Pt. Was assisted with problem-solving through anticipated needs upon return home. Pt. Continues to benefit from OT services for ADL training, A/E training, and pt. Education about home modification, and DME. Pt. would benefit from SNF level of care upon discharge, with follow-up OT services.   Follow Up Recommendations  SNF    Equipment Recommendations       Recommendations for Other Services      Precautions / Restrictions Precautions Precautions: Knee Restrictions Weight Bearing Restrictions: Yes LLE Weight Bearing: Weight bearing as tolerated       Mobility Bed Mobility Overal bed mobility: Needs Assistance       Supine to sit: Supervision Sit to supine: Min guard      Transfers Overall transfer level: Needs assistance   Transfers: Sit to/from Stand Sit to Stand: Min guard;From elevated surface              Balance Overall balance assessment: Needs assistance   Sitting balance-Leahy Scale: Good     Standing balance support: Bilateral upper extremity supported Standing balance-Leahy Scale: Fair                             ADL either performed or assessed with clinical judgement   ADL Overall ADL's : Needs assistance/impaired                                       General ADL Comments: Pt. requires  CGA for using the reaching from LE dressing. MinA standing to hike shorts.     Vision Baseline Vision/History: Wears glasses Wears Glasses: At all times Patient Visual Report: No change from baseline     Perception     Praxis      Cognition Arousal/Alertness: Awake/alert Behavior During Therapy: WFL for tasks assessed/performed Overall Cognitive Status: Within Functional Limits for tasks assessed                                          Exercises     Shoulder Instructions       General Comments      Pertinent Vitals/ Pain       Pain Assessment: 0-10 Pain Score: 2  Pain Location: left knee Pain Descriptors / Indicators: Sore Pain Intervention(s): Monitored during session  Home Living                                          Prior Functioning/Environment              Frequency  Min 1X/week  Progress Toward Goals  OT Goals(current goals can now be found in the care plan section)  Progress towards OT goals: Progressing toward goals  Acute Rehab OT Goals Patient Stated Goal: to go to rehab at discharge OT Goal Formulation: With patient Time For Goal Achievement: 01/08/21 Potential to Achieve Goals: Good  Plan      Co-evaluation                 AM-PAC OT "6 Clicks" Daily Activity     Outcome Measure   Help from another person eating meals?: None Help from another person taking care of personal grooming?: None Help from another person toileting, which includes using toliet, bedpan, or urinal?: A Little Help from another person bathing (including washing, rinsing, drying)?: A Lot Help from another person to put on and taking off regular upper body clothing?: None Help from another person to put on and taking off regular lower body clothing?: A Little 6 Click Score: 20    End of Session Equipment Utilized During Treatment: Gait belt  OT Visit Diagnosis: Other abnormalities of gait and mobility  (R26.89);Muscle weakness (generalized) (M62.81)   Activity Tolerance Patient tolerated treatment well   Patient Left in bed;with bed alarm set   Nurse Communication          Time: 1105-1130 OT Time Calculation (min): 25 min  Charges: OT General Charges $OT Visit: 1 Visit OT Treatments $Self Care/Home Management : 23-37 mins  [name], MS, OTR/L  [name] 12/26/2020, 1:19 PM

## 2020-12-26 NOTE — Plan of Care (Signed)
  Problem: Pain Managment: Goal: General experience of comfort will improve Outcome: Progressing   Problem: Safety: Goal: Ability to remain free from injury will improve Outcome: Progressing   Problem: Skin Integrity: Goal: Risk for impaired skin integrity will decrease Outcome: Progressing   

## 2020-12-26 NOTE — Progress Notes (Signed)
Physical Therapy Treatment Patient Details Name: Daniel Carr MRN: [id number] DOB: 1935-09-22 Today's Date: 12/26/2020    History of Present Illness Patient is a 85 year old male with degenerative arthrosis of the left knee s/p left total knee arthroplasty 12/24/20. Past medical history of right total knee arthroplasty 2012, hypertension, osteoarthritis, eye syrgery, detached retina, glaucoma    PT Comments    Continued progression in gait distance, tolerating up to 21' with RW, cga; remains generally limited by fatigue with exertional activities.   Follow Up Recommendations  SNF     Equipment Recommendations  Rolling walker with 5" wheels    Recommendations for Other Services       Precautions / Restrictions Precautions Precautions: Knee;Fall Restrictions Weight Bearing Restrictions: Yes LLE Weight Bearing: Weight bearing as tolerated    Mobility  Bed Mobility Overal bed mobility: Modified Independent Bed Mobility: Supine to Sit;Sit to Supine     Supine to sit: Modified independent (Device/Increase time) Sit to supine: Modified independent (Device/Increase time)   General bed mobility comments: good strength/control to L LE  Transfers Overall transfer level: Needs assistance Equipment used: Rolling walker (2 wheeled) Transfers: Sit to/from Stand Sit to Stand: Min guard         General transfer comment: does require UE support, multiple attempts and use of momentum to complete sit/stand  Ambulation/Gait Ambulation/Gait assistance: Min guard Gait Distance (Feet): 75 Feet Assistive device: Rolling walker (2 wheeled)       General Gait Details: reciprocal stepping pattern, limited heel strike/toe off bilat; min cuing for walker position (often maintains too close to BOS) and postural extension with gait.  Gradual improvement in tolerance/distance   Stairs             Wheelchair Mobility    Modified Rankin (Stroke Patients Only)       Balance  Overall balance assessment: Needs assistance Sitting-balance support: No upper extremity supported;Feet supported Sitting balance-Leahy Scale: Good     Standing balance support: Bilateral upper extremity supported Standing balance-Leahy Scale: Fair Standing balance comment: patient relying heavily on rolling walker for UE support in standing                            Cognition Arousal/Alertness: Awake/alert Behavior During Therapy: WFL for tasks assessed/performed Overall Cognitive Status: Within Functional Limits for tasks assessed                                        Exercises Total Joint Exercises Goniometric ROM: L knee: 3-90 degrees Other Exercises Other Exercises: Toilet transfer, ambulatory with RW, cga/close sup; sit/stand from raised BSC with RW, close sup; standing balance at sink for hand hygiene, close sp.  Good safety awareness/insight with functional activities.    General Comments        Pertinent Vitals/Pain Pain Assessment: 0-10 Pain Score: 2  Pain Location: left knee Pain Descriptors / Indicators: Aching;Sore Pain Intervention(s): Limited activity within patient's tolerance;Monitored during session;Repositioned    Home Living                      Prior Function            PT Goals (current goals can now be found in the care plan section) Acute Rehab PT Goals Patient Stated Goal: to go to rehab at discharge PT Goal Formulation:  With patient Time For Goal Achievement: 01/08/21 Potential to Achieve Goals: Good Progress towards PT goals: Progressing toward goals    Frequency    BID      PT Plan Current plan remains appropriate    Co-evaluation              AM-PAC PT "6 Clicks" Mobility   Outcome Measure  Help needed turning from your back to your side while in a flat bed without using bedrails?: None Help needed moving from lying on your back to sitting on the side of a flat bed without using  bedrails?: None Help needed moving to and from a bed to a chair (including a wheelchair)?: A Little Help needed standing up from a chair using your arms (e.g., wheelchair or bedside chair)?: A Little Help needed to walk in hospital room?: A Little Help needed climbing 3-5 steps with a railing? : A Little 6 Click Score: 20    End of Session Equipment Utilized During Treatment: Gait belt Activity Tolerance: Patient tolerated treatment well Patient left: with call bell/phone within reach;in bed;with bed alarm set Nurse Communication: Mobility status PT Visit Diagnosis: Muscle weakness (generalized) (M62.81);Other abnormalities of gait and mobility (R26.89);Pain Pain - Right/Left: Left Pain - part of body: Knee     Time: 2876-8115 PT Time Calculation (min) (ACUTE ONLY): 20 min  Charges:  $Gait Training: 8-22 mins $Therapeutic Exercise: 8-22 mins $Therapeutic Activity: 8-22 mins                     Nadirah Socorro H. Manson Passey, PT, DPT, NCS 12/26/20, 5:10 PM [phone]

## 2020-12-26 NOTE — Progress Notes (Signed)
Physical Therapy Treatment Patient Details Name: Daniel Carr MRN: [id number] DOB: [birth year] Today's Date: 12/26/2020    History of Present Illness Patient is a 85 year old male with degenerative arthrosis of the left knee s/p left total knee arthroplasty 12/24/20. Past medical history of right total knee arthroplasty 2012, hypertension, osteoarthritis, eye syrgery, detached retina, glaucoma    PT Comments    Gradual improvement in activity tolerance, gait distance (50') this date. Able to complete all transfers and mobility with RW, no greater than cga level of assist; improved comfort/confidence with movement noted. Demonstrates good LE strength and control with isolated therex and with functional activities; good improvement in ROM.    Follow Up Recommendations  SNF     Equipment Recommendations  Rolling walker with 5" wheels    Recommendations for Other Services       Precautions / Restrictions Precautions Precautions: Knee;Fall Restrictions Weight Bearing Restrictions: Yes LLE Weight Bearing: Weight bearing as tolerated    Mobility  Bed Mobility Overal bed mobility: Modified Independent       Supine to sit: Modified independent (Device/Increase time) Sit to supine: Min guard      Transfers Overall transfer level: Needs assistance Equipment used: Rolling walker (2 wheeled) Transfers: Sit to/from Stand Sit to Stand: Min guard         General transfer comment: multiple attempts for lift off, increased time/effort for anterior weight translation to bring COG over BOS  Ambulation/Gait Ambulation/Gait assistance: Min guard Gait Distance (Feet): 50 Feet Assistive device: Rolling walker (2 wheeled)       General Gait Details: reciprocal stepping pattern, limited heel strike/toe off bilat; min cuing for walker position (often maintains too close to BOS) and postural extension with gait.  Distance limited by fatigue.   Stairs             Wheelchair  Mobility    Modified Rankin (Stroke Patients Only)       Balance Overall balance assessment: Needs assistance Sitting-balance support: No upper extremity supported;Feet supported Sitting balance-Leahy Scale: Good     Standing balance support: Bilateral upper extremity supported Standing balance-Leahy Scale: Fair Standing balance comment: patient relying heavily on rolling walker for UE support in standing                            Cognition Arousal/Alertness: Awake/alert Behavior During Therapy: WFL for tasks assessed/performed Overall Cognitive Status: Within Functional Limits for tasks assessed                                        Exercises Total Joint Exercises Goniometric ROM: L knee: 3-90 degrees Other Exercises Other Exercises: Seated LE therex, 2x15, active ROM for muscular strength/endurance.    General Comments        Pertinent Vitals/Pain Pain Assessment: 0-10 Pain Score: 2  Pain Location: left knee Pain Descriptors / Indicators: Aching;Sore Pain Intervention(s): Limited activity within patient's tolerance;Monitored during session;Premedicated before session;Repositioned    Home Living                      Prior Function            PT Goals (current goals can now be found in the care plan section) Acute Rehab PT Goals Patient Stated Goal: to go to rehab at discharge PT Goal Formulation: With patient  Time For Goal Achievement: 01/08/21 Potential to Achieve Goals: Good Progress towards PT goals: Progressing toward goals    Frequency    BID      PT Plan Current plan remains appropriate    Co-evaluation              AM-PAC PT "6 Clicks" Mobility   Outcome Measure  Help needed turning from your back to your side while in a flat bed without using bedrails?: None Help needed moving from lying on your back to sitting on the side of a flat bed without using bedrails?: None Help needed moving to and  from a bed to a chair (including a wheelchair)?: A Little Help needed standing up from a chair using your arms (e.g., wheelchair or bedside chair)?: A Little Help needed to walk in hospital room?: A Little Help needed climbing 3-5 steps with a railing? : A Little 6 Click Score: 20    End of Session Equipment Utilized During Treatment: Gait belt Activity Tolerance: Patient tolerated treatment well Patient left: in chair;with call bell/phone within reach;with chair alarm set Nurse Communication: Mobility status PT Visit Diagnosis: Muscle weakness (generalized) (M62.81);Other abnormalities of gait and mobility (R26.89);Pain Pain - Right/Left: Left Pain - part of body: Knee     Time: 9811-9147 PT Time Calculation (min) (ACUTE ONLY): 23 min  Charges:  $Gait Training: 8-22 mins $Therapeutic Exercise: 8-22 mins                     Aqib Lough H. Manson Passey, PT, DPT, NCS 12/26/20, 4:59 PM [phone]

## 2020-12-26 NOTE — Discharge Summary (Signed)
Physician Discharge Summary  Patient ID: Daniel Carr MRN: [id number] DOB/AGE: 83-Apr-1936 83 y.o.  Admit date: 12/24/2020 Discharge date: 12/29/2020  Admission Diagnoses:  Total knee replacement status [Z96.659]  Surgeries:Procedure(s):  Left total knee arthroplasty using computer-assisted navigation  SURGEON:  Marciano Sequin. M.D.  ASSISTANT: Cassell Smiles, PA-C (present and scrubbed throughout the case, critical for assistance with exposure, retraction, instrumentation, and closure)  ANESTHESIA: spinal  ESTIMATED BLOOD LOSS: 50 mL  FLUIDS REPLACED: 1700 mL of crystalloid  TOURNIQUET TIME: 124 minutes  DRAINS: 2 medium Hemovac  SOFT TISSUE RELEASES: Anterior cruciate ligament, posterior cruciate ligament, deep medial collateral ligament, patellofemoral ligament  IMPLANTS UTILIZED: DePuy Attune size 8 posterior stabilized femoral component (cemented), size 9 rotating platform tibial component (cemented), 41 mm medialized dome patella (cemented), and a 12 mm stabilized rotating platform polyethylene insert.  Discharge Diagnoses: Patient Active Problem List   Diagnosis Date Noted  . Total knee replacement status 12/24/2020  . Systolic murmur 0000000  . Allergic rhinitis 02/19/2019  . Chronic bronchitis ([place]) 01/31/2019  . MGUS (monoclonal gammopathy of unknown significance) 11/11/2018  . Systolic dysfunction [id number]  . Primary osteoarthritis of left knee 07/28/2017  . Callus of foot 07/27/2016  . Nail dystrophy 07/27/2016  . Arthropathia 06/09/2015  . Neuropathic ulnar nerve 06/09/2015  . Glaucoma   . Hypothyroid   . Pure hypercholesterolemia   . Hypertension, benign   . Arthritis, multiple joint involvement   . BPH (benign prostatic hyperplasia)   . ED (erectile dysfunction)   . Nocturia associated with benign prostatic hypertrophy     Past Medical History:  Diagnosis Date  . Arthritis   . childhood infection    chicken pox, measels, and mumps   . Hypertension   . Hypothyroidism   . Sinus problem   . Ulnar neuropathy    bilateral     Transfusion:    Consultants (if any):   Discharged Condition: Improved  Hospital Course: Daniel Carr is an 85 y.o. male who was admitted 12/24/2020 with a diagnosis of left knee osteoarthritis and went to the operating room on 12/24/2020 and underwent left total knee arthroplasty. The patient received perioperative antibiotics for prophylaxis (see below). The patient tolerated the procedure well and was transported to PACU in stable condition. After meeting PACU criteria, the patient was subsequently transferred to the Orthopaedics/Rehabilitation unit.   The patient received DVT prophylaxis in the form of early mobilization, Lovenox, Foot Pumps and TED hose. A sacral pad had been placed and heels were elevated off of the bed with rolled towels in order to protect skin integrity. Foley catheter was discontinued on postoperative day #0. Wound drains were discontinued on postoperative day #2. The surgical incision was healing well without signs of infection.  Physical therapy was initiated postoperatively for transfers, gait training, and strengthening. Occupational therapy was initiated for activities of daily living and evaluation for assisted devices. Rehabilitation goals were reviewed in detail with the patient. The patient made steady progress with physical therapy and physical therapy recommended discharge to Skilled nursing facility.   The patient achieved the preliminary goals of this hospitalization and was felt to be medically and orthopaedically appropriate for discharge.  He was given perioperative antibiotics:  Anti-infectives (From admission, onward)   Start     Dose/Rate Route Frequency Ordered Stop   12/24/20 2300  ceFAZolin (ANCEF) IVPB 2g/100 mL premix        2 g 200 mL/hr over 30 Minutes Intravenous Every 6 hours 12/24/20 2212 12/25/20  0602   12/24/20 1403  ceFAZolin (ANCEF) 2-4  GM/100ML-% IVPB       Note to Pharmacy: Mikey Bussing   : cabinet override      12/24/20 1403 12/24/20 1722   12/24/20 0600  ceFAZolin (ANCEF) IVPB 2g/100 mL premix        2 g 200 mL/hr over 30 Minutes Intravenous On call to O.R. 12/24/20 0231 12/24/20 1728    .  Recent vital signs:  Vitals:   12/29/20 0044 12/29/20 0420  BP: 100/66 97/70  Pulse: 89 78  Resp:  17  Temp: 98.5 F (36.9 C) 98.3 F (36.8 C)  SpO2: 95% 96%    Recent laboratory studies:  No results for input(s): WBC, HGB, HCT, PLT, K, CL, CO2, BUN, CREATININE, GLUCOSE, CALCIUM, LABPT, INR in the last 72 hours.  Diagnostic Studies: DG Knee Left Port  Result Date: 12/24/2020 CLINICAL DATA:  Left knee replacement EXAM: PORTABLE LEFT KNEE - 1-2 VIEW COMPARISON:  None. FINDINGS: The patient is status post left total knee arthroplasty. No periprosthetic lucency or fracture seen. Overlying surgical drain and surgical staples are noted. IMPRESSION: Status post left total knee arthroplasty without acute complication. Electronically Signed   By: Jonna Clark M.D.   On: 12/24/2020 21:57    Discharge Medications:   Allergies as of 12/29/2020   No Known Allergies     Medication List    STOP taking these medications   aspirin 81 MG tablet     TAKE these medications   amLODipine 2.5 MG tablet Commonly known as: NORVASC Take 1 tablet by mouth once daily   azelastine 0.1 % nasal spray Commonly known as: ASTELIN Place 1 spray into the nose daily as needed (allergies).   bimatoprost 0.01 % Soln Commonly known as: LUMIGAN Place 1 drop into both eyes at bedtime.   brimonidine 0.2 % ophthalmic solution Commonly known as: ALPHAGAN Place 1 drop into both eyes in the morning and at bedtime.   celecoxib 200 MG capsule Commonly known as: CELEBREX Take 1 capsule (200 mg total) by mouth 2 (two) times daily.   COD LIVER OIL PO Take 1 capsule by mouth daily.   DAILY VITAMIN PO Take 1 tablet by mouth daily.   dutasteride  0.5 MG capsule Commonly known as: AVODART Take 1 capsule by mouth once daily   enoxaparin 40 MG/0.4ML injection Commonly known as: LOVENOX Inject 0.4 mLs (40 mg total) into the skin daily for 14 days.   hydrochlorothiazide 25 MG tablet Commonly known as: HYDRODIURIL TAKE 1/2 (ONE-HALF) TABLET BY MOUTH ONCE DAILY . APPOINTMENT REQUIRED FOR FUTURE REFILLS What changed: See the new instructions.   ipratropium 0.06 % nasal spray Commonly known as: ATROVENT Place 1 spray into both nostrils 2 (two) times daily as needed for rhinitis.   levocetirizine 5 MG tablet Commonly known as: XYZAL Take 5 mg by mouth every evening.   lovastatin 40 MG tablet Commonly known as: MEVACOR Take 1 tablet by mouth once daily   oxyCODONE 5 MG immediate release tablet Commonly known as: Oxy IR/ROXICODONE Take 1 tablet (5 mg total) by mouth every 4 (four) hours as needed for severe pain.   Synthroid 125 MCG tablet Generic drug: levothyroxine Take 1 tablet (125 mcg total) by mouth daily. What changed: when to take this   traMADol 50 MG tablet Commonly known as: ULTRAM Take 1 tablet (50 mg total) by mouth every 4 (four) hours as needed for moderate pain.   Travoprost (BAK Free) 0.004 %  Soln ophthalmic solution Commonly known as: TRAVATAN Apply 1 drop to eye at bedtime.            Durable Medical Equipment  (From admission, onward)         Start     Ordered   12/24/20 2213  DME Walker rolling  Once       Question:  Patient needs a walker to treat with the following condition  Answer:  Total knee replacement status   12/24/20 2212   12/24/20 2213  DME Bedside commode  Once       Question:  Patient needs a bedside commode to treat with the following condition  Answer:  Total knee replacement status   12/24/20 2212          Disposition: SNF     Follow-up Information    [facility] On 01/08/2021.   Specialty: Orthopedic Surgery Why: at 1:45pm Contact information: [place] [address] [phone]        Dereck Leep, MD On 02/05/2021.   Specialty: Orthopedic Surgery Why: at 2:30pm Contact information: [place] [phone] [place], PA-C 12/29/2020, 7:20 AM

## 2020-12-26 NOTE — Care Management Important Message (Signed)
Important Message  Patient Details  Name: Daniel Carr MRN: [id number] Date of Birth: January 12, 1935   Medicare Important Message Given:  N/A - LOS <3 / Initial given by admissions     [name] A Rayan Ines 12/26/2020, 8:05 AM

## 2020-12-26 NOTE — Progress Notes (Signed)
  Subjective: 2 Days Post-Op Procedure(s) (LRB): COMPUTER ASSISTED TOTAL KNEE ARTHROPLASTY (Left) Patient reports pain as mild.   Patient is well, and has had no acute complaints or problems Plan is for discharge to SNF when appropriate. Negative for chest pain and shortness of breath Fever: no Gastrointestinal:negative for nausea and vomiting, patient is passing gas without pain.  Objective: Vital signs in last 24 hours: Temp:  [97.8 F (36.6 C)-98.3 F (36.8 C)] 98.3 F (36.8 C) (12/31 0818) Pulse Rate:  [76-95] 82 (12/31 0818) Resp:  [16-17] 17 (12/31 0818) BP: (94-136)/(65-81) 136/81 (12/31 0818) SpO2:  [95 %-99 %] 99 % (12/31 0818)  Intake/Output from previous day:  Intake/Output Summary (Last 24 hours) at 12/26/2020 0901 Last data filed at 12/26/2020 0617 Gross per 24 hour  Intake 1526.17 ml  Output 2245 ml  Net -718.83 ml    Intake/Output this shift: No intake/output data recorded.  Labs: No results for input(s): HGB in the last 72 hours. No results for input(s): WBC, RBC, HCT, PLT in the last 72 hours. No results for input(s): NA, K, CL, CO2, BUN, CREATININE, GLUCOSE, CALCIUM in the last 72 hours. No results for input(s): LABPT, INR in the last 72 hours.   EXAM General - Patient is Alert, Appropriate and Oriented Extremity - ABD soft Sensation intact distally Intact pulses distally Dorsiflexion/Plantar flexion intact Dressing/Incision - Bulky dressing removed, hemovac removed this AM. Motor Function - intact, moving foot and toes well on exam.  Abdomen soft with normal bowel sounds.  Past Medical History:  Diagnosis Date  . Arthritis   . childhood infection    chicken pox, measels, and mumps  . Hypertension   . Hypothyroidism   . Sinus problem   . Ulnar neuropathy    bilateral    Assessment/Plan: 2 Days Post-Op Procedure(s) (LRB): COMPUTER ASSISTED TOTAL KNEE ARTHROPLASTY (Left) Active Problems:   Total knee replacement status  Estimated  body mass index is 27.44 kg/m as calculated from the following:   Height as of this encounter: 6\' 1"  (1.854 m).   Weight as of this encounter: 94.3 kg. Advance diet Up with therapy   Patient was only able to walk 35 feet yesterday with PT.  Continue with PT today. Patient is passing gas but has not had a BM yet. Patient would like to go to SNF due to support at home. Bulky dressing was removed this AM, Hemovac removed.  Will place order for TED Hose stockings for the left leg.  DVT Prophylaxis - Lovenox, Foot Pumps and TED hose Weight-Bearing as tolerated to left leg  J. , PA-C [facility]opa[facility] 12/26/2020, 9:01 AM

## 2020-12-26 NOTE — Progress Notes (Signed)
Patient tried bone foam. He didn't like it. Stated it was awful and painful. Patient refused to wear it again. Heel elevated with towel instead.

## 2020-12-26 NOTE — Progress Notes (Signed)
Patient refused bisacodyl states he  Will take prune juice in the morning

## 2020-12-27 MED ORDER — CELECOXIB 200 MG PO CAPS: 200.0000 mg | ORAL_CAPSULE | Freq: Two times a day (BID) | ORAL | 0 refills | Status: DC

## 2020-12-27 MED ORDER — ENOXAPARIN SODIUM 40 MG/0.4ML ~~LOC~~ SOLN: 40.0000 mg | SUBCUTANEOUS | 0 refills | Status: DC

## 2020-12-27 NOTE — Progress Notes (Signed)
  Subjective: 3 Days Post-Op Procedure(s) (LRB): COMPUTER ASSISTED TOTAL KNEE ARTHROPLASTY (Left) Patient reports pain as mild.   Patient is well, and has had no acute complaints or problems Plan is for discharge to SNF when appropriate. Negative for chest pain and shortness of breath Fever: no Gastrointestinal:negative for nausea and vomiting, patient is passing gas without pain.  Has not had a BM yet.  Objective: Vital signs in last 24 hours: Temp:  [97.8 F (36.6 C)-98.1 F (36.7 C)] 97.8 F (36.6 C) (01/01 0757) Pulse Rate:  [72-89] 78 (01/01 0757) Resp:  [17-20] 19 (01/01 0757) BP: (94-127)/(62-82) 127/82 (01/01 0757) SpO2:  [97 %-100 %] 100 % (01/01 0757)  Intake/Output from previous day:  Intake/Output Summary (Last 24 hours) at 12/27/2020 0836 Last data filed at 12/27/2020 0616 Gross per 24 hour  Intake --  Output 1600 ml  Net -1600 ml    Intake/Output this shift: No intake/output data recorded.  Labs: No results for input(s): HGB in the last 72 hours. No results for input(s): WBC, RBC, HCT, PLT in the last 72 hours. No results for input(s): NA, K, CL, CO2, BUN, CREATININE, GLUCOSE, CALCIUM in the last 72 hours. No results for input(s): LABPT, INR in the last 72 hours.   EXAM General - Patient is Alert, Appropriate and Oriented Extremity - ABD soft Sensation intact distally Intact pulses distally Dorsiflexion/Plantar flexion intact Incision: dressing C/D/I Dressing/Incision - Honeycomb dressing intact without drainage. Motor Function - intact, moving foot and toes well on exam.  Abdomen soft with normal bowel sounds.  Past Medical History:  Diagnosis Date  . Arthritis   . childhood infection    chicken pox, measels, and mumps  . Hypertension   . Hypothyroidism   . Sinus problem   . Ulnar neuropathy    bilateral    Assessment/Plan: 3 Days Post-Op Procedure(s) (LRB): COMPUTER ASSISTED TOTAL KNEE ARTHROPLASTY (Left) Active Problems:   Total knee  replacement status  Estimated body mass index is 27.44 kg/m as calculated from the following:   Height as of this encounter: 6\' 1"  (1.854 m).   Weight as of this encounter: 94.3 kg. Advance diet Up with therapy   Patient was only able to walk 75 feet yesterday with PT.  Continue with PT today. Patient is passing gas but has not had a BM yet. Patient would like to go to SNF due to support at home.  Discharge to SNF after insurance auth and having a BM. TED hose in place to the left leg.  DVT Prophylaxis - Lovenox, Foot Pumps and TED hose Weight-Bearing as tolerated to left leg  J. , PA-C [facility] Orthopa[facility] 12/27/2020, 8:36 AM

## 2020-12-27 NOTE — Progress Notes (Signed)
Physical Therapy Treatment Patient Details Name: Daniel Carr MRN: [id number] DOB: 1935-08-02 Today's Date: 12/27/2020    History of Present Illness Patient is a 86 year old male with degenerative arthrosis of the left knee s/p left total knee arthroplasty 12/24/20. Past medical history of right total knee arthroplasty 2012, hypertension, osteoarthritis, eye syrgery, detached retina, glaucoma    PT Comments    Participated in exercises as described below.  Progressed gait 100' with min guard.  Remained in recliner after session.  Follow Up Recommendations  SNF     Equipment Recommendations  Rolling walker with 5" wheels    Recommendations for Other Services       Precautions / Restrictions Precautions Precautions: Knee;Fall Restrictions Weight Bearing Restrictions: Yes LLE Weight Bearing: Weight bearing as tolerated    Mobility  Bed Mobility Overal bed mobility: Modified Independent Bed Mobility: Supine to Sit     Supine to sit: Modified independent (Device/Increase time)        Transfers Overall transfer level: Needs assistance Equipment used: Rolling walker (2 wheeled) Transfers: Sit to/from Stand Sit to Stand: Min guard            Ambulation/Gait Ambulation/Gait assistance: Land (Feet): 110 Feet Assistive device: Rolling walker (2 wheeled) Gait Pattern/deviations: Step-to pattern;Decreased stance time - left;Decreased step length - left Gait velocity: decreased       Stairs             Wheelchair Mobility    Modified Rankin (Stroke Patients Only)       Balance Overall balance assessment: Needs assistance Sitting-balance support: No upper extremity supported;Feet supported Sitting balance-Leahy Scale: Good     Standing balance support: Bilateral upper extremity supported Standing balance-Leahy Scale: Fair                              Cognition Arousal/Alertness: Awake/alert Behavior During Therapy:  WFL for tasks assessed/performed Overall Cognitive Status: Within Functional Limits for tasks assessed                                        Exercises Total Joint Exercises Quad Sets: AROM;Strengthening;Left;10 reps;Supine Short Arc Quad: AAROM;Strengthening;Left;10 reps;Supine Heel Slides: AAROM;Strengthening;Left;10 reps;Supine Hip ABduction/ADduction: AAROM;Strengthening;Left;10 reps;Supine Straight Leg Raises: AROM;Strengthening;Left;10 reps;Supine Goniometric ROM: 3-93    General Comments        Pertinent Vitals/Pain Pain Assessment: Faces Faces Pain Scale: Hurts a little bit Pain Location: left knee Pain Descriptors / Indicators: Aching;Sore Pain Intervention(s): Limited activity within patient's tolerance;Monitored during session;Repositioned;Ice applied    Home Living                      Prior Function            PT Goals (current goals can now be found in the care plan section) Progress towards PT goals: Progressing toward goals    Frequency    BID      PT Plan Current plan remains appropriate    Co-evaluation              AM-PAC PT "6 Clicks" Mobility   Outcome Measure  Help needed turning from your back to your side while in a flat bed without using bedrails?: None Help needed moving from lying on your back to sitting on the side of a flat bed without  using bedrails?: None Help needed moving to and from a bed to a chair (including a wheelchair)?: A Little Help needed standing up from a chair using your arms (e.g., wheelchair or bedside chair)?: A Little Help needed to walk in hospital room?: A Little Help needed climbing 3-5 steps with a railing? : A Little 6 Click Score: 20    End of Session Equipment Utilized During Treatment: Gait belt Activity Tolerance: Patient tolerated treatment well Patient left: with call bell/phone within reach;in chair;with chair alarm set Nurse Communication: Mobility status Pain -  Right/Left: Left Pain - part of body: Knee     Time: 1004-1017 PT Time Calculation (min) (ACUTE ONLY): 13 min  Charges:  $Gait Training: 8-22 mins                     Chesley Noon, PTA 12/27/20, 11:25 AM

## 2020-12-28 NOTE — Progress Notes (Signed)
Physical Therapy Treatment Patient Details Name: Daniel Carr MRN: [id number] DOB: 11/25/1935 Today's Date: 12/28/2020    History of Present Illness Patient is a 86 year old male with degenerative arthrosis of the left knee s/p left total knee arthroplasty 12/24/20. Past medical history of right total knee arthroplasty 2012, hypertension, osteoarthritis, eye syrgery, detached retina, glaucoma    PT Comments    Participated in exercises as described below.  Gt with min guard in hallway with self selected gait speed and distance 110'    Discussed discharge plan.  Pt is progressing towards goals.  Discussed option of going home with HHPT ve SNF. Pt stated his wife has dementia and daughter is primary caregiver.  He stated he did not want to put more on his daughter than she already does.  He is encouraged to talk with daughter to see if SNF is still wanted given his improving mobility.  He seems hesitant to consider other options at this time.  Will leave SNF recommendation in place at this time per his wishes.   Follow Up Recommendations  SNF     Equipment Recommendations  Rolling walker with 5" wheels    Recommendations for Other Services       Precautions / Restrictions Precautions Precautions: Knee;Fall Restrictions Weight Bearing Restrictions: Yes LLE Weight Bearing: Weight bearing as tolerated    Mobility  Bed Mobility Overal bed mobility: Modified Independent                Transfers Overall transfer level: Needs assistance Equipment used: Rolling walker (2 wheeled) Transfers: Sit to/from Stand Sit to Stand: Supervision            Ambulation/Gait Ambulation/Gait assistance: Min guard Gait Distance (Feet): 110 Feet Assistive device: Rolling walker (2 wheeled) Gait Pattern/deviations: Step-to pattern;Decreased stance time - left;Decreased step length - left Gait velocity: decreased       Stairs             Wheelchair Mobility    Modified  Rankin (Stroke Patients Only)       Balance Overall balance assessment: Needs assistance Sitting-balance support: No upper extremity supported;Feet supported Sitting balance-Leahy Scale: Good     Standing balance support: Bilateral upper extremity supported Standing balance-Leahy Scale: Fair                              Cognition Arousal/Alertness: Awake/alert Behavior During Therapy: WFL for tasks assessed/performed Overall Cognitive Status: Within Functional Limits for tasks assessed                                        Exercises Total Joint Exercises Quad Sets: AROM;Strengthening;Left;10 reps;Supine Short Arc Quad: AAROM;Strengthening;Left;10 reps;Supine Heel Slides: AAROM;Strengthening;Left;10 reps;Standing Hip ABduction/ADduction: AAROM;Strengthening;Left;10 reps;Supine Straight Leg Raises: AROM;Strengthening;Left;10 reps;Supine Goniometric ROM: 3-93    General Comments        Pertinent Vitals/Pain Pain Assessment: Faces Faces Pain Scale: Hurts little more Pain Location: left knee Pain Descriptors / Indicators: Aching;Sore Pain Intervention(s): Limited activity within patient's tolerance;Monitored during session;Repositioned;Ice applied    Home Living                      Prior Function            PT Goals (current goals can now be found in the care plan section) Progress towards PT  goals: Progressing toward goals    Frequency    BID      PT Plan Current plan remains appropriate    Co-evaluation              AM-PAC PT "6 Clicks" Mobility   Outcome Measure  Help needed turning from your back to your side while in a flat bed without using bedrails?: None Help needed moving from lying on your back to sitting on the side of a flat bed without using bedrails?: None Help needed moving to and from a bed to a chair (including a wheelchair)?: A Little Help needed standing up from a chair using your arms  (e.g., wheelchair or bedside chair)?: A Little Help needed to walk in hospital room?: A Little Help needed climbing 3-5 steps with a railing? : A Little 6 Click Score: 20    End of Session Equipment Utilized During Treatment: Gait belt Activity Tolerance: Patient tolerated treatment well Patient left: with call bell/phone within reach;in chair;with chair alarm set Nurse Communication: Mobility status Pain - Right/Left: Left Pain - part of body: Knee     Time: 7915-0569 PT Time Calculation (min) (ACUTE ONLY): 15 min  Charges:  $Gait Training: 8-22 mins                    Danielle Dess, PTA 12/28/20, 10:51 AM

## 2020-12-28 NOTE — Progress Notes (Signed)
Subjective: 4 Days Post-Op Procedure(s) (LRB): COMPUTER ASSISTED TOTAL KNEE ARTHROPLASTY (Left) Patient reports pain as mild.   Patient is well, and has had no acute complaints or problems Plan is for discharge to SNF when appropriate. Negative for chest pain and shortness of breath Fever: no Gastrointestinal:negative for nausea and vomiting, patient is passing gas without pain.  Has had a BM  Objective: Vital signs in last 24 hours: Temp:  [98.1 F (36.7 C)-98.8 F (37.1 C)] 98.2 F (36.8 C) (01/02 0726) Pulse Rate:  [78-94] 94 (01/02 0726) Resp:  [17-20] 17 (01/02 0726) BP: (115-120)/(58-73) 120/58 (01/02 0726) SpO2:  [97 %-100 %] 97 % (01/02 0726)  Intake/Output from previous day:  Intake/Output Summary (Last 24 hours) at 12/28/2020 1025 Last data filed at 12/28/2020 0900 Gross per 24 hour  Intake --  Output 2125 ml  Net -2125 ml    Intake/Output this shift: Total I/O In: -  Out: 500 [Urine:500]  Labs: No results for input(s): HGB in the last 72 hours. No results for input(s): WBC, RBC, HCT, PLT in the last 72 hours. No results for input(s): NA, K, CL, CO2, BUN, CREATININE, GLUCOSE, CALCIUM in the last 72 hours. No results for input(s): LABPT, INR in the last 72 hours.  EXAM General - Patient is Alert, Appropriate and Oriented Extremity - ABD soft Sensation intact distally Intact pulses distally Dorsiflexion/Plantar flexion intact Incision: dressing C/D/I Dressing/Incision - Honeycomb dressing intact without drainage. Motor Function - intact, moving foot and toes well on exam.  Abdomen soft with normal bowel sounds. Negative Homans to bilateral legs.  Past Medical History:  Diagnosis Date  . Arthritis   . childhood infection    chicken pox, measels, and mumps  . Hypertension   . Hypothyroidism   . Sinus problem   . Ulnar neuropathy    bilateral    Assessment/Plan: 4 Days Post-Op Procedure(s) (LRB): COMPUTER ASSISTED TOTAL KNEE ARTHROPLASTY  (Left) Active Problems:   Total knee replacement status  Estimated body mass index is 27.44 kg/m as calculated from the following:   Height as of this encounter: 6\' 1"  (1.854 m).   Weight as of this encounter: 94.3 kg. Advance diet Up with therapy   Patient was only able to walk 100 feet yesterday with PT.  Continue with PT today. Patient has had a BM. Patient would like to go to SNF due to support at home.  Discharge to SNF after insurance auth, likely tomorrow. TED hose in place to the left leg.  DVT Prophylaxis - Lovenox, Foot Pumps and TED hose Weight-Bearing as tolerated to left leg  J. , PA-C [facility] Orthopaedic Surgery 12/28/2020, 10:25 AM

## 2020-12-29 NOTE — Progress Notes (Signed)
  Subjective: 5 Days Post-Op Procedure(s) (LRB): COMPUTER ASSISTED TOTAL KNEE ARTHROPLASTY (Left) Patient reports pain as well-controlled.   Patient is well, and has had no acute complaints or problems Plan is to go Skilled nursing facility after hospital stay. Negative for chest pain and shortness of breath Fever: no Gastrointestinal: negative for nausea and vomiting.   Patient has had a bowel movement.  Objective: Vital signs in last 24 hours: Temp:  [98.2 F (36.8 C)-98.8 F (37.1 C)] 98.3 F (36.8 C) (01/03 0420) Pulse Rate:  [78-94] 78 (01/03 0420) Resp:  [16-17] 17 (01/03 0420) BP: (97-128)/(58-70) 97/70 (01/03 0420) SpO2:  [95 %-100 %] 96 % (01/03 0420)  Intake/Output from previous day:  Intake/Output Summary (Last 24 hours) at 12/29/2020 0718 Last data filed at 12/29/2020 0422 Gross per 24 hour  Intake --  Output 1600 ml  Net -1600 ml    Intake/Output this shift: No intake/output data recorded.  Labs: No results for input(s): HGB in the last 72 hours. No results for input(s): WBC, RBC, HCT, PLT in the last 72 hours. No results for input(s): NA, K, CL, CO2, BUN, CREATININE, GLUCOSE, CALCIUM in the last 72 hours. No results for input(s): LABPT, INR in the last 72 hours.   EXAM General - Patient is Alert, Appropriate and Oriented Extremity - Neurovascular intact Dorsiflexion/Plantar flexion intact Compartment soft Dressing/Incision -clean, dry, no drainage Motor Function - intact, moving foot and toes well on exam.  Cardiovascular- Regular rate and rhythm, no murmurs/rubs/gallops Respiratory- Lungs clear to auscultation bilaterally Gastrointestinal- soft and nontender   Assessment/Plan: 5 Days Post-Op Procedure(s) (LRB): COMPUTER ASSISTED TOTAL KNEE ARTHROPLASTY (Left) Active Problems:   Total knee replacement status  Estimated body mass index is 27.44 kg/m as calculated from the following:   Height as of this encounter: 6\' 1"  (1.854 m).   Weight as of  this encounter: 94.3 kg. Advance diet Up with therapy  Patient to d/c to SNF pending insurance auth.   DVT Prophylaxis - Lovenox, Ted hose and foot pumps Weight-Bearing as tolerated to left leg  , PA-C [facility] Orthopaedic Surgery 12/29/2020, 7:18 AM

## 2020-12-29 NOTE — Care Management Important Message (Signed)
Important Message  Patient Details  Name: Daniel Carr MRN: [id number] Date of Birth: [birth year]   Medicare Important Message Given:  Yes     [name] [name] 12/29/2020, 10:11 AM

## 2020-12-29 NOTE — Progress Notes (Signed)
Physical Therapy Treatment Patient Details Name: Daniel Carr MRN: [id number] DOB: [birth year] Today's Date: 12/29/2020    History of Present Illness Patient is a 86 year old male with degenerative arthrosis of the left knee s/p left total knee arthroplasty 12/24/20. Past medical history of right total knee arthroplasty 2012, hypertension, osteoarthritis, eye syrgery, detached retina, glaucoma    PT Comments    Pt ready for session.  OOB with ease and walk 120' with RW and min guard.  Standing ex upon return to room.  Pt voices disliking choice of SNF given distance.  Discussed discharge home and encouraged pt to talk with daughter.  He will need +1 for mobility but overall does well and discharge home is reasonable.  Will return later this am to assist with discharge planning.   Follow Up Recommendations  SNF     Equipment Recommendations  Rolling walker with 5" wheels    Recommendations for Other Services       Precautions / Restrictions Precautions Precautions: Knee;Fall Restrictions Weight Bearing Restrictions: Yes LLE Weight Bearing: Weight bearing as tolerated    Mobility  Bed Mobility Overal bed mobility: Modified Independent Bed Mobility: Supine to Sit     Supine to sit: Modified independent (Device/Increase time)        Transfers Overall transfer level: Needs assistance Equipment used: Rolling walker (2 wheeled) Transfers: Sit to/from Stand Sit to Stand: Min assist         General transfer comment: some increased assist today - pt had not had pain meds but wanted to participate anyways  Ambulation/Gait Ambulation/Gait assistance: Min guard Gait Distance (Feet): 120 Feet Assistive device: Rolling walker (2 wheeled) Gait Pattern/deviations: Step-to pattern;Decreased stance time - left;Decreased step length - left Gait velocity: decreased       Stairs Stairs: Yes Stairs assistance: Min guard Stair Management: Two rails;Step to  pattern;Forwards Number of Stairs: 4     Wheelchair Mobility    Modified Rankin (Stroke Patients Only)       Balance Overall balance assessment: Needs assistance Sitting-balance support: No upper extremity supported;Feet supported Sitting balance-Leahy Scale: Good     Standing balance support: Bilateral upper extremity supported Standing balance-Leahy Scale: Fair Standing balance comment: patient relying heavily on rolling walker for UE support in standing                            Cognition Arousal/Alertness: Awake/alert Behavior During Therapy: WFL for tasks assessed/performed Overall Cognitive Status: Within Functional Limits for tasks assessed                                        Exercises Total Joint Exercises Quad Sets: AROM;Strengthening;Left;10 reps;Supine Short Arc Quad: AAROM;Strengthening;Left;10 reps;Supine Heel Slides: AAROM;Strengthening;Left;10 reps;Standing Hip ABduction/ADduction: AAROM;Strengthening;Left;10 reps;Supine Straight Leg Raises: AROM;Strengthening;Left;10 reps;Supine Goniometric ROM: 3-95    General Comments        Pertinent Vitals/Pain Pain Assessment: Faces Faces Pain Scale: Hurts little more Pain Location: left knee Pain Descriptors / Indicators: Aching;Sore Pain Intervention(s): Limited activity within patient's tolerance;Monitored during session;Repositioned;Ice applied    Home Living                      Prior Function            PT Goals (current goals can now be found in the care plan section) Progress  towards PT goals: Progressing toward goals    Frequency    BID      PT Plan Current plan remains appropriate    Co-evaluation              AM-PAC PT "6 Clicks" Mobility   Outcome Measure  Help needed turning from your back to your side while in a flat bed without using bedrails?: None Help needed moving from lying on your back to sitting on the side of a flat bed  without using bedrails?: None Help needed moving to and from a bed to a chair (including a wheelchair)?: A Little Help needed standing up from a chair using your arms (e.g., wheelchair or bedside chair)?: A Little Help needed to walk in hospital room?: A Little Help needed climbing 3-5 steps with a railing? : A Little 6 Click Score: 20    End of Session Equipment Utilized During Treatment: Gait belt Activity Tolerance: Patient tolerated treatment well Patient left: with call bell/phone within reach;in chair;with chair alarm set Nurse Communication: Mobility status Pain - Right/Left: Left Pain - part of body: Knee     Time: 2376-2831 PT Time Calculation (min) (ACUTE ONLY): 20 min  Charges:  $Gait Training: 8-22 mins                    Danielle Dess, PTA 12/29/20, 12:03 PM

## 2020-12-29 NOTE — TOC Progression Note (Addendum)
Transition of Care [facility]) - Progression Note    Patient Details  Name: Daniel Carr MRN: [id number] Date of Birth: 22-Sep-1935  Transition of Care [facility]) CM/SW [place], RN Phone Number: 12/29/2020, 8:28 AM  Clinical Narrative:   RNCM met with patient at bedside to discuss [facility] as bed offer. Patient reports he would like to call his daughter and discuss once he finishes breakfast. RNCM will follow up.   10:15am: Spoke with patient in room, he is still waiting on his daughter to call him back. Did discuss in detail with patient of differences between going home with home health and going to facility.     Expected Discharge Plan: [place] Barriers to Discharge: No Barriers Identified  Expected Discharge Plan and Services Expected Discharge Plan: [place] Choice: [place] arrangements for the past 2 months: Single Family Home Expected Discharge Date: 12/29/20                                     Social Determinants of Health (SDOH) Interventions    Readmission Risk Interventions No flowsheet data found.

## 2020-12-29 NOTE — Progress Notes (Signed)
Physical Therapy Treatment Patient Details Name: Daniel Carr MRN: [id number] DOB: [birth year] Today's Date: 12/29/2020    History of Present Illness Patient is a 86 year old male with degenerative arthrosis of the left knee s/p left total knee arthroplasty 12/24/20. Past medical history of right total knee arthroplasty 2012, hypertension, osteoarthritis, eye syrgery, detached retina, glaucoma    PT Comments    Pt ready for session.  Stated he has decided against SNF and will be going home.  He is able to walk to rehab gym for stair training and returned to supine.  Discussed safety at home and +1 assist from daughter for mobility.  Voiced understanding.  Also discussed use of pain meds to allow for improved mobility with less pain for safety.  Voiced understanding.    Follow Up Recommendations  Home health PT;Supervision for mobility/OOB     Equipment Recommendations  Rolling walker with 5" wheels;3in1 (PT)    Recommendations for Other Services       Precautions / Restrictions Precautions Precautions: Knee;Fall Restrictions Weight Bearing Restrictions: Yes LLE Weight Bearing: Weight bearing as tolerated    Mobility  Bed Mobility Overal bed mobility: Modified Independent Bed Mobility: Supine to Sit     Supine to sit: Modified independent (Device/Increase time) Sit to supine: Modified independent (Device/Increase time)      Transfers Overall transfer level: Needs assistance Equipment used: Rolling walker (2 wheeled) Transfers: Sit to/from Stand Sit to Stand: Min assist         General transfer comment: some increased assist today - pt had not had pain meds but wanted to participate anyways  Ambulation/Gait Ambulation/Gait assistance: Min guard Gait Distance (Feet): 200 Feet Assistive device: Rolling walker (2 wheeled) Gait Pattern/deviations: Step-to pattern;Decreased stance time - left;Decreased step length - left Gait velocity: decreased        Stairs Stairs: Yes Stairs assistance: Min guard Stair Management: Two rails;Step to pattern;Forwards Number of Stairs: 4     Wheelchair Mobility    Modified Rankin (Stroke Patients Only)       Balance Overall balance assessment: Needs assistance Sitting-balance support: No upper extremity supported;Feet supported Sitting balance-Leahy Scale: Good     Standing balance support: Bilateral upper extremity supported Standing balance-Leahy Scale: Fair Standing balance comment: patient relying heavily on rolling walker for UE support in standing                            Cognition Arousal/Alertness: Awake/alert Behavior During Therapy: WFL for tasks assessed/performed Overall Cognitive Status: Within Functional Limits for tasks assessed                                        Exercises Total Joint Exercises Quad Sets: AROM;Strengthening;Left;10 reps;Supine Short Arc Quad: AAROM;Strengthening;Left;10 reps;Supine Heel Slides: AAROM;Strengthening;Left;10 reps;Standing Hip ABduction/ADduction: AAROM;Strengthening;Left;10 reps;Supine Straight Leg Raises: AROM;Strengthening;Left;10 reps;Supine Goniometric ROM: 3-95    General Comments        Pertinent Vitals/Pain Pain Assessment: Faces Faces Pain Scale: Hurts little more Pain Location: left knee Pain Descriptors / Indicators: Aching;Sore Pain Intervention(s): Limited activity within patient's tolerance;Monitored during session;Repositioned;RN gave pain meds during session;Ice applied    Home Living                      Prior Function            PT  Goals (current goals can now be found in the care plan section) Progress towards PT goals: Progressing toward goals    Frequency    BID      PT Plan Discharge plan needs to be updated    Co-evaluation              AM-PAC PT "6 Clicks" Mobility   Outcome Measure  Help needed turning from your back to your side  while in a flat bed without using bedrails?: None Help needed moving from lying on your back to sitting on the side of a flat bed without using bedrails?: None Help needed moving to and from a bed to a chair (including a wheelchair)?: A Little Help needed standing up from a chair using your arms (e.g., wheelchair or bedside chair)?: A Little Help needed to walk in hospital room?: A Little Help needed climbing 3-5 steps with a railing? : A Little 6 Click Score: 20    End of Session Equipment Utilized During Treatment: Gait belt Activity Tolerance: Patient tolerated treatment well Patient left: with call bell/phone within reach;in bed;with bed alarm set Nurse Communication: Mobility status PT Visit Diagnosis: Muscle weakness (generalized) (M62.81);Other abnormalities of gait and mobility (R26.89);Pain Pain - Right/Left: Left Pain - part of body: Knee     Time: [id number] PT Time Calculation (min) (ACUTE ONLY): 16 min  Charges:  $Gait Training: 8-22 mins                    Chesley Noon, PTA 12/29/20, 12:08 PM

## 2020-12-30 DIAGNOSIS — E785 Hyperlipidemia, unspecified: Secondary | ICD-10-CM | POA: Diagnosis not present

## 2020-12-30 DIAGNOSIS — M150 Primary generalized (osteo)arthritis: Secondary | ICD-10-CM | POA: Diagnosis not present

## 2020-12-30 DIAGNOSIS — R351 Nocturia: Secondary | ICD-10-CM | POA: Diagnosis not present

## 2020-12-30 DIAGNOSIS — J309 Allergic rhinitis, unspecified: Secondary | ICD-10-CM | POA: Diagnosis not present

## 2020-12-30 DIAGNOSIS — E039 Hypothyroidism, unspecified: Secondary | ICD-10-CM | POA: Diagnosis not present

## 2020-12-30 DIAGNOSIS — H409 Unspecified glaucoma: Secondary | ICD-10-CM | POA: Diagnosis not present

## 2020-12-30 DIAGNOSIS — I119 Hypertensive heart disease without heart failure: Secondary | ICD-10-CM | POA: Diagnosis not present

## 2020-12-30 DIAGNOSIS — N401 Enlarged prostate with lower urinary tract symptoms: Secondary | ICD-10-CM | POA: Diagnosis not present

## 2020-12-30 DIAGNOSIS — Z471 Aftercare following joint replacement surgery: Secondary | ICD-10-CM | POA: Diagnosis not present

## 2021-01-01 ENCOUNTER — Ambulatory Visit: Payer: Self-pay

## 2021-01-01 DIAGNOSIS — Z471 Aftercare following joint replacement surgery: Secondary | ICD-10-CM | POA: Diagnosis not present

## 2021-01-01 DIAGNOSIS — M150 Primary generalized (osteo)arthritis: Secondary | ICD-10-CM | POA: Diagnosis not present

## 2021-01-01 DIAGNOSIS — J309 Allergic rhinitis, unspecified: Secondary | ICD-10-CM | POA: Diagnosis not present

## 2021-01-01 DIAGNOSIS — E785 Hyperlipidemia, unspecified: Secondary | ICD-10-CM | POA: Diagnosis not present

## 2021-01-01 DIAGNOSIS — R351 Nocturia: Secondary | ICD-10-CM | POA: Diagnosis not present

## 2021-01-01 DIAGNOSIS — E039 Hypothyroidism, unspecified: Secondary | ICD-10-CM | POA: Diagnosis not present

## 2021-01-01 DIAGNOSIS — H409 Unspecified glaucoma: Secondary | ICD-10-CM | POA: Diagnosis not present

## 2021-01-01 DIAGNOSIS — I119 Hypertensive heart disease without heart failure: Secondary | ICD-10-CM | POA: Diagnosis not present

## 2021-01-01 DIAGNOSIS — N401 Enlarged prostate with lower urinary tract symptoms: Secondary | ICD-10-CM | POA: Diagnosis not present

## 2021-01-02 DIAGNOSIS — N401 Enlarged prostate with lower urinary tract symptoms: Secondary | ICD-10-CM | POA: Diagnosis not present

## 2021-01-02 DIAGNOSIS — E785 Hyperlipidemia, unspecified: Secondary | ICD-10-CM | POA: Diagnosis not present

## 2021-01-02 DIAGNOSIS — Z471 Aftercare following joint replacement surgery: Secondary | ICD-10-CM | POA: Diagnosis not present

## 2021-01-02 DIAGNOSIS — R351 Nocturia: Secondary | ICD-10-CM | POA: Diagnosis not present

## 2021-01-02 DIAGNOSIS — J309 Allergic rhinitis, unspecified: Secondary | ICD-10-CM | POA: Diagnosis not present

## 2021-01-02 DIAGNOSIS — E039 Hypothyroidism, unspecified: Secondary | ICD-10-CM | POA: Diagnosis not present

## 2021-01-02 DIAGNOSIS — M150 Primary generalized (osteo)arthritis: Secondary | ICD-10-CM | POA: Diagnosis not present

## 2021-01-02 DIAGNOSIS — H409 Unspecified glaucoma: Secondary | ICD-10-CM | POA: Diagnosis not present

## 2021-01-02 DIAGNOSIS — I119 Hypertensive heart disease without heart failure: Secondary | ICD-10-CM | POA: Diagnosis not present

## 2021-01-05 ENCOUNTER — Ambulatory Visit: Payer: Self-pay | Admitting: General Practice

## 2021-01-05 DIAGNOSIS — R351 Nocturia: Secondary | ICD-10-CM | POA: Diagnosis not present

## 2021-01-05 DIAGNOSIS — M150 Primary generalized (osteo)arthritis: Secondary | ICD-10-CM | POA: Diagnosis not present

## 2021-01-05 DIAGNOSIS — Z96652 Presence of left artificial knee joint: Secondary | ICD-10-CM

## 2021-01-05 DIAGNOSIS — E785 Hyperlipidemia, unspecified: Secondary | ICD-10-CM | POA: Diagnosis not present

## 2021-01-05 DIAGNOSIS — J309 Allergic rhinitis, unspecified: Secondary | ICD-10-CM | POA: Diagnosis not present

## 2021-01-05 DIAGNOSIS — N401 Enlarged prostate with lower urinary tract symptoms: Secondary | ICD-10-CM | POA: Diagnosis not present

## 2021-01-05 DIAGNOSIS — I10 Essential (primary) hypertension: Secondary | ICD-10-CM

## 2021-01-05 DIAGNOSIS — H409 Unspecified glaucoma: Secondary | ICD-10-CM | POA: Diagnosis not present

## 2021-01-05 DIAGNOSIS — Z471 Aftercare following joint replacement surgery: Secondary | ICD-10-CM | POA: Diagnosis not present

## 2021-01-05 DIAGNOSIS — E039 Hypothyroidism, unspecified: Secondary | ICD-10-CM | POA: Diagnosis not present

## 2021-01-05 DIAGNOSIS — E7800 Pure hypercholesterolemia, unspecified: Secondary | ICD-10-CM

## 2021-01-05 DIAGNOSIS — I119 Hypertensive heart disease without heart failure: Secondary | ICD-10-CM | POA: Diagnosis not present

## 2021-01-05 NOTE — Chronic Care Management (AMB) (Signed)
Chronic Care Management   Follow Up Note   01/05/2021 Name: Daniel Carr MRN: [id number] DOB: Jul 15, 1935  Referred by: [name], MD Reason for referral : Chronic Care Management (RNCM: Follow up call for [name] discharge from the hospital on1-02-2021)   Daniel Carr is a 86 y.o. year old male who is a primary care patient of [name], [name], MD. The CCM team was consulted for assistance with chronic disease management and care coordination needs.    Review of patient status, including review of consultants reports, relevant laboratory and other test results, and collaboration with appropriate care team members and the patient's provider was performed as part of comprehensive patient evaluation and provision of chronic care management services.    SDOH (Social Determinants of Health) assessments performed: Yes See Care Plan activities for detailed interventions related to [name])     Outpatient Encounter Medications as of 01/05/2021  Medication Sig  . amLODipine (NORVASC) 2.5 MG tablet Take 1 tablet by mouth once daily (Patient taking differently: Take 2.5 mg by mouth daily.)  . azelastine (ASTELIN) 0.1 % nasal spray Place 1 spray into the nose daily as needed (allergies).  . bimatoprost (LUMIGAN) 0.01 % SOLN Place 1 drop into both eyes at bedtime.  . brimonidine (ALPHAGAN) 0.2 % ophthalmic solution Place 1 drop into both eyes in the morning and at bedtime.  . celecoxib (CELEBREX) 200 MG capsule Take 1 capsule (200 mg total) by mouth 2 (two) times daily.  . COD LIVER OIL PO Take 1 capsule by mouth daily.  [name] dutasteride (AVODART) 0.5 MG capsule Take 1 capsule by mouth once daily (Patient taking differently: Take 0.5 mg by mouth daily.)  . enoxaparin (LOVENOX) 40 MG/0.4ML injection Inject 0.4 mLs (40 mg total) into the skin daily for 14 days.  . hydrochlorothiazide (HYDRODIURIL) 25 MG tablet TAKE 1/2 (ONE-HALF) TABLET BY MOUTH ONCE DAILY . APPOINTMENT REQUIRED FOR FUTURE  REFILLS (Patient taking differently: Take 12.5 mg by mouth daily.)  . ipratropium (ATROVENT) 0.06 % nasal spray Place 1 spray into both nostrils 2 (two) times daily as needed for rhinitis.  [name] levocetirizine (XYZAL) 5 MG tablet Take 5 mg by mouth every evening.  . lovastatin (MEVACOR) 40 MG tablet Take 1 tablet by mouth once daily (Patient taking differently: Take 40 mg by mouth daily.)  . Multiple Vitamin (DAILY VITAMIN PO) Take 1 tablet by mouth daily.  [name] oxyCODONE (OXY IR/ROXICODONE) 5 MG immediate release tablet Take 1 tablet (5 mg total) by mouth every 4 (four) hours as needed for severe pain.  [name] SYNTHROID 125 MCG tablet Take 1 tablet (125 mcg total) by mouth daily. (Patient taking differently: Take 125 mcg by mouth daily before breakfast.)  . traMADol (ULTRAM) 50 MG tablet Take 1 tablet (50 mg total) by mouth every 4 (four) hours as needed for moderate pain.  . Travoprost, BAK Free, (TRAVATAN) 0.004 % SOLN ophthalmic solution Apply 1 drop to eye at bedtime.   No facility-administered encounter medications on file as of 01/05/2021.     Objective:   Goals Addressed            This Visit's Progress   . Chronic Disease Management       CARE PLAN ENTRY (see longitudinal plan of care for additional care plan information)  Current Barriers:  . Chronic Disease Management support and education needs related to Hypertension, Chronic Bronchitis, Hypercholesterolemia and Left Knee Osteoarthritis.  Case Manager Clinical Goal(s):  Over the next 120 days,  patient: . Will not require hospitalization or emergent care d/t complications r/t chronic illnesses.  . Will attend all scheduled medical appointments. . Will take all medications as prescribed. . Will monitor blood pressure and record readings. . Will follow safety recommendations to prevents falls and injuries. Over the next 60 days, patient will: . Follow up with [facility] regarding right foot. [name] Update the chronic care  management team with needs r/t pending knee surgery.   Interventions:  . Inter-disciplinary care team collaboration (see longitudinal plan of care) . Reviewed medications and discussed indications for use. Encouraged to continue taking medications as prescribed. Advised to notify provider if unable to tolerate prescribed regimen. Encouraged to notify the care management team with concerns regarding prescription costs.  . Provided information regarding hypertension management and established BP parameters. Discussed indications for notifying a provider. Encouraged to monitor a few times a week if unable to monitor daily and record readings. Reports doing well with a heart healthy diet and taking BP medications as prescribed. Reports home systolic readings have ranged from 109 to 143. Reports diastolic readings in the mid 60's to 80's. BP today was 127/71.  [name] Discussed activity tolerance. Discussed safety and fall prevention measures. Encouraged to continue using a cane when ambulating to prevent accidental falls. Encouraged to keep pathways clear and well lit. Denies recent falls. He noticed a corn and calloused area to his right foot. He reports being evaluated by [facility] in the past and would like to follow-up. He is also being followed by the Orthopedic team for left knee pain and currently on a wait list for surgery. Anticipates completing the procedure within the next two months. 01-05-2021: The patient is home from hospital post knee replacement surgery. Spoke to the patients daughter [name]. She is concerned because the PT working with the patient states he will have to do outpatient rehab after Wednesday and this is not going to work for the patient as the patients daughter does not have the ability to get the patient to rehab. Asking for an extension on PT services as well as services for OT and home health. Co-collaboration with the RNCM.  Primary RNCM to follow up with the patients  daughter [name]. She is waiting for finalization from the home health and nurse from orthopedic MD. Will continue to assist as needed.   . Reviewed pending appointments. Encouraged to attend medical appointments as scheduled to prevent delays in care. Encouraged to notify the care management team with concerns regarding transportation.  . Discussed plans for ongoing care management and follow up. Provided direct contact information. Mr. Strauch reports doing well. He is independent in the home and serves as the primary caregiver for his spouse. His daughter [name] is also available to assist. Denies current need for additional assistance in the home. No urgent care management needs. Agreeable to outreach later this month to discuss pending surgery.     Patient Self Care Activities:  . Self administers medications . Attends scheduled provider appointments . Performs ADL's independently . Calls provider office for new concerns or questions   Please see past updates related to this goal by clicking on the "Past Updates" button in the selected goal           Plan:   The care management team will reach out to the patient again over the next 7 to 14 days.    Noreene Larsson RN, MSN, [place]  Orthoptist [employer]: [phone]

## 2021-01-05 NOTE — Patient Instructions (Signed)
Visit Information  Goals Addressed            This Visit's Progress   . Chronic Disease Management       CARE PLAN ENTRY (see longitudinal plan of care for additional care plan information)  Current Barriers:  . Chronic Disease Management support and education needs related to Hypertension, Chronic Bronchitis, Hypercholesterolemia and Left Knee Osteoarthritis.  Case Manager Clinical Goal(s):  Over the next 120 days, patient: . Will not require hospitalization or emergent care d/t complications r/t chronic illnesses.  . Will attend all scheduled medical appointments. . Will take all medications as prescribed. . Will monitor blood pressure and record readings. . Will follow safety recommendations to prevents falls and injuries. Over the next 60 days, patient will: . Follow up with [facility] regarding right foot. [name] Update the chronic care management team with needs r/t pending knee surgery.   Interventions:  . Inter-disciplinary care team collaboration (see longitudinal plan of care) . Reviewed medications and discussed indications for use. Encouraged to continue taking medications as prescribed. Advised to notify provider if unable to tolerate prescribed regimen. Encouraged to notify the care management team with concerns regarding prescription costs.  . Provided information regarding hypertension management and established BP parameters. Discussed indications for notifying a provider. Encouraged to monitor a few times a week if unable to monitor daily and record readings. Reports doing well with a heart healthy diet and taking BP medications as prescribed. Reports home systolic readings have ranged from 109 to 143. Reports diastolic readings in the mid 60's to 80's. BP today was 127/71.  [name] Discussed activity tolerance. Discussed safety and fall prevention measures. Encouraged to continue using a cane when ambulating to prevent accidental falls. Encouraged to keep pathways  clear and well lit. Denies recent falls. He noticed a corn and calloused area to his right foot. He reports being evaluated by [facility] in the past and would like to follow-up. He is also being followed by the Orthopedic team for left knee pain and currently on a wait list for surgery. Anticipates completing the procedure within the next two months. 01-05-2021: The patient is home from hospital post knee replacement surgery. Spoke to the patients daughter [name]. She is concerned because the PT working with the patient states he will have to do outpatient rehab after Wednesday and this is not going to work for the patient as the patients daughter does not have the ability to get the patient to rehab. Asking for an extension on PT services as well as services for OT and home health. Co-collaboration with the RNCM.  Primary RNCM to follow up with the patients daughter [name]. She is waiting for finalization from the home health and nurse from orthopedic MD. Will continue to assist as needed.   . Reviewed pending appointments. Encouraged to attend medical appointments as scheduled to prevent delays in care. Encouraged to notify the care management team with concerns regarding transportation.  . Discussed plans for ongoing care management and follow up. Provided direct contact information. Mr. Mazzocco reports doing well. He is independent in the home and serves as the primary caregiver for his spouse. His daughter [name] is also available to assist. Denies current need for additional assistance in the home. No urgent care management needs. Agreeable to outreach later this month to discuss pending surgery.     Patient Self Care Activities:  . Self administers medications . Attends scheduled provider appointments . Performs ADL's independently .  Calls provider office for new concerns or questions   Please see past updates related to this goal by clicking on the "Past Updates" button in the  selected goal         The patient verbalized understanding of instructions, educational materials, and care plan provided today and declined offer to receive copy of patient instructions, educational materials, and care plan.   Telephone follow up appointment with care management team member scheduled for: 7-14 days  Vanita Ingles

## 2021-01-06 ENCOUNTER — Telehealth (INDEPENDENT_AMBULATORY_CARE_PROVIDER_SITE_OTHER): Payer: Medicare HMO | Admitting: Family Medicine

## 2021-01-06 ENCOUNTER — Other Ambulatory Visit: Payer: Self-pay

## 2021-01-06 ENCOUNTER — Encounter: Payer: Self-pay | Admitting: Family Medicine

## 2021-01-06 ENCOUNTER — Ambulatory Visit: Payer: Self-pay

## 2021-01-06 DIAGNOSIS — K5903 Drug induced constipation: Secondary | ICD-10-CM | POA: Diagnosis not present

## 2021-01-06 NOTE — Chronic Care Management (AMB) (Signed)
  Chronic Care Management   Follow Up Note   01/06/2021 Name: Daniel Carr MRN: [id number] DOB: 10-Dec-1935  Primary Care Provider: [name], MD   Brief outreach with [name] daughter yesterday and again today. She was informed of the Ortho team's recommendation to complete outpatient versus home health physical therapy. Explained the overall goal was to promote the best outcome for [name] following the knee replacement.  We discussed options for the care team to assist with transportation to ensure he attends outpatient therapy. She is agreeable to plan but still prefers a brief extension of home health services until arrangements are confirmed.   A call was placed to the Ortho clinic today to discuss anticipated therapy dates and option to extend services while transportation is being arranged.  Also contacted [facility] to confirm anticipated discharge date and determine what orders are needed if the request for extended services is approved.    PLAN Pending return calls from the Ortho and home health team.    [facility] Care Management [facility] [phone]

## 2021-01-06 NOTE — Progress Notes (Signed)
Virtual telephone visit    Virtual Visit via Telephone Note   This visit type was conducted due to national recommendations for restrictions regarding the COVID-19 Pandemic (e.g. social distancing) in an effort to limit this patient's exposure and mitigate transmission in our community. Due to his co-morbid illnesses, this patient is at least at moderate risk for complications without adequate follow up. This format is felt to be most appropriate for this patient at this time. The patient did not have access to video technology or had technical difficulties with video requiring transitioning to audio format only (telephone). Physical exam was limited to content and character of the telephone converstion.    Patient location: home Provider location: [facility]  I discussed the limitations of evaluation and management by telemedicine and the availability of in person appointments. The patient expressed understanding and agreed to proceed.   Visit Date: 01/06/2021  Today's healthcare provider: Lelon Huh, MD   Chief Complaint  Patient presents with  . Constipation   Subjective    Constipation The current episode started in the past 7 days. The problem has been gradually improving since onset. The stool is described as firm. There has been adequate water intake. Associated symptoms include abdominal pain and bloating. Pertinent negatives include no fever, hematochezia, hemorrhoids, melena, nausea or vomiting. He has tried laxatives and stool softeners for the symptoms. The treatment provided moderate relief.    Patient reports that he has had issues with constipation since he was discharged from the hospital 1 week ago. He was hospitalized due to a total knee replacement. He still has pain medication left, and reports that he only takes it as needed. He hasn't had any pain meds in 2 days. He reports that his symptoms have improved since taking milk of magnesia and drinking prune juice  yesterday.        Medications: Outpatient Medications Prior to Visit  Medication Sig  . amLODipine (NORVASC) 2.5 MG tablet Take 1 tablet by mouth once daily (Patient taking differently: Take 2.5 mg by mouth daily.)  . azelastine (ASTELIN) 0.1 % nasal spray Place 1 spray into the nose daily as needed (allergies).  . bimatoprost (LUMIGAN) 0.01 % SOLN Place 1 drop into both eyes at bedtime.  . brimonidine (ALPHAGAN) 0.2 % ophthalmic solution Place 1 drop into both eyes in the morning and at bedtime.  . celecoxib (CELEBREX) 200 MG capsule Take 1 capsule (200 mg total) by mouth 2 (two) times daily.  . COD LIVER OIL PO Take 1 capsule by mouth daily.  [name] dutasteride (AVODART) 0.5 MG capsule Take 1 capsule by mouth once daily (Patient taking differently: Take 0.5 mg by mouth daily.)  . hydrochlorothiazide (HYDRODIURIL) 25 MG tablet TAKE 1/2 (ONE-HALF) TABLET BY MOUTH ONCE DAILY . APPOINTMENT REQUIRED FOR FUTURE REFILLS (Patient taking differently: Take 12.5 mg by mouth daily.)  . ipratropium (ATROVENT) 0.06 % nasal spray Place 1 spray into both nostrils 2 (two) times daily as needed for rhinitis.  [name] levocetirizine (XYZAL) 5 MG tablet Take 5 mg by mouth every evening.  . lovastatin (MEVACOR) 40 MG tablet Take 1 tablet by mouth once daily (Patient taking differently: Take 40 mg by mouth daily.)  . Multiple Vitamin (DAILY VITAMIN PO) Take 1 tablet by mouth daily.  [name] oxyCODONE (OXY IR/ROXICODONE) 5 MG immediate release tablet Take 1 tablet (5 mg total) by mouth every 4 (four) hours as needed for severe pain.  [name] SYNTHROID 125 MCG tablet Take 1 tablet (125 mcg  total) by mouth daily. (Patient taking differently: Take 125 mcg by mouth daily before breakfast.)  . traMADol (ULTRAM) 50 MG tablet Take 1 tablet (50 mg total) by mouth every 4 (four) hours as needed for moderate pain.  . Travoprost, BAK Free, (TRAVATAN) 0.004 % SOLN ophthalmic solution Apply 1 drop to eye at bedtime.  . enoxaparin (LOVENOX) 40  MG/0.4ML injection Inject 0.4 mLs (40 mg total) into the skin daily for 14 days. (Patient not taking: Reported on 01/06/2021)   No facility-administered medications prior to visit.    Review of Systems  Constitutional: Negative for fever.  Gastrointestinal: Positive for abdominal pain, bloating and constipation. Negative for hematochezia, hemorrhoids, melena, nausea and vomiting.      Objective    There were no vitals taken for this visit.   Awake, alert, oriented x 3. In no apparent distress   Assessment & Plan     1. Drug-induced constipation Much better today after taking MOM and prune juice. Likely secondary to reduced mobility after recent knee surgery and opioid pain medications. Recommend increase fiber in diet and take daily fiber supplements. Drink more water and take daily stool softener until he no longer needs to take pain medications.      I discussed the assessment and treatment plan with the patient. The patient was provided an opportunity to ask questions and all were answered. The patient agreed with the plan and demonstrated an understanding of the instructions.   The patient was advised to call back or seek an in-person evaluation if the symptoms worsen or if the condition fails to improve as anticipated.  I provided 12 minutes of non-face-to-face time during this encounter.  The entirety of the information documented in the History of Present Illness, Review of Systems and Physical Exam were personally obtained by me. Portions of this information were initially documented by the CMA and reviewed by me for thoroughness and accuracy.     Lelon Huh, MD [facility] [phone] (phone) [phone] (fax)  [place]

## 2021-01-06 NOTE — Chronic Care Management (AMB) (Signed)
  Chronic Care Management   Outreach Note   Name: Alvah Lagrow MRN: [id number] DOB: [birth year]  Primary Care Provider: [name], MD   Call received from Mr. Reynolds Bowl daughter/caregiver, [name]. Mr. Nielson is currently receiving Physical Therapy and skilled nursing services with [facility] [name] reports being informed by the Physical Therapist that Mr. Chavira is scheduled to start outpatient therapy next week and his home health services will end. She reports having multiple obligations and does not anticipate being available to transport him to multiple outpatient therapy appointments. She is requesting to extend his home health services. Contacted the [facility] [employer] Message relayed to Dr. Clydell Hakim team regarding request to extend home health services.    PLAN Pending call back from the Ortho team. Will follow-up with [name] regarding treatment recommendations.   [facility] Care Management [facility] [phone]

## 2021-01-07 ENCOUNTER — Telehealth: Payer: Self-pay | Admitting: Family Medicine

## 2021-01-07 ENCOUNTER — Ambulatory Visit: Payer: Self-pay

## 2021-01-07 DIAGNOSIS — N401 Enlarged prostate with lower urinary tract symptoms: Secondary | ICD-10-CM | POA: Diagnosis not present

## 2021-01-07 DIAGNOSIS — H409 Unspecified glaucoma: Secondary | ICD-10-CM | POA: Diagnosis not present

## 2021-01-07 DIAGNOSIS — I119 Hypertensive heart disease without heart failure: Secondary | ICD-10-CM | POA: Diagnosis not present

## 2021-01-07 DIAGNOSIS — Z96652 Presence of left artificial knee joint: Secondary | ICD-10-CM

## 2021-01-07 DIAGNOSIS — I10 Essential (primary) hypertension: Secondary | ICD-10-CM

## 2021-01-07 DIAGNOSIS — Z471 Aftercare following joint replacement surgery: Secondary | ICD-10-CM | POA: Diagnosis not present

## 2021-01-07 DIAGNOSIS — R351 Nocturia: Secondary | ICD-10-CM | POA: Diagnosis not present

## 2021-01-07 DIAGNOSIS — E785 Hyperlipidemia, unspecified: Secondary | ICD-10-CM | POA: Diagnosis not present

## 2021-01-07 DIAGNOSIS — J309 Allergic rhinitis, unspecified: Secondary | ICD-10-CM | POA: Diagnosis not present

## 2021-01-07 DIAGNOSIS — M150 Primary generalized (osteo)arthritis: Secondary | ICD-10-CM | POA: Diagnosis not present

## 2021-01-07 DIAGNOSIS — E039 Hypothyroidism, unspecified: Secondary | ICD-10-CM | POA: Diagnosis not present

## 2021-01-07 NOTE — Chronic Care Management (AMB) (Signed)
Error. Please disregard

## 2021-01-07 NOTE — Chronic Care Management (AMB) (Signed)
Chronic Care Management   Follow Up Note   01/07/2021 Name: Daniel Carr MRN: [id number] DOB: [birth year]  Primary Care Provider: [name], MD Reason for referral : Chronic Care Management  Daniel Carr is a 86 y.o. year old male who is a primary care patient of Caryn Section, [name], MD. He is currently engaged with the chronic care management team.   Review of Daniel Carr status, including review of consultants reports, relevant labs and test results was conducted today. Collaboration with appropriate care team members was performed as part of the comprehensive evaluation and provision of chronic care management services.    SDOH (Social Determinants of Health) assessments performed: Yes  SDOH Interventions   Flowsheet Row Most Recent Value  SDOH Interventions   Transportation Interventions Other (Comment)  [Referral placed for transportation assistance.]         Outpatient Encounter Medications as of 01/07/2021  Medication Sig  . amLODipine (NORVASC) 2.5 MG tablet Take 1 tablet by mouth once daily (Patient taking differently: Take 2.5 mg by mouth daily.)  . azelastine (ASTELIN) 0.1 % nasal spray Place 1 spray into the nose daily as needed (allergies).  . bimatoprost (LUMIGAN) 0.01 % SOLN Place 1 drop into both eyes at bedtime.  . brimonidine (ALPHAGAN) 0.2 % ophthalmic solution Place 1 drop into both eyes in the morning and at bedtime.  . celecoxib (CELEBREX) 200 MG capsule Take 1 capsule (200 mg total) by mouth 2 (two) times daily.  . COD LIVER OIL PO Take 1 capsule by mouth daily.  [name] dutasteride (AVODART) 0.5 MG capsule Take 1 capsule by mouth once daily (Patient taking differently: Take 0.5 mg by mouth daily.)  . enoxaparin (LOVENOX) 40 MG/0.4ML injection Inject 0.4 mLs (40 mg total) into the skin daily for 14 days. (Patient not taking: Reported on 01/06/2021)  . hydrochlorothiazide (HYDRODIURIL) 25 MG tablet TAKE 1/2 (ONE-HALF) TABLET BY MOUTH ONCE DAILY . APPOINTMENT  REQUIRED FOR FUTURE REFILLS (Patient taking differently: Take 12.5 mg by mouth daily.)  . ipratropium (ATROVENT) 0.06 % nasal spray Place 1 spray into both nostrils 2 (two) times daily as needed for rhinitis.  [name] levocetirizine (XYZAL) 5 MG tablet Take 5 mg by mouth every evening.  . lovastatin (MEVACOR) 40 MG tablet Take 1 tablet by mouth once daily (Patient taking differently: Take 40 mg by mouth daily.)  . Multiple Vitamin (DAILY VITAMIN PO) Take 1 tablet by mouth daily.  [name] oxyCODONE (OXY IR/ROXICODONE) 5 MG immediate release tablet Take 1 tablet (5 mg total) by mouth every 4 (four) hours as needed for severe pain.  [name] SYNTHROID 125 MCG tablet Take 1 tablet (125 mcg total) by mouth daily. (Patient taking differently: Take 125 mcg by mouth daily before breakfast.)  . traMADol (ULTRAM) 50 MG tablet Take 1 tablet (50 mg total) by mouth every 4 (four) hours as needed for moderate pain.  . Travoprost, BAK Free, (TRAVATAN) 0.004 % SOLN ophthalmic solution Apply 1 drop to eye at bedtime.   No facility-administered encounter medications on file as of 01/07/2021.     Objective: Goals Addressed            This Visit's Progress   . Chronic Disease Management       CARE PLAN ENTRY (see longitudinal plan of care for additional care plan information)  Current Barriers:  . Chronic Disease Management support and education needs related to Hypertension, Chronic Bronchitis, Hypercholesterolemia and Left Knee Osteoarthritis.  Case Manager Clinical Goal(s):  Over the next  120 days, patient: . Will not require hospitalization or emergent care d/t complications r/t chronic illnesses.  . Will attend all scheduled medical appointments. . Will take all medications as prescribed. . Will monitor blood pressure and record readings. . Will follow safety recommendations to prevents falls and injuries. Over the next 60 days, patient will: . Follow up with [facility] regarding right foot. [name] Update the  chronic care management team with needs r/t pending knee surgery.   Interventions:  . Inter-disciplinary care team collaboration (see longitudinal plan of care)  . Reviewed current care management needs with caregiver/daughter [name]. Daniel Carr will start outpatient physical  therapy on 01/14/21 and will require transportation. Per Ortho team, the plan is to complete therapy 2-3 times/week. Referral placed for transportation assistance. We discussed possible in-home needs d/t Daniel Carr limited mobility. [name] confirmed that the family is able to support a private pay sitter. Anticipates the sitter being available to assist later this week. Also anticipates her daughter being available to provide occasional assistance. Denies other urgent concerns. [name] agreed to keep the care management team updated of Daniel Carr needs. She is aware of the pending outreach with the [place] to confirm transportation for his upcoming appointments.      Patient Self Care Activities:  . Self administers medications . Attends scheduled provider appointments . Performs ADL's independently . Calls provider office for new concerns or questions   Please see past updates related to this goal by clicking on the "Past Updates" button in the selected goal         PLAN Referral placed for transportation assistance.  A member of the care management team will follow-up with Daniel Carr within the next two weeks.    Cristy Friedlander [facility] Care Management [facility] [phone]

## 2021-01-07 NOTE — Telephone Encounter (Signed)
   Telephone encounter was:  Unsuccessful.  01/07/2021 Name: Montana Fassnacht MRN: [id number] DOB: 03-23-1935  Unsuccessful outbound call made today to assist with:  Transportation Needs  and called Neldon Labella regarding referral.   Outreach Attempt:  1st Attempt  A HIPAA compliant voice message was left requesting a return call.  Instructed patient to call back at anytime.  SIG

## 2021-01-08 DIAGNOSIS — M6281 Muscle weakness (generalized): Secondary | ICD-10-CM | POA: Diagnosis not present

## 2021-01-08 DIAGNOSIS — M25562 Pain in left knee: Secondary | ICD-10-CM | POA: Diagnosis not present

## 2021-01-08 DIAGNOSIS — Z96652 Presence of left artificial knee joint: Secondary | ICD-10-CM | POA: Diagnosis not present

## 2021-01-08 DIAGNOSIS — M25662 Stiffness of left knee, not elsewhere classified: Secondary | ICD-10-CM | POA: Diagnosis not present

## 2021-01-08 NOTE — Telephone Encounter (Signed)
   Referral Reason: Transportation Needs   Interventions: Received call from Hughes Better, RN regarding referral for Daniel Carr. She stated that the patient will need assistance with tranportation to his therapy appointments for othropedic therapy. Informed Ms. McCray that Care Guide can assist patient with tranportation once schedule has been established with orthopedic provider.    Follow up plan: Care Guide with follow up with patient and RN Case Manager, Daniel Carr with in the next week.   [place], Care Management Phone: [phone] Email: [email or web address]

## 2021-01-09 DIAGNOSIS — H401123 Primary open-angle glaucoma, left eye, severe stage: Secondary | ICD-10-CM | POA: Diagnosis not present

## 2021-01-09 NOTE — Telephone Encounter (Signed)
   Telephone encounter was:  Successful.  01/09/2021 Name: Wilder Kurowski MRN: [id number] DOB: June 02, 1935  Emarion Toral is a 86 y.o. year old male who is a primary care patient of [name], [name], MD . The community resource team was consulted for assistance with Transportation Needs   Care guide performed the following interventions: Spoke with Ms. Gilford Rile and Mr. Pouliot regarding referral for transportaiton. Called [employer] on 3-way call to determine if Mr. Borba has the transportaiton benefit. Patient has the benefit and was given the information for [facility]. Care Guide made 3-way call to [place] to set up reservation for patient for his appointments on January 19th and January 21st. Patient was provided [facility] numbers for the rides that were scheduled. Patient's daughter asked to send information to her via e-mail. Care Guide sent information to Ms. Walker at [email or web address]. .  Follow Up Plan:  No further follow up planned at this time. The patient has been provided with needed resources.  [place], Care Management Phone: [phone] Email: [email or web address]

## 2021-01-14 DIAGNOSIS — N400 Enlarged prostate without lower urinary tract symptoms: Secondary | ICD-10-CM | POA: Diagnosis not present

## 2021-01-14 DIAGNOSIS — E039 Hypothyroidism, unspecified: Secondary | ICD-10-CM | POA: Diagnosis not present

## 2021-01-14 DIAGNOSIS — J9601 Acute respiratory failure with hypoxia: Secondary | ICD-10-CM | POA: Diagnosis not present

## 2021-01-14 DIAGNOSIS — M25562 Pain in left knee: Secondary | ICD-10-CM | POA: Diagnosis not present

## 2021-01-14 DIAGNOSIS — I2693 Single subsegmental pulmonary embolism without acute cor pulmonale: Secondary | ICD-10-CM | POA: Diagnosis not present

## 2021-01-14 DIAGNOSIS — J9811 Atelectasis: Secondary | ICD-10-CM | POA: Diagnosis not present

## 2021-01-14 DIAGNOSIS — I2699 Other pulmonary embolism without acute cor pulmonale: Secondary | ICD-10-CM | POA: Insufficient documentation

## 2021-01-14 DIAGNOSIS — D472 Monoclonal gammopathy: Secondary | ICD-10-CM | POA: Diagnosis not present

## 2021-01-14 DIAGNOSIS — E871 Hypo-osmolality and hyponatremia: Secondary | ICD-10-CM | POA: Diagnosis not present

## 2021-01-14 DIAGNOSIS — H409 Unspecified glaucoma: Secondary | ICD-10-CM | POA: Diagnosis not present

## 2021-01-14 DIAGNOSIS — R0602 Shortness of breath: Secondary | ICD-10-CM | POA: Diagnosis not present

## 2021-01-14 DIAGNOSIS — M7989 Other specified soft tissue disorders: Secondary | ICD-10-CM | POA: Diagnosis not present

## 2021-01-14 DIAGNOSIS — J90 Pleural effusion, not elsewhere classified: Secondary | ICD-10-CM | POA: Diagnosis not present

## 2021-01-14 DIAGNOSIS — I10 Essential (primary) hypertension: Secondary | ICD-10-CM | POA: Diagnosis not present

## 2021-01-14 DIAGNOSIS — K5900 Constipation, unspecified: Secondary | ICD-10-CM | POA: Diagnosis not present

## 2021-01-14 DIAGNOSIS — Z96652 Presence of left artificial knee joint: Secondary | ICD-10-CM | POA: Diagnosis not present

## 2021-01-14 DIAGNOSIS — D649 Anemia, unspecified: Secondary | ICD-10-CM | POA: Diagnosis not present

## 2021-01-14 DIAGNOSIS — J986 Disorders of diaphragm: Secondary | ICD-10-CM | POA: Diagnosis not present

## 2021-01-14 DIAGNOSIS — Z20822 Contact with and (suspected) exposure to covid-19: Secondary | ICD-10-CM | POA: Diagnosis not present

## 2021-01-14 DIAGNOSIS — R0600 Dyspnea, unspecified: Secondary | ICD-10-CM | POA: Diagnosis not present

## 2021-01-14 NOTE — Patient Instructions (Signed)
Thank you for allowing the Chronic Care Management team to participate in your care.  Goals Addressed            This Visit's Progress   . Chronic Disease Management       CARE PLAN ENTRY (see longitudinal plan of care for additional care plan information)  Current Barriers:  . Chronic Disease Management support and education needs related to Hypertension, Chronic Bronchitis, Hypercholesterolemia and Left Knee Osteoarthritis.  Case Manager Clinical Goal(s):  Over the next 120 days, patient: . Will not require hospitalization or emergent care d/t complications r/t chronic illnesses.  . Will attend all scheduled medical appointments. . Will take all medications as prescribed. . Will monitor blood pressure and record readings. . Will follow safety recommendations to prevents falls and injuries. Over the next 60 days, patient will: . Follow up with [facility] regarding right foot. [name] Update the chronic care management team with needs r/t pending knee surgery.   Interventions:  . Inter-disciplinary care team collaboration (see longitudinal plan of care)  . Reviewed current care management needs with caregiver/daughter [name]. Mr. Armwood will start outpatient physical  therapy on 01/14/21 and will require transportation. Per Ortho team, the plan is to complete therapy 2-3 times/week. Referral placed for transportation assistance. We discussed possible in-home needs d/t Mr. Budlong limited mobility. [name] confirmed that the family is able to support a private pay sitter. Anticipates the sitter being available to assist later this week. Also anticipates her daughter being available to provide occasional assistance. Denies other urgent concerns. [name] agreed to keep the care management team updated of Mr. Wadsworth needs. She is aware of the pending outreach with the [place] to confirm transportation for his upcoming appointments.      Patient Self Care Activities:   . Self administers medications . Attends scheduled provider appointments . Performs ADL's independently . Calls provider office for new concerns or questions   Please see past updates related to this goal by clicking on the "Past Updates" button in the selected goal        Mr. Craine daughter [name] verbalized understanding of the information discussed during the telephonic outreach today. Declined need for mailed information. She is aware of pending outreach with the [place].   A member of the care management team will follow up with Mr. Cali and [name] within the next two weeks.    Cristy Friedlander [facility] Care Management [facility] [phone]

## 2021-01-15 ENCOUNTER — Ambulatory Visit: Payer: Self-pay

## 2021-01-15 DIAGNOSIS — I2693 Single subsegmental pulmonary embolism without acute cor pulmonale: Secondary | ICD-10-CM | POA: Diagnosis not present

## 2021-01-15 DIAGNOSIS — J9601 Acute respiratory failure with hypoxia: Secondary | ICD-10-CM | POA: Diagnosis not present

## 2021-01-15 DIAGNOSIS — R0600 Dyspnea, unspecified: Secondary | ICD-10-CM | POA: Diagnosis not present

## 2021-01-19 ENCOUNTER — Telehealth: Payer: Self-pay

## 2021-01-19 NOTE — Telephone Encounter (Signed)
I tried Furniture conservator/restorer with [facility]. Left message to call back. I haven't seen any recent faxes on this patient. OK for [facility] triage to advise.

## 2021-01-19 NOTE — Telephone Encounter (Signed)
Copied from [place] [phone]. Topic: Quick Communication - See Telephone Encounter >> Jan 19, 2021 12:07 PM [place] wrote: CRM for notification. See Telephone encounter for: 01/19/21. Pt wants Dr Caryn Section to know Daniel Carr is sending offer a requests for oxygen as he is not able to do his PT without it. [phone]  Daughter states that P Therapy may need to be contacted that it is ok to restart since bp high and oxygen low. Pt has appt in Feb but she thinks that may be too long to hold off on physical Therapy.

## 2021-01-19 NOTE — Telephone Encounter (Signed)
Patient daughter -[name] is calling to report that the fax still has not been received by [facility]. Patient's daughter is requesting an oxygen tank.  CB-for [name]- [place] regarding an oxygen Maeola Sarah- [phone]

## 2021-01-19 NOTE — Telephone Encounter (Signed)
I called patient's daughter Daniel Carr to clarify message below. Daniel Carr says that [place] specialist will be reaching out to Dr. Caryn Section for him to sign an order for a portable oxygen tank. Patient was given a loaner oxygen tank upon discharge from his recent hospitalization. Daniel Carr gave me a name and number to [facility] with [facility] health care specialist. I tried calling the number but wasn't able to reach anyone. I called patient's daughter Daniel Carr and informed her that I wasn't able to reach anyone. Daniel Carr will try reaching out to [facility] to see when she will be faxing the orders.   Also, Daniel Carr says that patient has not been able to attend physical therapy. He has only had one session of PT, and as of now it has been put on hold.

## 2021-01-20 NOTE — Telephone Encounter (Signed)
Message for [name]: Daniel Carr has contacted the patient to inform them that the O2 tank was approved- check with [employer] to see what options are- customer service. Please call Daniel Carr back. Daniel Carr is going to contact [employer] to see who the ordering provider is and if we need to be doing anything else for them while she is waiting on call back.)

## 2021-01-20 NOTE — Telephone Encounter (Signed)
Daniel Carr has faxed over the information. Daniel Carr would like roshena to return her call

## 2021-01-20 NOTE — Chronic Care Management (AMB) (Signed)
  Chronic Care Management   Note   Name: Daniel Carr MRN: [id number] DOB: Jan 22, 1935   Brief outreach with [name] daughter [name].  [name] was evaluated in the [facility] on yesterday. [name] reports he is currently admitted and anticipating discharge within the next 24 hours. She requested assistance with cancelling his Physical Therapy appointment.  Call placed to the [facility] Physical Therapy department. Therapist will cancel his appointment on 01/16/21.   [name] agreed to contact the care management team with updates regarding in-home needs after [name] discharges home. Agreeable to follow-up outreach next week.   Cristy Friedlander [facility] Care Management [facility] [phone]

## 2021-01-20 NOTE — Telephone Encounter (Signed)
We do not have the fax on file. Called [facility] to refax Rx for Dr Caryn Section to sign. Advised pt daughter that she will be re faxing the form.

## 2021-01-21 ENCOUNTER — Telehealth: Payer: Self-pay | Admitting: Family Medicine

## 2021-01-21 ENCOUNTER — Encounter: Payer: Self-pay | Admitting: Family Medicine

## 2021-01-21 NOTE — Telephone Encounter (Signed)
Pts daughter checking on a fax from [facility] for oxygen tank / please advise if received / if not she has acopy she can bring in/

## 2021-01-21 NOTE — Telephone Encounter (Signed)
Patient's daughter brought order up to the front desk this morning. She request that these orders be completed asap so that patient can receive his oxygen tank. Orders placed in Dr. Maralyn Sago desk for review.

## 2021-01-21 NOTE — Telephone Encounter (Signed)
Forms brought into our office by patient's daughter [name]. Forms signed and faxed to Netta Neat with [facility] specialist. Fax number [phone]. I called Terrence Dupont and confirmed that she received the fax. Terrence Dupont then advised me that she had already received the orders she needed from our office a day ago.

## 2021-01-22 ENCOUNTER — Ambulatory Visit: Payer: Self-pay

## 2021-01-22 ENCOUNTER — Other Ambulatory Visit: Payer: Self-pay | Admitting: Family Medicine

## 2021-01-22 DIAGNOSIS — Z96652 Presence of left artificial knee joint: Secondary | ICD-10-CM

## 2021-01-22 DIAGNOSIS — J42 Unspecified chronic bronchitis: Secondary | ICD-10-CM

## 2021-01-22 DIAGNOSIS — E7800 Pure hypercholesterolemia, unspecified: Secondary | ICD-10-CM

## 2021-01-22 DIAGNOSIS — N401 Enlarged prostate with lower urinary tract symptoms: Secondary | ICD-10-CM

## 2021-01-22 DIAGNOSIS — I10 Essential (primary) hypertension: Secondary | ICD-10-CM

## 2021-01-22 NOTE — Telephone Encounter (Signed)
Requested medication (s) are due for refill today: expired medications  Requested medication (s) are on the active medication list: yes  Last refill:  10/03/2019 #90 4 refills, Mevacor 10/31/2019 #90 4 refills   Future visit scheduled: yes in 1 week  Notes to clinic:  expired medications, do you want to renew Rx?     Requested Prescriptions  Pending Prescriptions Disp Refills   lovastatin (MEVACOR) 40 MG tablet [Pharmacy Med Name: Lovastatin 40 MG Oral Tablet] 90 tablet 0    Sig: Take 1 tablet by mouth once daily      Cardiovascular:  Antilipid - Statins Failed - 01/22/2021  3:42 PM      Failed - LDL in normal range and within 360 days    LDL Chol Calc (NIH)  Date Value Ref Range Status  07/22/2020 64 0 - 99 mg/dL Final          Passed - Total Cholesterol in normal range and within 360 days    Cholesterol, Total  Date Value Ref Range Status  07/22/2020 149 100 - 199 mg/dL Final          Passed - HDL in normal range and within 360 days    HDL  Date Value Ref Range Status  07/22/2020 68 >39 mg/dL Final          Passed - Triglycerides in normal range and within 360 days    Triglycerides  Date Value Ref Range Status  07/22/2020 89 0 - 149 mg/dL Final          Passed - Patient is not pregnant      Passed - Valid encounter within last 12 months    Recent Outpatient Visits           2 weeks ago Drug-induced constipation   [facility] [name], MD   5 months ago Annual physical exam   [facility] [name], MD   6 months ago Localized edema   [facility] [name], MD   1 year ago Blister (nonthermal), right great toe, initial encounter   [facility] [name], MD   1 year ago Medicare annual wellness visit, subsequent   [facility] [name], MD       Future Appointments             In 1 week [name], [name], MD [facility] [place]    In 7 months Grayland Ormond, Kathlene November, MD [facility]               dutasteride (AVODART) 0.5 MG capsule [Pharmacy Med Name: Dutasteride 0.5 MG Oral Capsule] 90 capsule 0    Sig: Take 1 capsule by mouth once daily      Urology: 5-alpha Reductase Inhibitors Passed - 01/22/2021  3:42 PM      Passed - Valid encounter within last 12 months    Recent Outpatient Visits           2 weeks ago Drug-induced constipation   [facility] [name], MD   5 months ago Annual physical exam   [facility] [name], MD   6 months ago Localized edema   [facility] [name], MD   1 year ago Blister (nonthermal), right great toe, initial encounter   [facility] [name], MD   1 year ago Medicare annual wellness visit, subsequent  [place], MD       Future Appointments             In 1 week Caryn Section, [name], MD [facility]   In 7 months Grayland Ormond, Kathlene November, MD [facility]               amLODipine [facility]) 2.5 MG tablet [Pharmacy Med Name: amLODIPine Besylate 2.5 MG Oral Tablet] 90 tablet 0    Sig: Take 1 tablet by mouth once daily      Cardiovascular:  Calcium Channel Blockers Passed - 01/22/2021  3:42 PM      Passed - Last BP in normal range    BP Readings from Last 1 Encounters:  12/29/20 119/81          Passed - Valid encounter within last 6 months    Recent Outpatient Visits           2 weeks ago Drug-induced constipation   [facility] [name], MD   5 months ago Annual physical exam   [facility] [name], MD   6 months ago Localized edema   [facility] [name], MD   1 year ago Blister (nonthermal), right great toe, initial encounter   [facility] [name], MD   1 year  ago Medicare annual wellness visit, subsequent   [facility] [name], MD       Future Appointments             In 1 week [name], [name], MD [facility] [place]   In 7 months Grayland Ormond, Kathlene November, MD [facility]

## 2021-01-23 ENCOUNTER — Telehealth: Payer: Self-pay

## 2021-01-23 NOTE — Telephone Encounter (Signed)
[facility] faxed refill request for the following medications:  90 day supply  lovastatin (MEVACOR) 40 MG tablet amLODipine (NORVASC) 2.5 MG tablet dutasteride (AVODART) 0.5 MG capsule  Please advise. Thanks, [employer]

## 2021-01-23 NOTE — Telephone Encounter (Signed)
Order is pending for Dr Caryn Section.

## 2021-01-26 ENCOUNTER — Telehealth: Payer: Self-pay

## 2021-01-26 DIAGNOSIS — I10 Essential (primary) hypertension: Secondary | ICD-10-CM

## 2021-01-26 MED ORDER — AMLODIPINE BESYLATE 2.5 MG PO TABS: 2.5000 mg | ORAL_TABLET | Freq: Every day | ORAL | 4 refills | Status: DC

## 2021-01-26 NOTE — Telephone Encounter (Signed)
Pt has called back [phone] wanting to FU from Friday's med refill request

## 2021-01-26 NOTE — Telephone Encounter (Signed)
[facility] faxed refill request for the following medications:  amLODipine (NORVASC) 2.5 MG tablet  Please advise. Thanks, [employer]

## 2021-01-27 DIAGNOSIS — Z471 Aftercare following joint replacement surgery: Secondary | ICD-10-CM | POA: Diagnosis not present

## 2021-01-27 NOTE — Telephone Encounter (Signed)
Prescription have been sent into pharmacy by Dr. Caryn Section. Patient advised.

## 2021-02-02 ENCOUNTER — Other Ambulatory Visit: Payer: Self-pay

## 2021-02-02 ENCOUNTER — Ambulatory Visit (INDEPENDENT_AMBULATORY_CARE_PROVIDER_SITE_OTHER): Payer: Medicare HMO | Admitting: Family Medicine

## 2021-02-02 ENCOUNTER — Encounter: Payer: Self-pay | Admitting: Family Medicine

## 2021-02-02 VITALS — BP 106/67 | HR 79 | Temp 98.1°F | Resp 20 | Wt 220.8 lb

## 2021-02-02 DIAGNOSIS — J9611 Chronic respiratory failure with hypoxia: Secondary | ICD-10-CM

## 2021-02-02 DIAGNOSIS — E039 Hypothyroidism, unspecified: Secondary | ICD-10-CM

## 2021-02-02 DIAGNOSIS — I10 Essential (primary) hypertension: Secondary | ICD-10-CM | POA: Diagnosis not present

## 2021-02-02 DIAGNOSIS — I2699 Other pulmonary embolism without acute cor pulmonale: Secondary | ICD-10-CM | POA: Diagnosis not present

## 2021-02-02 MED ORDER — ELIQUIS DVT/PE STARTER PACK 5 MG PO TBPK
ORAL_TABLET | ORAL | Status: DC
Start: 2021-01-14 — End: 2021-03-20

## 2021-02-02 MED ORDER — APIXABAN 5 MG PO TABS: 5.0000 mg | ORAL_TABLET | Freq: Two times a day (BID) | ORAL | 1 refills | Status: DC

## 2021-02-02 NOTE — Progress Notes (Signed)
Established patient visit   Patient: Daniel Carr   DOB: 1935-04-23   86 y.o. Male  MRN: [id number] Visit Date: 02/02/2021  Today's healthcare provider: Lelon Huh, MD   Chief Complaint  Patient presents with  . Hospitalization Follow-up  . Hypertension   Subjective    HPI  Follow up Hospitalization  Patient was admitted to [facility] on 01/14/2021 and discharged on 01/15/2021. He was treated for acute respiratory failure, dyspnea and pulmonary embolism.Thought to be secondary to reduced mobility after knee replacement surgery.      Patient was prescribed 1 month of Eliquis and advised to have      PCP follow up to manage remaining 2 months of treatment.       Patient was discharged home with Home Health and/or PT/OT. Telephone follow up was not done. He reports good compliance with treatment. Patient has an appointment to follow up with pulmonology on 02/04/2021.  He reports this condition is improved.  However he still gets very short of breath with exertion and unable to due PT. Reports home oxygen saturation drops to low 80s when ambulating without oxygen. Currently using loaner oxygen concentrator.  -----------------------------------------------------------------------------------------    Hypertension, follow-up  BP Readings from Last 3 Encounters:  02/02/21 106/67  12/29/20 119/81  12/16/20 100/78   Wt Readings from Last 3 Encounters:  02/02/21 220 lb 12.8 oz (100.2 kg)  12/24/20 208 lb (94.3 kg)  12/16/20 208 lb 14.4 oz (94.8 kg)     He was last seen for hypertension 6 months ago.  BP at that visit was 148/73. Management since that visit includes continuing same medications.  He reports good compliance with treatment. He is not having side effects.  He is following a Regular diet. He is not exercising. He does not smoke.  Use of agents associated with hypertension: thyroid hormones.   Outside blood pressures are not checked. Symptoms: No  chest pain No chest pressure  No palpitations No syncope  Yes dyspnea Yes orthopnea  No paroxysmal nocturnal dyspnea Yes lower extremity edema   Pertinent labs: Lab Results  Component Value Date   CHOL 149 07/22/2020   HDL 68 07/22/2020   LDLCALC 64 07/22/2020   TRIG 89 07/22/2020   CHOLHDL 2.2 07/22/2020   Lab Results  Component Value Date   NA 135 12/16/2020   K 3.7 12/16/2020   CREATININE 1.00 12/16/2020   GFRNONAA >60 12/16/2020   GFRAA 66 07/22/2020   GLUCOSE 98 12/16/2020     The ASCVD Risk score (Goff DC Jr., et al., 2013) failed to calculate for the following reasons:   The 2013 ASCVD risk score is only valid for ages 12 to 66   ---------------------------------------------------------------------------------------------------  Hypothyroid, follow-up  Lab Results  Component Value Date   TSH 1.190 07/22/2020   TSH 0.807 06/11/2019   TSH 1.870 05/10/2018   FREET4 1.85 (H) 07/22/2020   Wt Readings from Last 3 Encounters:  02/02/21 220 lb 12.8 oz (100.2 kg)  12/24/20 208 lb (94.3 kg)  12/16/20 208 lb 14.4 oz (94.8 kg)    He was last seen for hypothyroid 6 months ago.  Management since that visit includes continue same medication. He reports good compliance with treatment. He is not having side effects.   Symptoms: Yes change in energy level No constipation  No diarrhea No heat / cold intolerance  No nervousness No palpitations  No weight changes    -----------------------------------------------------------------------------------------  Medications: Outpatient Medications Prior to Visit  Medication Sig  . amLODipine (NORVASC) 2.5 MG tablet Take 1 tablet (2.5 mg total) by mouth daily.  [name] Apixaban Starter Pack, 10mg  and 5mg , (ELIQUIS DVT/PE STARTER PACK) Take 2 tablets (10 mg total) by mouth twice daily for 7 days, then take 1 tablet (5 mg) by mouth twice daily  . azelastine (ASTELIN) 0.1 % nasal spray Place 1 spray into the nose daily as  needed (allergies).  . bimatoprost (LUMIGAN) 0.01 % SOLN Place 1 drop into both eyes at bedtime.  . brimonidine (ALPHAGAN) 0.2 % ophthalmic solution Place 1 drop into both eyes in the morning and at bedtime.  . COD LIVER OIL PO Take 1 capsule by mouth daily.  [name] dutasteride (AVODART) 0.5 MG capsule Take 1 capsule (0.5 mg total) by mouth daily.  . hydrochlorothiazide (HYDRODIURIL) 25 MG tablet TAKE 1/2 (ONE-HALF) TABLET BY MOUTH ONCE DAILY . APPOINTMENT REQUIRED FOR FUTURE REFILLS (Patient taking differently: Take 12.5 mg by mouth daily.)  . ipratropium (ATROVENT) 0.06 % nasal spray Place 1 spray into both nostrils 2 (two) times daily as needed for rhinitis.  [name] levocetirizine (XYZAL) 5 MG tablet Take 5 mg by mouth every evening.  . lovastatin (MEVACOR) 40 MG tablet Take 1 tablet (40 mg total) by mouth daily.  . Multiple Vitamin (DAILY VITAMIN PO) Take 1 tablet by mouth daily.  [name] oxyCODONE (OXY IR/ROXICODONE) 5 MG immediate release tablet Take 1 tablet (5 mg total) by mouth every 4 (four) hours as needed for severe pain.  [name] SYNTHROID 125 MCG tablet Take 1 tablet (125 mcg total) by mouth daily. (Patient taking differently: Take 125 mcg by mouth daily before breakfast.)  . traMADol (ULTRAM) 50 MG tablet Take 1 tablet (50 mg total) by mouth every 4 (four) hours as needed for moderate pain.  . Travoprost, BAK Free, (TRAVATAN) 0.004 % SOLN ophthalmic solution Apply 1 drop to eye at bedtime.  . [DISCONTINUED] celecoxib (CELEBREX) 200 MG capsule Take 1 capsule (200 mg total) by mouth 2 (two) times daily. (Patient not taking: Reported on 02/02/2021)  . [DISCONTINUED] enoxaparin (LOVENOX) 40 MG/0.4ML injection Inject 0.4 mLs (40 mg total) into the skin daily for 14 days. (Patient not taking: Reported on 01/06/2021)   No facility-administered medications prior to visit.    Review of Systems  Constitutional: Positive for fatigue. Negative for appetite change, chills and fever.  Respiratory: Positive for cough  (dry cough) and shortness of breath. Negative for chest tightness and wheezing.   Cardiovascular: Positive for leg swelling. Negative for chest pain and palpitations.  Gastrointestinal: Negative for abdominal pain, nausea and vomiting.       Objective    BP 106/67 (BP Location: Left Arm, Patient Position: Sitting, Cuff Size: Normal)   Pulse 79   Temp 98.1 F (36.7 C) (Temporal)   Resp 20   Wt 220 lb 12.8 oz (100.2 kg)   SpO2 98% Comment: 1 L 02 nasal canula  BMI 29.13 kg/m   See oximetry flowsheet.  Physical Exam   General: Appearance:     Well developed, well nourished male in no acute distress  Eyes:    PERRL, conjunctiva/corneas clear, EOM's intact       Lungs:     Clear to auscultation bilaterally, respirations unlabored  Heart:    Normal heart rate. Normal rhythm.  2/6 systolic murmur  MS:   All extremities are intact.   Neurologic:   Awake, alert, oriented x 3. No apparent focal neurological  defect.         Assessment & Plan     1. Other acute pulmonary embolism without acute cor pulmonale (HCC) Anticoagulation initiated 01/14/2021 anticipating minimum of 3 months therapy. This is his first PE thromboembolic event and considered to be provoked by reduced ambulation following knee replacement surgery.  - CBC - Comprehensive metabolic panel  2. Chronic respiratory failure with hypoxia (HCC) Secondary to DVD. Still requires supplemental oxygen with ambulation.   3. Hypertension, benign Well controlled.  Continue current medications.   - TSH  4. Hypothyroidism, unspecified type  - TSH   Follow up 1 month.       The entirety of the information documented in the History of Present Illness, Review of Systems and Physical Exam were personally obtained by me. Portions of this information were initially documented by the CMA and reviewed by me for thoroughness and accuracy.      Lelon Huh, MD  [facility] [phone]  (phone) [phone] (fax)  [place]

## 2021-02-02 NOTE — Chronic Care Management (AMB) (Signed)
  Chronic Care Management   Note   Name: Daniel Carr MRN: [id number] DOB: [birth year]   Very brief outreach with [name] daughter [name]. Reports he is doing well. Denies urgent concerns. Reports being informed that he will not be able to resume therapy until cleared by Dr. Caryn Section. Requested assistance with updating the Physical Therapy team and cancelling the pending therapy appointments. Call placed to the [facility] Physical [facility]. Pending therapy appointments cancelled. Team informed of [name] pending PCP follow-up with Dr. Caryn Section on 02/02/21.    Follow up plan: A member of the care management team will follow-up within the next 30 days.  Cristy Friedlander [facility] Care Management [facility] [phone]

## 2021-02-04 DIAGNOSIS — R0600 Dyspnea, unspecified: Secondary | ICD-10-CM | POA: Diagnosis not present

## 2021-02-04 DIAGNOSIS — R0602 Shortness of breath: Secondary | ICD-10-CM | POA: Diagnosis not present

## 2021-02-05 DIAGNOSIS — I2699 Other pulmonary embolism without acute cor pulmonale: Secondary | ICD-10-CM | POA: Diagnosis not present

## 2021-02-05 DIAGNOSIS — E039 Hypothyroidism, unspecified: Secondary | ICD-10-CM | POA: Diagnosis not present

## 2021-02-05 DIAGNOSIS — I10 Essential (primary) hypertension: Secondary | ICD-10-CM | POA: Diagnosis not present

## 2021-02-05 DIAGNOSIS — Z96652 Presence of left artificial knee joint: Secondary | ICD-10-CM | POA: Diagnosis not present

## 2021-02-06 LAB — COMPREHENSIVE METABOLIC PANEL
ALT: 6 IU/L (ref 0–44)
AST: 18 IU/L (ref 0–40)
Albumin/Globulin Ratio: 1.4 (ref 1.2–2.2)
Albumin: 3.7 g/dL (ref 3.6–4.6)
Alkaline Phosphatase: 45 IU/L (ref 44–121)
BUN/Creatinine Ratio: 14 (ref 10–24)
BUN: 13 mg/dL (ref 8–27)
Bilirubin Total: 0.4 mg/dL (ref 0.0–1.2)
CO2: 31 mmol/L — ABNORMAL HIGH (ref 20–29)
Calcium: 9 mg/dL (ref 8.6–10.2)
Chloride: 95 mmol/L — ABNORMAL LOW (ref 96–106)
Creatinine, Ser: 0.94 mg/dL (ref 0.76–1.27)
GFR calc Af Amer: 86 mL/min/{1.73_m2} (ref >59)
GFR calc non Af Amer: 75 mL/min/{1.73_m2} (ref >59)
Globulin, Total: 2.7 g/dL (ref 1.5–4.5)
Glucose: 138 mg/dL — ABNORMAL HIGH (ref 65–99)
Potassium: 3.7 mmol/L (ref 3.5–5.2)
Sodium: 138 mmol/L (ref 134–144)
Total Protein: 6.4 g/dL (ref 6.0–8.5)

## 2021-02-06 LAB — CBC
Hematocrit: 30.3 % — ABNORMAL LOW (ref 37.5–51.0)
Hemoglobin: 10.3 g/dL — ABNORMAL LOW (ref 13.0–17.7)
MCH: 32.1 pg (ref 26.6–33.0)
MCHC: 34 g/dL (ref 31.5–35.7)
MCV: 94 fL (ref 79–97)
Platelets: 221 10*3/uL (ref 150–450)
RBC: 3.21 x10E6/uL — ABNORMAL LOW (ref 4.14–5.80)
RDW: 12.8 % (ref 11.6–15.4)
WBC: 6.5 10*3/uL (ref 3.4–10.8)

## 2021-02-06 LAB — TSH: TSH: 5.2 u[IU]/mL — ABNORMAL HIGH (ref 0.450–4.500)

## 2021-02-11 ENCOUNTER — Telehealth: Payer: Self-pay

## 2021-02-11 NOTE — Telephone Encounter (Signed)
Copied from [place] [id number]. Topic: General - Other >> Feb 11, 2021  3:20 PM Yvette Rack wrote: Reason for CRM: Pt called for an update on his most recent lab results. Pt requests call back.

## 2021-02-11 NOTE — Telephone Encounter (Signed)
Lab results in chart awaiting provider review.

## 2021-02-12 DIAGNOSIS — R0600 Dyspnea, unspecified: Secondary | ICD-10-CM | POA: Diagnosis not present

## 2021-02-12 NOTE — Telephone Encounter (Signed)
See result note.  

## 2021-02-15 DIAGNOSIS — I2693 Single subsegmental pulmonary embolism without acute cor pulmonale: Secondary | ICD-10-CM | POA: Diagnosis not present

## 2021-02-15 DIAGNOSIS — R0600 Dyspnea, unspecified: Secondary | ICD-10-CM | POA: Diagnosis not present

## 2021-02-15 DIAGNOSIS — J9601 Acute respiratory failure with hypoxia: Secondary | ICD-10-CM | POA: Diagnosis not present

## 2021-02-16 NOTE — Chronic Care Management (AMB) (Signed)
  Chronic Care Management   Note   Name: Daniel Carr MRN: [id number] DOB: [birth year]   Care Coordination Only: Respite Care [name] reports his surgery date has been confirmed. Anticipates completing knee replacement surgery on 12/24/20. Reports the Orthopedic team advised transition to a rehabilitation facility following his procedure. He serves as the primary caregiver for his spouse who is currently being treated for Dementia. We previously discussed possible need for respite care during his recovery. Declines current need for assistance. Reports his daughter and a private pay sitter will be available to assist his wife during his recovery.  Agreed to update the team if additional assistance is needed.     Cristy Friedlander [facility] Care Management [facility] [phone]

## 2021-02-23 ENCOUNTER — Ambulatory Visit (INDEPENDENT_AMBULATORY_CARE_PROVIDER_SITE_OTHER): Payer: Medicare HMO

## 2021-02-23 ENCOUNTER — Ambulatory Visit: Payer: Self-pay | Admitting: Family Medicine

## 2021-02-23 DIAGNOSIS — M1990 Unspecified osteoarthritis, unspecified site: Secondary | ICD-10-CM

## 2021-02-23 DIAGNOSIS — Z96652 Presence of left artificial knee joint: Secondary | ICD-10-CM

## 2021-02-23 DIAGNOSIS — I10 Essential (primary) hypertension: Secondary | ICD-10-CM

## 2021-02-23 DIAGNOSIS — I2699 Other pulmonary embolism without acute cor pulmonale: Secondary | ICD-10-CM

## 2021-02-23 NOTE — Chronic Care Management (AMB) (Signed)
Chronic Care Management   Follow Up Note   02/23/2021 Name: Daniel Carr MRN: [id number] DOB: [birth year]  Primary Care Provider: [name], MD Reason for referral : Chronic Care Management   Daniel Carr is a 86 y.o. year old male who is a primary care patient of [name], MD. He is currently enrolled in the Chronic Care Management program.  Review of Daniel Carr status, including review of consultants reports, relevant labs and test results was conducted today. Collaboration with appropriate care team members was performed as part of the comprehensive evaluation and provision of chronic care management services.    SDOH (Social Determinants of Health) assessments performed: No    Outpatient Encounter Medications as of 02/23/2021  Medication Sig  . amLODipine (NORVASC) 2.5 MG tablet Take 1 tablet (2.5 mg total) by mouth daily.  [name] apixaban (ELIQUIS) 5 MG TABS tablet Take 1 tablet (5 mg total) by mouth 2 (two) times daily.  [name] Apixaban Starter Pack, 10mg  and 5mg , (ELIQUIS DVT/PE STARTER PACK) Take 2 tablets (10 mg total) by mouth twice daily for 7 days, then take 1 tablet (5 mg) by mouth twice daily  . azelastine (ASTELIN) 0.1 % nasal spray Place 1 spray into the nose daily as needed (allergies).  . bimatoprost (LUMIGAN) 0.01 % SOLN Place 1 drop into both eyes at bedtime.  . brimonidine (ALPHAGAN) 0.2 % ophthalmic solution Place 1 drop into both eyes in the morning and at bedtime.  . COD LIVER OIL PO Take 1 capsule by mouth daily.  [name] dutasteride (AVODART) 0.5 MG capsule Take 1 capsule (0.5 mg total) by mouth daily.  . hydrochlorothiazide (HYDRODIURIL) 25 MG tablet TAKE 1/2 (ONE-HALF) TABLET BY MOUTH ONCE DAILY . APPOINTMENT REQUIRED FOR FUTURE REFILLS (Patient taking differently: Take 12.5 mg by mouth daily.)  . ipratropium (ATROVENT) 0.06 % nasal spray Place 1 spray into both nostrils 2 (two) times daily as needed for rhinitis.  [name] levocetirizine (XYZAL) 5 MG tablet Take  5 mg by mouth every evening.  . lovastatin (MEVACOR) 40 MG tablet Take 1 tablet (40 mg total) by mouth daily.  . Multiple Vitamin (DAILY VITAMIN PO) Take 1 tablet by mouth daily.  [name] oxyCODONE (OXY IR/ROXICODONE) 5 MG immediate release tablet Take 1 tablet (5 mg total) by mouth every 4 (four) hours as needed for severe pain.  [name] SYNTHROID 125 MCG tablet Take 1 tablet (125 mcg total) by mouth daily. (Patient taking differently: Take 125 mcg by mouth daily before breakfast.)  . traMADol (ULTRAM) 50 MG tablet Take 1 tablet (50 mg total) by mouth every 4 (four) hours as needed for moderate pain.  . Travoprost, BAK Free, (TRAVATAN) 0.004 % SOLN ophthalmic solution Apply 1 drop to eye at bedtime.   No facility-administered encounter medications on file as of 02/23/2021.     Objective:  Patient Care Plan: Osteoarthritis (Adult)    Problem Identified: Mobility and Function (Osteoarthritis)     Long-Range Goal: Improve Mobility/Recent Left Knee Replacement   Start Date: 01/22/2021  Expected End Date: 03/24/2021  Priority: High  Note:   Current Barriers:  Fall Risk r/t Total Left Knee Replacement  Unable to perform IADLs independently  Clinical Goal(s):   Over the next 60 days, patient will not experience falls or require emergent care d/t fall related injuries.   Interventions:  . Collaboration with [name], MD regarding development and update of comprehensive plan of care as evidenced by provider attestation and co-signature . Inter-disciplinary care team  collaboration (see longitudinal plan of care) . Reviewed medications and potential side effects such as dizziness, lightheadedness and frequent urination. . Reviewed safety and fall prevention measures. Denies recent falls. Reports following recommended precautions. He still experiences knee stiffness but reports ambulation has significantly improved. Continues to use his walker. His daughter is available to assist and provide  transportation as needed. Declines need for additional assistance in the home. . Discussed plan for Physical Therapy/Rehab. Sessions were put on hold d/t suspected pulmonary embolism. He reports doing well since the initial evaluation. He anticipates restarting therapy this week. Denies chest pain or palpitations. He continues to experience exertional dyspnea but feels he will be able to tolerate rehab sessions. Reports oxygen saturations in the high 90's   Patient Goals/Self-Care Activities Over the next 60 days, patient will:   -Utilize walker appropriately with all ambulation -Change positions slowly -Wear secure non slip footwear at all times with ambulation -Utilize home lighting for dim lit areas -Resume outpatient physical therapy  Follow Up Plan:  A member of the care management team will follow-up with Daniel Carr next month.        PLAN A member of the care management team will follow up with Daniel Carr next month.     Daniel Carr [facility] Care Management [facility] [phone]

## 2021-02-24 DIAGNOSIS — M25562 Pain in left knee: Secondary | ICD-10-CM | POA: Diagnosis not present

## 2021-02-24 DIAGNOSIS — Z96652 Presence of left artificial knee joint: Secondary | ICD-10-CM | POA: Diagnosis not present

## 2021-02-24 DIAGNOSIS — M25662 Stiffness of left knee, not elsewhere classified: Secondary | ICD-10-CM | POA: Diagnosis not present

## 2021-02-24 DIAGNOSIS — M6281 Muscle weakness (generalized): Secondary | ICD-10-CM | POA: Diagnosis not present

## 2021-02-27 DIAGNOSIS — Z96652 Presence of left artificial knee joint: Secondary | ICD-10-CM | POA: Diagnosis not present

## 2021-02-27 DIAGNOSIS — H401132 Primary open-angle glaucoma, bilateral, moderate stage: Secondary | ICD-10-CM | POA: Diagnosis not present

## 2021-02-27 DIAGNOSIS — M25562 Pain in left knee: Secondary | ICD-10-CM | POA: Diagnosis not present

## 2021-03-02 DIAGNOSIS — M25562 Pain in left knee: Secondary | ICD-10-CM | POA: Diagnosis not present

## 2021-03-02 DIAGNOSIS — Z96652 Presence of left artificial knee joint: Secondary | ICD-10-CM | POA: Diagnosis not present

## 2021-03-06 ENCOUNTER — Ambulatory Visit: Payer: Self-pay | Admitting: Family Medicine

## 2021-03-06 DIAGNOSIS — Z96652 Presence of left artificial knee joint: Secondary | ICD-10-CM | POA: Diagnosis not present

## 2021-03-06 DIAGNOSIS — M25562 Pain in left knee: Secondary | ICD-10-CM | POA: Diagnosis not present

## 2021-03-06 NOTE — Patient Instructions (Signed)
Thank you for allowing the Chronic Care Management team to participate in your care.    Patient Care Plan: Osteoarthritis (Adult)    Problem Identified: Mobility and Function (Osteoarthritis)     Long-Range Goal: Improve Mobility/Recent Left Knee Replacement   Start Date: 01/22/2021  Expected End Date: 03/24/2021  Priority: High  Note:   Current Barriers:  Fall Risk r/t Total Left Knee Replacement  Unable to perform IADLs independently  Clinical Goal(s):   Over the next 60 days, patient will not experience falls or require emergent care d/t fall related injuries.   Interventions:  . Collaboration with [name], MD regarding development and update of comprehensive plan of care as evidenced by provider attestation and co-signature . Inter-disciplinary care team collaboration (see longitudinal plan of care) . Reviewed medications and potential side effects such as dizziness, lightheadedness and frequent urination. . Reviewed safety and fall prevention measures. Denies recent falls. Reports following recommended precautions. He still experiences knee stiffness but reports ambulation has significantly improved. Continues to use his walker. His daughter is available to assist and provide transportation as needed. Declines need for additional assistance in the home. . Discussed plan for Physical Therapy/Rehab. Sessions were put on hold d/t suspected pulmonary embolism. He reports doing well since the initial evaluation. He anticipates restarting therapy this week. Denies chest pain or palpitations. He continues to experience exertional dyspnea but feels he will be able to tolerate rehab sessions. Reports oxygen saturations in the high 90's   Patient Goals/Self-Care Activities Over the next 60 days, patient will:   -Utilize walker appropriately with all ambulation -Change positions slowly -Wear secure non slip footwear at all times with ambulation -Utilize home lighting for dim lit  areas -Resume outpatient physical therapy  Follow Up Plan:  A member of the care management team will follow-up with Mr. Ishler next month.         Mr. Fouch verbalized understanding of the information discussed during the telephonic outreach today. Declined need for mailed/printed instructions. A member of the care management team will follow up with Mr. Colquhoun next month.    Cristy Friedlander [facility] Care Management [facility] [phone]

## 2021-03-09 DIAGNOSIS — M25562 Pain in left knee: Secondary | ICD-10-CM | POA: Diagnosis not present

## 2021-03-09 DIAGNOSIS — Z96652 Presence of left artificial knee joint: Secondary | ICD-10-CM | POA: Diagnosis not present

## 2021-03-13 DIAGNOSIS — Z96652 Presence of left artificial knee joint: Secondary | ICD-10-CM | POA: Diagnosis not present

## 2021-03-13 DIAGNOSIS — M25562 Pain in left knee: Secondary | ICD-10-CM | POA: Diagnosis not present

## 2021-03-18 DIAGNOSIS — Z96652 Presence of left artificial knee joint: Secondary | ICD-10-CM | POA: Diagnosis not present

## 2021-03-20 ENCOUNTER — Encounter: Payer: Self-pay | Admitting: Family Medicine

## 2021-03-20 ENCOUNTER — Ambulatory Visit (INDEPENDENT_AMBULATORY_CARE_PROVIDER_SITE_OTHER): Payer: Medicare HMO | Admitting: Family Medicine

## 2021-03-20 ENCOUNTER — Other Ambulatory Visit: Payer: Self-pay

## 2021-03-20 VITALS — BP 113/69 | HR 86 | Temp 97.9°F | Resp 22 | Wt 212.2 lb

## 2021-03-20 DIAGNOSIS — M25562 Pain in left knee: Secondary | ICD-10-CM | POA: Diagnosis not present

## 2021-03-20 DIAGNOSIS — D649 Anemia, unspecified: Secondary | ICD-10-CM

## 2021-03-20 DIAGNOSIS — Z96652 Presence of left artificial knee joint: Secondary | ICD-10-CM | POA: Diagnosis not present

## 2021-03-20 DIAGNOSIS — R0602 Shortness of breath: Secondary | ICD-10-CM

## 2021-03-20 DIAGNOSIS — I2699 Other pulmonary embolism without acute cor pulmonale: Secondary | ICD-10-CM | POA: Diagnosis not present

## 2021-03-20 MED ORDER — APIXABAN 5 MG PO TABS: 5.0000 mg | ORAL_TABLET | Freq: Two times a day (BID) | ORAL | 0 refills | Status: DC

## 2021-03-20 NOTE — Progress Notes (Signed)
Established patient visit   Patient: Daniel Carr   DOB: 12/01/1935   86 y.o. Male  MRN: [id number] Visit Date: 03/20/2021  Today's healthcare provider: Lelon Huh, MD   Chief Complaint  Patient presents with  . Anemia   Subjective    HPI  Follow up for Anemia:  The patient was last seen for this 1 month ago. During that visit labs were ordered showing slight anemia. No changes were made at that time.  He reports good compliance with treatment. He feels that condition is Unchanged. He is not having side effects.   -----------------------------------------------------------------------------------------  Follow up for pulmonary embolism:  The patient was last seen for this 1 month ago. He was found to have PE after knee replacement surgery in January. Changes made at last visit includes continuing Eliquis.  He reports good compliance with treatment. He feels that condition is Unchanged. He is not having side effects.   -----------------------------------------------------------------------------------------  Follow up for Chronic respiratory failure with hypoxia:  The patient was last seen for this 1 month ago. Changes made at last visit include none; continue using supplemental oxygen with ambulation.  He reports good compliance with treatment. Since last visit patient has come of supplemental oxygen.  He feels that condition is Improved. However he still gets short of breath. Is scheduled for Sniff test in [facility]. He is not having side effects.   -----------------------------------------------------------------------------------------        Medications: Outpatient Medications Prior to Visit  Medication Sig  . amLODipine (NORVASC) 2.5 MG tablet Take 1 tablet (2.5 mg total) by mouth daily.  [name] apixaban (ELIQUIS) 5 MG TABS tablet Take 1 tablet (5 mg total) by mouth 2 (two) times daily.  [name] azelastine (ASTELIN) 0.1 % nasal spray Place 1 spray into  the nose daily as needed (allergies).  . bimatoprost (LUMIGAN) 0.01 % SOLN Place 1 drop into both eyes at bedtime.  . brimonidine (ALPHAGAN) 0.2 % ophthalmic solution Place 1 drop into both eyes in the morning and at bedtime.  . COD LIVER OIL PO Take 1 capsule by mouth daily.  [name] dutasteride (AVODART) 0.5 MG capsule Take 1 capsule (0.5 mg total) by mouth daily.  . hydrochlorothiazide (HYDRODIURIL) 25 MG tablet TAKE 1/2 (ONE-HALF) TABLET BY MOUTH ONCE DAILY . APPOINTMENT REQUIRED FOR FUTURE REFILLS (Patient taking differently: Take 12.5 mg by mouth daily.)  . ipratropium (ATROVENT) 0.06 % nasal spray Place 1 spray into both nostrils 2 (two) times daily as needed for rhinitis.  [name] levocetirizine (XYZAL) 5 MG tablet Take 5 mg by mouth every evening.  . lovastatin (MEVACOR) 40 MG tablet Take 1 tablet (40 mg total) by mouth daily.  . Multiple Vitamin (DAILY VITAMIN PO) Take 1 tablet by mouth daily.  [name] SYNTHROID 125 MCG tablet Take 1 tablet (125 mcg total) by mouth daily. (Patient taking differently: Take 125 mcg by mouth daily before breakfast.)  . traMADol (ULTRAM) 50 MG tablet Take 1 tablet (50 mg total) by mouth every 4 (four) hours as needed for moderate pain.  . Travoprost, BAK Free, (TRAVATAN) 0.004 % SOLN ophthalmic solution Apply 1 drop to eye at bedtime.  . [DISCONTINUED] Apixaban Starter Pack, 10mg  and 5mg , (ELIQUIS DVT/PE STARTER PACK) Take 2 tablets (10 mg total) by mouth twice daily for 7 days, then take 1 tablet (5 mg) by mouth twice daily  . [DISCONTINUED] oxyCODONE (OXY IR/ROXICODONE) 5 MG immediate release tablet Take 1 tablet (5 mg total) by mouth every 4 (four)  hours as needed for severe pain.   No facility-administered medications prior to visit.    Review of Systems  Constitutional: Positive for fatigue. Negative for appetite change, chills and fever.  Respiratory: Positive for shortness of breath. Negative for chest tightness and wheezing.   Cardiovascular: Positive for leg  swelling (in legs). Negative for chest pain and palpitations.  Gastrointestinal: Negative for abdominal pain, nausea and vomiting.       Objective    BP 113/69 (BP Location: Left Arm, Patient Position: Sitting, Cuff Size: Normal)   Pulse 86   Temp 97.9 F (36.6 C) (Temporal)   Resp (!) 22   Wt 212 lb 3.2 oz (96.3 kg)   SpO2 99% Comment: room air  BMI 28.00 kg/m     Physical Exam    General: Appearance:     Overweight male in no acute distress  Eyes:    PERRL, conjunctiva/corneas clear, EOM's intact       Lungs:     Clear to auscultation bilaterally, respirations unlabored  Heart:    Normal heart rate. Normal rhythm.  2/6 systolic murmur  MS:   All extremities are intact. 2+ edema left LE, 1+ on right.   Neurologic:   Awake, alert, oriented x 3. No apparent focal neurological           defect.         Assessment & Plan     1. Other acute pulmonary embolism without acute cor pulmonale (HCC) Doing well on third month of Eliquis. PE is considered to be provoked due to knee surgery. He and his daughter or concerned about duration particularly since he is still having some shortness of breath and prefer longer duration. Counseled on potential risks of anti-coagulation. Will continue NOAC for and additional 90 days then d/c.  - apixaban (ELIQUIS) 5 MG TABS tablet; Take 1 tablet (5 mg total) by mouth 2 (two) times daily.  Dispense: 180 tablet; Refill: 0  2. Anemia, unspecified type  - CBC  3. Shortness of breath Slowly improving since PE diagnosis, no longer requring suplemental oxygen, but not back to baseline. Continue pulmonary follow up as scheduled.          The entirety of the information documented in the History of Present Illness, Review of Systems and Physical Exam were personally obtained by me. Portions of this information were initially documented by the CMA and reviewed by me for thoroughness and accuracy.      Lelon Huh, MD  [facility] [phone] (phone) [phone] (fax)  [place]

## 2021-03-21 LAB — CBC
Hematocrit: 35.6 % — ABNORMAL LOW (ref 37.5–51.0)
Hemoglobin: 11.9 g/dL — ABNORMAL LOW (ref 13.0–17.7)
MCH: 30.6 pg (ref 26.6–33.0)
MCHC: 33.4 g/dL (ref 31.5–35.7)
MCV: 92 fL (ref 79–97)
Platelets: 206 10*3/uL (ref 150–450)
RBC: 3.89 x10E6/uL — ABNORMAL LOW (ref 4.14–5.80)
RDW: 12.6 % (ref 11.6–15.4)
WBC: 6.1 10*3/uL (ref 3.4–10.8)

## 2021-03-23 ENCOUNTER — Ambulatory Visit: Payer: Self-pay

## 2021-03-23 ENCOUNTER — Telehealth: Payer: Self-pay

## 2021-03-23 DIAGNOSIS — R9389 Abnormal findings on diagnostic imaging of other specified body structures: Secondary | ICD-10-CM | POA: Diagnosis not present

## 2021-03-23 DIAGNOSIS — J42 Unspecified chronic bronchitis: Secondary | ICD-10-CM

## 2021-03-23 DIAGNOSIS — R0600 Dyspnea, unspecified: Secondary | ICD-10-CM | POA: Diagnosis not present

## 2021-03-23 DIAGNOSIS — R937 Abnormal findings on diagnostic imaging of other parts of musculoskeletal system: Secondary | ICD-10-CM | POA: Diagnosis not present

## 2021-03-23 DIAGNOSIS — Z96652 Presence of left artificial knee joint: Secondary | ICD-10-CM

## 2021-03-23 DIAGNOSIS — I10 Essential (primary) hypertension: Secondary | ICD-10-CM

## 2021-03-23 NOTE — Telephone Encounter (Signed)
Called patient and his caregiver(who is not on the DPR) answered the phone and states that patient was out at a doctor's appointment and I advised her that I will call patient back.

## 2021-03-23 NOTE — Telephone Encounter (Signed)
Patient was advised and verbalized understanding. 

## 2021-03-23 NOTE — Telephone Encounter (Signed)
-----   Message from [name], MD sent at 03/23/2021  6:51 AM EDT ----- Blood count is better , nearly back to normal.

## 2021-03-23 NOTE — Chronic Care Management (AMB) (Addendum)
Chronic Care Management   Follow Up Note   03/23/2021 Name: Daniel Carr MRN: [id number] DOB: 02-22-1935  Primary Care Provider: [name], MD Reason for referral : Chronic Care Management   Daniel Carr is a 86 y.o. year old male who is a primary care patient of [name], [name], MD.   Review of [name] status, including review of consultants reports, relevant labs and test results was conducted today. Collaboration with appropriate care team members was performed as part of the comprehensive evaluation and provision of chronic care management services.    SDOH (Social Determinants of Health) assessments performed: No     Outpatient Encounter Medications as of 03/23/2021  Medication Sig  . amLODipine (NORVASC) 2.5 MG tablet Take 1 tablet (2.5 mg total) by mouth daily.  [name] apixaban (ELIQUIS) 5 MG TABS tablet Take 1 tablet (5 mg total) by mouth 2 (two) times daily.  [name] azelastine (ASTELIN) 0.1 % nasal spray Place 1 spray into the nose daily as needed (allergies).  . bimatoprost (LUMIGAN) 0.01 % SOLN Place 1 drop into both eyes at bedtime.  . brimonidine (ALPHAGAN) 0.2 % ophthalmic solution Place 1 drop into both eyes in the morning and at bedtime.  . COD LIVER OIL PO Take 1 capsule by mouth daily.  [name] dutasteride (AVODART) 0.5 MG capsule Take 1 capsule (0.5 mg total) by mouth daily.  . hydrochlorothiazide (HYDRODIURIL) 25 MG tablet TAKE 1/2 (ONE-HALF) TABLET BY MOUTH ONCE DAILY . APPOINTMENT REQUIRED FOR FUTURE REFILLS (Patient taking differently: Take 12.5 mg by mouth daily.)  . ipratropium (ATROVENT) 0.06 % nasal spray Place 1 spray into both nostrils 2 (two) times daily as needed for rhinitis.  [name] levocetirizine (XYZAL) 5 MG tablet Take 5 mg by mouth every evening.  . lovastatin (MEVACOR) 40 MG tablet Take 1 tablet (40 mg total) by mouth daily.  . Multiple Vitamin (DAILY VITAMIN PO) Take 1 tablet by mouth daily.  [name] SYNTHROID 125 MCG tablet Take 1 tablet (125 mcg total) by  mouth daily. (Patient taking differently: Take 125 mcg by mouth daily before breakfast.)  . traMADol (ULTRAM) 50 MG tablet Take 1 tablet (50 mg total) by mouth every 4 (four) hours as needed for moderate pain.  . Travoprost, BAK Free, (TRAVATAN) 0.004 % SOLN ophthalmic solution Apply 1 drop to eye at bedtime.   No facility-administered encounter medications on file as of 03/23/2021.     Objective:  Patient Care Plan: Osteoarthritis (Adult)    Problem Identified: Mobility and Function (Osteoarthritis)     Long-Range Goal: Improve Mobility/Recent Left Knee Replacement   Start Date: 01/22/2021  Expected End Date: 03/24/2021  Priority: High  Current Barriers:  Fall Risk r/t Total Left Knee Replacement  Unable to perform IADLs independently  Clinical Goal(s):   Over the next 60 days, patient will not experience falls or require emergent care d/t fall related injuries.   Interventions:   Collaboration with [name], MD regarding development and update of comprehensive plan of care as evidenced by provider attestation and co-signature  Inter-disciplinary care team collaboration (see longitudinal plan of care)  Reviewed medications and potential side effects. Reports tolerating his medications well and taking as prescribed.   Reviewed safety and fall prevention measures. Denies recent falls. Reports using his assistive device as recommended. Feels that his ability to ambulate has significantly improved. 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 Reports a home care aide is available to help as needed. His family  is also available to assist. Declines need for additional in-home assistance.  Discussed plan for Physical Therapy/Rehab. He has resumed outpatient Physical Therapy. Continues to experience exertional dyspnea but reports otherwise doing well. Denies chest pain or palpitations with activity. Indicates he no longer requires supplemental oxygen. Oxygen  saturations have remained in the high 90's. He remains very motivated to complete therapy and return to his previous functional level.    Patient Goals/Self-Care Activities -Utilize walker appropriately with all ambulation -Change positions slowly -Wear secure non slip footwear at all times with ambulation -Utilize home lighting for dim lit areas -Continue outpatient physical therapy   Follow Up Plan:  [name] has met his care management goals. Agreed to notify his provider or contact the care management team if additional outreach is needed.         PLAN [name] has met his care management goals. Agreed to notify his primary care provider or contact the care management team if additional outreach is needed. The care management team will gladly assist.     [facility] Care Management [facility] [phone]

## 2021-03-30 ENCOUNTER — Telehealth: Payer: Self-pay

## 2021-03-30 DIAGNOSIS — M25562 Pain in left knee: Secondary | ICD-10-CM | POA: Diagnosis not present

## 2021-03-30 DIAGNOSIS — Z96652 Presence of left artificial knee joint: Secondary | ICD-10-CM | POA: Diagnosis not present

## 2021-03-30 NOTE — Telephone Encounter (Signed)
Patient states he had a sniff test done on 03/23/2021. He wants to know if Dr. Caryn Section has received these results yet. It looks like this was ordered by Pulmonology. Patient says he hasn't heard from anyone about the results. Does patient need to contact Pulmonology for results?  Please advise.

## 2021-03-30 NOTE — Patient Instructions (Addendum)
Thank you for allowing the Chronic Care Management team to participate in your care. It was a pleasure speaking with you today. Please feel free to contact me with questions.   Goals Addressed: Patient Care Plan: Osteoarthritis (Adult)    Problem Identified: Mobility and Function (Osteoarthritis)     Long-Range Goal: Improve Mobility/Recent Left Knee Replacement   Start Date: 01/22/2021  Expected End Date: 03/24/2021  Priority: High  Current Barriers:  Fall Risk r/t Total Left Knee Replacement  Unable to perform IADLs independently  Clinical Goal(s):   Over the next 60 days, patient will not experience falls or require emergent care d/t fall related injuries.   Interventions:   Collaboration with [name], MD regarding development and update of comprehensive plan of care as evidenced by provider attestation and co-signature  Inter-disciplinary care team collaboration (see longitudinal plan of care)  Reviewed medications and potential side effects. Reports tolerating his medications well and taking as prescribed.   Reviewed safety and fall prevention measures. Denies recent falls. Reports using his assistive device as recommended. Feels that his ability to ambulate has significantly improved. 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 Reports a home care aide is available to help as needed. His family is also available to assist. Declines need for additional in-home assistance.  Discussed plan for Physical Therapy/Rehab. He has resumed outpatient Physical Therapy. Continues to experience exertional dyspnea but reports otherwise doing well. Denies chest pain or palpitations with activity. Indicates he no longer requires supplemental oxygen. Oxygen saturations have remained in the high 90's. He remains very motivated to complete therapy and return to his previous functional level.    Patient Goals/Self-Care Activities -Utilize walker appropriately with  all ambulation -Change positions slowly -Wear secure non slip footwear at all times with ambulation -Utilize home lighting for dim lit areas -Continue outpatient physical therapy   Follow Up Plan:  Mr. Spackman has met his care management goals. Agreed to notify his provider or contact the care management team if additional outreach is needed.              Mr. Hulgan verbalized understanding of the information discussed during the telephonic outreach today He has met his care management goals. Declined need for mailed/printed instructions. He agreed to notify his primary care provider or contact the care management team if additional outreach is needed. The care management team will gladly assist.     [facility] Care [facility] [phone]

## 2021-04-01 DIAGNOSIS — M25562 Pain in left knee: Secondary | ICD-10-CM | POA: Diagnosis not present

## 2021-04-01 DIAGNOSIS — Z96652 Presence of left artificial knee joint: Secondary | ICD-10-CM | POA: Diagnosis not present

## 2021-04-06 DIAGNOSIS — Z96652 Presence of left artificial knee joint: Secondary | ICD-10-CM | POA: Diagnosis not present

## 2021-04-06 DIAGNOSIS — M25562 Pain in left knee: Secondary | ICD-10-CM | POA: Diagnosis not present

## 2021-04-08 DIAGNOSIS — M25562 Pain in left knee: Secondary | ICD-10-CM | POA: Diagnosis not present

## 2021-04-08 DIAGNOSIS — Z96652 Presence of left artificial knee joint: Secondary | ICD-10-CM | POA: Diagnosis not present

## 2021-04-13 DIAGNOSIS — M25562 Pain in left knee: Secondary | ICD-10-CM | POA: Diagnosis not present

## 2021-04-13 DIAGNOSIS — Z96652 Presence of left artificial knee joint: Secondary | ICD-10-CM | POA: Diagnosis not present

## 2021-04-14 NOTE — Telephone Encounter (Signed)
I don't know how to interpret this test. Patient has appt with pulmonary schedule 04-22-2021

## 2021-04-15 DIAGNOSIS — Z96652 Presence of left artificial knee joint: Secondary | ICD-10-CM | POA: Diagnosis not present

## 2021-04-15 DIAGNOSIS — M25562 Pain in left knee: Secondary | ICD-10-CM | POA: Diagnosis not present

## 2021-04-21 DIAGNOSIS — Z96652 Presence of left artificial knee joint: Secondary | ICD-10-CM | POA: Diagnosis not present

## 2021-04-23 DIAGNOSIS — Z96652 Presence of left artificial knee joint: Secondary | ICD-10-CM | POA: Diagnosis not present

## 2021-04-23 DIAGNOSIS — M25562 Pain in left knee: Secondary | ICD-10-CM | POA: Diagnosis not present

## 2021-04-27 DIAGNOSIS — M25562 Pain in left knee: Secondary | ICD-10-CM | POA: Diagnosis not present

## 2021-04-27 DIAGNOSIS — Z96652 Presence of left artificial knee joint: Secondary | ICD-10-CM | POA: Diagnosis not present

## 2021-04-28 DIAGNOSIS — H401113 Primary open-angle glaucoma, right eye, severe stage: Secondary | ICD-10-CM | POA: Diagnosis not present

## 2021-05-04 DIAGNOSIS — Z96652 Presence of left artificial knee joint: Secondary | ICD-10-CM | POA: Diagnosis not present

## 2021-05-04 DIAGNOSIS — M25562 Pain in left knee: Secondary | ICD-10-CM | POA: Diagnosis not present

## 2021-05-08 DIAGNOSIS — M25562 Pain in left knee: Secondary | ICD-10-CM | POA: Diagnosis not present

## 2021-05-08 DIAGNOSIS — Z96652 Presence of left artificial knee joint: Secondary | ICD-10-CM | POA: Diagnosis not present

## 2021-05-11 DIAGNOSIS — M25562 Pain in left knee: Secondary | ICD-10-CM | POA: Diagnosis not present

## 2021-05-11 DIAGNOSIS — Z96652 Presence of left artificial knee joint: Secondary | ICD-10-CM | POA: Diagnosis not present

## 2021-05-13 DIAGNOSIS — M25562 Pain in left knee: Secondary | ICD-10-CM | POA: Diagnosis not present

## 2021-05-13 DIAGNOSIS — Z96652 Presence of left artificial knee joint: Secondary | ICD-10-CM | POA: Diagnosis not present

## 2021-05-18 ENCOUNTER — Other Ambulatory Visit: Payer: Self-pay | Admitting: Family Medicine

## 2021-05-18 DIAGNOSIS — R600 Localized edema: Secondary | ICD-10-CM

## 2021-05-18 DIAGNOSIS — I10 Essential (primary) hypertension: Secondary | ICD-10-CM

## 2021-05-21 DIAGNOSIS — M25562 Pain in left knee: Secondary | ICD-10-CM | POA: Diagnosis not present

## 2021-05-21 DIAGNOSIS — Z96652 Presence of left artificial knee joint: Secondary | ICD-10-CM | POA: Diagnosis not present

## 2021-06-19 DIAGNOSIS — Z96652 Presence of left artificial knee joint: Secondary | ICD-10-CM | POA: Diagnosis not present

## 2021-07-03 DIAGNOSIS — H401133 Primary open-angle glaucoma, bilateral, severe stage: Secondary | ICD-10-CM | POA: Diagnosis not present

## 2021-07-06 ENCOUNTER — Telehealth: Payer: Self-pay

## 2021-07-06 ENCOUNTER — Other Ambulatory Visit: Payer: Self-pay

## 2021-07-06 ENCOUNTER — Encounter: Payer: Self-pay | Admitting: Family Medicine

## 2021-07-06 ENCOUNTER — Ambulatory Visit (INDEPENDENT_AMBULATORY_CARE_PROVIDER_SITE_OTHER): Payer: Medicare HMO | Admitting: Family Medicine

## 2021-07-06 VITALS — BP 121/71 | HR 68 | Wt 213.0 lb

## 2021-07-06 DIAGNOSIS — G629 Polyneuropathy, unspecified: Secondary | ICD-10-CM

## 2021-07-06 DIAGNOSIS — E7800 Pure hypercholesterolemia, unspecified: Secondary | ICD-10-CM

## 2021-07-06 DIAGNOSIS — I10 Essential (primary) hypertension: Secondary | ICD-10-CM

## 2021-07-06 DIAGNOSIS — Z125 Encounter for screening for malignant neoplasm of prostate: Secondary | ICD-10-CM | POA: Diagnosis not present

## 2021-07-06 DIAGNOSIS — E039 Hypothyroidism, unspecified: Secondary | ICD-10-CM

## 2021-07-06 MED ORDER — ASPIRIN 81 MG PO TBEC: 81.0000 mg | DELAYED_RELEASE_TABLET | Freq: Every day | ORAL | Status: DC

## 2021-07-06 MED ORDER — GABAPENTIN 300 MG PO CAPS: 300.0000 mg | ORAL_CAPSULE | Freq: Two times a day (BID) | ORAL | 1 refills | Status: DC

## 2021-07-06 MED ORDER — TRAMADOL HCL 50 MG PO TABS: 50.0000 mg | ORAL_TABLET | ORAL | 1 refills | Status: DC | PRN

## 2021-07-06 NOTE — Telephone Encounter (Signed)
Copied from [facility] [id number]. Topic: General - Other >> Jul 06, 2021  2:33 PM Leward Quan A wrote: Reason for CRM: A rep with [facility] called in to inquire of Dr Caryn Section about Rx that was sent in for traMADol (ULTRAM) 50 MG tablet need to know if it is being used for pain management or just an acute situation. If acute patient can only get a weeks worth but if for pain management please call and advise

## 2021-07-06 NOTE — Telephone Encounter (Signed)
[place] advised.  Thanks,   -Mickel Baas

## 2021-07-06 NOTE — Progress Notes (Signed)
Established patient visit   Patient: Daniel Carr   DOB: December 07, 1935   86 y.o. Male  MRN: [id number] Visit Date: 07/06/2021  Today's healthcare provider: Lelon Huh, MD   No chief complaint on file.  Subjective    HPI  Follow up for  Acute pulmonary embolism  The patient was last seen for this 3 months ago. Changes made at last visit include continue with eliquis, but pt is only taking one everyday so he can be on it longer.   He reports excellent compliance with treatment. He feels that condition is Improved. He is not having side effects.   -----------------------------------------------------------------------------------------      Medications: Outpatient Medications Prior to Visit  Medication Sig   amLODipine (NORVASC) 2.5 MG tablet Take 1 tablet (2.5 mg total) by mouth daily.   apixaban (ELIQUIS) 5 MG TABS tablet Take 1 tablet (5 mg total) by mouth 2 (two) times daily.   azelastine (ASTELIN) 0.1 % nasal spray Place 1 spray into the nose daily as needed (allergies).   bimatoprost (LUMIGAN) 0.01 % SOLN Place 1 drop into both eyes at bedtime.   brimonidine (ALPHAGAN) 0.2 % ophthalmic solution Place 1 drop into both eyes in the morning and at bedtime.   COD LIVER OIL PO Take 1 capsule by mouth daily.   dutasteride (AVODART) 0.5 MG capsule Take 1 capsule (0.5 mg total) by mouth daily.   hydrochlorothiazide (HYDRODIURIL) 25 MG tablet Take 0.5 tablets (12.5 mg total) by mouth daily.   ipratropium (ATROVENT) 0.06 % nasal spray Place 1 spray into both nostrils 2 (two) times daily as needed for rhinitis.   levocetirizine (XYZAL) 5 MG tablet Take 5 mg by mouth every evening.   lovastatin (MEVACOR) 40 MG tablet Take 1 tablet (40 mg total) by mouth daily.   Multiple Vitamin (DAILY VITAMIN PO) Take 1 tablet by mouth daily.   SYNTHROID 125 MCG tablet Take 1 tablet (125 mcg total) by mouth daily. (Patient taking differently: Take 125 mcg by mouth daily before breakfast.)    traMADol (ULTRAM) 50 MG tablet Take 1 tablet (50 mg total) by mouth every 4 (four) hours as needed for moderate pain.   Travoprost, BAK Free, (TRAVATAN) 0.004 % SOLN ophthalmic solution Apply 1 drop to eye at bedtime.   No facility-administered medications prior to visit.    Review of Systems  Constitutional: Negative.   Respiratory:  Positive for shortness of breath. Negative for apnea, cough, choking, chest tightness, wheezing and stridor.   Cardiovascular:  Positive for leg swelling. Negative for chest pain and palpitations.  Gastrointestinal:  Positive for constipation. Negative for abdominal distention, abdominal pain, anal bleeding, blood in stool, diarrhea, nausea, rectal pain and vomiting.  Neurological:  Negative for dizziness, light-headedness and headaches.  Hematological:  Negative for adenopathy. Does not bruise/bleed easily.      Objective    BP 121/71 (BP Location: Right Arm, Patient Position: Sitting, Cuff Size: Large)   Pulse 68   Wt 213 lb (96.6 kg)   SpO2 100%   BMI 28.10 kg/m    Physical Exam   General: Appearance:     Well developed, well nourished male in no acute distress  Eyes:    PERRL, conjunctiva/corneas clear, EOM's intact       Lungs:     Clear to auscultation bilaterally, respirations unlabored  Heart:    Normal heart rate. Normal rhythm.  2/6 systolic murmur at right upper sternal border  MS:   All extremities  are intact.   Neurologic:   Awake, alert, oriented x 3. No apparent focal neurological           defect.         Assessment & Plan     1. Hypertension, benign Well controlled.  Continue current medications.    2. Neuropathy Try - gabapentin (NEURONTIN) 300 MG capsule; Take 1 capsule (300 mg total) by mouth 2 (two) times daily.  Dispense: 60 capsule; Refill: 1  refill traMADol (ULTRAM) 50 MG tablet; Take 1 tablet (50 mg total) by mouth every 4 (four) hours as needed for moderate pain.  Dispense: 30 tablet; Refill: 1  3. Prostate  cancer screening  - PSA Total (Reflex To Free)  4. Pure hypercholesterolemia He is tolerating lovastatin well with no adverse effects.    - CBC - Lipid panel  5. Hypothyroidism, unspecified type  - TSH  Has completed 6 months NOAC for DVT associated with orthopedic surgery . He is to stop Eliquis and start back on - aspirin 81 MG EC tablet; Take 1 tablet (81 mg total) by mouth daily. Swallow whole.         The entirety of the information documented in the History of Present Illness, Review of Systems and Physical Exam were personally obtained by me. Portions of this information were initially documented by the CMA and reviewed by me for thoroughness and accuracy.     Lelon Huh, MD  [facility] [phone] (phone) [phone] (fax)  [place]

## 2021-07-06 NOTE — Telephone Encounter (Signed)
It's for chronic pain

## 2021-07-07 LAB — CBC
Hematocrit: 36.3 % — ABNORMAL LOW (ref 37.5–51.0)
Hemoglobin: 12.4 g/dL — ABNORMAL LOW (ref 13.0–17.7)
MCH: 30.8 pg (ref 26.6–33.0)
MCHC: 34.2 g/dL (ref 31.5–35.7)
MCV: 90 fL (ref 79–97)
Platelets: 209 10*3/uL (ref 150–450)
RBC: 4.03 x10E6/uL — ABNORMAL LOW (ref 4.14–5.80)
RDW: 13.2 % (ref 11.6–15.4)
WBC: 6.1 10*3/uL (ref 3.4–10.8)

## 2021-07-07 LAB — PSA TOTAL (REFLEX TO FREE): Prostate Specific Ag, Serum: 3.3 ng/mL (ref 0.0–4.0)

## 2021-07-07 LAB — LIPID PANEL
Chol/HDL Ratio: 2.1 ratio (ref 0.0–5.0)
Cholesterol, Total: 162 mg/dL (ref 100–199)
HDL: 76 mg/dL (ref >39)
LDL Chol Calc (NIH): 74 mg/dL (ref 0–99)
Triglycerides: 61 mg/dL (ref 0–149)
VLDL Cholesterol Cal: 12 mg/dL (ref 5–40)

## 2021-07-07 LAB — TSH: TSH: 6.18 u[IU]/mL — ABNORMAL HIGH (ref 0.450–4.500)

## 2021-08-06 ENCOUNTER — Ambulatory Visit: Payer: Self-pay | Admitting: *Deleted

## 2021-08-06 ENCOUNTER — Telehealth: Payer: Self-pay

## 2021-08-06 ENCOUNTER — Other Ambulatory Visit: Payer: Self-pay

## 2021-08-06 DIAGNOSIS — Z03818 Encounter for observation for suspected exposure to other biological agents ruled out: Secondary | ICD-10-CM | POA: Diagnosis not present

## 2021-08-06 DIAGNOSIS — Z20822 Contact with and (suspected) exposure to covid-19: Secondary | ICD-10-CM | POA: Diagnosis not present

## 2021-08-06 MED ORDER — PAXLOVID 20 X 150 MG & 10 X 100MG PO TBPK
ORAL_TABLET | ORAL | 0 refills | Status: DC
Start: 2021-08-06 — End: 2021-12-30
  Filled 2021-08-06: qty 30, 5d supply, fill #0

## 2021-08-06 NOTE — Telephone Encounter (Signed)
Outpatient Pharmacy Oral COVID Treatment Note  I connected with Daniel Carr on 08/06/2021/4:17 PM by telephone and verified that I am speaking with the correct person using two identifiers.  I discussed the limitations, risks, security, and privacy concerns of performing an evaluation and management service by telephone and the availability of in person appointments via referral to a physician. The patient expressed understanding and agreed to proceed.  Pharmacy location: [place]  Diagnosis: COVID-19 infection  Purpose of visit: Discussion of potential use of Paxlovid, a new treatment for mild to moderate COVID-19 viral infection in non-hospitalized patients.  Subjective/Objective: Patient is a 86 y.o. male who is presenting with COVID 19 viral infection.  COVID 19 viral infection. Their symptoms began on 08/04/21 with cold symptoms.  The patient has confirmed COVID-19 via a PCR test on 08/06/21.   Past Medical History:  Diagnosis Date   Arthritis    childhood infection    chicken pox, measels, and mumps   Hypertension    Hypothyroidism    Sinus problem    Ulnar neuropathy    bilateral    No Known Allergies   Current Outpatient Medications:    amLODipine (NORVASC) 2.5 MG tablet, Take 1 tablet (2.5 mg total) by mouth daily., Disp: 90 tablet, Rfl: 4   aspirin 81 MG EC tablet, Take 1 tablet (81 mg total) by mouth daily. Swallow whole., Disp: , Rfl:    azelastine (ASTELIN) 0.1 % nasal spray, Place 1 spray into the nose daily as needed (allergies)., Disp: , Rfl:    bimatoprost (LUMIGAN) 0.01 % SOLN, Place 1 drop into both eyes at bedtime., Disp: , Rfl:    brimonidine (ALPHAGAN) 0.2 % ophthalmic solution, Place 1 drop into both eyes in the morning and at bedtime., Disp: , Rfl:    COD LIVER OIL PO, Take 1 capsule by mouth daily., Disp: , Rfl:    dutasteride (AVODART) 0.5 MG capsule, Take 1 capsule (0.5 mg total) by mouth daily., Disp: 90 capsule, Rfl: 3    gabapentin (NEURONTIN) 300 MG capsule, Take 1 capsule (300 mg total) by mouth 2 (two) times daily., Disp: 60 capsule, Rfl: 1   hydrochlorothiazide (HYDRODIURIL) 25 MG tablet, Take 0.5 tablets (12.5 mg total) by mouth daily., Disp: 45 tablet, Rfl: 4   ipratropium (ATROVENT) 0.06 % nasal spray, Place 1 spray into both nostrils 2 (two) times daily as needed for rhinitis., Disp: , Rfl: 6   levocetirizine (XYZAL) 5 MG tablet, Take 5 mg by mouth every evening., Disp: , Rfl:    lovastatin (MEVACOR) 40 MG tablet, Take 1 tablet (40 mg total) by mouth daily., Disp: 90 tablet, Rfl: 3   Multiple Vitamin (DAILY VITAMIN PO), Take 1 tablet by mouth daily., Disp: , Rfl:    Nirmatrelvir & Ritonavir (PAXLOVID) 20 x 150 MG & 10 x 100MG TBPK, Take 2 nirmatrelvir (150 mg) tablets and take 1 ritonavir (100 mg) tablet (3 tablets total) by mouth two times daily for 5 days, Disp: 30 tablet, Rfl: 0   SYNTHROID 125 MCG tablet, Take 1 tablet (125 mcg total) by mouth daily. (Patient taking differently: Take 125 mcg by mouth daily before breakfast.), Disp: 90 tablet, Rfl: 4   traMADol (ULTRAM) 50 MG tablet, Take 1 tablet (50 mg total) by mouth every 4 (four) hours as needed for moderate pain., Disp: 30 tablet, Rfl: 1   Travoprost, BAK Free, (TRAVATAN) 0.004 % SOLN ophthalmic solution, Apply 1 drop to eye at bedtime., Disp: , Rfl:  Lab Monitoring: eGFR 86 (02/05/21)  Drug Interactions Noted: Patient was instructed to cut amlodipine dose in half and monitor blood pressure. Patient was also instructed to hold lovastatin. Patient's last lovastatin dose was 8:00am on 08/06/21. Patient will not start Paxlovid until morning of 08/07/21. He will hold lovastatin while taking Paxlovid and for 5 days after completion of therapy.  Plan:  This patient is a 86 y.o. male that meets the criteria for Emergency Use Authorization of Paxlovid. After reviewing the emergency use authorization with the patient, the patient agrees to receive  Paxlovid.  Through FDA guidance and current Max standing order Paxlovid will be prescribed to the patient.   Patient contacted for counseling on 08/06/21 and verbalized understanding.   Delivery or Pick-Up Date: Pick-up on 08/06/21  Follow up instructions:    Take prescription BID x 5 days as directed Counseling was provided by pharmacist. Reach out to pharmacist with follow up questions For concerns regarding further COVID symptoms please follow up with your PCP or urgent care For urgent or life-threatening issues, seek care at your local emergency department   Carolynne Edouard 08/06/2021, 4:17 PM [facility] Pharmacist Phone# [phone]

## 2021-08-06 NOTE — Telephone Encounter (Signed)
Patient Covid positive today by outside source.  Cough HA Loss of taste/smell. Patient reports he has chronic SOB from other condition. Symptoms began Tuesday. He received 2 vaccines and one booster in 2021. He is requesting Paxlovid due to his co-morbidities. Pharmacy on file. Routing to pcp for consideration  Reviewed isolation period, daily water intake and OTC tylenol/advil for discomfort.  Ensure disinfecting occurring daily with all common household areas. Monitor your breathing for any changes like wheezing/SOB/chest tightness-if these should occur seek treatment at the nearest ED.

## 2021-08-07 ENCOUNTER — Telehealth: Payer: Self-pay

## 2021-08-07 NOTE — Telephone Encounter (Signed)
Patient's daughter Daniel Carr states that patient tested positive for COVID yesterday at [facility]. He was prescribed paxlovid and took the first dose this morning. Daniel Carr states that patient has a fever of 101 this morning. He also has shortness of breath and is fatigued. She wants to know if he can be seen. She is concerned because of his history of blood clots and now he is having shortness of breath. I advised Daniel Carr that she should take him to the ER. Daniel Carr states that Daniel Carr doesn't really want to go to the ER. Daniel Carr would like Daniel Carr recommendation. Please advise. Patient's symptoms started 2 days ago.

## 2021-08-07 NOTE — Telephone Encounter (Signed)
If he is short of breath he needs to go to ER for evaluation. We can't do all the tests he would need for evaluation. He could have covid pneumonia or blood count and we can't evaluate for those in the office.

## 2021-09-10 ENCOUNTER — Other Ambulatory Visit: Payer: Self-pay

## 2021-09-10 DIAGNOSIS — D472 Monoclonal gammopathy: Secondary | ICD-10-CM

## 2021-09-11 ENCOUNTER — Inpatient Hospital Stay: Payer: Medicare HMO | Attending: Oncology

## 2021-09-11 DIAGNOSIS — D472 Monoclonal gammopathy: Secondary | ICD-10-CM | POA: Diagnosis not present

## 2021-09-11 LAB — CBC WITH DIFFERENTIAL/PLATELET
Abs Immature Granulocytes: 0 10*3/uL (ref 0.00–0.07)
Basophils Absolute: 0 10*3/uL (ref 0.0–0.1)
Basophils Relative: 1 %
Eosinophils Absolute: 0.1 10*3/uL (ref 0.0–0.5)
Eosinophils Relative: 1 %
HCT: 38.8 % — ABNORMAL LOW (ref 39.0–52.0)
Hemoglobin: 12.7 g/dL — ABNORMAL LOW (ref 13.0–17.0)
Immature Granulocytes: 0 %
Lymphocytes Relative: 35 %
Lymphs Abs: 1.8 10*3/uL (ref 0.7–4.0)
MCH: 30.6 pg (ref 26.0–34.0)
MCHC: 32.7 g/dL (ref 30.0–36.0)
MCV: 93.5 fL (ref 80.0–100.0)
Monocytes Absolute: 0.7 10*3/uL (ref 0.1–1.0)
Monocytes Relative: 13 %
Neutro Abs: 2.6 10*3/uL (ref 1.7–7.7)
Neutrophils Relative %: 50 %
Platelets: 130 10*3/uL — ABNORMAL LOW (ref 150–400)
RBC: 4.15 MIL/uL — ABNORMAL LOW (ref 4.22–5.81)
RDW: 13.5 % (ref 11.5–15.5)
WBC: 5.2 10*3/uL (ref 4.0–10.5)
nRBC: 0 % (ref 0.0–0.2)

## 2021-09-11 LAB — BASIC METABOLIC PANEL
Anion gap: 6 (ref 5–15)
BUN: 15 mg/dL (ref 8–23)
CO2: 33 mmol/L — ABNORMAL HIGH (ref 22–32)
Calcium: 9.2 mg/dL (ref 8.9–10.3)
Chloride: 91 mmol/L — ABNORMAL LOW (ref 98–111)
Creatinine, Ser: 1.02 mg/dL (ref 0.61–1.24)
GFR, Estimated: >60 mL/min (ref >60)
Glucose, Bld: 83 mg/dL (ref 70–99)
Potassium: 3.8 mmol/L (ref 3.5–5.1)
Sodium: 130 mmol/L — ABNORMAL LOW (ref 135–145)

## 2021-09-12 LAB — IGG, IGA, IGM
IgA: 156 mg/dL (ref 61–437)
IgG (Immunoglobin G), Serum: 1579 mg/dL (ref 603–1613)
IgM (Immunoglobulin M), Srm: 68 mg/dL (ref 15–143)

## 2021-09-14 LAB — KAPPA/LAMBDA LIGHT CHAINS
Kappa free light chain: 23.9 mg/L — ABNORMAL HIGH (ref 3.3–19.4)
Kappa, lambda light chain ratio: 1.84 — ABNORMAL HIGH (ref 0.26–1.65)
Lambda free light chains: 13 mg/L (ref 5.7–26.3)

## 2021-09-14 LAB — PROTEIN ELECTROPHORESIS, SERUM
A/G Ratio: 1.3 (ref 0.7–1.7)
Albumin ELP: 3.7 g/dL (ref 2.9–4.4)
Alpha-1-Globulin: 0.2 g/dL (ref 0.0–0.4)
Alpha-2-Globulin: 0.4 g/dL (ref 0.4–1.0)
Beta Globulin: 0.7 g/dL (ref 0.7–1.3)
Gamma Globulin: 1.6 g/dL (ref 0.4–1.8)
Globulin, Total: 2.9 g/dL (ref 2.2–3.9)
M-Spike, %: 1.1 g/dL — ABNORMAL HIGH
Total Protein ELP: 6.6 g/dL (ref 6.0–8.5)

## 2021-09-18 ENCOUNTER — Inpatient Hospital Stay: Payer: Medicare HMO | Admitting: Oncology

## 2021-09-21 ENCOUNTER — Inpatient Hospital Stay (HOSPITAL_BASED_OUTPATIENT_CLINIC_OR_DEPARTMENT_OTHER): Payer: Medicare HMO | Admitting: Oncology

## 2021-09-21 DIAGNOSIS — D472 Monoclonal gammopathy: Secondary | ICD-10-CM | POA: Diagnosis not present

## 2021-09-21 NOTE — Progress Notes (Signed)
Pt has no concerns/complaints at this time. 

## 2021-09-21 NOTE — Progress Notes (Signed)
[place]  [phone] [phone]  ID: Daniel Carr OB: 10/17/1935  MR#: [id number]  FKC#:[id number]  Patient Care Team: Daniel Sons, MD as PCP - General (Family Medicine) Daniel Cotta, MD as Consulting Physician (Ophthalmology)  CHIEF COMPLAINT: MGUS  I connected with Daniel Carr on 09/21/21 at  1:15 PM EDT by telephone visit and verified that I am speaking with the correct person using two identifiers.   I discussed the limitations, risks, security and privacy concerns of performing an evaluation and management service by telemedicine and the availability of in-person appointments. I also discussed with the patient that there may be a patient responsible charge related to this service. The patient expressed understanding and agreed to proceed.   Other persons participating in the visit and their role in the encounter: Patient, NP    Patient's location: Home  Provider's location: Clinic    INTERVAL HISTORY: Daniel Carr is an 86 year old male with past medical history significant for hypertension, arthritis, osteoarthritis, BPH, hypothyroidism, systolic murmur who is followed by Daniel Carr for MGUS.  He is seen annually for lab work.  Since his last visit, he reports doing well.  Denies any recent hospitalizations.  He is followed closely by Daniel Carr his PCP.  REVIEW OF SYSTEMS:   Review of Systems  Constitutional: Negative.  Negative for chills, fever, malaise/fatigue and weight loss.  HENT:  Negative for congestion, ear pain and tinnitus.   Eyes: Negative.  Negative for blurred vision and double vision.  Respiratory: Negative.  Negative for cough, sputum production and shortness of breath.   Cardiovascular: Negative.  Negative for chest pain, palpitations and leg swelling.  Gastrointestinal: Negative.  Negative for abdominal pain, constipation, diarrhea, nausea and vomiting.  Genitourinary:  Negative for dysuria, frequency  and urgency.  Musculoskeletal:  Negative for back pain and falls.  Skin: Negative.  Negative for rash.  Neurological: Negative.  Negative for weakness and headaches.  Endo/Heme/Allergies: Negative.  Does not bruise/bleed easily.  Psychiatric/Behavioral: Negative.  Negative for depression. The patient is not nervous/anxious and does not have insomnia.    As per HPI. Otherwise, a complete review of systems is negative.  PAST MEDICAL HISTORY: Past Medical History:  Diagnosis Date   Arthritis    childhood infection    chicken pox, measels, and mumps   Hypertension    Hypothyroidism    Sinus problem    Ulnar neuropathy    bilateral    PAST SURGICAL HISTORY: Past Surgical History:  Procedure Laterality Date   APPENDECTOMY     CATARACT EXTRACTION Bilateral    COLONOSCOPY     JOINT REPLACEMENT Right    KNEE ARTHROPLASTY Left 12/24/2020   Procedure: COMPUTER ASSISTED TOTAL KNEE ARTHROPLASTY;  Surgeon: Daniel Leep, MD;  Location: [facility] ORS;  Service: Orthopedics;  Laterality: Left;  3RD CASE   KNEE SURGERY Right 10/04/2011   replacement   NM MYOVIEW ([facility] HX)  07/21/2017   [place]. No ischemia.    RETINAL DETACHMENT REPAIR W/ SCLERAL BUCKLE LE     TONSILLECTOMY      FAMILY HISTORY: Family History  Problem Relation Age of Onset   Stroke Sister    Hypertension Mother    Stroke Father    Hypertension Father     ADVANCED DIRECTIVES (Y/N):  N  HEALTH MAINTENANCE: Social History   Tobacco Use   Smoking status: Former    Packs/day: 0.25    Years: 6.00    Pack years: 1.50  Types: Cigarettes    Quit date: 11/13/1969    Years since quitting: 51.8   Smokeless tobacco: Never   Tobacco comments:    quit in 1970's  Vaping Use   Vaping Use: Never used  Substance Use Topics   Alcohol use: No    Alcohol/week: 0.0 standard drinks   Drug use: No     Colonoscopy:  PAP:  Bone density:  Lipid panel:  No Known Allergies  Current Outpatient Medications  Medication Sig  Dispense Refill   amLODipine (NORVASC) 2.5 MG tablet Take 1 tablet (2.5 mg total) by mouth daily. 90 tablet 4   aspirin 81 MG EC tablet Take 1 tablet (81 mg total) by mouth daily. Swallow whole.     azelastine (ASTELIN) 0.1 % nasal spray Place 1 spray into the nose daily as needed (allergies).     bimatoprost (LUMIGAN) 0.01 % SOLN Place 1 drop into both eyes at bedtime.     brimonidine (ALPHAGAN) 0.2 % ophthalmic solution Place 1 drop into both eyes in the morning and at bedtime.     COD LIVER OIL PO Take 1 capsule by mouth daily.     dutasteride (AVODART) 0.5 MG capsule Take 1 capsule (0.5 mg total) by mouth daily. 90 capsule 3   gabapentin (NEURONTIN) 300 MG capsule Take 1 capsule (300 mg total) by mouth 2 (two) times daily. 60 capsule 1   hydrochlorothiazide (HYDRODIURIL) 25 MG tablet Take 0.5 tablets (12.5 mg total) by mouth daily. 45 tablet 4   ipratropium (ATROVENT) 0.06 % nasal spray Place 1 spray into both nostrils 2 (two) times daily as needed for rhinitis.  6   levocetirizine (XYZAL) 5 MG tablet Take 5 mg by mouth every evening.     lovastatin (MEVACOR) 40 MG tablet Take 1 tablet (40 mg total) by mouth daily. 90 tablet 3   Multiple Vitamin (DAILY VITAMIN PO) Take 1 tablet by mouth daily.     Nirmatrelvir & Ritonavir (PAXLOVID) 20 x 150 MG & 10 x 100MG TBPK Take 2 nirmatrelvir (150 mg) tablets and take 1 ritonavir (100 mg) tablet (3 tablets total) by mouth two times daily for 5 days 30 tablet 0   SYNTHROID 125 MCG tablet Take 1 tablet (125 mcg total) by mouth daily. (Patient taking differently: Take 125 mcg by mouth daily before breakfast.) 90 tablet 4   traMADol (ULTRAM) 50 MG tablet Take 1 tablet (50 mg total) by mouth every 4 (four) hours as needed for moderate pain. 30 tablet 1   Travoprost, BAK Free, (TRAVATAN) 0.004 % SOLN ophthalmic solution Apply 1 drop to eye at bedtime.     No current facility-administered medications for this visit.    OBJECTIVE: There were no vitals filed  for this visit.    There is no height or weight on file to calculate BMI.    ECOG FS:0 - Asymptomatic  Physical Exam Pulmonary:     Effort: Pulmonary effort is normal.  Neurological:     Mental Status: He is alert and oriented to person, place, and time.     LAB RESULTS:  Lab Results  Component Value Date   NA 130 (L) 09/11/2021   K 3.8 09/11/2021   CL 91 (L) 09/11/2021   CO2 33 (H) 09/11/2021   GLUCOSE 83 09/11/2021   BUN 15 09/11/2021   CREATININE 1.02 09/11/2021   CALCIUM 9.2 09/11/2021   PROT 6.4 02/05/2021   ALBUMIN 3.7 02/05/2021   AST 18 02/05/2021   ALT 6  02/05/2021   ALKPHOS 45 02/05/2021   BILITOT 0.4 02/05/2021   GFRNONAA >60 09/11/2021   GFRAA 86 02/05/2021    Lab Results  Component Value Date   WBC 5.2 09/11/2021   NEUTROABS 2.6 09/11/2021   HGB 12.7 (L) 09/11/2021   HCT 38.8 (L) 09/11/2021   MCV 93.5 09/11/2021   PLT 130 (L) 09/11/2021   Lab Results  Component Value Date   TOTALPROTELP 6.6 09/11/2021   ALBUMINELP 3.7 09/11/2021   A1GS 0.2 09/11/2021   A2GS 0.4 09/11/2021   BETS 0.7 09/11/2021   GAMS 1.6 09/11/2021   MSPIKE 1.1 (H) 09/11/2021   SPEI Comment 09/11/2021     STUDIES: No results found.   ASSESSMENT: MGUS  PLAN:    1. MGUS:  Patient has been followed annually by Daniel Carr for several years.  Labs from 09/11/2021 show a hemoglobin of 12.7, M spike 1.1 and a kappa lambda light chain ratio of 1.84.  This is very stable from previous.  He does not meet crab criteria.  He denies any bone pain.  No evidence of endorgan damage.  He does not require a bone marrow biopsy or metastatic bone survey at this time.  He is low risk for progressing to multiple myeloma.  Continue annual follow-up with labs a few days before.  I provided 12 minutes of non face-to-face telephone visit time during this encounter, and > 50% was spent counseling as documented under my assessment & plan.   Patient expressed understanding and was in agreement  with this plan. He also understands that He can call clinic at any time with any questions, concerns, or complaints.    Jacquelin Hawking, NP   09/21/2021 1:37 PM

## 2021-10-07 ENCOUNTER — Other Ambulatory Visit: Payer: Self-pay | Admitting: Family Medicine

## 2021-10-07 DIAGNOSIS — E039 Hypothyroidism, unspecified: Secondary | ICD-10-CM

## 2021-10-07 NOTE — Telephone Encounter (Signed)
Requested medications are due for refill today.  yes  Requested medications are on the active medications list.  yes  Last refill. 08/25/2020  Future visit scheduled.   yes  Notes to clinic.  Abnormal labs.

## 2021-10-08 DIAGNOSIS — J3089 Other allergic rhinitis: Secondary | ICD-10-CM | POA: Diagnosis not present

## 2021-10-08 DIAGNOSIS — J301 Allergic rhinitis due to pollen: Secondary | ICD-10-CM | POA: Diagnosis not present

## 2021-10-12 DIAGNOSIS — R0600 Dyspnea, unspecified: Secondary | ICD-10-CM | POA: Diagnosis not present

## 2021-10-21 ENCOUNTER — Encounter: Payer: Self-pay | Admitting: Family Medicine

## 2021-10-21 ENCOUNTER — Ambulatory Visit (INDEPENDENT_AMBULATORY_CARE_PROVIDER_SITE_OTHER): Payer: Medicare HMO | Admitting: Family Medicine

## 2021-10-21 ENCOUNTER — Other Ambulatory Visit: Payer: Self-pay

## 2021-10-21 VITALS — BP 127/77 | HR 66 | Temp 98.4°F | Ht 73.0 in | Wt 209.0 lb

## 2021-10-21 DIAGNOSIS — E7800 Pure hypercholesterolemia, unspecified: Secondary | ICD-10-CM | POA: Diagnosis not present

## 2021-10-21 DIAGNOSIS — Z23 Encounter for immunization: Secondary | ICD-10-CM | POA: Diagnosis not present

## 2021-10-21 DIAGNOSIS — R0609 Other forms of dyspnea: Secondary | ICD-10-CM | POA: Diagnosis not present

## 2021-10-21 DIAGNOSIS — R21 Rash and other nonspecific skin eruption: Secondary | ICD-10-CM

## 2021-10-21 DIAGNOSIS — I10 Essential (primary) hypertension: Secondary | ICD-10-CM | POA: Diagnosis not present

## 2021-10-21 DIAGNOSIS — Z0000 Encounter for general adult medical examination without abnormal findings: Secondary | ICD-10-CM

## 2021-10-21 DIAGNOSIS — E039 Hypothyroidism, unspecified: Secondary | ICD-10-CM | POA: Diagnosis not present

## 2021-10-21 DIAGNOSIS — R609 Edema, unspecified: Secondary | ICD-10-CM | POA: Diagnosis not present

## 2021-10-21 MED ORDER — CLOTRIMAZOLE-BETAMETHASONE 1-0.05 % EX CREA: 1.0000 "application " | TOPICAL_CREAM | Freq: Every day | CUTANEOUS | 0 refills | Status: DC

## 2021-10-21 NOTE — Patient Instructions (Signed)
Please review the attached list of medications and notify my office if there are any errors.   You can try very small amount of over the counter 0.5% hydrocortisone cream on the outside of your upper eyelid once a day

## 2021-10-21 NOTE — Progress Notes (Signed)
Complete Physical Exam     Patient: Daniel Carr, Male    DOB: [birth year], 86 y.o.   MRN: [id number] Visit Date: 10/21/2021  Today's Provider: Lelon Huh, MD   Chief Complaint  Patient presents with   Annual Exam   Rash   Subjective    Daniel Carr is a 86 y.o. male who presents today for his complete physical examination  He reports consuming a general diet. The patient does not participate in regular exercise at present. He generally feels well. He reports sleeping well. He does have additional problems to discuss today.   Rash  Has had itchy rash on the dorsum of his right forearm for a few years. Was prescribed triamcinolone in 2020 and states it didn't really help.    Medications: Outpatient Medications Prior to Visit  Medication Sig   amLODipine (NORVASC) 2.5 MG tablet Take 1 tablet (2.5 mg total) by mouth daily.   aspirin 81 MG EC tablet Take 1 tablet (81 mg total) by mouth daily. Swallow whole.   azelastine (ASTELIN) 0.1 % nasal spray Place 1 spray into the nose daily as needed (allergies).   bimatoprost (LUMIGAN) 0.01 % SOLN Place 1 drop into both eyes at bedtime.   brimonidine (ALPHAGAN) 0.2 % ophthalmic solution Place 1 drop into both eyes in the morning and at bedtime.   COD LIVER OIL PO Take 1 capsule by mouth daily.   dutasteride (AVODART) 0.5 MG capsule Take 1 capsule (0.5 mg total) by mouth daily.   hydrochlorothiazide (HYDRODIURIL) 25 MG tablet Take 0.5 tablets (12.5 mg total) by mouth daily.   ipratropium (ATROVENT) 0.06 % nasal spray Place 1 spray into both nostrils 2 (two) times daily as needed for rhinitis.   levocetirizine (XYZAL) 5 MG tablet Take 5 mg by mouth every evening.   lovastatin (MEVACOR) 40 MG tablet Take 1 tablet (40 mg total) by mouth daily.   Multiple Vitamin (DAILY VITAMIN PO) Take 1 tablet by mouth daily.   Nirmatrelvir & Ritonavir (PAXLOVID) 20 x 150 MG & 10 x 100MG  TBPK Take 2 nirmatrelvir (150 mg) tablets and take 1 ritonavir  (100 mg) tablet (3 tablets total) by mouth two times daily for 5 days   SYNTHROID 125 MCG tablet Take 1 tablet by mouth once daily   traMADol (ULTRAM) 50 MG tablet Take 1 tablet (50 mg total) by mouth every 4 (four) hours as needed for moderate pain.   Travoprost, BAK Free, (TRAVATAN) 0.004 % SOLN ophthalmic solution Apply 1 drop to eye at bedtime.   gabapentin (NEURONTIN) 300 MG capsule Take 1 capsule (300 mg total) by mouth 2 (two) times daily.   No facility-administered medications prior to visit.    No Known Allergies  Patient Care Team: [name], MD as PCP - General (Family Medicine) Dingeldein, Remo Lipps, MD as Consulting Physician (Ophthalmology)  Review of Systems  Constitutional: Negative.   HENT: Negative.    Eyes: Negative.   Respiratory: Negative.    Cardiovascular:  Positive for leg swelling. Negative for chest pain and palpitations.  Gastrointestinal: Negative.   Endocrine: Negative.   Genitourinary: Negative.   Musculoskeletal: Negative.   Skin:  Positive for rash. Negative for color change, pallor and wound.  Allergic/Immunologic: Negative.   Neurological: Negative.   Hematological: Negative.   Psychiatric/Behavioral: Negative.         Objective    Vitals: BP 127/77 (BP Location: Right Arm, Patient Position: Sitting, Cuff Size: Large)   Pulse 66  Temp 98.4 F (36.9 C) (Oral)   Ht 6\' 1"  (1.854 m)   Wt 209 lb (94.8 kg)   SpO2 100%   BMI 27.57 kg/m    Physical Exam  General Appearance:     Well developed, well nourished male. Alert, cooperative, in no acute distress, appears stated age  Head:    Normocephalic, without obvious abnormality, atraumatic  Eyes:    PERRL, conjunctiva/corneas clear, EOM's intact, fundi    benign, both eyes       Ears:    Normal TM's and external ear canals, both ears  Nose:   Nares normal, septum midline, mucosa normal, no drainage   or sinus tenderness  Back:     Symmetric, no curvature, ROM normal, no CVA  tenderness  Lungs:     Clear to auscultation bilaterally, respirations unlabored  Chest wall:    No tenderness or deformity  Heart:    Normal heart rate. Normal rhythm.  2/6 systolic murmur S1 and S2 normal  Abdomen:     Soft, non-tender, bowel sounds active all four quadrants,    no masses, no organomegaly  Genitalia:    deferred  Rectal:    deferred  Extremities:   All extremities are intact. No cyanosis. 2+ bilateral pitting ankle and distal leg edema.   Pulses:   2+ and symmetric all extremities  Skin:   Skin color, texture, turgor normal. About 3cm x 1.5cm hyperpigmented patch dorsum of right forearm as per HPI  Lymph nodes:   Cervical, supraclavicular, and axillary nodes normal  Neurologic:   CNII-XII intact. Normal strength, sensation and reflexes      throughout       Assessment & Plan       1. Hypothyroidism, unspecified type  - TSH  2. Rash Failed steroid cream, try clotrimazole-betamethasone (LOTRISONE) cream; Apply 1 application topically daily.  Dispense: 15 g; Refill: 0  Call for dermatology referral if not cleared with above.  3. Hypertension, benign Well controlled.  Continue current medications.   - Renal function panel - EKG 12-Lead  4. Pure hypercholesterolemia He is tolerating lovastatin well with no adverse effects.    5. Edema, unspecified type   6. Dyspnea on exertion  - Brain natriuretic peptide  7. Need for influenza vaccination  - Flu Vaccine QUAD High Dose(Fluad)       The entirety of the information documented in the History of Present Illness, Review of Systems and Physical Exam were personally obtained by me. Portions of this information were initially documented by the CMA and reviewed by me for thoroughness and accuracy.     Lelon Huh, MD  [facility] [phone] (phone) [phone] (fax)  [place]

## 2021-10-21 NOTE — Progress Notes (Signed)
Annual Wellness Visit     Patient: Daniel Carr, Male    DOB: 08/06/1935, 86 y.o.   MRN: [id number] Visit Date: 10/21/2021  Today's Provider: Lelon Huh, MD   Chief Complaint  Patient presents with   Annual Exam   Rash   Hypertension   Subjective    Daniel Carr is a 86 y.o. male who presents today for his Annual Wellness Visit.   Medications: Outpatient Medications Prior to Visit  Medication Sig   amLODipine (NORVASC) 2.5 MG tablet Take 1 tablet (2.5 mg total) by mouth daily.   aspirin 81 MG EC tablet Take 1 tablet (81 mg total) by mouth daily. Swallow whole.   azelastine (ASTELIN) 0.1 % nasal spray Place 1 spray into the nose daily as needed (allergies).   bimatoprost (LUMIGAN) 0.01 % SOLN Place 1 drop into both eyes at bedtime.   brimonidine (ALPHAGAN) 0.2 % ophthalmic solution Place 1 drop into both eyes in the morning and at bedtime.   COD LIVER OIL PO Take 1 capsule by mouth daily.   dutasteride (AVODART) 0.5 MG capsule Take 1 capsule (0.5 mg total) by mouth daily.   hydrochlorothiazide (HYDRODIURIL) 25 MG tablet Take 0.5 tablets (12.5 mg total) by mouth daily.   ipratropium (ATROVENT) 0.06 % nasal spray Place 1 spray into both nostrils 2 (two) times daily as needed for rhinitis.   levocetirizine (XYZAL) 5 MG tablet Take 5 mg by mouth every evening.   lovastatin (MEVACOR) 40 MG tablet Take 1 tablet (40 mg total) by mouth daily.   Multiple Vitamin (DAILY VITAMIN PO) Take 1 tablet by mouth daily.   Nirmatrelvir & Ritonavir (PAXLOVID) 20 x 150 MG & 10 x 100MG  TBPK Take 2 nirmatrelvir (150 mg) tablets and take 1 ritonavir (100 mg) tablet (3 tablets total) by mouth two times daily for 5 days   SYNTHROID 125 MCG tablet Take 1 tablet by mouth once daily   traMADol (ULTRAM) 50 MG tablet Take 1 tablet (50 mg total) by mouth every 4 (four) hours as needed for moderate pain.   Travoprost, BAK Free, (TRAVATAN) 0.004 % SOLN ophthalmic solution Apply 1 drop to eye at bedtime.    [DISCONTINUED] gabapentin (NEURONTIN) 300 MG capsule Take 1 capsule (300 mg total) by mouth 2 (two) times daily.   No facility-administered medications prior to visit.    No Known Allergies  Patient Care Team: [name], MD as PCP - General (Family Medicine) Dingeldein, Remo Lipps, MD as Consulting Physician (Ophthalmology)        Objective      Most recent functional status assessment: In your present state of health, do you have any difficulty performing the following activities: 10/21/2021  Hearing? N  Vision? N  Difficulty concentrating or making decisions? N  Walking or climbing stairs? Y  Comment -  Dressing or bathing? N  Doing errands, shopping? N  Some recent data might be hidden   Most recent fall risk assessment: Fall Risk  10/21/2021  Falls in the past year? 0  Number falls in past yr: 0  Injury with Fall? 0  Risk for fall due to : No Fall Risks  Follow up Falls evaluation completed    Most recent depression screenings: PHQ 2/9 Scores 10/21/2021 11/13/2020  PHQ - 2 Score 0 0  PHQ- 9 Score 1 -   Most recent cognitive screening: 6CIT Screen 10/21/2021  What Year? 0 points  What month? 0 points  What time? 0 points  Count  back from 20 0 points  Months in reverse 0 points  Repeat phrase 0 points  Total Score 0   Most recent Audit-C alcohol use screening Alcohol Use Disorder Test (AUDIT) 10/21/2021  1. How often do you have a drink containing alcohol? 0  2. How many drinks containing alcohol do you have on a typical day when you are drinking? 0  3. How often do you have six or more drinks on one occasion? 0  AUDIT-C Score 0  Alcohol Brief Interventions/Follow-up -   A score of 3 or more in women, and 4 or more in men indicates increased risk for alcohol abuse, EXCEPT if all of the points are from question 1   No results found for any visits on 10/21/21.  Assessment & Plan     Annual wellness visit done today including the all of the  following: Reviewed patient's Family Medical History Reviewed and updated list of patient's medical providers Assessment of cognitive impairment was done Assessed patient's functional ability Established a written schedule for health screening [place] Completed and Reviewed  Exercise Activities and Dietary recommendations  Goals      Chronic Disease Management     CARE PLAN ENTRY (see longitudinal plan of care for additional care plan information)  Current Barriers:  Chronic Disease Management support and education needs related to Hypertension, Chronic Bronchitis, Hypercholesterolemia and Left Knee Osteoarthritis.  Case Manager Clinical Goal(s):  Over the next 120 days, patient: Will not require hospitalization or emergent care d/t complications r/t chronic illnesses.  Will attend all scheduled medical appointments. Will take all medications as prescribed. Will monitor blood pressure and record readings. Will follow safety recommendations to prevents falls and injuries. Over the next 60 days, patient will: Follow up with [facility] regarding right foot. Update the chronic care management team with needs r/t pending knee surgery.   Interventions:  Inter-disciplinary care team collaboration (see longitudinal plan of care)  Reviewed current care management needs with caregiver/daughter [name]. [name] will start outpatient physical  therapy on 01/14/21 and will require transportation. Per Ortho team, the plan is to complete therapy 2-3 times/week. Referral placed for transportation assistance. We discussed possible in-home needs d/t [name] limited mobility. [name] confirmed that the family is able to support a private pay sitter. Anticipates the sitter being available to assist later this week. Also anticipates her daughter being available to provide occasional assistance. Denies other urgent concerns. [name] agreed to keep the care management  team updated of [name] needs. She is aware of the pending outreach with the [place] to confirm transportation for his upcoming appointments.      Patient Self Care Activities:  Self administers medications Attends scheduled provider appointments Performs ADL's independently Calls provider office for new concerns or questions   Please see past updates related to this goal by clicking on the "Past Updates" button in the selected goal       Increase water intake     Recommend increasing water intake to 4-6 glasses a day.        Immunization History  Administered Date(s) Administered   Fluad Quad(high Dose 65+) 10/23/2019, 10/01/2020, 10/21/2021   Influenza, High Dose Seasonal PF 10/07/2015, 10/16/2016, 09/02/2017, 09/19/2018   Influenza-Unspecified 09/19/2014   PFIZER(Purple Top)SARS-COV-2 Vaccination 01/16/2020, 02/09/2020, 09/30/2020   Pneumococcal Conjugate-13 06/03/2014   Pneumococcal Polysaccharide-23 04/16/2003, 08/19/2016, 07/28/2017, 11/08/2019, 09/30/2020   Td 03/04/1997   Tdap 11/09/2011   Zoster, Live 05/09/2008, 06/03/2014  Health Maintenance  Topic Date Due   Zoster Vaccines- Shingrix (1 of 2) Never done   COVID-19 Vaccine (4 - Booster for Pfizer series) 11/25/2020   TETANUS/TDAP  11/08/2021   Pneumonia Vaccine 56+ Years old  Completed   INFLUENZA VACCINE  Completed   HPV VACCINES  Aged Out     Discussed health benefits of physical activity, and encouraged him to engage in regular exercise appropriate for his age and condition.         The entirety of the information documented in the History of Present Illness, Review of Systems and Physical Exam were personally obtained by me. Portions of this information were initially documented by the CMA and reviewed by me for thoroughness and accuracy.     Lelon Huh, MD  [facility] [phone] (phone) [phone] (fax)  [place]

## 2021-10-22 LAB — RENAL FUNCTION PANEL
Albumin: 4.4 g/dL (ref 3.6–4.6)
BUN/Creatinine Ratio: 10 (ref 10–24)
BUN: 10 mg/dL (ref 8–27)
CO2: 26 mmol/L (ref 20–29)
Calcium: 9.3 mg/dL (ref 8.6–10.2)
Chloride: 94 mmol/L — ABNORMAL LOW (ref 96–106)
Creatinine, Ser: 0.99 mg/dL (ref 0.76–1.27)
Glucose: 81 mg/dL (ref 70–99)
Phosphorus: 3.1 mg/dL (ref 2.8–4.1)
Potassium: 4.1 mmol/L (ref 3.5–5.2)
Sodium: 133 mmol/L — ABNORMAL LOW (ref 134–144)
eGFR: 75 mL/min/{1.73_m2} (ref >59)

## 2021-10-22 LAB — BRAIN NATRIURETIC PEPTIDE: BNP: 59.2 pg/mL (ref 0.0–100.0)

## 2021-10-22 LAB — TSH: TSH: 4.73 u[IU]/mL — ABNORMAL HIGH (ref 0.450–4.500)

## 2021-11-06 DIAGNOSIS — H401133 Primary open-angle glaucoma, bilateral, severe stage: Secondary | ICD-10-CM | POA: Diagnosis not present

## 2021-11-09 DIAGNOSIS — R0600 Dyspnea, unspecified: Secondary | ICD-10-CM | POA: Diagnosis not present

## 2021-11-09 DIAGNOSIS — J984 Other disorders of lung: Secondary | ICD-10-CM | POA: Diagnosis not present

## 2021-11-09 DIAGNOSIS — R0609 Other forms of dyspnea: Secondary | ICD-10-CM | POA: Diagnosis not present

## 2021-11-11 DIAGNOSIS — Z0100 Encounter for examination of eyes and vision without abnormal findings: Secondary | ICD-10-CM | POA: Diagnosis not present

## 2021-11-11 DIAGNOSIS — H401132 Primary open-angle glaucoma, bilateral, moderate stage: Secondary | ICD-10-CM | POA: Diagnosis not present

## 2021-12-15 DIAGNOSIS — Z96652 Presence of left artificial knee joint: Secondary | ICD-10-CM | POA: Diagnosis not present

## 2021-12-29 NOTE — Progress Notes (Signed)
Established patient visit   Patient: Daniel Carr   DOB: July 27, 1935   87 y.o. Male  MRN: [id number] Visit Date: 12/30/2021  Today's healthcare provider: Mikey Kirschner, PA-C   Chief Complaint  Patient presents with   Cough   I,Daniel Carr,acting as a scribe for Mikey Kirschner, PA-C.,have documented all relevant documentation on the behalf of Mikey Kirschner, PA-C,as directed by  Mikey Kirschner, PA-C while in the presence of Mikey Kirschner, PA-C.  Subjective    HPI  Daniel Carr is an 87 y/o male who presents today with a nonproductive cough x 1-2 weeks. Reports cough is more an annoyance, no increase in chronic SOB, PND. Denies wheezing, fevers, chills, body aches.  Patient does have a history of smoking. Using delsum and alka seltzer cold and flu with limited success. Reports a significant amount of stress as wife is entering hospice.  Medications: Outpatient Medications Prior to Visit  Medication Sig   amLODipine (NORVASC) 2.5 MG tablet Take 1 tablet (2.5 mg total) by mouth daily.   aspirin 81 MG EC tablet Take 1 tablet (81 mg total) by mouth daily. Swallow whole.   azelastine (ASTELIN) 0.1 % nasal spray Place 1 spray into the nose daily as needed (allergies).   bimatoprost (LUMIGAN) 0.01 % SOLN Place 1 drop into both eyes at bedtime.   brimonidine (ALPHAGAN) 0.2 % ophthalmic solution Place 1 drop into both eyes in the morning and at bedtime.   clotrimazole-betamethasone (LOTRISONE) cream Apply 1 application topically daily.   COD LIVER OIL PO Take 1 capsule by mouth daily.   dutasteride (AVODART) 0.5 MG capsule Take 1 capsule (0.5 mg total) by mouth daily.   hydrochlorothiazide (HYDRODIURIL) 25 MG tablet Take 0.5 tablets (12.5 mg total) by mouth daily.   ipratropium (ATROVENT) 0.06 % nasal spray Place 1 spray into both nostrils 2 (two) times daily as needed for rhinitis.   levocetirizine (XYZAL) 5 MG tablet Take 5 mg by mouth every evening.   lovastatin (MEVACOR) 40 MG tablet  Take 1 tablet (40 mg total) by mouth daily.   Multiple Vitamin (DAILY VITAMIN PO) Take 1 tablet by mouth daily.   SYNTHROID 125 MCG tablet Take 1 tablet by mouth once daily   traMADol (ULTRAM) 50 MG tablet Take 1 tablet (50 mg total) by mouth every 4 (four) hours as needed for moderate pain.   Travoprost, BAK Free, (TRAVATAN) 0.004 % SOLN ophthalmic solution Apply 1 drop to eye at bedtime.   [DISCONTINUED] Nirmatrelvir & Ritonavir (PAXLOVID) 20 x 150 MG & 10 x 100MG  TBPK Take 2 nirmatrelvir (150 mg) tablets and take 1 ritonavir (100 mg) tablet (3 tablets total) by mouth two times daily for 5 days   No facility-administered medications prior to visit.    Review of Systems  Constitutional:  Negative for chills and fever.  HENT:  Positive for congestion, rhinorrhea, sinus pain and sneezing. Negative for ear pain, sinus pressure and sore throat.   Respiratory:  Positive for cough, shortness of breath and wheezing.        Objective    BP 127/80 (BP Location: Right Arm, Patient Position: Sitting, Cuff Size: Large)    Pulse 71    Temp 98.7 F (37.1 C) (Oral)    Resp 16    Wt 213 lb 8 oz (96.8 kg)    SpO2 100%    BMI 28.17 kg/m  BP Readings from Last 3 Encounters:  12/30/21 127/80  10/21/21 127/77  07/06/21 121/71  Wt Readings from Last 3 Encounters:  12/30/21 213 lb 8 oz (96.8 kg)  10/21/21 209 lb (94.8 kg)  07/06/21 213 lb (96.6 kg)      Physical Exam Constitutional:      General: He is awake.     Appearance: He is well-developed.  HENT:     Head: Normocephalic.  Eyes:     Conjunctiva/sclera: Conjunctivae normal.  Cardiovascular:     Rate and Rhythm: Normal rate and regular rhythm.     Heart sounds: Normal heart sounds.  Pulmonary:     Effort: Pulmonary effort is normal. No respiratory distress.     Breath sounds: Normal breath sounds. No stridor. No wheezing, rhonchi or rales.  Skin:    General: Skin is warm.  Neurological:     Mental Status: He is alert and oriented to  person, place, and time.  Psychiatric:        Attention and Perception: Attention normal.        Mood and Affect: Mood normal.        Speech: Speech normal.        Behavior: Behavior is cooperative.     No results found for any visits on 12/30/21.  Assessment & Plan     Acute bronchitis Increase fluid intake Rx tessalon up to TID for cough   Return in about 2 months (around 02/27/2022) for as scheduled.      I, Mikey Kirschner, PA-C have reviewed all documentation for this visit. The documentation on  12/30/2021 for the exam, diagnosis, procedures, and orders are all accurate and complete.    Mikey Kirschner, PA-C  [facility] [phone] (phone) [phone] (fax)  [place]

## 2021-12-30 ENCOUNTER — Encounter: Payer: Self-pay | Admitting: Physician Assistant

## 2021-12-30 ENCOUNTER — Ambulatory Visit (INDEPENDENT_AMBULATORY_CARE_PROVIDER_SITE_OTHER): Payer: Medicare HMO | Admitting: Physician Assistant

## 2021-12-30 ENCOUNTER — Other Ambulatory Visit: Payer: Self-pay

## 2021-12-30 VITALS — BP 127/80 | HR 71 | Temp 98.7°F | Resp 16 | Wt 213.5 lb

## 2021-12-30 DIAGNOSIS — J209 Acute bronchitis, unspecified: Secondary | ICD-10-CM | POA: Diagnosis not present

## 2021-12-30 MED ORDER — BENZONATATE 100 MG PO CAPS: 100.0000 mg | ORAL_CAPSULE | Freq: Three times a day (TID) | ORAL | 0 refills | Status: AC | PRN

## 2021-12-31 ENCOUNTER — Other Ambulatory Visit: Payer: Self-pay | Admitting: Family Medicine

## 2021-12-31 DIAGNOSIS — E7800 Pure hypercholesterolemia, unspecified: Secondary | ICD-10-CM

## 2022-01-12 ENCOUNTER — Other Ambulatory Visit: Payer: Self-pay | Admitting: Family Medicine

## 2022-01-12 DIAGNOSIS — E039 Hypothyroidism, unspecified: Secondary | ICD-10-CM

## 2022-01-12 DIAGNOSIS — R351 Nocturia: Secondary | ICD-10-CM

## 2022-01-12 DIAGNOSIS — N401 Enlarged prostate with lower urinary tract symptoms: Secondary | ICD-10-CM

## 2022-01-12 NOTE — Telephone Encounter (Signed)
Requested Prescriptions  Pending Prescriptions Disp Refills   dutasteride (AVODART) 0.5 MG capsule [Pharmacy Med Name: Dutasteride 0.5 MG Oral Capsule] 90 capsule 0    Sig: Take 1 capsule by mouth once daily     Urology: 5-alpha Reductase Inhibitors Passed - 01/12/2022  9:12 AM      Passed - Valid encounter within last 12 months    Recent Outpatient Visits          1 week ago Acute bronchitis, unspecified organism   [facility] Thedore Mins, [place], PA-C   2 months ago Annual physical exam   [facility] [name], MD   6 months ago Hypertension, benign   [facility] [name], MD   9 months ago Other acute pulmonary embolism without acute cor pulmonale [facility])   [facility] [name], MD   11 months ago Other acute pulmonary embolism without acute cor pulmonale [facility])   [place], [name], MD      Future Appointments            In 2 months [name], [name], MD [facility], [facility]   In 8 months Rogue Bussing, Elisha [place], MD [facility] [place] 125 MCG tablet [Pharmacy Med Name: Synthroid 125 MCG Oral Tablet] 90 tablet 0    Sig: Take 1 tablet by mouth once daily     Endocrinology:  Hypothyroid Agents Failed - 01/12/2022  9:12 AM      Failed - TSH needs to be rechecked within 3 months after an abnormal result. Refill until TSH is due.      Failed - TSH in normal range and within 360 days    TSH  Date Value Ref Range Status  10/21/2021 4.730 (H) 0.450 - 4.500 uIU/mL Final         Passed - Valid encounter within last 12 months    Recent Outpatient Visits          1 week ago Acute bronchitis, unspecified organism   [facility] Thedore Mins, [place], PA-C   2 months ago Annual physical exam   [facility] [name], MD   6 months ago Hypertension, benign   [facility] [name], MD   9 months ago Other acute pulmonary embolism without acute cor pulmonale [facility])   [facility] [name], MD   11 months ago Other acute pulmonary embolism without acute cor pulmonale [facility])   [place], [name] E, MD      Future Appointments            In 2 months [name], [name], MD [facility], [facility]   In 8 months Rogue Bussing, Elisha Headland, MD [facility] [place]

## 2022-01-12 NOTE — Telephone Encounter (Signed)
Requested medication (s) are due for refill today: yes  Requested medication (s) are on the active medication list: yes    Last refill: 13/13/22 #90  0 refills  Future visit scheduled yes 03/26/22  Notes to clinic:Failed due to labs. Please review. Thank you.  Requested Prescriptions  Pending Prescriptions Disp Refills   SYNTHROID 125 MCG tablet [Pharmacy Med Name: Synthroid 125 MCG Oral Tablet] 90 tablet 0    Sig: Take 1 tablet by mouth once daily     Endocrinology:  Hypothyroid Agents Failed - 01/12/2022  9:12 AM      Failed - TSH needs to be rechecked within 3 months after an abnormal result. Refill until TSH is due.      Failed - TSH in normal range and within 360 days    TSH  Date Value Ref Range Status  10/21/2021 4.730 (H) 0.450 - 4.500 uIU/mL Final          Passed - Valid encounter within last 12 months    Recent Outpatient Visits           1 week ago Acute bronchitis, unspecified organism   [facility] Thedore Mins, [place], PA-C   2 months ago Annual physical exam   [facility] [name], MD   6 months ago Hypertension, benign   [facility] [name], MD   9 months ago Other acute pulmonary embolism without acute cor pulmonale [facility])   [name], MD   11 months ago Other acute pulmonary embolism without acute cor pulmonale [facility])   [facility] [name], MD       Future Appointments             In 2 months [name], [name], MD  [facility]   In 8 months Cammie Sickle, MD [facility] [place] Refills   dutasteride (AVODART) 0.5 MG capsule 90 capsule 0    Sig: Take 1 capsule by mouth once daily     Urology: 5-alpha Reductase Inhibitors Passed - 01/12/2022  9:12 AM      Passed - Valid encounter within last 12 months    Recent Outpatient Visits           1  week ago Acute bronchitis, unspecified organism   [facility] Thedore Mins, [place], PA-C   2 months ago Annual physical exam   [facility] [name], MD   6 months ago Hypertension, benign   [facility] [name], MD   9 months ago Other acute pulmonary embolism without acute cor pulmonale [facility])   [facility] [name], MD   11 months ago Other acute pulmonary embolism without acute cor pulmonale [facility])   [place], [name], MD       Future Appointments             In 2 months [name], [name], MD [facility], [facility]   In 8 months Rogue Bussing, Elisha Headland, MD [facility] [place]

## 2022-03-22 DIAGNOSIS — R0600 Dyspnea, unspecified: Secondary | ICD-10-CM | POA: Diagnosis not present

## 2022-03-26 ENCOUNTER — Ambulatory Visit: Payer: Medicare HMO | Admitting: Family Medicine

## 2022-03-26 ENCOUNTER — Other Ambulatory Visit: Payer: Self-pay | Admitting: Family Medicine

## 2022-03-26 DIAGNOSIS — I10 Essential (primary) hypertension: Secondary | ICD-10-CM

## 2022-03-26 NOTE — Telephone Encounter (Signed)
Requested Prescriptions  ?Pending Prescriptions Disp Refills  ?? amLODipine (NORVASC) 2.5 MG tablet [Pharmacy Med Name: amLODIPine Besylate 2.5 MG Oral Tablet] 90 tablet 0  ?  Sig: Take 1 tablet by mouth once daily  ?  ? Cardiovascular: Calcium Channel Blockers 2 Passed - 03/26/2022 10:57 AM  ?  ?  Passed - Last BP in normal range  ?  BP Readings from Last 1 Encounters:  ?12/30/21 127/80  ?   ?  ?  Passed - Last Heart Rate in normal range  ?  Pulse Readings from Last 1 Encounters:  ?12/30/21 71  ?   ?  ?  Passed - Valid encounter within last 6 months  ?  Recent Outpatient Visits   ?      ? 2 months ago Acute bronchitis, unspecified organism  ? [facility] Mikey Kirschner, PA-C  ? 5 months ago Annual physical exam  ? [facility] [name], MD  ? 8 months ago Hypertension, benign  ? [facility] [name], [name], MD  ? 1 year ago Other acute pulmonary embolism without acute cor pulmonale ([place])  ? [facility] [name], [name], MD  ? 1 year ago Other acute pulmonary embolism without acute cor pulmonale ([place])  ? [facility] Caryn Section, [name], MD  ?  ?  ?Future Appointments   ?        ? In 3 days [name], [name], MD [facility], [facility]  ? In 5 months Cammie Sickle, MD [facility] [place]  ?  ? ?  ?  ?  ? ?

## 2022-03-29 ENCOUNTER — Ambulatory Visit (INDEPENDENT_AMBULATORY_CARE_PROVIDER_SITE_OTHER): Payer: Medicare HMO | Admitting: Family Medicine

## 2022-03-29 ENCOUNTER — Encounter: Payer: Self-pay | Admitting: Family Medicine

## 2022-03-29 VITALS — BP 125/73 | HR 64 | Temp 97.8°F | Resp 14 | Wt 215.0 lb

## 2022-03-29 DIAGNOSIS — R748 Abnormal levels of other serum enzymes: Secondary | ICD-10-CM

## 2022-03-29 DIAGNOSIS — E039 Hypothyroidism, unspecified: Secondary | ICD-10-CM | POA: Diagnosis not present

## 2022-03-29 DIAGNOSIS — I10 Essential (primary) hypertension: Secondary | ICD-10-CM | POA: Diagnosis not present

## 2022-03-29 DIAGNOSIS — E7800 Pure hypercholesterolemia, unspecified: Secondary | ICD-10-CM | POA: Diagnosis not present

## 2022-03-29 NOTE — Progress Notes (Signed)
?  ? ?I,[name] L Chambers,acting as a scribe for Lelon Huh, MD.,have documented all relevant documentation on the behalf of Lelon Huh, MD,as directed by  Lelon Huh, MD while in the presence of Lelon Huh, MD.  ? ?Established patient visit ? ? ?Patient: Daniel Carr   DOB: Jun 26, 1935   87 y.o. Male  MRN: [id number] ?Visit Date: 03/29/2022 ? ?Today's healthcare provider: Lelon Huh, MD  ? ?Chief Complaint  ?Patient presents with  ? Hypertension  ? Hyperlipidemia  ? Hypothyroidism  ? ?Subjective  ?  ?HPI  ?Hypertension, follow-up ? ?BP Readings from Last 3 Encounters:  ?03/29/22 125/73  ?12/30/21 127/80  ?10/21/21 127/77  ? Wt Readings from Last 3 Encounters:  ?03/29/22 215 lb (97.5 kg)  ?12/30/21 213 lb 8 oz (96.8 kg)  ?10/21/21 209 lb (94.8 kg)  ?  ? ?He was last seen for hypertension 6 months ago.  ?BP at that visit was 127/77. ?Management since that visit includes; Well controlled.  Continue current medications.  ?He reports good compliance with treatment. ?He is not having side effects.  ?He is not exercising. ?He is adherent to low salt diet.   ?Outside blood pressures are not checked. ? ?He does not smoke. ? ?Use of agents associated with hypertension: NSAIDS.  ? ?--------------------------------------------------------------------------------------------------- ?Lipid/Cholesterol, follow-up ? ?Last Lipid Panel: ?Lab Results  ?Component Value Date  ? CHOL 162 07/06/2021  ? [place] 74 07/06/2021  ? HDL 76 07/06/2021  ? TRIG 61 07/06/2021  ? ? ?He was last seen for this 9 months ago.  ?Management since that visit includes continue the same dose of lovastatin. He reports good compliance with treatment. ?He is not having side effects.  ? ?He is following a Regular diet. ?Current exercise: none ? ?Last metabolic panel ?Lab Results  ?Component Value Date  ? GLUCOSE 81 10/21/2021  ? NA 133 (L) 10/21/2021  ? K 4.1 10/21/2021  ? BUN 10 10/21/2021  ? CREATININE 0.99 10/21/2021  ? EGFR 75 10/21/2021  ?  GFRNONAA >60 09/11/2021  ? CALCIUM 9.3 10/21/2021  ? AST 18 02/05/2021  ? ALT 6 02/05/2021  ? ?The ASCVD Risk score (Arnett DK, et al., 2019) failed to calculate for the following reasons: ?  The 2019 ASCVD risk score is only valid for ages 34 to 37 ? ?--------------------------------------------------------------------------------------------------- ?Hypothyroid, follow-up ? ?Lab Results  ?Component Value Date  ? TSH 4.730 (H) 10/21/2021  ? TSH 6.180 (H) 07/06/2021  ? TSH 5.200 (H) 02/05/2021  ? FREET4 1.85 (H) 07/22/2020  ? ? ?Wt Readings from Last 3 Encounters:  ?03/29/22 215 lb (97.5 kg)  ?12/30/21 213 lb 8 oz (96.8 kg)  ?10/21/21 209 lb (94.8 kg)  ? ? ?He was last seen for hypothyroid 6 months ago.  ?Management since that visit includes;labs checked showing-no changes. ?He reports good compliance with treatment. ?He is not having side effects.  ? ?----------------------------------------------------------------------------------------- ? ? ?Medications: ?Outpatient Medications Prior to Visit  ?Medication Sig  ? amLODipine (NORVASC) 2.5 MG tablet Take 1 tablet by mouth once daily  ? aspirin 81 MG EC tablet Take 1 tablet (81 mg total) by mouth daily. Swallow whole.  ? azelastine (ASTELIN) 0.1 % nasal spray Place 1 spray into the nose daily as needed (allergies).  ? brimonidine (ALPHAGAN) 0.2 % ophthalmic solution Place 1 drop into both eyes in the morning and at bedtime.  ? COD LIVER OIL PO Take 1 capsule by mouth daily.  ? dorzolamide (TRUSOPT) 2 % ophthalmic solution Apply to eye.  ?  dutasteride (AVODART) 0.5 MG capsule Take 1 capsule by mouth once daily  ? hydrochlorothiazide (HYDRODIURIL) 25 MG tablet Take 0.5 tablets (12.5 mg total) by mouth daily.  ? ipratropium (ATROVENT) 0.06 % nasal spray Place 1 spray into both nostrils 2 (two) times daily as needed for rhinitis.  ? latanoprost (XALATAN) 0.005 % ophthalmic solution SMARTSIG:In Eye(s)  ? levocetirizine (XYZAL) 5 MG tablet Take 5 mg by mouth every  evening.  ? lovastatin (MEVACOR) 40 MG tablet Take 1 tablet by mouth once daily  ? Multiple Vitamin (DAILY VITAMIN PO) Take 1 tablet by mouth daily.  ? STIOLTO RESPIMAT 2.5-2.5 MCG/ACT AERS 2 puffs daily.  ? SYNTHROID 125 MCG tablet Take 1 tablet by mouth once daily  ? traMADol (ULTRAM) 50 MG tablet Take 1 tablet (50 mg total) by mouth every 4 (four) hours as needed for moderate pain.  ? clotrimazole-betamethasone (LOTRISONE) cream Apply 1 application topically daily. (Patient not taking: Reported on 03/29/2022)  ? [DISCONTINUED] bimatoprost (LUMIGAN) 0.01 % SOLN Place 1 drop into both eyes at bedtime.  ? [DISCONTINUED] Travoprost, BAK Free, (TRAVATAN) 0.004 % SOLN ophthalmic solution Apply 1 drop to eye at bedtime. (Patient not taking: Reported on 03/29/2022)  ? ?No facility-administered medications prior to visit.  ? ? ?Review of Systems  ?Constitutional:  Negative for appetite change, chills and fever.  ?Respiratory:  Negative for chest tightness, shortness of breath and wheezing.   ?Cardiovascular:  Negative for chest pain and palpitations.  ?Gastrointestinal:  Negative for abdominal pain, nausea and vomiting.  ? ? ?  Objective  ?  ?BP 125/73 (BP Location: Left Arm, Patient Position: Sitting, Cuff Size: Large)   Pulse 64   Temp 97.8 ?F (36.6 ?C) (Oral)   Resp 14   Wt 215 lb (97.5 kg)   SpO2 100% Comment: room air  BMI 28.37 kg/m?  ? ? ?Physical Exam  ? ? ?General: Appearance:     ?Well developed, well nourished male in no acute distress  ?Eyes:    PERRL, conjunctiva/corneas clear, EOM's intact       ?Lungs:     Clear to auscultation bilaterally, respirations unlabored  ?Heart:    Normal heart rate. Normal rhythm.  ?3/6 systolic murmur at right upper sternal border   ?MS:   All extremities are intact.    ?Neurologic:   Awake, alert, oriented x 3. No apparent focal neurological defect.   ?   ?  ? Assessment & Plan  ?  ? ?1. Hypertension, benign ?Well controlled.  Continue current medications.   ? ?2.  Hypothyroidism, unspecified type ? ?- TSH ? ?3. Pure hypercholesterolemia ?He is tolerating lovastatin well with no adverse effects.   ?- Comprehensive metabolic panel ?- Lipid panel ? ?4. Elevated amylase ? ?- Amylase ? ?   ? ?The entirety of the information documented in the History of Present Illness, Review of Systems and Physical Exam were personally obtained by me. Portions of this information were initially documented by the CMA and reviewed by me for thoroughness and accuracy.   ? ? ?Lelon Huh, MD  ?[facility] ?[phone] (phone) ?[phone] (fax) ? ?[facility] ?

## 2022-03-30 LAB — COMPREHENSIVE METABOLIC PANEL
ALT: 11 IU/L (ref 0–44)
AST: 23 IU/L (ref 0–40)
Albumin/Globulin Ratio: 1.8 (ref 1.2–2.2)
Albumin: 4.2 g/dL (ref 3.6–4.6)
Alkaline Phosphatase: 43 IU/L — ABNORMAL LOW (ref 44–121)
BUN/Creatinine Ratio: 11 (ref 10–24)
BUN: 11 mg/dL (ref 8–27)
Bilirubin Total: 0.5 mg/dL (ref 0.0–1.2)
CO2: 28 mmol/L (ref 20–29)
Calcium: 9.4 mg/dL (ref 8.6–10.2)
Chloride: 95 mmol/L — ABNORMAL LOW (ref 96–106)
Creatinine, Ser: 1 mg/dL (ref 0.76–1.27)
Globulin, Total: 2.4 g/dL (ref 1.5–4.5)
Glucose: 79 mg/dL (ref 70–99)
Potassium: 3.9 mmol/L (ref 3.5–5.2)
Sodium: 133 mmol/L — ABNORMAL LOW (ref 134–144)
Total Protein: 6.6 g/dL (ref 6.0–8.5)
eGFR: 74 mL/min/{1.73_m2} (ref >59)

## 2022-03-30 LAB — LIPID PANEL
Chol/HDL Ratio: 2 ratio (ref 0.0–5.0)
Cholesterol, Total: 159 mg/dL (ref 100–199)
HDL: 81 mg/dL (ref >39)
LDL Chol Calc (NIH): 66 mg/dL (ref 0–99)
Triglycerides: 60 mg/dL (ref 0–149)
VLDL Cholesterol Cal: 12 mg/dL (ref 5–40)

## 2022-03-30 LAB — AMYLASE: Amylase: 213 U/L — ABNORMAL HIGH (ref 31–110)

## 2022-03-30 LAB — TSH: TSH: 14.1 u[IU]/mL — ABNORMAL HIGH (ref 0.450–4.500)

## 2022-04-05 ENCOUNTER — Other Ambulatory Visit: Payer: Self-pay | Admitting: Family Medicine

## 2022-04-05 DIAGNOSIS — R21 Rash and other nonspecific skin eruption: Secondary | ICD-10-CM

## 2022-04-05 DIAGNOSIS — N401 Enlarged prostate with lower urinary tract symptoms: Secondary | ICD-10-CM

## 2022-04-06 ENCOUNTER — Telehealth: Payer: Self-pay

## 2022-04-06 NOTE — Telephone Encounter (Signed)
Lab results are in chart awaiting review from provider. Dr. Caryn Section is out of the office this week. Please review results and advise if ok to wait for Dr. Caryn Section to review.  ?

## 2022-04-06 NOTE — Telephone Encounter (Signed)
Provider has not yet signed off on most recent labs ? ? ? ?Copied from [place] [phone]. Topic: General - Call Back - No Documentation ?>> Apr 06, 2022  9:48 AM Erick Blinks wrote: ?Reason for CRM: Pt wants to discuss his lab results, please advise if [facility] may disclose ?[phone] ?

## 2022-04-09 DIAGNOSIS — H401133 Primary open-angle glaucoma, bilateral, severe stage: Secondary | ICD-10-CM | POA: Diagnosis not present

## 2022-04-12 ENCOUNTER — Telehealth: Payer: Self-pay

## 2022-04-12 DIAGNOSIS — R748 Abnormal levels of other serum enzymes: Secondary | ICD-10-CM

## 2022-04-12 DIAGNOSIS — E039 Hypothyroidism, unspecified: Secondary | ICD-10-CM

## 2022-04-12 MED ORDER — LEVOTHYROXINE SODIUM 150 MCG PO TABS: 150.0000 ug | ORAL_TABLET | Freq: Every day | ORAL | 2 refills | Status: DC

## 2022-04-12 NOTE — Telephone Encounter (Signed)
Patient aware. Noticed patient has Synthroid in his medication list. Patient reported that Synthroid was high and prefers generic send in. ?

## 2022-04-12 NOTE — Telephone Encounter (Signed)
-----   Message from [name], MD sent at 04/12/2022 12:33 PM EDT ----- ?Need to increase levothyroxine to 145mg once a day, please send in prescription #90 rf x 2.  ?Also pancreatic enzymes are high. Need to order abdominal ultrasound for evaluation of elevated amylase.  ?Follow up in October as scheduled.  ?

## 2022-04-12 NOTE — Telephone Encounter (Signed)
Please advise on lab results.

## 2022-04-15 ENCOUNTER — Ambulatory Visit
Admission: RE | Admit: 2022-04-15 | Discharge: 2022-04-15 | Disposition: A | Discharge status: Home / Self Care | Payer: Medicare HMO | Source: Ambulatory Visit | Attending: Family Medicine | Admitting: Family Medicine

## 2022-04-15 DIAGNOSIS — R748 Abnormal levels of other serum enzymes: Secondary | ICD-10-CM | POA: Diagnosis not present

## 2022-04-15 DIAGNOSIS — K8020 Calculus of gallbladder without cholecystitis without obstruction: Secondary | ICD-10-CM | POA: Diagnosis not present

## 2022-04-25 ENCOUNTER — Encounter: Payer: Self-pay | Admitting: Family Medicine

## 2022-04-25 DIAGNOSIS — R748 Abnormal levels of other serum enzymes: Secondary | ICD-10-CM | POA: Insufficient documentation

## 2022-05-05 IMAGING — DX DG KNEE 1-2V PORT*L*
2 series · 2 of 2 positions shown · non-contrast
Comparison: None.

CLINICAL DATA: Left knee replacement

EXAM:
PORTABLE LEFT KNEE - 1-2 VIEW

[knee ap]
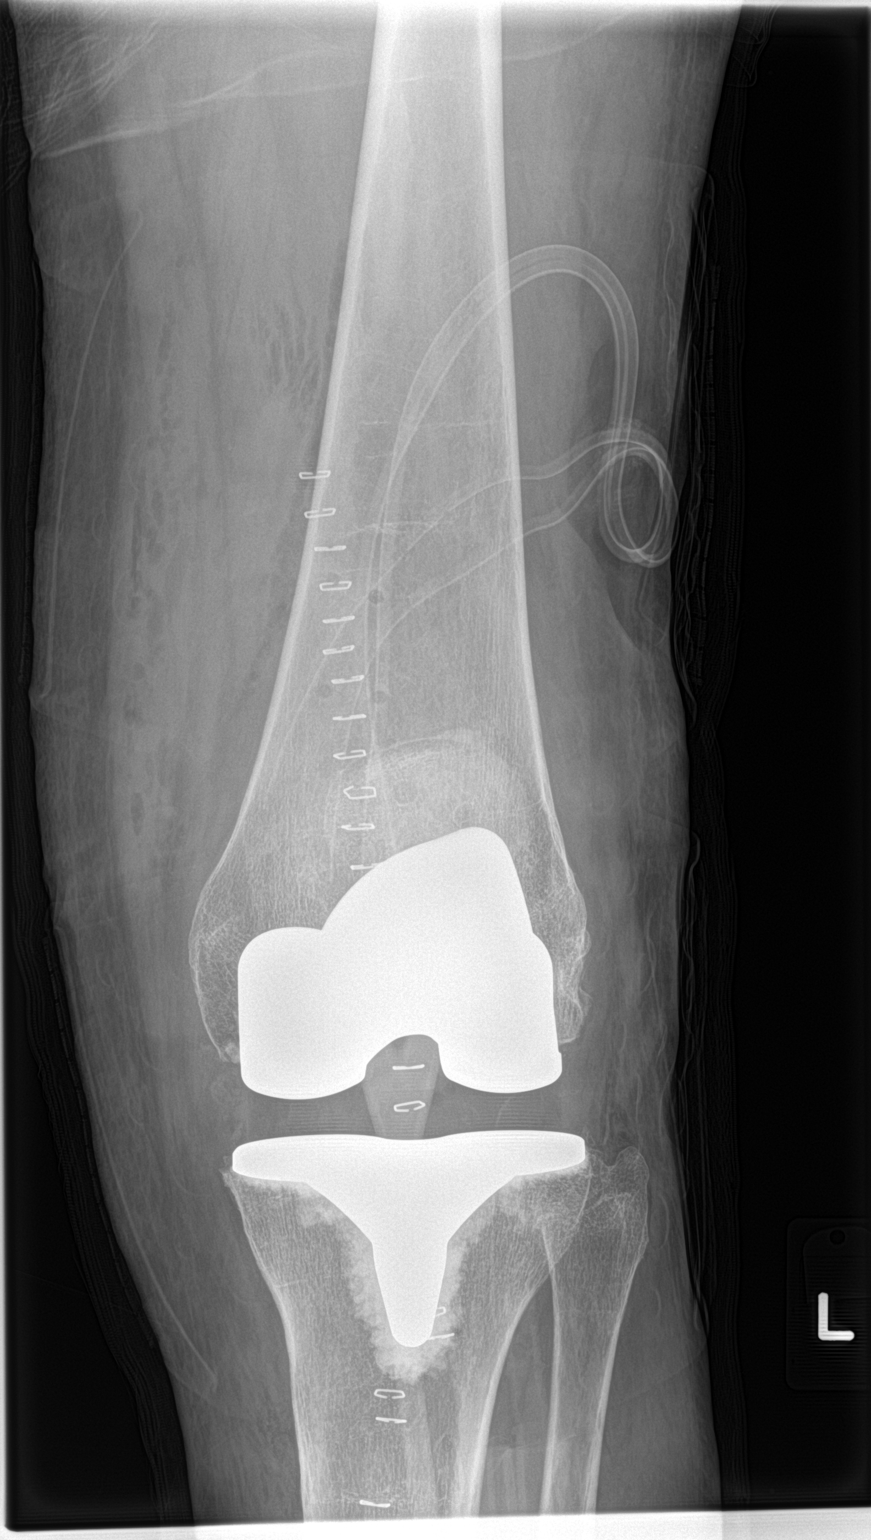

[knee lat]
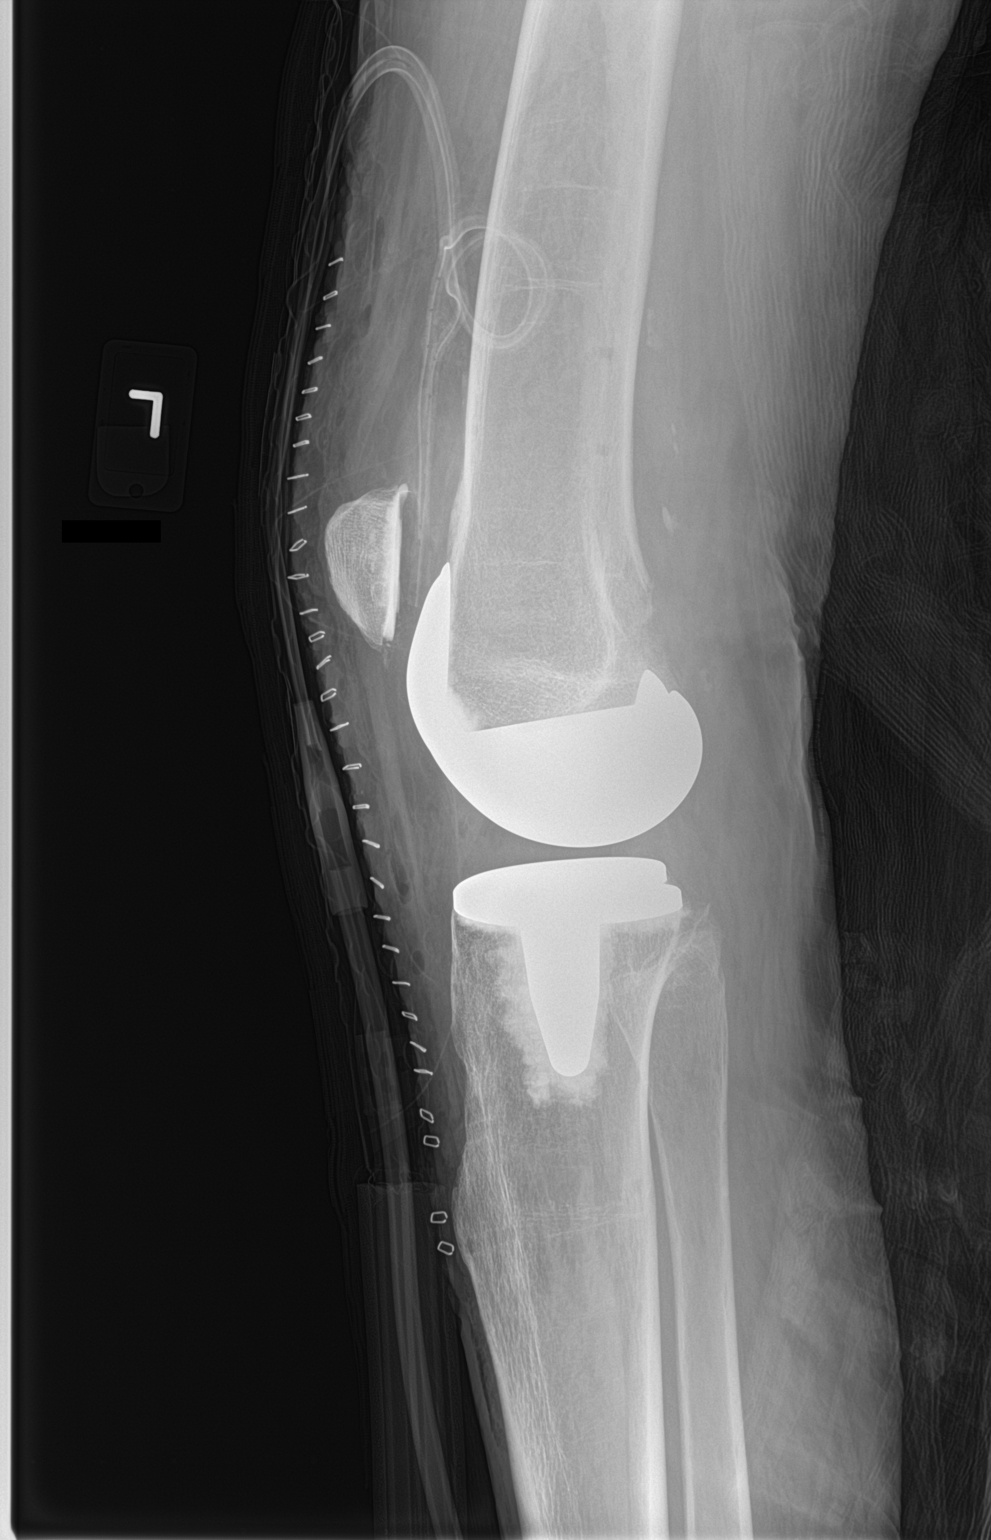

[2 of 2 positions shown; findings below may reference images not displayed]

FINDINGS: The patient is status post left total knee arthroplasty. No
periprosthetic lucency or fracture seen. Overlying surgical drain
and surgical staples are noted.
IMPRESSION: Status post left total knee arthroplasty without acute complication.

## 2022-05-14 ENCOUNTER — Ambulatory Visit (INDEPENDENT_AMBULATORY_CARE_PROVIDER_SITE_OTHER): Payer: Medicare HMO | Admitting: Physician Assistant

## 2022-05-14 VITALS — BP 145/79 | HR 72 | Temp 98.2°F | Wt 219.0 lb

## 2022-05-14 DIAGNOSIS — Z7409 Other reduced mobility: Secondary | ICD-10-CM

## 2022-05-14 DIAGNOSIS — R6889 Other general symptoms and signs: Secondary | ICD-10-CM | POA: Diagnosis not present

## 2022-05-14 DIAGNOSIS — R531 Weakness: Secondary | ICD-10-CM

## 2022-05-14 NOTE — Progress Notes (Signed)
Established patient visit   Patient: Daniel Carr   DOB: 1935-10-26   87 y.o. Male  MRN: [id number] Visit Date: 05/14/2022  Today's healthcare provider: Mardene Speak, PA-C   No chief complaint on file.  Subjective    HPI  Patient is an 87 year old male who presents today requesting order for a wheel chair.  Patient would like motorized scooter due weakness of his right arm/elbow, in both feet./deformities of his feet/arthritis.  Patient endorses having SOB with walking short distance. He has been bound to house because his mobility issues and he would really want to be able to get out of the house   Has arthritis of multiple joints, arthropathy, neuropathy of ulnar nerve "Stumbles often when walks"  Medications: Outpatient Medications Prior to Visit  Medication Sig   Alpha-Lipoic Acid 100 MG CAPS Take by mouth.   amLODipine (NORVASC) 2.5 MG tablet Take 1 tablet by mouth once daily   aspirin 81 MG EC tablet Take 1 tablet (81 mg total) by mouth daily. Swallow whole.   azelastine (ASTELIN) 0.1 % nasal spray Place 1 spray into the nose daily as needed (allergies).   brimonidine (ALPHAGAN) 0.2 % ophthalmic solution Place 1 drop into both eyes in the morning and at bedtime.   clotrimazole-betamethasone (LOTRISONE) cream APPLY TOPICALLY ONCE DAILY   COD LIVER OIL PO Take 1 capsule by mouth daily.   dorzolamide (TRUSOPT) 2 % ophthalmic solution Apply to eye.   dutasteride (AVODART) 0.5 MG capsule Take 1 capsule by mouth once daily   hydrochlorothiazide (HYDRODIURIL) 25 MG tablet Take 0.5 tablets (12.5 mg total) by mouth daily.   ipratropium (ATROVENT) 0.06 % nasal spray Place 1 spray into both nostrils 2 (two) times daily as needed for rhinitis.   latanoprost (XALATAN) 0.005 % ophthalmic solution SMARTSIG:In Eye(s)   levocetirizine (XYZAL) 5 MG tablet Take 5 mg by mouth every evening.   levothyroxine (SYNTHROID) 150 MCG tablet Take 1 tablet (150 mcg total) by mouth daily.    lovastatin (MEVACOR) 40 MG tablet Take 1 tablet by mouth once daily   Multiple Vitamin (DAILY VITAMIN PO) Take 1 tablet by mouth daily.   STIOLTO RESPIMAT 2.5-2.5 MCG/ACT AERS 2 puffs daily.   SYNTHROID 125 MCG tablet Take 1 tablet by mouth once daily   traMADol (ULTRAM) 50 MG tablet Take 1 tablet (50 mg total) by mouth every 4 (four) hours as needed for moderate pain.   No facility-administered medications prior to visit.    Review of Systems  Musculoskeletal:  Positive for arthralgias, gait problem, joint swelling and myalgias.      Objective    BP (!) 145/79 (BP Location: Right Arm, Patient Position: Sitting, Cuff Size: Normal)   Pulse 72   Temp 98.2 F (36.8 C) (Oral)   Wt 219 lb (99.3 kg)   SpO2 98%   BMI 28.89 kg/m    Physical Exam Vitals reviewed.  Constitutional:      Appearance: Normal appearance.  HENT:     Head: Normocephalic and atraumatic.  Cardiovascular:     Rate and Rhythm: Normal rate and regular rhythm.     Pulses: Normal pulses.     Heart sounds: Normal heart sounds.  Pulmonary:     Effort: Pulmonary effort is normal.     Breath sounds: Normal breath sounds.  Musculoskeletal:        General: Tenderness and deformity present.     Right lower leg: Edema present.  Left lower leg: Edema present.  Skin:    Capillary Refill: Capillary refill takes 2 to 3 seconds.  Neurological:     Mental Status: He is alert and oriented to person, place, and time.     Cranial Nerves: No cranial nerve deficit.     Sensory: No sensory deficit.     Motor: Weakness present.     Gait: Gait abnormal.      No results found for any visits on 05/14/22.  Assessment & Plan     1. Decreased strength, endurance, and mobility Order for DME motorized scooter was provided to patient   Mr Nila is poorly ambulatory and requires a scooter for all mobility needs. He used a cane for walking but gets out breath after a short distance.  He has been having neuropathy in his  arms, especially right and has been having troubles to stay independent in all areas of transfers, weight shifts, self care, and activities of daily living. He has been having arthritic deformities in his hands and feet which effect his mobility. He has been staying with his wife who is bedridden currently and has to take care of her as well as himself. Lately, it has been very difficult to do. His bilateral hands/feet have limitations due to weakness   Mr Vipond  will need a new motorized scooter to help him with mobility in his home and in his community. He will be using the chair for approximately 8-10 hours per day. The chair will be used for positioning at home to allow for increased participation and safety during daily activities. In addition, the chair will be used to attend all necessary medical appointments.   FU as scheduled    The patient was advised to call back or seek an in-person evaluation if the symptoms worsen or if the condition fails to improve as anticipated.  I discussed the assessment and treatment plan with the patient. The patient was provided an opportunity to ask questions and all were answered. The patient agreed with the plan and demonstrated an understanding of the instructions.  The entirety of the information documented in the History of Present Illness, Review of Systems and Physical Exam were personally obtained by me. Portions of this information were initially documented by the CMA and reviewed by me for thoroughness and accuracy.  Portions of this note were created using dictation software and may contain typographical errors.        Total encounter time more than 30 minutes  Greater than 50% was spent in counseling and coordination of care with the patient  Elberta Leatherwood  [facility] [phone] (phone) [phone] (fax)  [place]

## 2022-05-16 ENCOUNTER — Encounter: Payer: Self-pay | Admitting: Physician Assistant

## 2022-05-21 ENCOUNTER — Telehealth: Payer: Self-pay

## 2022-05-21 NOTE — Telephone Encounter (Signed)
Patients daughter [name] wants to know what's the next step to getting a scooter? [name] says that patient was seen in the office on 05/14/2022 and was given some papers. [name] wants to know what they are supposed to do next. I advised [name] that she needs to give the DME company the order that was given for the scooter.

## 2022-05-25 NOTE — Telephone Encounter (Signed)
Patients daughter advised and verbalized understanding.  

## 2022-05-25 NOTE — Telephone Encounter (Signed)
Requesting a call back  Best contact: [phone]

## 2022-05-25 NOTE — Telephone Encounter (Signed)
Pt called and stated that insurance will not pay for scooter until paper work is done by office. Please advise

## 2022-05-25 NOTE — Telephone Encounter (Signed)
Patient's daughter [name] would like to get the scooter from [facility]. I called [facility]. Per Jon Billings, our office needs to fax over an order for the scooter along with clinical notes that support the need for the scooter and patient's demographic information to fax # [phone]. Once they receive this information they will get started.   Do you have a copy of the order that was given to the patient during the last office visit? Or can you re write an order for the scooter?

## 2022-05-25 NOTE — Telephone Encounter (Signed)
Pt's daughter called to provide DME [facility]  Phone: [phone] Fax: [phone]

## 2022-05-26 NOTE — Telephone Encounter (Addendum)
DME Order, demographic sheet and office note faxed to [facility]. Patient's daughter [name] advised.

## 2022-05-26 NOTE — Telephone Encounter (Signed)
Another order for Scooter is needed. Please see message below and advise when order is ready.

## 2022-06-01 NOTE — Telephone Encounter (Signed)
Patient's daughter [name] called stating that [employer] contacted patient today informing him that they have scooters, but they don't file them under insurance. Patient will have to pay out of pocket. [name] wants to know if you know of any companies that will file the scooter under patients insurance? I called [employer] and confirmed that this was true. Please advise.

## 2022-06-02 NOTE — Telephone Encounter (Signed)
[name] advised as below.

## 2022-06-30 ENCOUNTER — Other Ambulatory Visit: Payer: Self-pay | Admitting: Family Medicine

## 2022-06-30 DIAGNOSIS — N401 Enlarged prostate with lower urinary tract symptoms: Secondary | ICD-10-CM

## 2022-06-30 DIAGNOSIS — I10 Essential (primary) hypertension: Secondary | ICD-10-CM

## 2022-07-09 DIAGNOSIS — H401133 Primary open-angle glaucoma, bilateral, severe stage: Secondary | ICD-10-CM | POA: Diagnosis not present

## 2022-08-17 ENCOUNTER — Other Ambulatory Visit: Payer: Self-pay | Admitting: Family Medicine

## 2022-08-17 DIAGNOSIS — I10 Essential (primary) hypertension: Secondary | ICD-10-CM

## 2022-09-20 ENCOUNTER — Other Ambulatory Visit: Payer: Medicare HMO

## 2022-09-20 ENCOUNTER — Inpatient Hospital Stay: Payer: Medicare HMO | Attending: Internal Medicine

## 2022-09-20 DIAGNOSIS — D472 Monoclonal gammopathy: Secondary | ICD-10-CM | POA: Insufficient documentation

## 2022-09-20 DIAGNOSIS — J449 Chronic obstructive pulmonary disease, unspecified: Secondary | ICD-10-CM | POA: Diagnosis not present

## 2022-09-20 DIAGNOSIS — J986 Disorders of diaphragm: Secondary | ICD-10-CM | POA: Diagnosis not present

## 2022-09-20 LAB — BASIC METABOLIC PANEL
Anion gap: 4 — ABNORMAL LOW (ref 5–15)
BUN: 17 mg/dL (ref 8–23)
CO2: 32 mmol/L (ref 22–32)
Calcium: 9 mg/dL (ref 8.9–10.3)
Chloride: 97 mmol/L — ABNORMAL LOW (ref 98–111)
Creatinine, Ser: 1.17 mg/dL (ref 0.61–1.24)
GFR, Estimated: >60 mL/min (ref >60)
Glucose, Bld: 89 mg/dL (ref 70–99)
Potassium: 3.7 mmol/L (ref 3.5–5.1)
Sodium: 133 mmol/L — ABNORMAL LOW (ref 135–145)

## 2022-09-20 LAB — CBC WITH DIFFERENTIAL/PLATELET
Abs Immature Granulocytes: 0.05 10*3/uL (ref 0.00–0.07)
Basophils Absolute: 0 10*3/uL (ref 0.0–0.1)
Basophils Relative: 0 %
Eosinophils Absolute: 0.1 10*3/uL (ref 0.0–0.5)
Eosinophils Relative: 1 %
HCT: 41.4 % (ref 39.0–52.0)
Hemoglobin: 13.5 g/dL (ref 13.0–17.0)
Immature Granulocytes: 1 %
Lymphocytes Relative: 45 %
Lymphs Abs: 3.1 10*3/uL (ref 0.7–4.0)
MCH: 30.3 pg (ref 26.0–34.0)
MCHC: 32.6 g/dL (ref 30.0–36.0)
MCV: 92.8 fL (ref 80.0–100.0)
Monocytes Absolute: 0.7 10*3/uL (ref 0.1–1.0)
Monocytes Relative: 10 %
Neutro Abs: 2.9 10*3/uL (ref 1.7–7.7)
Neutrophils Relative %: 43 %
Platelets: 186 10*3/uL (ref 150–400)
RBC: 4.46 MIL/uL (ref 4.22–5.81)
RDW: 13.1 % (ref 11.5–15.5)
WBC: 6.8 10*3/uL (ref 4.0–10.5)
nRBC: 0 % (ref 0.0–0.2)

## 2022-09-21 ENCOUNTER — Inpatient Hospital Stay (HOSPITAL_BASED_OUTPATIENT_CLINIC_OR_DEPARTMENT_OTHER): Payer: Medicare HMO | Admitting: Internal Medicine

## 2022-09-21 ENCOUNTER — Telehealth: Payer: Medicare HMO | Admitting: Internal Medicine

## 2022-09-21 ENCOUNTER — Encounter: Payer: Self-pay | Admitting: Internal Medicine

## 2022-09-21 DIAGNOSIS — D472 Monoclonal gammopathy: Secondary | ICD-10-CM

## 2022-09-21 NOTE — Assessment & Plan Note (Addendum)
MGUS: Patient has been followed annually by Dr. Grayland Ormond for several years.  Labs from 09/11/2021 show a hemoglobin of 12.7, M spike 1.1 and a kappa lambda light chain ratio of 1.84.  This is very stable from previous.  He does not meet crab criteria.  He denies any bone pain.  No evidence of endorgan damage.  He does not require a bone marrow biopsy or metastatic bone survey at this time.  He is low risk for progressing to multiple myeloma.  Continue annual follow-up with labs a few days before.  # today- Hb 13; normal creatinine; normal calcium- no evidence of progression   # PN- 1-2.- s/p evaluation with Neurology- stable.   My chart-  # DISPOSITION: # follow up in 12 moths-1 weeks PRIOR labs- cbc/cmp;MM panel; K/l light chain- Dr. Jacinto Reap

## 2022-09-22 LAB — KAPPA/LAMBDA LIGHT CHAINS
Kappa free light chain: 25.6 mg/L — ABNORMAL HIGH (ref 3.3–19.4)
Kappa, lambda light chain ratio: 1.8 — ABNORMAL HIGH (ref 0.26–1.65)
Lambda free light chains: 14.2 mg/L (ref 5.7–26.3)

## 2022-09-22 LAB — IGG, IGA, IGM
IgA: 171 mg/dL (ref 61–437)
IgG (Immunoglobin G), Serum: 1728 mg/dL — ABNORMAL HIGH (ref 603–1613)
IgM (Immunoglobulin M), Srm: 60 mg/dL (ref 15–143)

## 2022-09-22 NOTE — Progress Notes (Signed)
I connected with Daniel Carr on 09/26/2023at  3:30 PM EDT by video enabled telemedicine visit and verified that I am speaking with the correct person using two identifiers.  I discussed the limitations, risks, security and privacy concerns of performing an evaluation and management service by telemedicine and the availability of in-person appointments. I also discussed with the patient that there may be a patient responsible charge related to this service. The patient expressed understanding and agreed to proceed.    Other persons participating in the visit and their role in the encounter: RN/medical reconciliation Patient's location: home Provider's location: office  Oncology History   No history exists.     Chief Complaint: MGUS    History of present illness:Daniel Carr 87 y.o.  male with history of MGUS is here for follow-up.  Patient denies any worsening bone pain or joint pains.  Denies any abdominal pain nausea vomiting.  He continues to complain of "poor circulation".  And also history of chronic neuropathy.  No falls.  Observation/objective: Alert & oriented x 3. In No acute distress.   Assessment and plan: MGUS (monoclonal gammopathy of unknown significance) # MGUS:  09/11/2021 show a hemoglobin of 12.7, M spike 1.1 and a kappa lambda light chain ratio of 1.84.  This is very stable from previous year from 2019.  Today- Hb 13; normal creatinine; normal calcium- no evidence of progression.  Patient does not meet crab criteria.  No evidence of any endorgan damage.  Kappa lambda light chain ratio multiple myeloma panel pending.  Low risk of progression to myeloma was discussed.  STABLE.  # PN vs PVD- 1-2.- s/p evaluation with Neurology- STABLE.   My chart-  # DISPOSITION: # follow up in 12 moths-1 weeks PRIOR labs- cbc/cmp;MM panel; K/l light chain- Dr. B     Follow-up instructions:  I discussed the assessment and treatment plan with the patient.  The patient was provided  an opportunity to ask questions and all were answered.  The patient agreed with the plan and demonstrated understanding of instructions.  The patient was advised to call back or seek an in person evaluation if the symptoms worsen or if the condition fails to improve as anticipated.    Dr. Charlaine Dalton [place] at [facility] 09/22/2022 7:49 AM

## 2022-09-23 LAB — PROTEIN ELECTROPHORESIS, SERUM
A/G Ratio: 1.2 (ref 0.7–1.7)
Albumin ELP: 3.8 g/dL (ref 2.9–4.4)
Alpha-1-Globulin: 0.2 g/dL (ref 0.0–0.4)
Alpha-2-Globulin: 0.5 g/dL (ref 0.4–1.0)
Beta Globulin: 0.7 g/dL (ref 0.7–1.3)
Gamma Globulin: 1.6 g/dL (ref 0.4–1.8)
Globulin, Total: 3.1 g/dL (ref 2.2–3.9)
M-Spike, %: 1.1 g/dL — ABNORMAL HIGH
Total Protein ELP: 6.9 g/dL (ref 6.0–8.5)

## 2022-10-12 DIAGNOSIS — J3089 Other allergic rhinitis: Secondary | ICD-10-CM | POA: Diagnosis not present

## 2022-10-12 DIAGNOSIS — J301 Allergic rhinitis due to pollen: Secondary | ICD-10-CM | POA: Diagnosis not present

## 2022-10-26 ENCOUNTER — Ambulatory Visit (INDEPENDENT_AMBULATORY_CARE_PROVIDER_SITE_OTHER): Payer: Medicare HMO | Admitting: Family Medicine

## 2022-10-26 ENCOUNTER — Encounter: Payer: Self-pay | Admitting: Family Medicine

## 2022-10-26 VITALS — BP 131/84 | HR 73 | Resp 16 | Ht 66.0 in | Wt 221.0 lb

## 2022-10-26 DIAGNOSIS — I10 Essential (primary) hypertension: Secondary | ICD-10-CM | POA: Diagnosis not present

## 2022-10-26 DIAGNOSIS — M25522 Pain in left elbow: Secondary | ICD-10-CM | POA: Diagnosis not present

## 2022-10-26 DIAGNOSIS — E7800 Pure hypercholesterolemia, unspecified: Secondary | ICD-10-CM | POA: Diagnosis not present

## 2022-10-26 DIAGNOSIS — Z125 Encounter for screening for malignant neoplasm of prostate: Secondary | ICD-10-CM

## 2022-10-26 DIAGNOSIS — N400 Enlarged prostate without lower urinary tract symptoms: Secondary | ICD-10-CM | POA: Diagnosis not present

## 2022-10-26 DIAGNOSIS — E039 Hypothyroidism, unspecified: Secondary | ICD-10-CM | POA: Diagnosis not present

## 2022-10-26 DIAGNOSIS — Z23 Encounter for immunization: Secondary | ICD-10-CM | POA: Diagnosis not present

## 2022-10-26 DIAGNOSIS — M25521 Pain in right elbow: Secondary | ICD-10-CM

## 2022-10-26 DIAGNOSIS — M778 Other enthesopathies, not elsewhere classified: Secondary | ICD-10-CM | POA: Diagnosis not present

## 2022-10-26 DIAGNOSIS — Z0000 Encounter for general adult medical examination without abnormal findings: Secondary | ICD-10-CM | POA: Diagnosis not present

## 2022-10-26 MED ORDER — NAPROXEN 500 MG PO TABS: 500.0000 mg | ORAL_TABLET | Freq: Two times a day (BID) | ORAL | 0 refills | Status: DC

## 2022-10-26 NOTE — Progress Notes (Signed)
I,Tiffany J Bragg,acting as a scribe for Lelon Huh, MD.,have documented all relevant documentation on the behalf of Lelon Huh, MD,as directed by  Lelon Huh, MD while in the presence of Lelon Huh, MD.   Complete Physical Exam      Patient: Daniel Carr, Male    DOB: 04/29/1935, 87 y.o.   MRN: [id number] Visit Date: 10/26/2022  Today's Provider: Lelon Huh, MD   Chief Complaint  Patient presents with   Annual Exam   Subjective    Daniel Carr is a 87 y.o. male who presents today for his complete physical examination. He reports consuming a general diet. The patient does not participate in regular exercise at present. He generally feels well. He reports sleeping well. He does have additional problems to discuss today.   His main complaint today is pain in his elbow, R>L which he describes as neuropathy. However he has now pain or tingling in his fingers or feet.   Medications: Outpatient Medications Prior to Visit  Medication Sig   Alpha-Lipoic Acid 100 MG CAPS Take by mouth.   amLODipine (NORVASC) 2.5 MG tablet Take 1 tablet by mouth once daily   aspirin 81 MG EC tablet Take 1 tablet (81 mg total) by mouth daily. Swallow whole.   azelastine (ASTELIN) 0.1 % nasal spray Place 1 spray into the nose daily as needed (allergies).   brimonidine (ALPHAGAN) 0.2 % ophthalmic solution Place 1 drop into both eyes in the morning and at bedtime.   clotrimazole-betamethasone (LOTRISONE) cream APPLY TOPICALLY ONCE DAILY   COD LIVER OIL PO Take 1 capsule by mouth daily.   dorzolamide (TRUSOPT) 2 % ophthalmic solution Apply to eye.   dutasteride (AVODART) 0.5 MG capsule Take 1 capsule by mouth once daily   enalapril-hydrochlorothiazide (VASERETIC) 10-25 MG tablet hydroCHLOROthiazide '25mg'$    finasteride (PROPECIA) 1 MG tablet Finasteride   hydrochlorothiazide (HYDRODIURIL) 25 MG tablet TAKE ONE-HALF TABLET BY MOUTH ONCE DAILY   ipratropium (ATROVENT) 0.06 % nasal spray Place  1 spray into both nostrils 2 (two) times daily as needed for rhinitis.   latanoprost (XALATAN) 0.005 % ophthalmic solution SMARTSIG:In Eye(s)   levocetirizine (XYZAL) 5 MG tablet Take 5 mg by mouth every evening.   levothyroxine (SYNTHROID) 150 MCG tablet Take 1 tablet (150 mcg total) by mouth daily.   lovastatin (MEVACOR) 40 MG tablet Take 1 tablet by mouth once daily   Multiple Vitamin (DAILY VITAMIN PO) Take 1 tablet by mouth daily.   traMADol (ULTRAM) 50 MG tablet Take 1 tablet (50 mg total) by mouth every 4 (four) hours as needed for moderate pain.   STIOLTO RESPIMAT 2.5-2.5 MCG/ACT AERS 2 puffs daily. (Patient not taking: Reported on 10/26/2022)   No facility-administered medications prior to visit.    No Known Allergies  Patient Care Team: [name], MD as PCP - General (Family Medicine) Dingeldein, Remo Lipps, MD as Consulting Physician (Ophthalmology)  Review of Systems  Constitutional:  Positive for fatigue.  HENT:  Positive for rhinorrhea.   Musculoskeletal:  Positive for arthralgias.        Objective    Vitals: BP 131/84 (BP Location: Right Arm, Patient Position: Sitting, Cuff Size: Large)   Pulse 73   Resp 16   Ht '5\' 6"'$  (1.676 m)   Wt 221 lb (100.2 kg)   SpO2 100%   BMI 35.67 kg/m     Physical Exam  General Appearance:    Mildly obese male. Alert, cooperative, in no acute distress, appears stated age  Head:    Normocephalic, without obvious abnormality, atraumatic  Eyes:    PERRL, conjunctiva/corneas clear, EOM's intact, fundi    benign, both eyes       Ears:    Normal TM's and external ear canals, both ears  Nose:   Nares normal, septum midline, mucosa normal, no drainage   or sinus tenderness  Throat:   Lips, mucosa, and tongue normal; teeth and gums normal  Neck:   Supple, symmetrical, trachea midline, no adenopathy;       thyroid:  No enlargement/tenderness/nodules; no carotid   bruit or JVD  Back:     Symmetric, no curvature, ROM normal, no CVA  tenderness  Lungs:     Clear to auscultation bilaterally, respirations unlabored  Chest wall:    No tenderness or deformity  Heart:    Normal heart rate. Normal rhythm.  2/6 systolic murmur S1 and S2 normal  Abdomen:     Soft, non-tender, bowel sounds active all four quadrants,    no masses, no organomegaly  Genitalia:    deferred  Rectal:    deferred  Extremities:   All extremities are intact. No cyanosis or edema. Tender around tendon insertions of left elbow, pain reproduced by elbow extension and flexion against resistance.   Pulses:   2+ and symmetric all extremities  Skin:   Skin color, texture, turgor normal, no rashes or lesions  Lymph nodes:   Cervical, supraclavicular, and axillary nodes normal  Neurologic:   CNII-XII intact. Normal strength, sensation and reflexes      throughout      Assessment & Plan    1. Annual physical exam   2. Hypertension, benign Well controlled.  Continue current medications.   - EKG 12-Lead  3. Hypothyroidism, unspecified type  - TSH - T4, free - Magnesium  4. Pure hypercholesterolemia He is tolerating lovastatin well with no adverse effects.   - CBC - Comprehensive metabolic panel - Lipid panel  5. Pain of both elbows Likely tendonitis.   6. Bilateral elbow tendonitis Try naproxen '500mg'$  twice daily for 14 days. Call for orthopedic referral if not much better when finished.   7. Need for influenza vaccination  - Flu Vaccine QUAD High Dose(Fluad)  8. Prostate cancer screening  - PSA Total (Reflex To Free) ([facility] only)    The entirety of the information documented in the History of Present Illness, Review of Systems and Physical Exam were personally obtained by me. Portions of this information were initially documented by the CMA and reviewed by me for thoroughness and accuracy.     Lelon Huh, MD  [facility] [phone] (phone) [phone] (fax)  [place]

## 2022-10-26 NOTE — Patient Instructions (Addendum)
Please review the attached list of medications and notify my office if there are any errors.   Try the prescription for naprosyn twice a day every day for 2 weeks to help with your elbow pain. If it doesn't help, if pain comes back after finishing the prescription then call back for an orthopedic referral  You can try taking Vitamin B12 1,000 mg once a day and Vitamin D3 1,000 units once a day to help with nerve pains

## 2022-10-26 NOTE — Progress Notes (Signed)
Annual Wellness Visit     Patient: Daniel Carr, Male    DOB: [birth year], 87 y.o.   MRN: [id number] Visit Date: 10/26/2022  Today's Provider: Lelon Huh, MD    Subjective    Daniel Carr is a 87 y.o. male who presents today for his Annual Wellness Visit.  Medications: Outpatient Medications Prior to Visit  Medication Sig   Alpha-Lipoic Acid 100 MG CAPS Take by mouth.   amLODipine (NORVASC) 2.5 MG tablet Take 1 tablet by mouth once daily   aspirin 81 MG EC tablet Take 1 tablet (81 mg total) by mouth daily. Swallow whole.   azelastine (ASTELIN) 0.1 % nasal spray Place 1 spray into the nose daily as needed (allergies).   brimonidine (ALPHAGAN) 0.2 % ophthalmic solution Place 1 drop into both eyes in the morning and at bedtime.   clotrimazole-betamethasone (LOTRISONE) cream APPLY TOPICALLY ONCE DAILY   COD LIVER OIL PO Take 1 capsule by mouth daily.   dorzolamide (TRUSOPT) 2 % ophthalmic solution Apply to eye.   dutasteride (AVODART) 0.5 MG capsule Take 1 capsule by mouth once daily   enalapril-hydrochlorothiazide (VASERETIC) 10-25 MG tablet hydroCHLOROthiazide '25mg'$    finasteride (PROPECIA) 1 MG tablet Finasteride   hydrochlorothiazide (HYDRODIURIL) 25 MG tablet TAKE ONE-HALF TABLET BY MOUTH ONCE DAILY   ipratropium (ATROVENT) 0.06 % nasal spray Place 1 spray into both nostrils 2 (two) times daily as needed for rhinitis.   latanoprost (XALATAN) 0.005 % ophthalmic solution SMARTSIG:In Eye(s)   levocetirizine (XYZAL) 5 MG tablet Take 5 mg by mouth every evening.   levothyroxine (SYNTHROID) 150 MCG tablet Take 1 tablet (150 mcg total) by mouth daily.   lovastatin (MEVACOR) 40 MG tablet Take 1 tablet by mouth once daily   Multiple Vitamin (DAILY VITAMIN PO) Take 1 tablet by mouth daily.   traMADol (ULTRAM) 50 MG tablet Take 1 tablet (50 mg total) by mouth every 4 (four) hours as needed for moderate pain.   [DISCONTINUED] lovastatin (MEVACOR) 20 MG tablet Lovastatin '20mg'$     [DISCONTINUED] SYNTHROID 125 MCG tablet Take 1 tablet by mouth once daily   STIOLTO RESPIMAT 2.5-2.5 MCG/ACT AERS 2 puffs daily. (Patient not taking: Reported on 10/26/2022)   No facility-administered medications prior to visit.    No Known Allergies  Patient Care Team: [name], MD as PCP - General (Family Medicine) Dingeldein, Remo Lipps, MD as Consulting Physician (Ophthalmology)  Objective     Most recent functional status assessment:    10/26/2022    9:06 AM  In your present state of health, do you have any difficulty performing the following activities:  Hearing? 0  Vision? 1  Difficulty concentrating or making decisions? 0  Walking or climbing stairs? 1  Dressing or bathing? 0  Doing errands, shopping? 0   Most recent fall risk assessment:    10/26/2022    9:06 AM  [place] in the past year? 0  Number falls in past yr: 0  Injury with Fall? 0  Risk for fall due to : No Fall Risks  Follow up Falls evaluation completed    Most recent depression screenings:    10/26/2022    9:06 AM 10/21/2021   10:29 AM  PHQ 2/9 Scores  PHQ - 2 Score 0 0  PHQ- 9 Score 3 1   Most recent cognitive screening:    10/21/2021   10:18 AM  6CIT Screen  What Year? 0 points  What month? 0 points  What time?  0 points  Count back from 20 0 points  Months in reverse 0 points  Repeat phrase 0 points  Total Score 0 points   Most recent Audit-C alcohol use screening    10/26/2022    9:06 AM  Alcohol Use Disorder Test (AUDIT)  1. How often do you have a drink containing alcohol? 0  2. How many drinks containing alcohol do you have on a typical day when you are drinking? 0  3. How often do you have six or more drinks on one occasion? 0  AUDIT-C Score 0   A score of 3 or more in women, and 4 or more in men indicates increased risk for alcohol abuse, EXCEPT if all of the points are from question 1   No results found for any visits on 10/26/22.  Assessment & Plan      Annual wellness visit done today including the all of the following: Reviewed patient's Family Medical History Reviewed and updated list of patient's medical providers Assessment of cognitive impairment was done Assessed patient's functional ability Established a written schedule for health screening [place] Completed and Reviewed  Exercise Activities and Dietary recommendations  Goals      Chronic Disease Management     CARE PLAN ENTRY (see longitudinal plan of care for additional care plan information)  Current Barriers:  Chronic Disease Management support and education needs related to Hypertension, Chronic Bronchitis, Hypercholesterolemia and Left Knee Osteoarthritis.  Case Manager Clinical Goal(s):  Over the next 120 days, patient: Will not require hospitalization or emergent care d/t complications r/t chronic illnesses.  Will attend all scheduled medical appointments. Will take all medications as prescribed. Will monitor blood pressure and record readings. Will follow safety recommendations to prevents falls and injuries. Over the next 60 days, patient will: Follow up with [facility] regarding right foot. Update the chronic care management team with needs r/t pending knee surgery.   Interventions:  Inter-disciplinary care team collaboration (see longitudinal plan of care)  Reviewed current care management needs with caregiver/daughter [name]. Daniel Carr will start outpatient physical  therapy on 01/14/21 and will require transportation. Per Ortho team, the plan is to complete therapy 2-3 times/week. Referral placed for transportation assistance. We discussed possible in-home needs d/t Daniel Carr limited mobility. [name] confirmed that the family is able to support a private pay sitter. Anticipates the sitter being available to assist later this week. Also anticipates her daughter being available to provide occasional assistance. Denies  other urgent concerns. [name] agreed to keep the care management team updated of Daniel Carr needs. She is aware of the pending outreach with the [place] to confirm transportation for his upcoming appointments.      Patient Self Care Activities:  Self administers medications Attends scheduled provider appointments Performs ADL's independently Calls provider office for new concerns or questions   Please see past updates related to this goal by clicking on the "Past Updates" button in the selected goal       Increase water intake     Recommend increasing water intake to 4-6 glasses a day.         Immunization History  Administered Date(s) Administered   Fluad Quad(high Dose 65+) 10/23/2019, 10/01/2020, 10/21/2021, 10/26/2022   Influenza, High Dose Seasonal PF 10/07/2015, 10/16/2016, 09/02/2017, 09/19/2018   Influenza-Unspecified 09/19/2014   PFIZER(Purple Top)SARS-COV-2 Vaccination 01/16/2020, 02/09/2020, 09/30/2020   Pneumococcal Conjugate-13 06/03/2014   Pneumococcal Polysaccharide-23 04/16/2003, 08/19/2016, 07/28/2017, 11/08/2019, 09/30/2020   Td  03/04/1997   Tdap 11/09/2011   Zoster, Live 05/09/2008, 06/03/2014    Health Maintenance  Topic Date Due   Zoster Vaccines- Shingrix (1 of 2) Never done   COVID-19 Vaccine (4 - Pfizer risk series) 11/25/2020   TETANUS/TDAP  11/08/2021   Medicare Annual Wellness (AWV)  10/21/2022   Pneumonia Vaccine 69+ Years old  Completed   INFLUENZA VACCINE  Completed   HPV VACCINES  Aged Out     Discussed health benefits of physical activity, and encouraged him to engage in regular exercise appropriate for his age and condition.           Lelon Huh, MD  [facility] [phone] (phone) [phone] (fax)  [place]

## 2022-10-27 LAB — COMPREHENSIVE METABOLIC PANEL
ALT: 12 IU/L (ref 0–44)
AST: 27 IU/L (ref 0–40)
Albumin/Globulin Ratio: 1.5 (ref 1.2–2.2)
Albumin: 4.3 g/dL (ref 3.7–4.7)
Alkaline Phosphatase: 42 IU/L — ABNORMAL LOW (ref 44–121)
BUN/Creatinine Ratio: 10 (ref 10–24)
BUN: 12 mg/dL (ref 8–27)
Bilirubin Total: 0.7 mg/dL (ref 0.0–1.2)
CO2: 29 mmol/L (ref 20–29)
Calcium: 9.4 mg/dL (ref 8.6–10.2)
Chloride: 94 mmol/L — ABNORMAL LOW (ref 96–106)
Creatinine, Ser: 1.17 mg/dL (ref 0.76–1.27)
Globulin, Total: 2.8 g/dL (ref 1.5–4.5)
Glucose: 90 mg/dL (ref 70–99)
Potassium: 4 mmol/L (ref 3.5–5.2)
Sodium: 134 mmol/L (ref 134–144)
Total Protein: 7.1 g/dL (ref 6.0–8.5)
eGFR: 61 mL/min/{1.73_m2} (ref >59)

## 2022-10-27 LAB — CBC
Hematocrit: 37.2 % — ABNORMAL LOW (ref 37.5–51.0)
Hemoglobin: 13.1 g/dL (ref 13.0–17.7)
MCH: 32 pg (ref 26.6–33.0)
MCHC: 35.2 g/dL (ref 31.5–35.7)
MCV: 91 fL (ref 79–97)
Platelets: 205 10*3/uL (ref 150–450)
RBC: 4.09 x10E6/uL — ABNORMAL LOW (ref 4.14–5.80)
RDW: 12 % (ref 11.6–15.4)
WBC: 6.5 10*3/uL (ref 3.4–10.8)

## 2022-10-27 LAB — FPSA% REFLEX
% FREE PSA: 18.8 %
PSA, FREE: 0.92 ng/mL

## 2022-10-27 LAB — LIPID PANEL
Chol/HDL Ratio: 1.9 ratio (ref 0.0–5.0)
Cholesterol, Total: 172 mg/dL (ref 100–199)
HDL: 90 mg/dL (ref >39)
LDL Chol Calc (NIH): 71 mg/dL (ref 0–99)
Triglycerides: 57 mg/dL (ref 0–149)
VLDL Cholesterol Cal: 11 mg/dL (ref 5–40)

## 2022-10-27 LAB — T4, FREE: Free T4: 2.23 ng/dL — ABNORMAL HIGH (ref 0.82–1.77)

## 2022-10-27 LAB — TSH: TSH: 1.26 u[IU]/mL (ref 0.450–4.500)

## 2022-10-27 LAB — PSA TOTAL (REFLEX TO FREE): Prostate Specific Ag, Serum: 4.9 ng/mL — ABNORMAL HIGH (ref 0.0–4.0)

## 2022-10-27 LAB — MAGNESIUM: Magnesium: 2.2 mg/dL (ref 1.6–2.3)

## 2022-10-29 DIAGNOSIS — H401133 Primary open-angle glaucoma, bilateral, severe stage: Secondary | ICD-10-CM | POA: Diagnosis not present

## 2022-11-12 ENCOUNTER — Ambulatory Visit: Payer: Self-pay | Admitting: *Deleted

## 2022-11-12 DIAGNOSIS — M25522 Pain in left elbow: Secondary | ICD-10-CM

## 2022-11-12 MED ORDER — NAPROXEN 500 MG PO TABS: 500.0000 mg | ORAL_TABLET | Freq: Two times a day (BID) | ORAL | 1 refills | Status: DC

## 2022-11-12 NOTE — Telephone Encounter (Signed)
Summary: mild pain in right elbow   Patient called in wanting to let Dr Caryn Section that the med he took helped, but he is still having mild pain in in right elbow and didn't know if Dr Caryn Section wanted to do a referral. He had an appt already with Dr Caryn Section 2 weeks ago.       Chief Complaint: mild right elbow pain / arm pain better after taking naproxen. Requesting if referral needed and requesting latest lab results from 10/26/22 Symptoms: right elbow / arm pain mild better. Still feels pain but not as bad. Finished course of naproxen 500 mg twice daily.  Frequency: na  Pertinent Negatives: Patient denies N/T in arm.  Disposition: '[]'$ ED /'[]'$ Urgent Care (no appt availability in office) / '[]'$ Appointment(In office/virtual)/ '[]'$  [facility]/ '[]'$ Home Care/ '[]'$ Refused Recommended Disposition /'[]'$ [facility]/ '[x]'$  Follow-up with PCP Additional Notes:   Requesting if PCP would like to do referral for right elbow / arm pain or give any additional medication if worsening pain comes back. No documentation noted from PCP regarding labs from 10/26/22. Please advise .      Reason for Disposition  [1] MILD pain (e.g., does not interfere with normal activities) AND [2] present > 7 days  Answer Assessment - Initial Assessment Questions 1. ONSET: "When did the pain start?"     Has been seen by PCP 2. LOCATION: "Where is the pain located?"     Left arm  3. PAIN: "How bad is the pain?" (Scale 1-10; or mild, moderate, severe)   - MILD (1-3): Doesn't interfere with normal activities.   - MODERATE (4-7): Interferes with normal activities (e.g., work or school) or awakens from sleep.   - SEVERE (8-10): Excruciating pain, unable to do any normal activities, unable to hold a cup of water.     Pain level better after taking naproxen 500 mg twice daily  4. WORK OR EXERCISE: "Has there been any recent work or exercise that involved this part of the body?"     na 5. CAUSE: "What do you think is causing  the arm pain?"     Not sure  6. OTHER SYMPTOMS: "Do you have any other symptoms?" (e.g., neck pain, swelling, rash, fever, numbness, weakness)     Denies  7. PREGNANCY: "Is there any chance you are pregnant?" "When was your last menstrual period?"     na  Protocols used: Arm Pain-A-AH

## 2022-11-12 NOTE — Telephone Encounter (Signed)
Please review and advise.

## 2022-11-12 NOTE — Telephone Encounter (Signed)
Patient advised and agrees to treatment plan. Please schedule referral.

## 2022-11-12 NOTE — Telephone Encounter (Signed)
If the naprosyn helped then I would recommend staying on that for a few more weeks and get in for physical therapy. Can send in prescription and sign of on OT referral if he is agreeable.

## 2022-11-16 DIAGNOSIS — H401133 Primary open-angle glaucoma, bilateral, severe stage: Secondary | ICD-10-CM | POA: Diagnosis not present

## 2022-11-22 ENCOUNTER — Ambulatory Visit: Payer: Medicare HMO | Attending: Family Medicine

## 2022-11-22 ENCOUNTER — Other Ambulatory Visit: Payer: Self-pay | Admitting: Family Medicine

## 2022-11-22 DIAGNOSIS — M25522 Pain in left elbow: Secondary | ICD-10-CM | POA: Diagnosis not present

## 2022-11-22 DIAGNOSIS — M6281 Muscle weakness (generalized): Secondary | ICD-10-CM | POA: Insufficient documentation

## 2022-11-22 DIAGNOSIS — I10 Essential (primary) hypertension: Secondary | ICD-10-CM

## 2022-11-22 DIAGNOSIS — M25521 Pain in right elbow: Secondary | ICD-10-CM | POA: Insufficient documentation

## 2022-11-22 NOTE — Progress Notes (Incomplete)
OUTPATIENT OCCUPATIONAL THERAPY ORTHO EVALUATION  Patient Name: Daniel Carr MRN: [id number] DOB:27-Dec-1935, 87 y.o., male Today's Date: 11/22/2022  PCP: Dr. [name] REFERRING PROVIDER: PCP  END OF SESSION:   Past Medical History:  Diagnosis Date   Arthritis    childhood infection    chicken pox, measels, and mumps   Hypertension    Hypothyroidism    Sinus problem    Ulnar neuropathy    bilateral   Past Surgical History:  Procedure Laterality Date   APPENDECTOMY     CATARACT EXTRACTION Bilateral    COLONOSCOPY     JOINT REPLACEMENT Right    KNEE ARTHROPLASTY Left 12/24/2020   Procedure: COMPUTER ASSISTED TOTAL KNEE ARTHROPLASTY;  Surgeon: Dereck Leep, MD;  Location: [facility] ORS;  Service: Orthopedics;  Laterality: Left;  3RD CASE   KNEE SURGERY Right 10/04/2011   replacement   NM MYOVIEW ([facility] HX)  07/21/2017   KC. No ischemia.    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   Pulmonary embolism ([place]) 01/14/2021   Total knee replacement status 65/99/3570   Systolic murmur 17/79/3903   Allergic rhinitis 02/19/2019   MGUS (monoclonal gammopathy of unknown significance) 00/92/3300   Systolic dysfunction 76/22/6333   Primary osteoarthritis of left knee 07/28/2017   Callus of foot 07/27/2016   Nail dystrophy 07/27/2016   Arthropathia 06/09/2015   Neuropathic ulnar nerve 06/09/2015   Glaucoma    Hypothyroid    Pure hypercholesterolemia    Hypertension, benign    Arthritis, multiple joint involvement    BPH (benign prostatic hyperplasia)    ED (erectile dysfunction)    Nocturia associated with benign prostatic hypertrophy     ONSET DATE: ***  REFERRING DIAG: pain in both elbows  THERAPY DIAG:  No diagnosis found.  Rationale for Evaluation and Treatment: Rehabilitation  SUBJECTIVE:   SUBJECTIVE STATEMENT: *** Pt accompanied by:  {accompnied:27141}  PERTINENT HISTORY: Per chart: Bilateral elbow tendonitis On 10/26/22 pt seen by PCP and was given naproxen '500mg'$  twice daily for 14 days. Plan was to call for orthopedic referral if not much better when finished.  After 2 weeks, pain was improved but still present.  PRECAUTIONS: Other: cumulative trauma precautions (avoid repetitive activity)   WEIGHT BEARING RESTRICTIONS: No  PAIN:  Are you having pain? Yes: NPRS scale: ***/10 Pain location: *** Pain description: *** Aggravating factors: *** Relieving factors: ***  FALLS: Has patient fallen in last 6 months? {fallsyesno:27318}  LIVING ENVIRONMENT: Lives with: {OPRC lives with:25569::"lives with their family"} Lives in: {Lives in:25570} Stairs: {opstairs:27293} Has following equipment at home: {Assistive devices:23999}  PLOF: {PLOF:24004}  PATIENT GOALS: ***  NEXT MD VISIT:   OBJECTIVE:   HAND DOMINANCE: {MISC; OT HAND DOMINANCE:[phone]}  ADLs: Overall ADLs: *** Transfers/ambulation related to ADLs: Eating: *** Grooming: *** UB Dressing: *** LB Dressing: *** Toileting: *** Bathing: *** Tub Shower transfers: *** Equipment: {equipment:25573}  FUNCTIONAL OUTCOME MEASURES: {OTFUNCTIONALMEASURES:27238}  UPPER EXTREMITY ROM:     {AROM/PROM:27142} ROM Right eval Left eval  Shoulder flexion    Shoulder abduction    Shoulder adduction    Shoulder extension    Shoulder internal rotation    Shoulder external rotation    Elbow flexion    Elbow extension    Wrist flexion    Wrist extension    Wrist ulnar deviation    Wrist radial deviation  Wrist pronation    Wrist supination    (Blank rows = not tested)  {AROM/PROM:27142} ROM Right eval Left eval  Thumb MCP (0-60)    Thumb IP (0-80)    Thumb Radial abd/add (0-55)     Thumb Palmar abd/add (0-45)     Thumb Opposition to Small Finger     Index MCP (0-90)     Index PIP (0-100)     Index DIP (0-70)      Long MCP (0-90)       Long PIP (0-100)      Long DIP (0-70)      Ring MCP (0-90)      Ring PIP (0-100)      Ring DIP (0-70)      Little MCP (0-90)      Little PIP (0-100)      Little DIP (0-70)      (Blank rows = not tested)   UPPER EXTREMITY MMT:     MMT Right eval Left eval  Shoulder flexion    Shoulder abduction    Shoulder adduction    Shoulder extension    Shoulder internal rotation    Shoulder external rotation    Middle trapezius    Lower trapezius    Elbow flexion    Elbow extension    Wrist flexion    Wrist extension    Wrist ulnar deviation    Wrist radial deviation    Wrist pronation    Wrist supination    (Blank rows = not tested)  HAND FUNCTION: Grip strength: Right: *** lbs; Left: *** lbs, Lateral pinch: Right: *** lbs, Left: *** lbs, and 3 point pinch: Right: *** lbs, Left: *** lbs  COORDINATION: {otcoordination:27237}  SENSATION: {sensation:27233}  EDEMA: ***  COGNITION: Overall cognitive status: {cognition:24006} Areas of impairment: {impairedcognition:27234}  OBSERVATIONS: ***   TODAY'S TREATMENT:                                                                                                                              DATE: 11/22/22 Evaluation completed.  Objective measures taken and goals established.    PATIENT EDUCATION: Education details: OT role, goals, poc Person educated: Patient Education method: Explanation Education comprehension: verbalized understanding  HOME EXERCISE PROGRAM: To be established over the next 1-2 sessions  GOALS: Goals reviewed with patient? Yes  SHORT TERM GOALS: Target date: ***  Pt will be indep with HEP for improving strength and flexibility in BUEs. Baseline: Not yet established Goal status: INITIAL  2.  Pt will be indep to report 2-3 joint protection strategies to reduce future cumulative trauma symptoms in BUEs.   Baseline: Initiated educ on joint protections strategies at eval; additional training  needed Goal status: INITIAL  3.  *** Baseline:  Goal status: {GOALSTATUS:25110}  4.  *** Baseline:  Goal status: {GOALSTATUS:25110}  5.  *** Baseline:  Goal status: {GOALSTATUS:25110}  6.  *** Baseline:  Goal status: {GOALSTATUS:25110}  LONG  TERM GOALS: Target date: ***  Pt will increase FOTO score by to indicate clinically relevant improvement in daily functioning with ADLs/IADLs.  Baseline:  Goal status: INITIAL  2.  *** Baseline:  Goal status: {GOALSTATUS:25110}  3.  *** Baseline:  Goal status: {GOALSTATUS:25110}  4.  *** Baseline:  Goal status: {GOALSTATUS:25110}  5.  *** Baseline:  Goal status: {GOALSTATUS:25110}  6.  *** Baseline:  Goal status: {GOALSTATUS:25110}  ASSESSMENT:  CLINICAL IMPRESSION: Patient is a *** y.o. *** who was seen today for occupational therapy evaluation for ***.   PERFORMANCE DEFICITS: in functional skills including {OT physical skills:25468}, cognitive skills including {OT cognitive skills:25469}, and psychosocial skills including {OT psychosocial skills:25470}.   IMPAIRMENTS: are limiting patient from {OT performance deficits:25471}.   COMORBIDITIES: {Comorbidities:25485} that affects occupational performance. Patient will benefit from skilled OT to address above impairments and improve overall function.  MODIFICATION OR ASSISTANCE TO COMPLETE EVALUATION: {OT modification:25474}  OT OCCUPATIONAL PROFILE AND HISTORY: {OT PROFILE AND HISTORY:25484}  CLINICAL DECISION MAKING: {OT CDM:25475}  REHAB POTENTIAL: {rehabpotential:25112}  EVALUATION COMPLEXITY: {Evaluation complexity:25115}      PLAN:  OT FREQUENCY: {rehab frequency:25116}  OT DURATION: {rehab duration:25117}  PLANNED INTERVENTIONS: {OT Interventions:25467}  RECOMMENDED OTHER SERVICES: ***  CONSULTED AND AGREED WITH PLAN OF CARE: {TDD:22025}  PLAN FOR NEXT SESSION: see plan  Leta Speller, MS, OTR/L  Darleene Cleaver, OT 11/22/2022, 11:07  AM

## 2022-11-22 NOTE — Therapy (Unsigned)
OUTPATIENT OCCUPATIONAL THERAPY ORTHO EVALUATION  Patient Name: Daniel Carr MRN: [id number] DOB:1935/11/11, 87 y.o., male Today's Date: 11/23/2022  PCP: Dr. [name] REFERRING PROVIDER: Dr. [name]  END OF SESSION:  OT End of Session - 11/23/22 1622     Visit Number 1    Number of Visits 12    Date for OT Re-Evaluation 01/03/23    OT Start Time 1430    OT Stop Time 1525    OT Time Calculation (min) 55 min    Activity Tolerance Patient tolerated treatment well    Behavior During Therapy WFL for tasks assessed/performed             Past Medical History:  Diagnosis Date   Arthritis    childhood infection    chicken pox, measels, and mumps   Hypertension    Hypothyroidism    Sinus problem    Ulnar neuropathy    bilateral   Past Surgical History:  Procedure Laterality Date   APPENDECTOMY     CATARACT EXTRACTION Bilateral    COLONOSCOPY     JOINT REPLACEMENT Right    KNEE ARTHROPLASTY Left 12/24/2020   Procedure: COMPUTER ASSISTED TOTAL KNEE ARTHROPLASTY;  Surgeon: Dereck Leep, MD;  Location: [facility] ORS;  Service: Orthopedics;  Laterality: Left;  3RD CASE   KNEE SURGERY Right 10/04/2011   replacement   NM MYOVIEW ([facility] HX)  07/21/2017   KC. No ischemia.    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   Pulmonary embolism ([place]) 01/14/2021   Total knee replacement status 60/73/7106   Systolic murmur 26/94/8546   Allergic rhinitis 02/19/2019   MGUS (monoclonal gammopathy of unknown significance) 27/02/5008   Systolic dysfunction 38/18/2993   Primary osteoarthritis of left knee 07/28/2017   Callus of foot 07/27/2016   Nail dystrophy 07/27/2016   Arthropathia 06/09/2015   Neuropathic ulnar nerve 06/09/2015   Glaucoma    Hypothyroid    Pure hypercholesterolemia    Hypertension, benign    Arthritis, multiple joint involvement    BPH  (benign prostatic hyperplasia)    ED (erectile dysfunction)    Nocturia associated with benign prostatic hypertrophy     ONSET DATE: >6 months  REFERRING DIAG: Pain in both elbows  THERAPY DIAG:  Pain in left elbow  Pain in right elbow  Muscle weakness (generalized)  Rationale for Evaluation and Treatment: Rehabilitation  SUBJECTIVE:   SUBJECTIVE STATEMENT: "My R elbow hurts worse than my L." Pt accompanied by: family member, daughter Clarene Critchley)  PERTINENT HISTORY: Per chart: Bilateral elbow tendonitis On 10/26/22 pt seen by PCP and was given naproxen '500mg'$  twice daily for 14 days. Plan was to call for orthopedic referral if not much better when finished.  After 2 weeks, pain was improved but still present.  PRECAUTIONS: None  WEIGHT BEARING RESTRICTIONS: No  PAIN:  Are you having pain? Yes: NPRS scale: 2-10/10 Pain location: bilat elbows  Pain description: sharp  Aggravating factors: pt and daughter report no activities change the way pain comes and goes  Relieving factors: Naproxen,  heat  FALLS: Has patient fallen in last 6 months? No  LIVING ENVIRONMENT: Lives with:  spouse who is bedridden, but pt has 24 hr aids who manage  all IADLs for spouse; daughter also helps often and assists with transportation  Lives in: House/apartment Stairs: No Has following equipment at  home: Single point cane  PLOF: Independent with basic ADLs  PATIENT GOALS: "Get rid of this pain."  NEXT MD VISIT: nothing scheduled  OBJECTIVE:   HAND DOMINANCE: Right  ADLs: Overall ADLs: modified indep with basic ADLs using SPC; pain in elbows fluctuates with activity Transfers/ambulation related to ADLs: modified indep with SPC  IADLs: aids present in the home 24/7 d/t spouse being bedridden.  Pt's daughter, Clarene Critchley lives 4 doors down and is also over often to assist as needed.  All IADLs are managed between aids and daughter.  FUNCTIONAL OUTCOME MEASURES: FOTO: 47, predicted 62    UPPER EXTREMITY ROM:     Active ROM Right eval Left eval  Shoulder flexion    Shoulder abduction    Shoulder adduction    Shoulder extension    Shoulder internal rotation    Shoulder external rotation    Elbow flexion 137 135  Elbow extension -23 -25  Wrist flexion 78 90  Wrist extension 68 70  Wrist ulnar deviation    Wrist radial deviation    Wrist pronation 90 90  Wrist supination 78 80  (Blank rows = not tested)   UPPER EXTREMITY MMT:     MMT Right eval Left eval  Shoulder flexion    Shoulder abduction    Shoulder adduction    Shoulder extension    Shoulder internal rotation    Shoulder external rotation    Middle trapezius    Lower trapezius    Elbow flexion 4+ 4+  Elbow extension 3+  (limited by pain) 4  Wrist flexion 4+   Wrist extension 5   Wrist ulnar deviation    Wrist radial deviation    Wrist pronation 90   Wrist supination 78   (Blank rows = not tested)  HAND FUNCTION: Grip strength: Right: 34 lbs; Left: 26 lbs, Lateral pinch: Right: 13 lbs, Left: 9 lbs, and 3 point pinch: Right: 13 lbs, Left: 10 lbs  COORDINATION: Limited d/t severe arthritic deformities; see below in observations.    SENSATION: Pt reports hx of neuropathy in bilat hands and feet.  EDEMA: No visible edema, only arthritic joint deformities in elbows and hands bilaterally  COGNITION: Overall cognitive status: Within functional limits for tasks assessed  OBSERVATIONS: Pt has severe arthritic deformities in bilat hands, including severe ulnar drift in digits 2-5 of both hands, R hand flexion contractures in the MCP joints of digits 2-5, swan neck deformity in the R 5th digit, and swan neck deformities in the L hand digits 3-5.    TODAY'S TREATMENT:                                                                                                                              DATE: 11/22/22 Evaluation completed.  Objective measures taken and goals established.   PATIENT  EDUCATION: Education details: OT role, goals, poc Person educated: Patient and Child(ren) Education method: Explanation Education comprehension: verbalized understanding and needs  further education  HOME EXERCISE PROGRAM: To be established in the next 1-2 visits  GOALS: Goals reviewed with patient? Yes  SHORT TERM GOALS: Target date: 12/20/22 (4 weeks)  Pt will be indep with HEP for improving BUE strength and flexibility. Baseline: No yet established Goal status: INITIAL  2.  Pt will be indep to verbalize 2-3 joint protection strategies for BUEs to minimize pain and reduce additional cumulative trauma injury. Baseline: Educ not yet provided Goal status: INITIAL  LONG TERM GOALS: Target date: 01/03/23 (6 weeks)  Pt will increase FOTO score to 55 or better to demonstrate clinically relevant improvement to performance of daily tasks.  Baseline: 47 (predicted 62) Goal status: INITIAL  2.  Pt will tolerate modalities, manual therapy, and therapeutic exercises to minimize pain in BUEs, consistently reporting <4/10 pain in BUEs with activity. Baseline: Pt reports large fluctuation in pain, ranging from 2-10/10 pain with activity in bilat elbows. Goal status: INITIAL  3.  Pt will increase R/L elbow extension strength by 1/2 grade or more to more easily push self up from arm rests of a chair. Baseline: R elbow ext 3+, L 4 Goal status: INITIAL  4.  Pt will be indep to open jars in kitchen with adapted equipment as needed.  Baseline: Unable to open jars Goal status: INITIAL  ASSESSMENT:  CLINICAL IMPRESSION: Patient is an 87 y.o. male who was seen today for occupational therapy evaluation for pain in bilat elbows.  R medial elbow tender to palpate and pt reports pain is worse in the R than the L.  L elbow only tender in the volar elbow crease, epicondyles medial and lateral not provoked with palpation.  Pt presents with decreased tricep strength, and decreased grip bilaterally, but  likely more due to severe arthritic deformities in bilat hands.  Pt will benefit from skilled OT to address bilat elbow pain with educ on cumulative trauma injury prevention, joint protection strategies, instruction in HEP, manual therapy to provide STM, and iontophoresis with Dexamethasone in order to reduce pain and inflammation in BUEs for better tolerance to self care tasks.   PERFORMANCE DEFICITS: in functional skills including ADLs, ROM, strength, pain, flexibility, decreased knowledge of precautions, decreased knowledge of use of DME, and UE functional use.  IMPAIRMENTS: are limiting patient from ADLs.   COMORBIDITIES: has co-morbidities such as arthritis  that affects occupational performance. Patient will benefit from skilled OT to address above impairments and improve overall function.  MODIFICATION OR ASSISTANCE TO COMPLETE EVALUATION: No modification of tasks or assist necessary to complete an evaluation.  OT OCCUPATIONAL PROFILE AND HISTORY: Problem focused assessment: Including review of records relating to presenting problem.  CLINICAL DECISION MAKING: Moderate - several treatment options, min-mod task modification necessary  REHAB POTENTIAL: good  EVALUATION COMPLEXITY: Moderate      PLAN:  OT FREQUENCY: 2x/week  OT DURATION: 6 weeks  PLANNED INTERVENTIONS: self care/ADL training, therapeutic exercise, therapeutic activity, manual therapy, passive range of motion, iontophoresis, moist heat, cryotherapy, contrast bath, patient/family education, and DME and/or AE instructions  RECOMMENDED OTHER SERVICES: None at this time  CONSULTED AND AGREED WITH PLAN OF CARE: Patient and family member/caregiver  PLAN FOR NEXT SESSION: see eval  Leta Speller, MS, OTR/L  Darleene Cleaver, OT 11/23/2022, 4:30 PM

## 2022-11-25 ENCOUNTER — Ambulatory Visit: Payer: Medicare HMO

## 2022-11-25 DIAGNOSIS — Z0100 Encounter for examination of eyes and vision without abnormal findings: Secondary | ICD-10-CM | POA: Diagnosis not present

## 2022-11-30 ENCOUNTER — Ambulatory Visit: Payer: Medicare HMO

## 2022-12-01 ENCOUNTER — Ambulatory Visit: Payer: Medicare HMO | Attending: Family Medicine

## 2022-12-01 DIAGNOSIS — M25522 Pain in left elbow: Secondary | ICD-10-CM

## 2022-12-01 DIAGNOSIS — M25521 Pain in right elbow: Secondary | ICD-10-CM

## 2022-12-01 DIAGNOSIS — M6281 Muscle weakness (generalized): Secondary | ICD-10-CM | POA: Diagnosis not present

## 2022-12-02 ENCOUNTER — Ambulatory Visit: Payer: Medicare HMO

## 2022-12-02 DIAGNOSIS — M25522 Pain in left elbow: Secondary | ICD-10-CM | POA: Diagnosis not present

## 2022-12-02 DIAGNOSIS — M25521 Pain in right elbow: Secondary | ICD-10-CM | POA: Diagnosis not present

## 2022-12-02 DIAGNOSIS — M6281 Muscle weakness (generalized): Secondary | ICD-10-CM | POA: Diagnosis not present

## 2022-12-02 NOTE — Therapy (Signed)
OUTPATIENT OCCUPATIONAL THERAPY ORTHO TREATMENT  Patient Name: Daniel Carr MRN: [id number] DOB:09/10/1935, 87 y.o., male Today's Date: 12/02/2022  PCP: Dr. [name] REFERRING PROVIDER: Dr. [name]  END OF SESSION:  OT End of Session - 12/02/22 1634     Visit Number 2    Number of Visits 12    Date for OT Re-Evaluation 01/03/23    OT Start Time 1430    OT Stop Time 1515    OT Time Calculation (min) 45 min    Activity Tolerance Patient tolerated treatment well    Behavior During Therapy WFL for tasks assessed/performed             Past Medical History:  Diagnosis Date   Arthritis    childhood infection    chicken pox, measels, and mumps   Hypertension    Hypothyroidism    Sinus problem    Ulnar neuropathy    bilateral   Past Surgical History:  Procedure Laterality Date   APPENDECTOMY     CATARACT EXTRACTION Bilateral    COLONOSCOPY     JOINT REPLACEMENT Right    KNEE ARTHROPLASTY Left 12/24/2020   Procedure: COMPUTER ASSISTED TOTAL KNEE ARTHROPLASTY;  Surgeon: Dereck Leep, MD;  Location: [facility] OR[facility];  Service: Orthopedics;  Laterality: Left;  3RD CASE   KNEE SURGERY Right 10/04/2011   replacement   NM MYOVIEW ([facility] HX)  07/21/2017   [place]. No ischemia.    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   Pulmonary embolism ([place]) 01/14/2021   Total knee replacement status 63/14/9702   Systolic murmur 63/78/5885   Allergic rhinitis 02/19/2019   MGUS (monoclonal gammopathy of unknown significance) 02/77/4128   Systolic dysfunction 78/67/6720   Primary osteoarthritis of left knee 07/28/2017   Callus of foot 07/27/2016   Nail dystrophy 07/27/2016   Arthropathia 06/09/2015   Neuropathic ulnar nerve 06/09/2015   Glaucoma    Hypothyroid    Pure hypercholesterolemia    Hypertension, benign    Arthritis, multiple joint involvement    BPH  (benign prostatic hyperplasia)    ED (erectile dysfunction)    Nocturia associated with benign prostatic hypertrophy     ONSET DATE: >6 months  REFERRING DIAG: Pain in both elbows  THERAPY DIAG:  Muscle weakness (generalized)  Pain in left elbow  Pain in right elbow  Rationale for Evaluation and Treatment: Rehabilitation  SUBJECTIVE:   SUBJECTIVE STATEMENT: Pt reports doing well today. Pt accompanied by: self  PERTINENT HISTORY: Per chart: Bilateral elbow tendonitis On 10/26/22 pt seen by PCP and was given naproxen '500mg'$  twice daily for 14 days. Plan was to call for orthopedic referral if not much better when finished.  After 2 weeks, pain was improved but still present.  PRECAUTIONS: None  WEIGHT BEARING RESTRICTIONS: No  PAIN:  Are you having pain? Yes: NPRS scale: 2-10/10 Pain location: bilat elbows  Pain description: sharp  Aggravating factors: pt and daughter report no activities change the way pain comes and goes  Relieving factors: Naproxen,  heat  FALLS: Has patient fallen in last 6 months? No  LIVING ENVIRONMENT: Lives with:  spouse who is bedridden, but pt has 24 hr aids who manage  all IADLs for spouse; daughter also helps often and assists with transportation  Lives in: House/apartment Stairs: No Has following equipment at home: Single point cane  PLOF:  Independent with basic ADLs  PATIENT GOALS: "Get rid of this pain."  NEXT MD VISIT: nothing scheduled  OBJECTIVE:   HAND DOMINANCE: Right  ADLs: Overall ADLs: modified indep with basic ADLs using SPC; pain in elbows fluctuates with activity Transfers/ambulation related to ADLs: modified indep with SPC  IADLs: aids present in the home 24/7 d/t spouse being bedridden.  Pt's daughter, Clarene Critchley lives 4 doors down and is also over often to assist as needed.  All IADLs are managed between aids and daughter.  FUNCTIONAL OUTCOME MEASURES: FOTO: 47, predicted 62   UPPER EXTREMITY ROM:     Active ROM  Right eval Left eval  Shoulder flexion    Shoulder abduction    Shoulder adduction    Shoulder extension    Shoulder internal rotation    Shoulder external rotation    Elbow flexion 137 135  Elbow extension -23 -25  Wrist flexion 78 90  Wrist extension 68 70  Wrist ulnar deviation    Wrist radial deviation    Wrist pronation 90 90  Wrist supination 78 80  (Blank rows = not tested)   UPPER EXTREMITY MMT:     MMT Right eval Left eval  Shoulder flexion    Shoulder abduction    Shoulder adduction    Shoulder extension    Shoulder internal rotation    Shoulder external rotation    Middle trapezius    Lower trapezius    Elbow flexion 4+ 4+  Elbow extension 3+  (limited by pain) 4  Wrist flexion 4+   Wrist extension 5   Wrist ulnar deviation    Wrist radial deviation    Wrist pronation 90   Wrist supination 78   (Blank rows = not tested)  HAND FUNCTION: Grip strength: Right: 34 lbs; Left: 26 lbs, Lateral pinch: Right: 13 lbs, Left: 9 lbs, and 3 point pinch: Right: 13 lbs, Left: 10 lbs  COORDINATION: Limited d/t severe arthritic deformities; see below in observations.    SENSATION: Pt reports hx of neuropathy in bilat hands and feet.  EDEMA: No visible edema, only arthritic joint deformities in elbows and hands bilaterally  COGNITION: Overall cognitive status: Within functional limits for tasks assessed  OBSERVATIONS: Pt has severe arthritic deformities in bilat hands, including severe ulnar drift in digits 2-5 of both hands, R hand flexion contractures in the MCP joints of digits 2-5, swan neck deformity in the R 5th digit, and swan neck deformities in the L hand digits 3-5.    TODAY'S TREATMENT:                                                                                                                              Iontophoresis: 2cc Dexamethasone placed on R medial epicondyle, 40 mA/min, 2.0 current; 20 min tx time, working to reduce inflammation at medial  elbow.  Manual Therapy: Soft tissue massage performed to L medial and lateral elbow, as well as bicep insertion,  volar elbow crease where pt notes pain and tightness, and dorsal forearm/wrist extensors, working to decrease pain, reduce inflammation, and increase tissue extensibility for greater ROM.  Soft tissue massage to R medial and lateral elbow, bicep/tricep insertion, and dorsal forearm/wrist extensors for same.   Therapeutic Exercise: Performed gentle passive elbow flex/ext to end range bilaterally, forearm pron/sup, and wrist flex/ext with extended elbow with 20 sec hold.  Instructed pt in self passive stretching for same, requiring mod tactile and vc for return demo.  Encouraged daily stretches at home for pain reduction and increasing ROM for daily tasks.  PATIENT EDUCATION: Education details: HEP to include self PROM to bilat elbows/wrists Person educated: Patient and Child(ren) Education method: Explanation Education comprehension: verbalized understanding and needs further education  HOME EXERCISE PROGRAM: PROM to bilat elbows/wrists  GOALS: Goals reviewed with patient? Yes  SHORT TERM GOALS: Target date: 12/20/22 (4 weeks)  Pt will be indep with HEP for improving BUE strength and flexibility. Baseline: No yet established Goal status: INITIAL  2.  Pt will be indep to verbalize 2-3 joint protection strategies for BUEs to minimize pain and reduce additional cumulative trauma injury. Baseline: Educ not yet provided Goal status: INITIAL  LONG TERM GOALS: Target date: 01/03/23 (6 weeks)  Pt will increase FOTO score to 55 or better to demonstrate clinically relevant improvement to performance of daily tasks.  Baseline: 47 (predicted 62) Goal status: INITIAL  2.  Pt will tolerate modalities, manual therapy, and therapeutic exercises to minimize pain in BUEs, consistently reporting <4/10 pain in BUEs with activity. Baseline: Pt reports large fluctuation in pain, ranging from  2-10/10 pain with activity in bilat elbows. Goal status: INITIAL  3.  Pt will increase R/L elbow extension strength by 1/2 grade or more to more easily push self up from arm rests of a chair. Baseline: R elbow ext 3+, L 4 Goal status: INITIAL  4.  Pt will be indep to open jars in kitchen with adapted equipment as needed.  Baseline: Unable to open jars Goal status: INITIAL  ASSESSMENT:  CLINICAL IMPRESSION: Good tolerance to iontophoresis, soft tissue massage, and ROM this date.  Pain noted to be quite diffuse around all aspects of bilat elbows, so focus was on soft tissue massage and gentle passive stretching along with iontophoresis to most tender area that was noted, see above.  Pt will benefit from continued skilled OT to address bilat elbow pain with educ on cumulative trauma injury prevention, joint protection strategies, instruction in HEP, manual therapy to provide STM, and iontophoresis with Dexamethasone in order to reduce pain and inflammation in BUEs for better tolerance to self care tasks.   PERFORMANCE DEFICITS: in functional skills including ADLs, ROM, strength, pain, flexibility, decreased knowledge of precautions, decreased knowledge of use of DME, and UE functional use.  IMPAIRMENTS: are limiting patient from ADLs.   COMORBIDITIES: has co-morbidities such as arthritis  that affects occupational performance. Patient will benefit from skilled OT to address above impairments and improve overall function.  MODIFICATION OR ASSISTANCE TO COMPLETE EVALUATION: No modification of tasks or assist necessary to complete an evaluation.  OT OCCUPATIONAL PROFILE AND HISTORY: Problem focused assessment: Including review of records relating to presenting problem.  CLINICAL DECISION MAKING: Moderate - several treatment options, min-mod task modification necessary  REHAB POTENTIAL: good  EVALUATION COMPLEXITY: Moderate      PLAN:  OT FREQUENCY: 2x/week  OT DURATION: 6  weeks  PLANNED INTERVENTIONS: self care/ADL training, therapeutic exercise, therapeutic activity, manual therapy, passive  range of motion, iontophoresis, moist heat, cryotherapy, contrast bath, patient/family education, and DME and/or AE instructions  RECOMMENDED OTHER SERVICES: None at this time  CONSULTED AND AGREED WITH PLAN OF CARE: Patient and family member/caregiver  PLAN FOR NEXT SESSION: see eval  Leta Speller, MS, OTR/L  Darleene Cleaver, OT 12/02/2022, 4:37 PM

## 2022-12-05 NOTE — Therapy (Signed)
OUTPATIENT OCCUPATIONAL THERAPY ORTHO TREATMENT  Patient Name: Daniel Carr MRN: [id number] DOB:29-Oct-1935, 87 y.o., male Today's Date: 12/05/2022  PCP: Dr. [name] REFERRING PROVIDER: Dr. [name]  END OF SESSION:  OT End of Session - 12/05/22 1401     Visit Number 3    Number of Visits 12    Date for OT Re-Evaluation 01/03/23    OT Start Time 1145    OT Stop Time 1230    OT Time Calculation (min) 45 min    Activity Tolerance Patient tolerated treatment well    Behavior During Therapy WFL for tasks assessed/performed             Past Medical History:  Diagnosis Date   Arthritis    childhood infection    chicken pox, measels, and mumps   Hypertension    Hypothyroidism    Sinus problem    Ulnar neuropathy    bilateral   Past Surgical History:  Procedure Laterality Date   APPENDECTOMY     CATARACT EXTRACTION Bilateral    COLONOSCOPY     JOINT REPLACEMENT Right    KNEE ARTHROPLASTY Left 12/24/2020   Procedure: COMPUTER ASSISTED TOTAL KNEE ARTHROPLASTY;  Surgeon: Dereck Leep, MD;  Location: [facility] ORS;  Service: Orthopedics;  Laterality: Left;  3RD CASE   KNEE SURGERY Right 10/04/2011   replacement   NM MYOVIEW ([facility] HX)  07/21/2017   KC. No ischemia.    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   Pulmonary embolism ([place]) 01/14/2021   Total knee replacement status 94/70/9628   Systolic murmur 36/62/9476   Allergic rhinitis 02/19/2019   MGUS (monoclonal gammopathy of unknown significance) 54/65/0354   Systolic dysfunction 65/68/1275   Primary osteoarthritis of left knee 07/28/2017   Callus of foot 07/27/2016   Nail dystrophy 07/27/2016   Arthropathia 06/09/2015   Neuropathic ulnar nerve 06/09/2015   Glaucoma    Hypothyroid    Pure hypercholesterolemia    Hypertension, benign    Arthritis, multiple joint involvement    BPH  (benign prostatic hyperplasia)    ED (erectile dysfunction)    Nocturia associated with benign prostatic hypertrophy     ONSET DATE: >6 months  REFERRING DIAG: Pain in both elbows  THERAPY DIAG:  Muscle weakness (generalized)  Pain in left elbow  Pain in right elbow  Rationale for Evaluation and Treatment: Rehabilitation  SUBJECTIVE:  SUBJECTIVE STATEMENT: Pt reports doing well today. Pt accompanied by: self  PERTINENT HISTORY: Per chart: Bilateral elbow tendonitis On 10/26/22 pt seen by PCP and was given naproxen '500mg'$  twice daily for 14 days. Plan was to call for orthopedic referral if not much better when finished.  After 2 weeks, pain was improved but still present.  PRECAUTIONS: None  WEIGHT BEARING RESTRICTIONS: No  PAIN:  Are you having pain? Yes: NPRS scale: 2-10/10 Pain location: bilat elbows  Pain description: sharp  Aggravating factors: pt and daughter report no activities change the way pain comes and goes  Relieving factors: Naproxen,  heat  FALLS: Has patient fallen in last 6 months? No  LIVING ENVIRONMENT: Lives with:  spouse who is bedridden, but pt has 24 hr aids who manage  all IADLs for spouse; daughter also helps often and assists with transportation  Lives in: House/apartment Stairs: No Has following equipment at home: Single point cane  PLOF: Independent  with basic ADLs  PATIENT GOALS: "Get rid of this pain."  NEXT MD VISIT: nothing scheduled  OBJECTIVE:   HAND DOMINANCE: Right  ADLs: Overall ADLs: modified indep with basic ADLs using SPC; pain in elbows fluctuates with activity Transfers/ambulation related to ADLs: modified indep with SPC  IADLs: aids present in the home 24/7 d/t spouse being bedridden.  Pt's daughter, Clarene Critchley lives 4 doors down and is also over often to assist as needed.  All IADLs are managed between aids and daughter.  FUNCTIONAL OUTCOME MEASURES: FOTO: 47, predicted 62   UPPER EXTREMITY ROM:     Active ROM  Right eval Left eval  Shoulder flexion    Shoulder abduction    Shoulder adduction    Shoulder extension    Shoulder internal rotation    Shoulder external rotation    Elbow flexion 137 135  Elbow extension -23 -25  Wrist flexion 78 90  Wrist extension 68 70  Wrist ulnar deviation    Wrist radial deviation    Wrist pronation 90 90  Wrist supination 78 80  (Blank rows = not tested)   UPPER EXTREMITY MMT:     MMT Right eval Left eval  Shoulder flexion    Shoulder abduction    Shoulder adduction    Shoulder extension    Shoulder internal rotation    Shoulder external rotation    Middle trapezius    Lower trapezius    Elbow flexion 4+ 4+  Elbow extension 3+  (limited by pain) 4  Wrist flexion 4+   Wrist extension 5   Wrist ulnar deviation    Wrist radial deviation    Wrist pronation 90   Wrist supination 78   (Blank rows = not tested)  HAND FUNCTION: Grip strength: Right: 34 lbs; Left: 26 lbs, Lateral pinch: Right: 13 lbs, Left: 9 lbs, and 3 point pinch: Right: 13 lbs, Left: 10 lbs  COORDINATION: Limited d/t severe arthritic deformities; see below in observations.    SENSATION: Pt reports hx of neuropathy in bilat hands and feet.  EDEMA: No visible edema, only arthritic joint deformities in elbows and hands bilaterally  COGNITION: Overall cognitive status: Within functional limits for tasks assessed  OBSERVATIONS: Pt has severe arthritic deformities in bilat hands, including severe ulnar drift in digits 2-5 of both hands, R hand flexion contractures in the MCP joints of digits 2-5, swan neck deformity in the R 5th digit, and swan neck deformities in the L hand digits 3-5.    TODAY'S TREATMENT:                   Self Care: Recommendation made for bilat elbow straps.  Educated on benefits of straps to rest muscles around the elbow and advised on options to obtain.  Daughter plans to order.  Reviewed joint protections strategies, avoiding repetitive gripping,  pinching, and lifting.  When lifting use 2 hands when possible and hold items close to body to reduce strain on elbows.  Pt verbalized understanding.  Manual Therapy: Soft tissue massage performed to L medial and lateral elbow, as well as bicep insertion, volar elbow crease where pt notes pain and tightness, and dorsal forearm/wrist extensors, working to decrease pain, reduce inflammation, and increase tissue extensibility for greater ROM.  Soft tissue massage to R medial and lateral elbow, bicep/tricep insertion, and dorsal forearm/wrist extensors for same.   Therapeutic Exercise: Performed gentle passive elbow flex/ext to end range bilaterally, forearm pron/sup, and wrist flex/ext with extended elbow with 20 sec hold.  Reviewed self passive stretching for same, requiring min tactile and vc for return demo.  Encouraged daily stretches at home for pain reduction and increasing ROM for daily tasks.  PATIENT EDUCATION: Education details: benefits of elbow straps Person educated: Patient and Child(ren) Education method: Explanation Education comprehension: verbalized understanding and needs further education  HOME EXERCISE PROGRAM: PROM to bilat elbows/wrists  GOALS: Goals reviewed with patient? Yes  SHORT TERM GOALS: Target date: 12/20/22 (4 weeks)  Pt will be indep with HEP for improving BUE strength and flexibility. Baseline: No yet established Goal status: INITIAL  2.  Pt will be indep to verbalize 2-3 joint protection strategies for BUEs to minimize pain and reduce additional cumulative trauma injury. Baseline: Educ not yet provided Goal status: INITIAL  LONG TERM GOALS: Target date: 01/03/23 (6 weeks)  Pt will increase FOTO score to 55 or better to demonstrate clinically relevant improvement to performance of daily tasks.  Baseline: 47 (predicted 62) Goal status: INITIAL  2.   Pt will tolerate modalities, manual therapy, and therapeutic exercises to minimize pain in BUEs, consistently reporting <4/10 pain in BUEs with activity. Baseline: Pt reports large fluctuation in pain, ranging from 2-10/10 pain with activity in bilat elbows. Goal status: INITIAL  3.  Pt will increase R/L elbow extension strength by 1/2 grade or more to more easily push self up from arm rests of a chair. Baseline: R elbow ext 3+, L 4 Goal status: INITIAL  4.  Pt will be indep to open jars in kitchen with adapted equipment as needed.  Baseline: Unable to open jars Goal status: INITIAL  ASSESSMENT:  CLINICAL IMPRESSION: No iontophoresis done today as pt had tx for this yesterday.  OT advised pt to schedule at least 1 day in between sessions for consistent iontophoresis tx, and these txs should not be completed 2 days in a row as to reduce skin irritation.  Pt verbalized understanding.  Good tolerance to soft tissue massage, and ROM this date.  Pain continues to be quite diffuse around all aspects of bilat elbows.  Educated on benefits of elbow straps for bracing; daughter plans to obtain.  Pt will benefit from continued skilled OT to address bilat elbow pain with educ on cumulative trauma injury prevention, joint protection strategies, instruction in HEP, manual therapy to provide STM, and iontophoresis with Dexamethasone in order to reduce pain and inflammation in BUEs for better tolerance to self care tasks.   PERFORMANCE DEFICITS: in functional skills including ADLs, ROM, strength, pain, flexibility, decreased knowledge of precautions, decreased knowledge of use of DME, and UE functional use.  IMPAIRMENTS: are limiting patient from ADLs.   COMORBIDITIES: has co-morbidities such as arthritis  that affects occupational performance. Patient will benefit from skilled OT to address above impairments and improve overall function.  MODIFICATION OR ASSISTANCE TO COMPLETE EVALUATION: No modification  of tasks or assist necessary to complete an evaluation.  OT OCCUPATIONAL PROFILE AND HISTORY: Problem focused assessment: Including review of records relating to presenting  problem.  CLINICAL DECISION MAKING: Moderate - several treatment options, min-mod task modification necessary  REHAB POTENTIAL: good  EVALUATION COMPLEXITY: Moderate      PLAN:  OT FREQUENCY: 2x/week  OT DURATION: 6 weeks  PLANNED INTERVENTIONS: self care/ADL training, therapeutic exercise, therapeutic activity, manual therapy, passive range of motion, iontophoresis, moist heat, cryotherapy, contrast bath, patient/family education, and DME and/or AE instructions  RECOMMENDED OTHER SERVICES: None at this time  CONSULTED AND AGREED WITH PLAN OF CARE: Patient and family member/caregiver  PLAN FOR NEXT SESSION: see eval  Leta Speller, MS, OTR/L  Darleene Cleaver, OT 12/05/2022, 2:05 PM

## 2022-12-06 ENCOUNTER — Ambulatory Visit: Payer: Medicare HMO

## 2022-12-06 DIAGNOSIS — M25522 Pain in left elbow: Secondary | ICD-10-CM

## 2022-12-06 DIAGNOSIS — M25521 Pain in right elbow: Secondary | ICD-10-CM

## 2022-12-06 DIAGNOSIS — M6281 Muscle weakness (generalized): Secondary | ICD-10-CM | POA: Diagnosis not present

## 2022-12-06 NOTE — Therapy (Signed)
OUTPATIENT OCCUPATIONAL THERAPY ORTHO TREATMENT  Patient Name: Daniel Carr MRN: [id number] DOB:08/01/1935, 87 y.o., male Today's Date: 12/05/2022  PCP: Dr. [name] REFERRING PROVIDER: Dr. [name]  END OF SESSION:  OT End of Session - 12/05/22 1401     Visit Number 3    Number of Visits 12    Date for OT Re-Evaluation 01/03/23    OT Start Time 1145    OT Stop Time 1230    OT Time Calculation (min) 45 min    Activity Tolerance Patient tolerated treatment well    Behavior During Therapy WFL for tasks assessed/performed             Past Medical History:  Diagnosis Date   Arthritis    childhood infection    chicken pox, measels, and mumps   Hypertension    Hypothyroidism    Sinus problem    Ulnar neuropathy    bilateral   Past Surgical History:  Procedure Laterality Date   APPENDECTOMY     CATARACT EXTRACTION Bilateral    COLONOSCOPY     JOINT REPLACEMENT Right    KNEE ARTHROPLASTY Left 12/24/2020   Procedure: COMPUTER ASSISTED TOTAL KNEE ARTHROPLASTY;  Surgeon: Dereck Leep, MD;  Location: [facility];  Service: Orthopedics;  Laterality: Left;  3RD CASE   KNEE SURGERY Right 10/04/2011   replacement   NM MYOVIEW ([facility] HX)  07/21/2017   [place]. No ischemia.    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   Pulmonary embolism ([place]) 01/14/2021   Total knee replacement status 76/16/0737   Systolic murmur 10/62/6948   Allergic rhinitis 02/19/2019   MGUS (monoclonal gammopathy of unknown significance) 54/62/7035   Systolic dysfunction 00/93/8182   Primary osteoarthritis of left knee 07/28/2017   Callus of foot 07/27/2016   Nail dystrophy 07/27/2016   Arthropathia 06/09/2015   Neuropathic ulnar nerve 06/09/2015   Glaucoma    Hypothyroid    Pure hypercholesterolemia    Hypertension, benign    Arthritis, multiple joint involvement    BPH  (benign prostatic hyperplasia)    ED (erectile dysfunction)    Nocturia associated with benign prostatic hypertrophy     ONSET DATE: >6 months  REFERRING DIAG: Pain in both elbows  THERAPY DIAG:  Muscle weakness (generalized)  Pain in left elbow  Pain in right elbow  Rationale for Evaluation and Treatment: Rehabilitation  SUBJECTIVE:  SUBJECTIVE STATEMENT: Pt reports completing his stretches every day.  Pt accompanied by: self  PERTINENT HISTORY: Per chart: Bilateral elbow tendonitis On 10/26/22 pt seen by PCP and was given naproxen '500mg'$  twice daily for 14 days. Plan was to call for orthopedic referral if not much better when finished.  After 2 weeks, pain was improved but still present.  PRECAUTIONS: None  WEIGHT BEARING RESTRICTIONS: No  PAIN:  Are you having pain? No  FALLS: Has patient fallen in last 6 months? No  LIVING ENVIRONMENT: Lives with:  spouse who is bedridden, but pt has 24 hr aids who manage  all IADLs for spouse; daughter also helps often and assists with transportation  Lives in: House/apartment Stairs: No Has following equipment at home: Single point cane  PLOF: Independent with basic ADLs  PATIENT GOALS: "Get rid of this pain."  NEXT MD VISIT: nothing scheduled  OBJECTIVE:   HAND DOMINANCE: Right  ADLs: Overall ADLs: modified indep  with basic ADLs using SPC; pain in elbows fluctuates with activity Transfers/ambulation related to ADLs: modified indep with SPC  IADLs: aids present in the home 24/7 d/t spouse being bedridden.  Pt's daughter, Clarene Critchley lives 4 doors down and is also over often to assist as needed.  All IADLs are managed between aids and daughter.  FUNCTIONAL OUTCOME MEASURES: FOTO: 47, predicted 62   UPPER EXTREMITY ROM:     Active ROM Right eval Left eval  Shoulder flexion    Shoulder abduction    Shoulder adduction    Shoulder extension    Shoulder internal rotation    Shoulder external rotation    Elbow flexion  137 135  Elbow extension -23 -25  Wrist flexion 78 90  Wrist extension 68 70  Wrist ulnar deviation    Wrist radial deviation    Wrist pronation 90 90  Wrist supination 78 80  (Blank rows = not tested)   UPPER EXTREMITY MMT:     MMT Right eval Left eval  Shoulder flexion    Shoulder abduction    Shoulder adduction    Shoulder extension    Shoulder internal rotation    Shoulder external rotation    Middle trapezius    Lower trapezius    Elbow flexion 4+ 4+  Elbow extension 3+  (limited by pain) 4  Wrist flexion 4+   Wrist extension 5   Wrist ulnar deviation    Wrist radial deviation    Wrist pronation 90   Wrist supination 78   (Blank rows = not tested)  HAND FUNCTION: Grip strength: Right: 34 lbs; Left: 26 lbs, Lateral pinch: Right: 13 lbs, Left: 9 lbs, and 3 point pinch: Right: 13 lbs, Left: 10 lbs  COORDINATION: Limited d/t severe arthritic deformities; see below in observations.    SENSATION: Pt reports hx of neuropathy in bilat hands and feet.  EDEMA: No visible edema, only arthritic joint deformities in elbows and hands bilaterally  COGNITION: Overall cognitive status: Within functional limits for tasks assessed  OBSERVATIONS: Pt has severe arthritic deformities in bilat hands, including severe ulnar drift in digits 2-5 of both hands, R hand flexion contractures in the MCP joints of digits 2-5, swan neck deformity in the R 5th digit, and swan neck deformities in the L hand digits 3-5.    TODAY'S TREATMENT:                   Iontophoresis: 2cc Dexamethasone placed on R medial epicondyle, 40 mA/min, 2.0 current; 20 min tx time, working to reduce inflammation at medial elbow.   Manual Therapy: Soft tissue massage performed to L medial and lateral elbow, as well as bicep insertion, volar elbow crease where pt notes pain and tightness, and dorsal forearm/wrist extensors, working to decrease pain, reduce inflammation, and increase tissue extensibility for  greater ROM.  Soft tissue massage to R medial and lateral elbow, bicep/tricep insertion, and dorsal forearm/wrist extensors for same  Self Care: Pt presents with recently obtained B elbow straps. Skin check complete with no redness however slight indentations into forearm and pt notes mild skin irritation. Reviewed importance of skin checks. Fair recall of joint protections strategies - reviewed functional application.  PATIENT EDUCATION: Education details: benefits of elbow straps Person educated: Patient and Child(ren) Education method: Explanation Education comprehension: verbalized understanding and needs further education  HOME EXERCISE PROGRAM: PROM to bilat elbows/wrists  GOALS: Goals reviewed with patient? Yes  SHORT TERM GOALS: Target date: 12/20/22 (4 weeks)  Pt will be indep with HEP for improving BUE strength and flexibility. Baseline: No yet established Goal status: INITIAL  2.  Pt will be indep to verbalize 2-3 joint protection strategies for BUEs to minimize pain and reduce additional cumulative trauma injury. Baseline: Educ not yet provided Goal status: INITIAL  LONG TERM GOALS: Target date: 01/03/23 (6 weeks)  Pt will increase FOTO score to 55 or better to demonstrate clinically relevant improvement to performance of daily tasks.  Baseline: 47 (predicted 62) Goal status: INITIAL  2.  Pt will tolerate modalities, manual therapy, and therapeutic exercises to minimize pain in BUEs, consistently reporting <4/10 pain in BUEs with activity. Baseline: Pt reports large fluctuation in pain, ranging from 2-10/10 pain with activity in bilat elbows. Goal status: INITIAL  3.  Pt will increase R/L elbow extension strength by 1/2 grade or more to more easily push self up from arm rests of a chair. Baseline: R elbow ext 3+, L 4 Goal status: INITIAL  4.  Pt will be  indep to open jars in kitchen with adapted equipment as needed.  Baseline: Unable to open jars Goal status: INITIAL  ASSESSMENT:  CLINICAL IMPRESSION: Good tolerance to iontonphoresis and soft tissue massage this date. Pt will benefit from continued skilled OT to address bilat elbow pain with educ on cumulative trauma injury prevention, joint protection strategies, instruction in HEP, manual therapy to provide STM, and iontophoresis with Dexamethasone in order to reduce pain and inflammation in BUEs for better tolerance to self care tasks.   PERFORMANCE DEFICITS: in functional skills including ADLs, ROM, strength, pain, flexibility, decreased knowledge of precautions, decreased knowledge of use of DME, and UE functional use.  IMPAIRMENTS: are limiting patient from ADLs.   COMORBIDITIES: has co-morbidities such as arthritis  that affects occupational performance. Patient will benefit from skilled OT to address above impairments and improve overall function.  MODIFICATION OR ASSISTANCE TO COMPLETE EVALUATION: No modification of tasks or assist necessary to complete an evaluation.  OT OCCUPATIONAL PROFILE AND HISTORY: Problem focused assessment: Including review of records relating to presenting problem.  CLINICAL DECISION MAKING: Moderate - several treatment options, min-mod task modification necessary  REHAB POTENTIAL: good  EVALUATION COMPLEXITY: Moderate      PLAN:  OT FREQUENCY: 2x/week  OT DURATION: 6 weeks  PLANNED INTERVENTIONS: self care/ADL training, therapeutic exercise, therapeutic activity, manual therapy, passive range of motion, iontophoresis, moist heat, cryotherapy, contrast bath, patient/family education, and DME and/or AE instructions  RECOMMENDED OTHER SERVICES: None at this time  CONSULTED AND AGREED WITH PLAN OF CARE: Patient and family member/caregiver  PLAN FOR NEXT SESSION: see eval   Dessie Coma, M.S. OTR/L  12/06/22, 1:58 PM  ascom  [phone]

## 2022-12-08 ENCOUNTER — Ambulatory Visit: Payer: Medicare HMO

## 2022-12-09 ENCOUNTER — Ambulatory Visit: Payer: Medicare HMO

## 2022-12-09 DIAGNOSIS — M6281 Muscle weakness (generalized): Secondary | ICD-10-CM | POA: Diagnosis not present

## 2022-12-09 DIAGNOSIS — M25521 Pain in right elbow: Secondary | ICD-10-CM

## 2022-12-09 DIAGNOSIS — M25522 Pain in left elbow: Secondary | ICD-10-CM | POA: Diagnosis not present

## 2022-12-09 NOTE — Therapy (Signed)
OUTPATIENT OCCUPATIONAL THERAPY ORTHO TREATMENT  Patient Name: Daniel Carr MRN: [id number] DOB:12-15-1935, 87 y.o., male Today's Date: 12/09/2022  PCP: Dr. [name] REFERRING PROVIDER: Dr. [name]  END OF SESSION:  OT End of Session - 12/09/22 1331     Visit Number 5    Number of Visits 12    Date for OT Re-Evaluation 01/03/23    OT Start Time 1145    OT Stop Time 1230    OT Time Calculation (min) 45 min    Activity Tolerance Patient tolerated treatment well    Behavior During Therapy WFL for tasks assessed/performed             Past Medical History:  Diagnosis Date   Arthritis    childhood infection    chicken pox, measels, and mumps   Hypertension    Hypothyroidism    Sinus problem    Ulnar neuropathy    bilateral   Past Surgical History:  Procedure Laterality Date   APPENDECTOMY     CATARACT EXTRACTION Bilateral    COLONOSCOPY     JOINT REPLACEMENT Right    KNEE ARTHROPLASTY Left 12/24/2020   Procedure: COMPUTER ASSISTED TOTAL KNEE ARTHROPLASTY;  Surgeon: Dereck Leep, MD;  Location: [facility] ORS;  Service: Orthopedics;  Laterality: Left;  3RD CASE   KNEE SURGERY Right 10/04/2011   replacement   NM MYOVIEW ([facility] HX)  07/21/2017   KC. No ischemia.    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   Pulmonary embolism ([place]) 01/14/2021   Total knee replacement status 03/47/4259   Systolic murmur 56/38/7564   Allergic rhinitis 02/19/2019   MGUS (monoclonal gammopathy of unknown significance) 33/29/5188   Systolic dysfunction 41/66/0630   Primary osteoarthritis of left knee 07/28/2017   Callus of foot 07/27/2016   Nail dystrophy 07/27/2016   Arthropathia 06/09/2015   Neuropathic ulnar nerve 06/09/2015   Glaucoma    Hypothyroid    Pure hypercholesterolemia    Hypertension, benign    Arthritis, multiple joint involvement    BPH  (benign prostatic hyperplasia)    ED (erectile dysfunction)    Nocturia associated with benign prostatic hypertrophy     ONSET DATE: >6 months  REFERRING DIAG: Pain in both elbows  THERAPY DIAG:  Muscle weakness (generalized)  Pain in left elbow  Pain in right elbow  Rationale for Evaluation and Treatment: Rehabilitation  SUBJECTIVE:  SUBJECTIVE STATEMENT: Pt reports R elbow still gets a shooting pain in it, but not as often since he's started therapy. Pt accompanied by: self and daughter, Clarene Critchley  PERTINENT HISTORY: Per chart: Bilateral elbow tendonitis On 10/26/22 pt seen by PCP and was given naproxen '500mg'$  twice daily for 14 days. Plan was to call for orthopedic referral if not much better when finished.  After 2 weeks, pain was improved but still present.  PRECAUTIONS: None  WEIGHT BEARING RESTRICTIONS: No  PAIN:  Are you having pain? Yes: NPRS scale: 2-10/10 Pain location: bilat elbows  Pain description: sharp  Aggravating factors: pt and daughter report no activities change the way pain comes and goes  Relieving factors: Naproxen,  heat  FALLS: Has patient fallen in last 6 months? No  LIVING ENVIRONMENT: Lives with:  spouse who is bedridden, but pt has 24 hr aids who manage  all IADLs for spouse; daughter also helps often and assists with transportation  Lives in: House/apartment Stairs: No Has following equipment at home: Single point cane  PLOF: Independent with basic ADLs  PATIENT GOALS: "Get rid of this pain."  NEXT MD VISIT: nothing scheduled  OBJECTIVE:   HAND DOMINANCE: Right  ADLs: Overall ADLs: modified indep with basic ADLs using SPC; pain in elbows fluctuates with activity Transfers/ambulation related to ADLs: modified indep with SPC  IADLs: aids present in the home 24/7 d/t spouse being bedridden.  Pt's daughter, Clarene Critchley lives 4 doors down and is also over often to assist as needed.  All IADLs are managed between aids and  daughter.  FUNCTIONAL OUTCOME MEASURES: FOTO: 47, predicted 62   UPPER EXTREMITY ROM:     Active ROM Right eval Left eval  Shoulder flexion    Shoulder abduction    Shoulder adduction    Shoulder extension    Shoulder internal rotation    Shoulder external rotation    Elbow flexion 137 135  Elbow extension -23 -25  Wrist flexion 78 90  Wrist extension 68 70  Wrist ulnar deviation    Wrist radial deviation    Wrist pronation 90 90  Wrist supination 78 80  (Blank rows = not tested)   UPPER EXTREMITY MMT:     MMT Right eval Left eval  Shoulder flexion    Shoulder abduction    Shoulder adduction    Shoulder extension    Shoulder internal rotation    Shoulder external rotation    Middle trapezius    Lower trapezius    Elbow flexion 4+ 4+  Elbow extension 3+  (limited by pain) 4  Wrist flexion 4+   Wrist extension 5   Wrist ulnar deviation    Wrist radial deviation    Wrist pronation 90   Wrist supination 78   (Blank rows = not tested)  HAND FUNCTION: Grip strength: Right: 34 lbs; Left: 26 lbs, Lateral pinch: Right: 13 lbs, Left: 9 lbs, and 3 point pinch: Right: 13 lbs, Left: 10 lbs  COORDINATION: Limited d/t severe arthritic deformities; see below in observations.    SENSATION: Pt reports hx of neuropathy in bilat hands and feet.  EDEMA: No visible edema, only arthritic joint deformities in elbows and hands bilaterally  COGNITION: Overall cognitive status: Within functional limits for tasks assessed  OBSERVATIONS: Pt has severe arthritic deformities in bilat hands, including severe ulnar drift in digits 2-5 of both hands, R hand flexion contractures in the MCP joints of digits 2-5, swan neck deformity in the R 5th digit, and swan neck deformities in the L hand digits 3-5.    TODAY'S TREATMENT:   Iontophoresis: 2cc Dexamethasone placed on R medial epicondyle, 40 mA/min, 2.0 current; 20 min tx time, working to reduce inflammation at medial elbow.              Manual Therapy: Soft tissue massage performed to L medial and lateral elbow, as well as bicep insertion, volar elbow crease where pt notes pain and tightness, and dorsal forearm/wrist extensors, working to decrease pain, reduce inflammation, and increase tissue extensibility for greater ROM.  Soft tissue massage to R medial and lateral elbow, bicep/tricep insertion, and dorsal forearm/wrist extensors for same.   Therapeutic Exercise: Performed gentle passive elbow flex/ext to end range bilaterally, and wrist flex/ext with extended elbow with 20 sec hold.  Instructed pt in ulnar nerve gliding for R/LUEs d/t pt reporting intermittent shooting pain through the elbow.  Pt requiring min tactile cues with each rep for correct form/positioning.  Pt completed 5 reps on each side.  OT issued written handout for ulnar nerve gliding.  Instructed pt in correct donning of elbow straps, just below elbows bilaterally and velcro should be pulled snug.  Daughter present for demo and both verbalized understanding.    PATIENT EDUCATION: Education details: benefits of elbow straps Person educated: Patient and Child(ren) Education method: Explanation Education comprehension: verbalized understanding and needs further education  HOME EXERCISE PROGRAM: PROM to bilat elbows/wrists  GOALS: Goals reviewed with patient? Yes  SHORT TERM GOALS: Target date: 12/20/22 (4 weeks)  Pt will be indep with HEP for improving BUE strength and flexibility. Baseline: No yet established Goal status: INITIAL  2.  Pt will be indep to verbalize 2-3 joint protection strategies for BUEs to minimize pain and reduce additional cumulative trauma injury. Baseline: Educ not yet provided Goal status: INITIAL  LONG TERM GOALS: Target date: 01/03/23 (6 weeks)  Pt will increase FOTO score to 55 or better to demonstrate clinically relevant improvement to performance of daily tasks.  Baseline: 47 (predicted 62) Goal status: INITIAL  2.   Pt will tolerate modalities, manual therapy, and therapeutic exercises to minimize pain in BUEs, consistently reporting <4/10 pain in BUEs with activity. Baseline: Pt reports large fluctuation in pain, ranging from 2-10/10 pain with activity in bilat elbows. Goal status: INITIAL  3.  Pt will increase R/L elbow extension strength by 1/2 grade or more to more easily push self up from arm rests of a chair. Baseline: R elbow ext 3+, L 4 Goal status: INITIAL  4.  Pt will be indep to open jars in kitchen with adapted equipment as needed.  Baseline: Unable to open jars Goal status: INITIAL  ASSESSMENT:  CLINICAL IMPRESSION: Good tolerance to iontophoresis this date.  Pt was not tender to the lateral or medial elbow today on the R arm, and stated that his pain was more like a shooting through the elbow but not consistent, and has been less frequent since starting therapy.  OT reviewed self PROM for elbows and wrists with good return demo, and instructed today in ulnar nerve glides bilaterally d/t reports of shooting pain through the elbow.  Pt able to return demo with mod tactile cues for form/technique.  Handout issued for this new exercise.  Pt required education on donning elbow straps, as pt had them loosely around his wrists.  Daughter present for demo and acknowledged proper placement to be just distal to elbows and should fit snug.   Pt will benefit from continued skilled OT to address bilat elbow pain with educ on cumulative trauma injury prevention, joint protection strategies, instruction in HEP, manual therapy to provide STM, and iontophoresis with Dexamethasone in order to reduce pain and inflammation in BUEs for better tolerance to self care tasks.   PERFORMANCE DEFICITS: in functional skills including ADLs, ROM, strength, pain, flexibility, decreased knowledge of precautions, decreased knowledge of use of DME, and UE functional use.  IMPAIRMENTS: are limiting patient from ADLs.    COMORBIDITIES: has co-morbidities such as arthritis  that affects occupational performance. Patient will benefit from skilled OT to address above impairments and improve overall function.  MODIFICATION OR ASSISTANCE TO COMPLETE EVALUATION: No modification of tasks or assist necessary to complete an evaluation.  OT OCCUPATIONAL PROFILE AND HISTORY: Problem focused assessment: Including review of records relating to presenting problem.  CLINICAL DECISION MAKING: Moderate - several treatment options, min-mod task modification necessary  REHAB POTENTIAL: good  EVALUATION COMPLEXITY: Moderate  PLAN:  OT FREQUENCY: 2x/week  OT DURATION: 6 weeks  PLANNED INTERVENTIONS: self care/ADL training, therapeutic exercise, therapeutic activity, manual therapy, passive range of motion, iontophoresis, moist heat, cryotherapy, contrast bath, patient/family education, and DME and/or AE instructions  RECOMMENDED OTHER SERVICES: None at this time  CONSULTED AND AGREED WITH PLAN OF CARE: Patient and family member/caregiver  PLAN FOR NEXT SESSION: see eval  Leta Speller, MS, OTR/L  Darleene Cleaver, OT 12/09/2022, 1:33 PM

## 2022-12-13 ENCOUNTER — Ambulatory Visit: Payer: Medicare HMO

## 2022-12-13 DIAGNOSIS — M25522 Pain in left elbow: Secondary | ICD-10-CM

## 2022-12-13 DIAGNOSIS — M25521 Pain in right elbow: Secondary | ICD-10-CM

## 2022-12-13 DIAGNOSIS — M6281 Muscle weakness (generalized): Secondary | ICD-10-CM | POA: Diagnosis not present

## 2022-12-13 NOTE — Therapy (Unsigned)
OUTPATIENT OCCUPATIONAL THERAPY ORTHO TREATMENT  Patient Name: Daniel Carr MRN: [id number] DOB:October 19, 1935, 87 y.o., male Today's Date: 12/13/2022  PCP: Dr. [name] REFERRING PROVIDER: Dr. [name]  END OF SESSION:    Past Medical History:  Diagnosis Date   Arthritis    childhood infection    chicken pox, measels, and mumps   Hypertension    Hypothyroidism    Sinus problem    Ulnar neuropathy    bilateral   Past Surgical History:  Procedure Laterality Date   APPENDECTOMY     CATARACT EXTRACTION Bilateral    COLONOSCOPY     JOINT REPLACEMENT Right    KNEE ARTHROPLASTY Left 12/24/2020   Procedure: COMPUTER ASSISTED TOTAL KNEE ARTHROPLASTY;  Surgeon: Dereck Leep, MD;  Location: [facility] OR[facility];  Service: Orthopedics;  Laterality: Left;  3RD CASE   KNEE SURGERY Right 10/04/2011   replacement   NM MYOVIEW ([facility] HX)  07/21/2017   [place]. No ischemia.    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   Pulmonary embolism ([place]) 01/14/2021   Total knee replacement status 37/16/9678   Systolic murmur 93/81/0175   Allergic rhinitis 02/19/2019   MGUS (monoclonal gammopathy of unknown significance) 10/20/8526   Systolic dysfunction 78/24/2353   Primary osteoarthritis of left knee 07/28/2017   Callus of foot 07/27/2016   Nail dystrophy 07/27/2016   Arthropathia 06/09/2015   Neuropathic ulnar nerve 06/09/2015   Glaucoma    Hypothyroid    Pure hypercholesterolemia    Hypertension, benign    Arthritis, multiple joint involvement    BPH (benign prostatic hyperplasia)    ED (erectile dysfunction)    Nocturia associated with benign prostatic hypertrophy     ONSET DATE: >6 months  REFERRING DIAG: Pain in both elbows  THERAPY DIAG:  No diagnosis found.  Rationale for Evaluation and Treatment: Rehabilitation  SUBJECTIVE:  SUBJECTIVE STATEMENT: Pt reports R  elbow still gets a shooting pain in it, but not as often since he's started therapy. Pt accompanied by: self and daughter, Clarene Critchley  PERTINENT HISTORY: Per chart: Bilateral elbow tendonitis On 10/26/22 pt seen by PCP and was given naproxen '500mg'$  twice daily for 14 days. Plan was to call for orthopedic referral if not much better when finished.  After 2 weeks, pain was improved but still present.  PRECAUTIONS: None  WEIGHT BEARING RESTRICTIONS: No  PAIN:  Are you having pain? Yes: NPRS scale: 2-10/10 Pain location: bilat elbows  Pain description: sharp  Aggravating factors: pt and daughter report no activities change the way pain comes and goes  Relieving factors: Naproxen,  heat  FALLS: Has patient fallen in last 6 months? No  LIVING ENVIRONMENT: Lives with:  spouse who is bedridden, but pt has 24 hr aids who manage  all IADLs for spouse; daughter also helps often and assists with transportation  Lives in: House/apartment Stairs: No Has following equipment at home: Single point cane  PLOF: Independent with basic ADLs  PATIENT GOALS: "Get rid of this pain."  NEXT MD VISIT: nothing scheduled  OBJECTIVE:   HAND DOMINANCE: Right  ADLs: Overall ADLs: modified indep with basic ADLs using SPC; pain in elbows fluctuates with activity Transfers/ambulation related to ADLs: modified indep with SPC  IADLs: aids present in the home 24/7 d/t spouse being bedridden.  Pt's daughter, Clarene Critchley lives 4 doors down and is also over often  to assist as needed.  All IADLs are managed between aids and daughter.  FUNCTIONAL OUTCOME MEASURES: FOTO: 47, predicted 62   UPPER EXTREMITY ROM:     Active ROM Right eval  Left eval   Shoulder flexion      Shoulder abduction      Shoulder adduction      Shoulder extension      Shoulder internal rotation      Shoulder external rotation      Elbow flexion 137 135 135 135  Elbow extension -23 -20 -25 -23  Wrist flexion 78 78 90 88  Wrist extension 68  -80 70 70  Wrist ulnar deviation      Wrist radial deviation      Wrist pronation 90 90 90 90  Wrist supination 78 83 80 80  (Blank rows = not tested)   UPPER EXTREMITY MMT:     MMT Right eval  Left eval   Shoulder flexion      Shoulder abduction      Shoulder adduction      Shoulder extension      Shoulder internal rotation      Shoulder external rotation      Middle trapezius      Lower trapezius      Elbow flexion 4+ 4+ 4+ 4+  Elbow extension 3+  (limited by pain) '4 4 4  '$ Wrist flexion 4+ 5    Wrist extension 5 5    Wrist ulnar deviation      Wrist radial deviation      (Blank rows = not tested)  HAND FUNCTION: Grip strength: Right: 34 lbs; Left: 26 lbs, Lateral pinch: Right: 13 lbs, Left: 9 lbs, and 3 point pinch: Right: 13 lbs, Left: 10 lbs Grip strength: Right: 25 lbs; Left: 27 lbs, Lateral pinch: Right: 11 lbs, Left: 9 lbs, and 3 point pinch: Right: 10 lbs, Left: 12 lbs  COORDINATION: Limited d/t severe arthritic deformities; see below in observations.    SENSATION: Pt reports hx of neuropathy in bilat hands and feet.  EDEMA: No visible edema, only arthritic joint deformities in elbows and hands bilaterally  COGNITION: Overall cognitive status: Within functional limits for tasks assessed  OBSERVATIONS: Pt has severe arthritic deformities in bilat hands, including severe ulnar drift in digits 2-5 of both hands, R hand flexion contractures in the MCP joints of digits 2-5, swan neck deformity in the R 5th digit, and swan neck deformities in the L hand digits 3-5.    TODAY'S TREATMENT:   Iontophoresis: 2cc Dexamethasone placed on R medial epicondyle, 40 mA/min, 2.0 current; 20 min tx time, working to reduce inflammation at medial elbow.             Manual Therapy: Soft tissue massage performed to L medial and lateral elbow, as well as bicep insertion, volar elbow crease where pt notes pain and tightness, and dorsal forearm/wrist extensors, working to decrease  pain, reduce inflammation, and increase tissue extensibility for greater ROM.  Soft tissue massage to R medial and lateral elbow, bicep/tricep insertion, and dorsal forearm/wrist extensors for same.   Therapeutic Exercise: Performed gentle passive elbow flex/ext to end range bilaterally, and wrist flex/ext with extended elbow with 20 sec hold.  Instructed pt in ulnar nerve gliding for R/LUEs d/t pt reporting intermittent shooting pain through the elbow.  Pt requiring min tactile cues with each rep for correct form/positioning.  Pt completed 5 reps on each side.  OT issued written handout for  ulnar nerve gliding.  Instructed pt in correct donning of elbow straps, just below elbows bilaterally and velcro should be pulled snug.  Daughter present for demo and both verbalized understanding.    PATIENT EDUCATION: Education details: benefits of elbow straps Person educated: Patient and Child(ren) Education method: Explanation Education comprehension: verbalized understanding and needs further education  HOME EXERCISE PROGRAM: PROM to bilat elbows/wrists  GOALS: Goals reviewed with patient? Yes  SHORT TERM GOALS: Target date: 12/20/22 (4 weeks)  Pt will be indep with HEP for improving BUE strength and flexibility. Baseline: No yet established Goal status: INITIAL  2.  Pt will be indep to verbalize 2-3 joint protection strategies for BUEs to minimize pain and reduce additional cumulative trauma injury. Baseline: Educ not yet provided Goal status: INITIAL  LONG TERM GOALS: Target date: 01/03/23 (6 weeks)  Pt will increase FOTO score to 55 or better to demonstrate clinically relevant improvement to performance of daily tasks.  Baseline: 47 (predicted 62) Goal status: INITIAL  2.  Pt will tolerate modalities, manual therapy, and therapeutic exercises to minimize pain in BUEs, consistently reporting <4/10 pain in BUEs with activity. Baseline: Pt reports large fluctuation in pain, ranging from  2-10/10 pain with activity in bilat elbows. Goal status: INITIAL  3.  Pt will increase R/L elbow extension strength by 1/2 grade or more to more easily push self up from arm rests of a chair. Baseline: R elbow ext 3+, L 4 Goal status: INITIAL  4.  Pt will be indep to open jars in kitchen with adapted equipment as needed.  Baseline: Unable to open jars Goal status: INITIAL  ASSESSMENT:  CLINICAL IMPRESSION: Good tolerance to iontophoresis this date.  Pt was not tender to the lateral or medial elbow today on the R arm, and stated that his pain was more like a shooting through the elbow but not consistent, and has been less frequent since starting therapy.  OT reviewed self PROM for elbows and wrists with good return demo, and instructed today in ulnar nerve glides bilaterally d/t reports of shooting pain through the elbow.  Pt able to return demo with mod tactile cues for form/technique.  Handout issued for this new exercise.  Pt required education on donning elbow straps, as pt had them loosely around his wrists.  Daughter present for demo and acknowledged proper placement to be just distal to elbows and should fit snug.   Pt will benefit from continued skilled OT to address bilat elbow pain with educ on cumulative trauma injury prevention, joint protection strategies, instruction in HEP, manual therapy to provide STM, and iontophoresis with Dexamethasone in order to reduce pain and inflammation in BUEs for better tolerance to self care tasks.   PERFORMANCE DEFICITS: in functional skills including ADLs, ROM, strength, pain, flexibility, decreased knowledge of precautions, decreased knowledge of use of DME, and UE functional use.  IMPAIRMENTS: are limiting patient from ADLs.   COMORBIDITIES: has co-morbidities such as arthritis  that affects occupational performance. Patient will benefit from skilled OT to address above impairments and improve overall function.  MODIFICATION OR ASSISTANCE TO  COMPLETE EVALUATION: No modification of tasks or assist necessary to complete an evaluation.  OT OCCUPATIONAL PROFILE AND HISTORY: Problem focused assessment: Including review of records relating to presenting problem.  CLINICAL DECISION MAKING: Moderate - several treatment options, min-mod task modification necessary  REHAB POTENTIAL: good  EVALUATION COMPLEXITY: Moderate      PLAN:  OT FREQUENCY: 2x/week  OT DURATION: 6 weeks  PLANNED  INTERVENTIONS: self care/ADL training, therapeutic exercise, therapeutic activity, manual therapy, passive range of motion, iontophoresis, moist heat, cryotherapy, contrast bath, patient/family education, and DME and/or AE instructions  RECOMMENDED OTHER SERVICES: None at this time  CONSULTED AND AGREED WITH PLAN OF CARE: Patient and family member/caregiver  PLAN FOR NEXT SESSION: see eval  Leta Speller, MS, OTR/L  Darleene Cleaver, OT 12/13/2022, 1:21 PM

## 2022-12-15 ENCOUNTER — Ambulatory Visit: Payer: Medicare HMO

## 2022-12-22 ENCOUNTER — Ambulatory Visit: Payer: Medicare HMO

## 2022-12-23 ENCOUNTER — Other Ambulatory Visit: Payer: Self-pay | Admitting: Family Medicine

## 2022-12-23 DIAGNOSIS — E039 Hypothyroidism, unspecified: Secondary | ICD-10-CM

## 2022-12-23 DIAGNOSIS — N401 Enlarged prostate with lower urinary tract symptoms: Secondary | ICD-10-CM

## 2022-12-23 DIAGNOSIS — Z96653 Presence of artificial knee joint, bilateral: Secondary | ICD-10-CM | POA: Diagnosis not present

## 2022-12-28 ENCOUNTER — Ambulatory Visit: Payer: Medicare HMO

## 2023-01-24 ENCOUNTER — Ambulatory Visit
Admission: RE | Admit: 2023-01-24 | Discharge: 2023-01-24 | Disposition: A | Discharge status: Home / Self Care | Payer: Medicare HMO | Attending: Family Medicine | Admitting: Family Medicine

## 2023-01-24 ENCOUNTER — Ambulatory Visit (INDEPENDENT_AMBULATORY_CARE_PROVIDER_SITE_OTHER): Payer: Medicare HMO | Admitting: Family Medicine

## 2023-01-24 ENCOUNTER — Encounter: Payer: Self-pay | Admitting: Family Medicine

## 2023-01-24 ENCOUNTER — Ambulatory Visit
Admission: RE | Admit: 2023-01-24 | Discharge: 2023-01-24 | Disposition: A | Discharge status: Home / Self Care | Payer: Medicare HMO | Source: Ambulatory Visit | Attending: Family Medicine | Admitting: Family Medicine

## 2023-01-24 VITALS — BP 118/70 | HR 83 | Temp 98.1°F | Wt 218.0 lb

## 2023-01-24 DIAGNOSIS — M25521 Pain in right elbow: Secondary | ICD-10-CM | POA: Diagnosis not present

## 2023-01-24 MED ORDER — HYDROCODONE-ACETAMINOPHEN 5-325 MG PO TABS: 1.0000 | ORAL_TABLET | Freq: Four times a day (QID) | ORAL | 0 refills | Status: DC | PRN

## 2023-01-24 NOTE — Progress Notes (Signed)
Established patient visit   Patient: Daniel Carr   DOB: [birth year]   88 y.o. Male  MRN: [id number] Visit Date: 01/24/2023  Today's healthcare provider: Lelon Huh, MD   No chief complaint on file.  Subjective    HPI  Patient reports worsening elbow pain for many years, but becoming nearly unbearable over the last year. Right is worse than left, often keeps him up at night, but having difficulty performing activities of daily living including driving due to pain. No known injury.   Medications: Outpatient Medications Prior to Visit  Medication Sig   amLODipine (NORVASC) 2.5 MG tablet Take 1 tablet by mouth once daily   aspirin 81 MG EC tablet Take 1 tablet (81 mg total) by mouth daily. Swallow whole.   azelastine (ASTELIN) 0.1 % nasal spray Place 1 spray into the nose daily as needed (allergies).   brimonidine (ALPHAGAN) 0.2 % ophthalmic solution Place 1 drop into both eyes in the morning and at bedtime.   clotrimazole-betamethasone (LOTRISONE) cream APPLY TOPICALLY ONCE DAILY   COD LIVER OIL PO Take 1 capsule by mouth daily.   dorzolamide (TRUSOPT) 2 % ophthalmic solution Apply to eye.   dutasteride (AVODART) 0.5 MG capsule Take 1 capsule by mouth once daily   enalapril-hydrochlorothiazide (VASERETIC) 10-25 MG tablet hydroCHLOROthiazide '25mg'$    hydrochlorothiazide (HYDRODIURIL) 25 MG tablet TAKE ONE-HALF TABLET BY MOUTH ONCE DAILY   ipratropium (ATROVENT) 0.06 % nasal spray Place 1 spray into both nostrils 2 (two) times daily as needed for rhinitis.   latanoprost (XALATAN) 0.005 % ophthalmic solution SMARTSIG:In Eye(s)   levocetirizine (XYZAL) 5 MG tablet Take 5 mg by mouth every evening.   levothyroxine (SYNTHROID) 150 MCG tablet Take 1 tablet by mouth once daily   lovastatin (MEVACOR) 40 MG tablet Take 1 tablet by mouth once daily   Multiple Vitamin (DAILY VITAMIN PO) Take 1 tablet by mouth daily.   naproxen (NAPROSYN) 500 MG tablet Take 1 tablet (500 mg total) by mouth  2 (two) times daily with a meal.   [DISCONTINUED] Alpha-Lipoic Acid 100 MG CAPS Take by mouth.   [DISCONTINUED] STIOLTO RESPIMAT 2.5-2.5 MCG/ACT AERS 2 puffs daily. (Patient not taking: Reported on 10/26/2022)   [DISCONTINUED] traMADol (ULTRAM) 50 MG tablet Take 1 tablet (50 mg total) by mouth every 4 (four) hours as needed for moderate pain.   No facility-administered medications prior to visit.    Review of Systems  Respiratory: Negative.  Negative for cough, shortness of breath and wheezing.   Cardiovascular:  Negative for chest pain, palpitations and leg swelling.  Neurological:  Negative for weakness and headaches.       Objective    BP 118/70 (BP Location: Left Arm, Patient Position: Sitting, Cuff Size: Normal)   Pulse 83   Temp 98.1 F (36.7 C)   Wt 218 lb (98.9 kg)   SpO2 98%   BMI 35.19 kg/m    Physical Exam   Tender along joint lines of both elbows. No swelling. Pain with elbow flexion and extension.    Assessment & Plan     1. Right elbow pain Chronic, now progressed to point is affecting sleep and other ADLs.  - HYDROcodone-acetaminophen (NORCO/VICODIN) 5-325 MG tablet; Take 1 tablet by mouth every 6 (six) hours as needed for moderate pain.  Dispense: 20 tablet; Refill: 0 - DG Elbow Complete Right; Future   Consider rheumatology referral.      The entirety of the information documented in the History of Present  Illness, Review of Systems and Physical Exam were personally obtained by me. Portions of this information were initially documented by the CMA and reviewed by me for thoroughness and accuracy.     Lelon Huh, MD  [place] [phone] (phone) [phone] (fax)  [place]

## 2023-01-25 ENCOUNTER — Other Ambulatory Visit: Payer: Self-pay | Admitting: Family Medicine

## 2023-01-25 DIAGNOSIS — M19021 Primary osteoarthritis, right elbow: Secondary | ICD-10-CM

## 2023-02-25 DIAGNOSIS — H401133 Primary open-angle glaucoma, bilateral, severe stage: Secondary | ICD-10-CM | POA: Diagnosis not present

## 2023-03-02 ENCOUNTER — Ambulatory Visit
Admission: EM | Admit: 2023-03-02 | Discharge: 2023-03-02 | Disposition: A | Discharge status: Home / Self Care | Payer: Medicare HMO | Attending: Urgent Care | Admitting: Urgent Care

## 2023-03-02 ENCOUNTER — Ambulatory Visit (INDEPENDENT_AMBULATORY_CARE_PROVIDER_SITE_OTHER): Payer: Medicare HMO

## 2023-03-02 DIAGNOSIS — R0602 Shortness of breath: Secondary | ICD-10-CM

## 2023-03-02 NOTE — Discharge Instructions (Signed)
Follow-up with an evaluation by your pulmonary care provider.

## 2023-03-02 NOTE — ED Triage Notes (Signed)
SOB, labored breathing, that has been going on for a few months that has been getting worse, with swelling in his legs.

## 2023-03-02 NOTE — ED Provider Notes (Signed)
Daniel Carr    CSN: [id number] Arrival date & time: 03/02/23  1141      History   Chief Complaint Chief Complaint  Patient presents with   Shortness of Breath    HPI Daniel Carr is a 88 y.o. male.    Shortness of Breath   Patient presents to urgent care with complaint of shortness of breath, labored breathing, going on for a few months and worsening.  Swelling in his legs.  Patient of [facility] in [place] and treated for hemidiaphragm paralysis.  Referral to [facility] pulmonary rehab was ordered in December 2023. 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   Pulmonary embolism ([place]) 01/14/2021   Total knee replacement status [id number]   Systolic murmur 0000000   Allergic rhinitis 02/19/2019   MGUS (monoclonal gammopathy of unknown significance) Q000111Q   Systolic dysfunction Q000111Q   Primary osteoarthritis of left knee 07/28/2017   Callus of foot 07/27/2016   Nail dystrophy 07/27/2016   Arthropathia 06/09/2015   Neuropathic ulnar nerve 06/09/2015   Glaucoma    Hypothyroid    Pure hypercholesterolemia    Hypertension, benign    Arthritis, multiple joint involvement    BPH (benign prostatic hyperplasia)    ED (erectile dysfunction)    Nocturia associated with benign prostatic hypertrophy     Past Surgical History:  Procedure Laterality Date   APPENDECTOMY     CATARACT EXTRACTION Bilateral    COLONOSCOPY     JOINT REPLACEMENT Right    KNEE ARTHROPLASTY Left 12/24/2020   Procedure: COMPUTER ASSISTED TOTAL KNEE ARTHROPLASTY;  Surgeon: Dereck Leep, MD;  Location: [facility] ORS;  Service: Orthopedics;  Laterality: Left;  3RD CASE   KNEE SURGERY Right 10/04/2011   replacement   NM  MYOVIEW ([facility] HX)  07/21/2017   [facility]. No ischemia.    RETINAL DETACHMENT REPAIR W/ SCLERAL BUCKLE LE     TONSILLECTOMY         Home Medications    Prior to Admission medications   Medication Sig Start Date End Date Taking? Authorizing Provider  amLODipine (NORVASC) 2.5 MG tablet Take 1 tablet by mouth once daily 06/30/22  Yes [name], [name], MD  aspirin 81 MG EC tablet Take 1 tablet (81 mg total) by mouth daily. Swallow whole. 07/06/21  Yes [name], MD  azelastine (ASTELIN) 0.1 % nasal spray Place 1 spray into the nose daily as needed (allergies). 11/09/19  Yes [provider]  brimonidine (ALPHAGAN) 0.2 % ophthalmic solution Place 1 drop into both eyes in the morning and at bedtime. 03/26/15  Yes [provider]  dorzolamide (TRUSOPT) 2 % ophthalmic solution Apply to eye. 11/08/21  Yes [provider]  dutasteride (AVODART) 0.5 MG capsule Take 1 capsule by mouth once daily 12/23/22  Yes [name], [name], MD  enalapril-hydrochlorothiazide (VASERETIC) 10-25 MG tablet hydroCHLOROthiazide '25mg'$    Yes [provider]  hydrochlorothiazide (HYDRODIURIL) 25 MG tablet TAKE ONE-HALF TABLET BY MOUTH ONCE DAILY 11/22/22  Yes [name], MD  ipratropium (ATROVENT) 0.06 % nasal spray Place 1 spray into both nostrils 2 (two) times daily as needed for rhinitis. 03/15/18  Yes [provider]  latanoprost (XALATAN) 0.005 % ophthalmic solution SMARTSIG:In Eye(s) 03/25/22  Yes [provider]  levothyroxine (SYNTHROID) 150 MCG tablet Take 1 tablet by mouth once daily 12/23/22  Yes [name], MD  lovastatin (MEVACOR) 40 MG tablet Take 1 tablet by mouth once daily 12/31/21  Yes [name], [name], MD  Multiple Vitamin (DAILY VITAMIN PO) Take 1 tablet by mouth daily.   Yes [provider]  clotrimazole-betamethasone (LOTRISONE) cream APPLY TOPICALLY ONCE DAILY 04/05/22   [name], MD  COD LIVER OIL PO Take 1 capsule by mouth daily.     [provider]  HYDROcodone-acetaminophen (NORCO/VICODIN) 5-325 MG tablet Take 1 tablet by mouth every 6 (six) hours as needed for moderate pain. 01/24/23   [name], MD  levocetirizine (XYZAL) 5 MG tablet Take 5 mg by mouth every evening.    [provider]  naproxen (NAPROSYN) 500 MG tablet Take 1 tablet (500 mg total) by mouth 2 (two) times daily with a meal. 11/12/22   [name], [name], MD    Family History Family History  Problem Relation Age of Onset   Stroke Sister    Hypertension Mother    Stroke Father    Hypertension Father     Social History Social History   Tobacco Use   Smoking status: Former    Packs/day: 0.25    Years: 6.00    Total pack years: 1.50    Types: Cigarettes    Quit date: 11/13/1969    Years since quitting: 53.3   Smokeless tobacco: Never   Tobacco comments:    quit in 1970's  Vaping Use   Vaping Use: Never used  Substance Use Topics   Alcohol use: No    Alcohol/week: 0.0 standard drinks of alcohol   Drug use: No     Allergies   Patient has no known allergies.   Review of Systems Review of Systems  Respiratory:  Positive for shortness of breath.      Physical Exam Triage Vital Signs ED Triage Vitals  Enc Vitals Group     BP 03/02/23 1250 (!) 150/78     Pulse Rate 03/02/23 1250 75     Resp 03/02/23 1250 18     Temp 03/02/23 1250 98.1 F (36.7 C)     Temp Source 03/02/23 1250 Oral     SpO2 03/02/23 1250 95 %     Weight --      Height --      Head Circumference --      Peak Flow --      Pain Score 03/02/23 1304 0     Pain Loc --      Pain Edu? --      Excl. in [place]? --    No data found.  Updated Vital Signs BP (!) 150/78 (BP Location: Left Arm)   Pulse 75   Temp 98.1 F (36.7 C) (Oral)   Resp 18   SpO2 95%   Visual Acuity Right Eye Distance:   Left Eye Distance:   Bilateral Distance:    Right Eye Near:   Left Eye Near:    Bilateral Near:     Physical Exam Vitals reviewed.   Constitutional:      General: He is not in acute distress.    Appearance: He is well-developed.  Skin:    General: Skin is warm and dry.  Neurological:     General: No focal deficit present.  Mental Status: He is alert and oriented to person, place, and time.  Psychiatric:        Mood and Affect: Mood normal.        Behavior: Behavior normal.      UC Treatments / Results  Labs (all labs ordered are listed, but only abnormal results are displayed) Labs Reviewed - No data to display  EKG   Radiology DG Chest 2 View  Result Date: 03/02/2023 CLINICAL DATA:  Shortness of breath for 2 years. EXAM: CHEST - 2 VIEW COMPARISON:  X-ray 01/31/2019 and older FINDINGS: Hyperinflation. Stable elevation of the left hemidiaphragm. Apical pleural thickening on the right. No consolidation, pneumothorax or effusion. Enlarged cardiopericardial silhouette. Degenerative changes of the spine on lateral view. IMPRESSION: Hyperinflation. Stable elevation of the left hemidiaphragm. Chronic changes Electronically Signed   By: Jill Side M.D.   On: 03/02/2023 13:11    Procedures Procedures (including critical care time)  Medications Ordered in UC Medications - No data to display  Initial Impression / Assessment and Plan / UC Course  I have reviewed the triage vital signs and the nursing notes.  Pertinent labs & imaging results that were available during my care of the patient were reviewed by me and considered in my medical decision making (see chart for details).  Provider called to window during patient registration given reported symptoms of shortness of breath and history of PE.  Provider recommended evaluation in the ED rather than urgent care given our limited imaging capabilities.  Patient's daughter decided to stay for UC evaluation.   Patient is afebrile here without recent antipyretics. Satting well on room air. Overall is well appearing, well hydrated, without respiratory distress.    Chest x-ray indicates:  Hyperinflation. Stable elevation of the left hemidiaphragm. Chronic changes.    Commended follow-up with his pulmonologist.  Also recommended use of compression stockings for bilateral lower extremity edema.  Urged them to watch for worsening symptoms and go to the ED for immediate evaluation.  Final Clinical Impressions(s) / UC Diagnoses   Final diagnoses:  SOB (shortness of breath)   Discharge Instructions   None    ED Prescriptions   None    PDMP not reviewed this encounter.   Rose Phi, [place] 03/02/23 1338

## 2023-03-14 DIAGNOSIS — J449 Chronic obstructive pulmonary disease, unspecified: Secondary | ICD-10-CM | POA: Diagnosis not present

## 2023-03-14 DIAGNOSIS — J309 Allergic rhinitis, unspecified: Secondary | ICD-10-CM | POA: Diagnosis not present

## 2023-03-14 DIAGNOSIS — I10 Essential (primary) hypertension: Secondary | ICD-10-CM | POA: Diagnosis not present

## 2023-03-21 ENCOUNTER — Telehealth (HOSPITAL_COMMUNITY): Payer: Self-pay

## 2023-03-21 ENCOUNTER — Encounter: Payer: Medicare HMO | Attending: Family Medicine | Admitting: *Deleted

## 2023-03-21 DIAGNOSIS — Z117 Encounter for testing for latent tuberculosis infection: Secondary | ICD-10-CM | POA: Diagnosis not present

## 2023-03-21 DIAGNOSIS — R0602 Shortness of breath: Secondary | ICD-10-CM | POA: Diagnosis not present

## 2023-03-21 DIAGNOSIS — M20031 Swan-neck deformity of right finger(s): Secondary | ICD-10-CM | POA: Diagnosis not present

## 2023-03-21 DIAGNOSIS — M20032 Swan-neck deformity of left finger(s): Secondary | ICD-10-CM | POA: Diagnosis not present

## 2023-03-21 DIAGNOSIS — M19041 Primary osteoarthritis, right hand: Secondary | ICD-10-CM | POA: Diagnosis not present

## 2023-03-21 DIAGNOSIS — M67971 Unspecified disorder of synovium and tendon, right ankle and foot: Secondary | ICD-10-CM | POA: Diagnosis not present

## 2023-03-21 DIAGNOSIS — M19022 Primary osteoarthritis, left elbow: Secondary | ICD-10-CM | POA: Diagnosis not present

## 2023-03-21 DIAGNOSIS — J986 Disorders of diaphragm: Secondary | ICD-10-CM | POA: Diagnosis not present

## 2023-03-21 DIAGNOSIS — M79671 Pain in right foot: Secondary | ICD-10-CM | POA: Diagnosis not present

## 2023-03-21 DIAGNOSIS — J449 Chronic obstructive pulmonary disease, unspecified: Secondary | ICD-10-CM

## 2023-03-21 DIAGNOSIS — M25521 Pain in right elbow: Secondary | ICD-10-CM | POA: Diagnosis not present

## 2023-03-21 DIAGNOSIS — M19042 Primary osteoarthritis, left hand: Secondary | ICD-10-CM | POA: Diagnosis not present

## 2023-03-21 DIAGNOSIS — Z1159 Encounter for screening for other viral diseases: Secondary | ICD-10-CM | POA: Diagnosis not present

## 2023-03-21 DIAGNOSIS — M79672 Pain in left foot: Secondary | ICD-10-CM | POA: Diagnosis not present

## 2023-03-21 DIAGNOSIS — M25522 Pain in left elbow: Secondary | ICD-10-CM | POA: Diagnosis not present

## 2023-03-21 DIAGNOSIS — M19021 Primary osteoarthritis, right elbow: Secondary | ICD-10-CM | POA: Diagnosis not present

## 2023-03-21 DIAGNOSIS — M21949 Unspecified acquired deformity of hand, unspecified hand: Secondary | ICD-10-CM | POA: Diagnosis not present

## 2023-03-21 NOTE — Telephone Encounter (Signed)
Called and spoke with [name] pt daughter, she stated pt is going to do Pulmonary rehab at [facility] and was on the phone sitting it up at that time.

## 2023-03-21 NOTE — Progress Notes (Signed)
Virtual orientation call completed today. he has an appointment on Date: 04/04/2023  for EP eval and gym Orientation.  Documentation of diagnosis can be found in [facility] Date: [id number] .

## 2023-03-21 NOTE — Telephone Encounter (Signed)
Outside/paper referral received from Dr. Leron Croak. Will fax over Physician order and request further documents. Insurance benefits and eligibility to be determined. Pulmonary rehab

## 2023-03-23 DIAGNOSIS — I2693 Single subsegmental pulmonary embolism without acute cor pulmonale: Secondary | ICD-10-CM | POA: Diagnosis not present

## 2023-03-23 DIAGNOSIS — I10 Essential (primary) hypertension: Secondary | ICD-10-CM | POA: Diagnosis not present

## 2023-03-23 DIAGNOSIS — I2510 Atherosclerotic heart disease of native coronary artery without angina pectoris: Secondary | ICD-10-CM | POA: Diagnosis not present

## 2023-03-23 DIAGNOSIS — J9601 Acute respiratory failure with hypoxia: Secondary | ICD-10-CM | POA: Diagnosis not present

## 2023-03-23 DIAGNOSIS — G894 Chronic pain syndrome: Secondary | ICD-10-CM | POA: Diagnosis not present

## 2023-03-23 DIAGNOSIS — M25473 Effusion, unspecified ankle: Secondary | ICD-10-CM | POA: Diagnosis not present

## 2023-03-23 DIAGNOSIS — E7800 Pure hypercholesterolemia, unspecified: Secondary | ICD-10-CM | POA: Diagnosis not present

## 2023-03-23 DIAGNOSIS — E785 Hyperlipidemia, unspecified: Secondary | ICD-10-CM | POA: Diagnosis not present

## 2023-03-23 DIAGNOSIS — Z87891 Personal history of nicotine dependence: Secondary | ICD-10-CM | POA: Diagnosis not present

## 2023-03-23 DIAGNOSIS — J42 Unspecified chronic bronchitis: Secondary | ICD-10-CM | POA: Diagnosis not present

## 2023-03-23 DIAGNOSIS — M1990 Unspecified osteoarthritis, unspecified site: Secondary | ICD-10-CM | POA: Diagnosis not present

## 2023-03-23 DIAGNOSIS — E039 Hypothyroidism, unspecified: Secondary | ICD-10-CM | POA: Diagnosis not present

## 2023-03-24 ENCOUNTER — Other Ambulatory Visit: Payer: Self-pay | Admitting: Internal Medicine

## 2023-03-24 DIAGNOSIS — I10 Essential (primary) hypertension: Secondary | ICD-10-CM

## 2023-03-24 DIAGNOSIS — R0602 Shortness of breath: Secondary | ICD-10-CM

## 2023-04-04 ENCOUNTER — Encounter: Payer: Medicare HMO | Attending: Family Medicine | Admitting: *Deleted

## 2023-04-04 VITALS — Ht 72.0 in | Wt 210.1 lb

## 2023-04-04 DIAGNOSIS — J449 Chronic obstructive pulmonary disease, unspecified: Secondary | ICD-10-CM | POA: Insufficient documentation

## 2023-04-04 DIAGNOSIS — J986 Disorders of diaphragm: Secondary | ICD-10-CM | POA: Insufficient documentation

## 2023-04-04 NOTE — Progress Notes (Signed)
Pulmonary Individual Treatment Plan  Patient Details  Name: Daniel Carr MRN: [id number] Date of Birth: 12/04/1935 Referring Provider:   Flowsheet Row [facility]  Referring Provider [name], [name] MD       Initial Encounter Date:  Flowsheet Row [facility]  Date 04/04/23       Visit Diagnosis: Stage 3 severe COPD by GOLD classification  Diaphragm paralysis  Patient's Home Medications on Admission:  Current Outpatient Medications:    amLODipine (NORVASC) 2.5 MG tablet, Take 1 tablet by mouth once daily, Disp: 90 tablet, Rfl: 4   aspirin 81 MG EC tablet, Take 1 tablet (81 mg total) by mouth daily. Swallow whole., Disp: , Rfl:    azelastine (ASTELIN) 0.1 % nasal spray, Place 1 spray into the nose daily as needed (allergies)., Disp: , Rfl:    brimonidine (ALPHAGAN) 0.2 % ophthalmic solution, Place 1 drop into both eyes in the morning and at bedtime., Disp: , Rfl:    clotrimazole-betamethasone (LOTRISONE) cream, APPLY TOPICALLY ONCE DAILY (Patient not taking: Reported on 03/21/2023), Disp: 15 g, Rfl: 0   COD LIVER OIL PO, Take 1 capsule by mouth daily. (Patient not taking: Reported on 03/21/2023), Disp: , Rfl:    dorzolamide (TRUSOPT) 2 % ophthalmic solution, Apply to eye., Disp: , Rfl:    dutasteride (AVODART) 0.5 MG capsule, Take 1 capsule by mouth once daily, Disp: 90 capsule, Rfl: 4   enalapril-hydrochlorothiazide (VASERETIC) 10-25 MG tablet, hydroCHLOROthiazide 25mg  (Patient not taking: Reported on 03/21/2023), Disp: , Rfl:    hydrochlorothiazide (HYDRODIURIL) 25 MG tablet, TAKE ONE-HALF TABLET BY MOUTH ONCE DAILY, Disp: 45 tablet, Rfl: 03   ipratropium (ATROVENT) 0.06 % nasal spray, Place 1 spray into both nostrils 2 (two) times daily as needed for rhinitis., Disp: , Rfl: 6   latanoprost (XALATAN) 0.005 % ophthalmic solution, SMARTSIG:In Eye(s), Disp: , Rfl:     levocetirizine (XYZAL) 5 MG tablet, Take 5 mg by mouth every evening., Disp: , Rfl:    levothyroxine (SYNTHROID) 150 MCG tablet, Take 1 tablet by mouth once daily, Disp: 90 tablet, Rfl: 4   lovastatin (MEVACOR) 40 MG tablet, Take 1 tablet by mouth once daily, Disp: 90 tablet, Rfl: 5   Multiple Vitamin (DAILY VITAMIN PO), Take 1 tablet by mouth daily., Disp: , Rfl:    naproxen (NAPROSYN) 500 MG tablet, Take 1 tablet (500 mg total) by mouth 2 (two) times daily with a meal. (Patient not taking: Reported on 03/21/2023), Disp: 30 tablet, Rfl: 1   predniSONE (DELTASONE) 5 MG tablet, Take by mouth., Disp: , Rfl:    traMADol (ULTRAM) 50 MG tablet, Take 50 mg by mouth every 6 (six) hours as needed., Disp: , Rfl:    vitamin B-12 (CYANOCOBALAMIN) 100 MCG tablet, Take 100 mcg by mouth daily. Unsure of dose  is OTC med, Disp: , Rfl:   Past Medical History: Past Medical History:  Diagnosis Date   Arthritis    childhood infection    chicken pox, measels, and mumps   Hypertension    Hypothyroidism    Sinus problem    Ulnar neuropathy    bilateral    Tobacco Use: Social History   Tobacco Use  Smoking Status Former   Packs/day: 0.25   Years: 6.00   Additional pack years: 0.00   Total pack years: 1.50   Types: Cigarettes   Quit date: 11/13/1969   Years since quitting: 3253.4  Smokeless Tobacco Never  Tobacco Comments   quit in 1970's    Labs: Review Flowsheet  More data exists      Latest Ref Rng & Units 06/11/2019 07/22/2020 07/06/2021 03/29/2022 10/26/2022  Labs for ITP Cardiac and Pulmonary Rehab  Cholestrol 100 - 199 mg/dL 161  096  045  409  811   LDL (calc) 0 - 99 mg/dL 76  64  74  66  71   HDL-C >39 mg/dL 65  68  76  81  90   Trlycerides 0 - 149 mg/dL 76  89  61  60  57      Pulmonary Assessment Scores:  Pulmonary Assessment Scores     Row Name 04/04/23 1543         ADL UCSD   ADL Phase Entry     SOB Score total 72     Rest 3     Walk 4     Stairs 4     Bath 2     Dress  2     Shop 3       CAT Score   CAT Score 17       mMRC Score   mMRC Score 4              UCSD: Self-administered rating of dyspnea associated with activities of daily living (ADLs) 6-point scale (0 = "not at all" to 5 = "maximal or unable to do because of breathlessness")  Scoring Scores range from 0 to 120.  Minimally important difference is 5 units  CAT: CAT can identify the health impairment of COPD patients and is better correlated with disease progression.  CAT has a scoring range of zero to 40. The CAT score is classified into four groups of low (less than 10), medium (10 - 20), high (21-30) and very high (31-40) based on the impact level of disease on health status. A CAT score over 10 suggests significant symptoms.  A worsening CAT score could be explained by an exacerbation, poor medication adherence, poor inhaler technique, or progression of COPD or comorbid conditions.  CAT MCID is 2 points  mMRC: mMRC (Modified Medical Research Council) Dyspnea Scale is used to assess the degree of baseline functional disability in patients of respiratory disease due to dyspnea. No minimal important difference is established. A decrease in score of 1 point or greater is considered a positive change.   Pulmonary Function Assessment:  Pulmonary Function Assessment - 04/04/23 1543       Breath   Shortness of Breath Yes;Limiting activity             Exercise Target Goals: Exercise Program Goal: Individual exercise prescription set using results from initial 6 min walk test and THRR while considering  patient's activity barriers and safety.   Exercise Prescription Goal: Initial exercise prescription builds to 30-45 minutes a day of aerobic activity, 2-3 days per week.  Home exercise guidelines will be given to patient during program as part of exercise prescription that the participant will acknowledge.  Education: Aerobic Exercise: - Group verbal and visual presentation on the  components of exercise prescription. Introduces F.I.T.T principle from ACSM for exercise prescriptions.  Reviews F.I.T.T. principles of aerobic exercise including progression. Written material given at graduation.   Education: Resistance Exercise: - Group verbal and visual presentation on the components of exercise prescription. Introduces F.I.T.T principle from ACSM for exercise prescriptions  Reviews F.I.T.T. principles of resistance exercise including progression. Written material given  at graduation.    Education: Exercise & Equipment Safety: - Individual verbal instruction and demonstration of equipment use and safety with use of the equipment. Flowsheet Row Pulmonary Rehab from 04/04/2023 in [facility] Cardiac and Pulmonary Rehab  Date 04/04/23  Educator [facility]  Instruction Review Code 1- Verbalizes Understanding       Education: Exercise Physiology & General Exercise Guidelines: - Group verbal and written instruction with models to review the exercise physiology of the cardiovascular system and associated critical values. Provides general exercise guidelines with specific guidelines to those with heart or lung disease.    Education: Flexibility, Balance, Mind/Body Relaxation: - Group verbal and visual presentation with interactive activity on the components of exercise prescription. Introduces F.I.T.T principle from ACSM for exercise prescriptions. Reviews F.I.T.T. principles of flexibility and balance exercise training including progression. Also discusses the mind body connection.  Reviews various relaxation techniques to help reduce and manage stress (i.e. Deep breathing, progressive muscle relaxation, and visualization). Balance handout provided to take home. Written material given at graduation.   Activity Barriers & Risk Stratification:  Activity Barriers & Cardiac Risk Stratification - 04/04/23 1535       Activity Barriers & Cardiac Risk Stratification   Activity Barriers Joint  Problems;Right Knee Replacement;Left Knee Replacement;Arthritis;Muscular Weakness;Deconditioning;Balance Concerns;Other (comment);Shortness of Breath;History of Falls;Assistive Device    Comments Arthritis all over causing deformities in hands, neuropathy in feet             6 Minute Walk:  6 Minute Walk     Row Name 04/04/23 1533         6 Minute Walk   Phase Initial     Distance 310 feet     Walk Time 5 minutes     # of Rest Breaks 1  1 minute rest     MPH 0.7     METS 0.51     RPE 13     Perceived Dyspnea  3     VO2 Peak 1.77     Symptoms Yes (comment)     Comments fatigue, SOB, chronic foot pain     Resting HR 75 bpm     Resting BP 134/66     Resting Oxygen Saturation  94 %     Exercise Oxygen Saturation  during 6 min walk 87 %     Max Ex. HR 102 bpm     Max Ex. BP 154/72     2 Minute Post BP 138/70       Interval HR   1 Minute HR 94     2 Minute HR 98     3 Minute HR 99     4 Minute HR 96     5 Minute HR 101     6 Minute HR 102     2 Minute Post HR 79     Interval Heart Rate? Yes       Interval Oxygen   Interval Oxygen? Yes     Baseline Oxygen Saturation % 94 %     1 Minute Oxygen Saturation % 89 %     1 Minute Liters of Oxygen 0 L  Room Air     2 Minute Oxygen Saturation % 87 %     2 Minute Liters of Oxygen 0 L     3 Minute Oxygen Saturation % 84 %     3 Minute Liters of Oxygen 0 L     4 Minute Oxygen Saturation % 88 %  4 Minute Liters of Oxygen 0 L     5 Minute Oxygen Saturation % 87 %     5 Minute Liters of Oxygen 0 L     6 Minute Oxygen Saturation % 89 %     6 Minute Liters of Oxygen 0 L     2 Minute Post Oxygen Saturation % 98 %     2 Minute Post Liters of Oxygen 0 L             Oxygen Initial Assessment:  Oxygen Initial Assessment - 04/04/23 1542       Home Oxygen   Home Oxygen Device None    Sleep Oxygen Prescription None    Home Exercise Oxygen Prescription None    Home Resting Oxygen Prescription None    Compliance with  Home Oxygen Use Yes      Initial 6 min Walk   Oxygen Used None      Program Oxygen Prescription   Program Oxygen Prescription None      Intervention   Short Term Goals To learn and demonstrate proper pursed lip breathing techniques or other breathing techniques. ;To learn and understand importance of maintaining oxygen saturations>88%;To learn and demonstrate proper use of respiratory medications;To learn and understand importance of monitoring SPO2 with pulse oximeter and demonstrate accurate use of the pulse oximeter.    Long  Term Goals Exhibits proper breathing techniques, such as pursed lip breathing or other method taught during program session;Compliance with respiratory medication;Demonstrates proper use of MDI's;Maintenance of O2 saturations>88%;Verbalizes importance of monitoring SPO2 with pulse oximeter and return demonstration             Oxygen Re-Evaluation:   Oxygen Discharge (Final Oxygen Re-Evaluation):   Initial Exercise Prescription:  Initial Exercise Prescription - 04/04/23 1500       Date of Initial Exercise RX and Referring Provider   Date 04/04/23    Referring Provider Mila Merry MD      Oxygen   Maintain Oxygen Saturation 88% or higher      Recumbant Bike   Level 1    RPM 50    Watts 5    Minutes 15    METs 2      NuStep   Level 1    SPM 80    Minutes 15    METs 2      T5 Nustep   Level 1    SPM 80    Minutes 1    METs 2      Biostep-RELP   Level 1    SPM 50    Minutes 15    METs 2      Prescription Details   Frequency (times per week) 2    Duration Progress to 30 minutes of continuous aerobic without signs/symptoms of physical distress      Intensity   THRR 40-80% of Max Heartrate 99-122    Ratings of Perceived Exertion 11-13    Perceived Dyspnea 0-4      Progression   Progression Continue to progress workloads to maintain intensity without signs/symptoms of physical distress.      Resistance Training   Training  Prescription Yes    Weight 3 lb    Reps 10-15             Perform Capillary Blood Glucose checks as needed.  Exercise Prescription Changes:   Exercise Prescription Changes     Row Name 04/04/23 1500  Response to Exercise   Blood Pressure (Admit) 134/66       Blood Pressure (Exercise) 154/72       Blood Pressure (Exit) 138/70       Heart Rate (Admit) 75 bpm       Heart Rate (Exercise) 102 bpm       Heart Rate (Exit) 80 bpm       Oxygen Saturation (Admit) 94 %       Oxygen Saturation (Exercise) 87 %       Oxygen Saturation (Exit) 95 %       Rating of Perceived Exertion (Exercise) 13       Perceived Dyspnea (Exercise) 3       Symptoms SOB, chronic foot pain       Comments walk test results                Exercise Comments:   Exercise Goals and Review:   Exercise Goals     Row Name 04/04/23 1538             Exercise Goals   Increase Physical Activity Yes       Intervention Provide advice, education, support and counseling about physical activity/exercise needs.;Develop an individualized exercise prescription for aerobic and resistive training based on initial evaluation findings, risk stratification, comorbidities and participant's personal goals.       Expected Outcomes Long Term: Add in home exercise to make exercise part of routine and to increase amount of physical activity.;Long Term: Exercising regularly at least 3-5 days a week.;Short Term: Attend rehab on a regular basis to increase amount of physical activity.       Increase Strength and Stamina Yes       Intervention Provide advice, education, support and counseling about physical activity/exercise needs.;Develop an individualized exercise prescription for aerobic and resistive training based on initial evaluation findings, risk stratification, comorbidities and participant's personal goals.       Expected Outcomes Short Term: Perform resistance training exercises routinely during rehab  and add in resistance training at home;Short Term: Increase workloads from initial exercise prescription for resistance, speed, and METs.;Long Term: Improve cardiorespiratory fitness, muscular endurance and strength as measured by increased METs and functional capacity ( )       Able to understand and use rate of perceived exertion (RPE) scale Yes       Intervention Provide education and explanation on how to use RPE scale       Expected Outcomes Short Term: Able to use RPE daily in rehab to express subjective intensity level;Long Term:  Able to use RPE to guide intensity level when exercising independently       Able to understand and use Dyspnea scale Yes       Intervention Provide education and explanation on how to use Dyspnea scale       Expected Outcomes Long Term: Able to use Dyspnea scale to guide intensity level when exercising independently;Short Term: Able to use Dyspnea scale daily in rehab to express subjective sense of shortness of breath during exertion       Knowledge and understanding of Target Heart Rate Range (THRR) Yes       Intervention Provide education and explanation of THRR including how the numbers were predicted and where they are located for reference       Expected Outcomes Short Term: Able to state/look up THRR;Long Term: Able to use THRR to govern intensity when exercising independently;Short Term: Able to use daily as guideline  for intensity in rehab       Able to check pulse independently Yes       Intervention Provide education and demonstration on how to check pulse in carotid and radial arteries.;Review the importance of being able to check your own pulse for safety during independent exercise       Expected Outcomes Short Term: Able to explain why pulse checking is important during independent exercise;Long Term: Able to check pulse independently and accurately       Understanding of Exercise Prescription Yes       Intervention Provide education, explanation, and  written materials on patient's individual exercise prescription       Expected Outcomes Short Term: Able to explain program exercise prescription;Long Term: Able to explain home exercise prescription to exercise independently                Exercise Goals Re-Evaluation :   Discharge Exercise Prescription (Final Exercise Prescription Changes):  Exercise Prescription Changes - 04/04/23 1500       Response to Exercise   Blood Pressure (Admit) 134/66    Blood Pressure (Exercise) 154/72    Blood Pressure (Exit) 138/70    Heart Rate (Admit) 75 bpm    Heart Rate (Exercise) 102 bpm    Heart Rate (Exit) 80 bpm    Oxygen Saturation (Admit) 94 %    Oxygen Saturation (Exercise) 87 %    Oxygen Saturation (Exit) 95 %    Rating of Perceived Exertion (Exercise) 13    Perceived Dyspnea (Exercise) 3    Symptoms SOB, chronic foot pain    Comments walk test results             Nutrition:  Target Goals: Understanding of nutrition guidelines, daily intake of sodium 1500mg , cholesterol 200mg , calories 30% from fat and 7% or less from saturated fats, daily to have 5 or more servings of fruits and vegetables.  Education: All About Nutrition: -Group instruction provided by verbal, written material, interactive activities, discussions, models, and posters to present general guidelines for heart healthy nutrition including fat, fiber, MyPlate, the role of sodium in heart healthy nutrition, utilization of the nutrition label, and utilization of this knowledge for meal planning. Follow up email sent as well. Written material given at graduation.   Biometrics:  Pre Biometrics - 04/04/23 1539       Pre Biometrics   Height 6' (1.829 m)    Weight 210 lb 1.6 oz (95.3 kg)    BMI (Calculated) 28.49    Single Leg Stand 0.8 seconds              Nutrition Therapy Plan and Nutrition Goals:  Nutrition Therapy & Goals - 04/04/23 1540       Intervention Plan   Intervention Prescribe, educate  and counsel regarding individualized specific dietary modifications aiming towards targeted core components such as weight, hypertension, lipid management, diabetes, heart failure and other comorbidities.    Expected Outcomes Short Term Goal: Understand basic principles of dietary content, such as calories, fat, sodium, cholesterol and nutrients.             Nutrition Assessments:  MEDIFICTS Score Key: ?70 Need to make dietary changes  40-70 Heart Healthy Diet ? 40 Therapeutic Level Cholesterol Diet  Flowsheet Row Pulmonary Rehab from 04/04/2023 in [facility] Cardiac and Pulmonary Rehab  Picture Your Plate Total Score on Admission 55      Picture Your Plate Scores: <40 Unhealthy dietary pattern with much room for improvement.  41-50 Dietary pattern unlikely to meet recommendations for good health and room for improvement. 51-60 More healthful dietary pattern, with some room for improvement.  >60 Healthy dietary pattern, although there may be some specific behaviors that could be improved.   Nutrition Goals Re-Evaluation:   Nutrition Goals Discharge (Final Nutrition Goals Re-Evaluation):   Psychosocial: Target Goals: Acknowledge presence or absence of significant depression and/or stress, maximize coping skills, provide positive support system. Participant is able to verbalize types and ability to use techniques and skills needed for reducing stress and depression.   Education: Stress, Anxiety, and Depression - Group verbal and visual presentation to define topics covered.  Reviews how body is impacted by stress, anxiety, and depression.  Also discusses healthy ways to reduce stress and to treat/manage anxiety and depression.  Written material given at graduation.   Education: Sleep Hygiene -Provides group verbal and written instruction about how sleep can affect your health.  Define sleep hygiene, discuss sleep cycles and impact of sleep habits. Review good sleep hygiene tips.     Initial Review & Psychosocial Screening:  Initial Psych Review & Screening - 03/21/23 1556       Initial Review   Current issues with None Identified      Family Dynamics   Good Support System? Yes   daughter, Rosey Bath     Barriers   Psychosocial barriers to participate in program There are no identifiable barriers or psychosocial needs.      Screening Interventions   Interventions Encouraged to exercise;To provide support and resources with identified psychosocial needs;Provide feedback about the scores to participant    Expected Outcomes Short Term goal: Utilizing psychosocial counselor, staff and physician to assist with identification of specific Stressors or current issues interfering with healing process. Setting desired goal for each stressor or current issue identified.;Long Term Goal: Stressors or current issues are controlled or eliminated.;Short Term goal: Identification and review with participant of any Quality of Life or Depression concerns found by scoring the questionnaire.;Long Term goal: The participant improves quality of Life and PHQ9 Scores as seen by post scores and/or verbalization of changes             Quality of Life Scores:  Scores of 19 and below usually indicate a poorer quality of life in these areas.  A difference of  2-3 points is a clinically meaningful difference.  A difference of 2-3 points in the total score of the Quality of Life Index has been associated with significant improvement in overall quality of life, self-image, physical symptoms, and general health in studies assessing change in quality of life.  PHQ-9: Review Flowsheet  More data exists      04/04/2023 01/24/2023 10/26/2022 10/21/2021 11/13/2020  Depression screen PHQ 2/9  Decreased Interest 0 0 0 0 0  Down, Depressed, Hopeless 0 1 0 0 0  PHQ - 2 Score 0 1 0 0 0  Altered sleeping 0 2 0 0 -  Tired, decreased energy 3 3 3 1  -  Change in appetite 1 1 0 0 -  Feeling bad or failure  about yourself  0 0 0 0 -  Trouble concentrating 0 1 0 0 -  Moving slowly or fidgety/restless 0 0 0 0 -  Suicidal thoughts 0 0 0 0 -  PHQ-9 Score 4 8 3 1  -  Difficult doing work/chores Not difficult at all Not difficult at all Not difficult at all Not difficult at all -   Interpretation of Total Score  Total Score Depression Severity:  1-4 = Minimal depression, 5-9 = Mild depression, 10-14 = Moderate depression, 15-19 = Moderately severe depression, 20-27 = Severe depression   Psychosocial Evaluation and Intervention:  Psychosocial Evaluation - 03/21/23 1608       Psychosocial Evaluation & Interventions   Interventions Encouraged to exercise with the program and follow exercise prescription    Comments Daniel Carr has no barriers to attending the program. He wants to leave the program breathing better. He has support from his daughter Rosey Bath. He has the desire to breath easier . He is ready to start the program    Expected Outcomes STG Daniel Carr is able to attend all scheduled sessions, he is able to progress with his exercise prescription and work on breathing techniques to improve his day to day breathing with his daily activities.  LTG Daniel Carr continues to work on his exerise ad breathing techniques    Continue Psychosocial Services  Follow up required by staff             Psychosocial Re-Evaluation:   Psychosocial Discharge (Final Psychosocial Re-Evaluation):   Education: Education Goals: Education classes will be provided on a weekly basis, covering required topics. Participant will state understanding/return demonstration of topics presented.  Learning Barriers/Preferences:   General Pulmonary Education Topics:  Infection Prevention: - Provides verbal and written material to individual with discussion of infection control including proper hand washing and proper equipment cleaning during exercise session. Flowsheet Row Pulmonary Rehab from 04/04/2023 in [facility] Cardiac and Pulmonary  Rehab  Date 04/04/23  Educator [facility]  Instruction Review Code 1- Verbalizes Understanding       Falls Prevention: - Provides verbal and written material to individual with discussion of falls prevention and safety. Flowsheet Row Pulmonary Rehab from 04/04/2023 in [facility] Cardiac and Pulmonary Rehab  Date 03/21/23  Educator SB  Instruction Review Code 1- Verbalizes Understanding       Chronic Lung Disease Review: - Group verbal instruction with posters, models, PowerPoint presentations and videos,  to review new updates, new respiratory medications, new advancements in procedures and treatments. Providing information on websites and "800" numbers for continued self-education. Includes information about supplement oxygen, available portable oxygen systems, continuous and intermittent flow rates, oxygen safety, concentrators, and Medicare reimbursement for oxygen. Explanation of Pulmonary Drugs, including class, frequency, complications, importance of spacers, rinsing mouth after steroid MDI's, and proper cleaning methods for nebulizers. Review of basic lung anatomy and physiology related to function, structure, and complications of lung disease. Review of risk factors. Discussion about methods for diagnosing sleep apnea and types of masks and machines for OSA. Includes a review of the use of types of environmental controls: home humidity, furnaces, filters, dust mite/pet prevention, HEPA vacuums. Discussion about weather changes, air quality and the benefits of nasal washing. Instruction on Warning signs, infection symptoms, calling MD promptly, preventive modes, and value of vaccinations. Review of effective airway clearance, coughing and/or vibration techniques. Emphasizing that all should Create an Action Plan. Written material given at graduation. Flowsheet Row Pulmonary Rehab from 04/04/2023 in [facility]  Education need identified 04/04/23       AED/CPR: - Group verbal and  written instruction with the use of models to demonstrate the basic use of the AED with the basic ABC's of resuscitation.    Anatomy and Cardiac Procedures: - Group verbal and visual presentation and models provide information about basic cardiac anatomy and function. Reviews the testing methods done to diagnose heart disease and the outcomes  of the test results. Describes the treatment choices: Medical Management, Angioplasty, or Coronary Bypass Surgery for treating various heart conditions including Myocardial Infarction, Angina, Valve Disease, and Cardiac Arrhythmias.  Written material given at graduation.   Medication Safety: - Group verbal and visual instruction to review commonly prescribed medications for heart and lung disease. Reviews the medication, class of the drug, and side effects. Includes the steps to properly store meds and maintain the prescription regimen.  Written material given at graduation.   Other: -Provides group and verbal instruction on various topics (see comments)   Knowledge Questionnaire Score:  Knowledge Questionnaire Score - 04/04/23 1541       Knowledge Questionnaire Score   Pre Score 16/18              Core Components/Risk Factors/Patient Goals at Admission:  Personal Goals and Risk Factors at Admission - 04/04/23 1541       Core Components/Risk Factors/Patient Goals on Admission    Weight Management Yes;Weight Loss    Intervention Weight Management: Develop a combined nutrition and exercise program designed to reach desired caloric intake, while maintaining appropriate intake of nutrient and fiber, sodium and fats, and appropriate energy expenditure required for the weight goal.;Weight Management: Provide education and appropriate resources to help participant work on and attain dietary goals.;Weight Management/Obesity: Establish reasonable short term and long term weight goals.    Admit Weight 210 lb 1.6 oz (95.3 kg)    Goal Weight: Short Term  205 lb (93 kg)    Goal Weight: Long Term 200 lb (90.7 kg)    Expected Outcomes Short Term: Continue to assess and modify interventions until short term weight is achieved;Long Term: Adherence to nutrition and physical activity/exercise program aimed toward attainment of established weight goal;Weight Loss: Understanding of general recommendations for a balanced deficit meal plan, which promotes 1-2 lb weight loss per week and includes a negative energy balance of [phone] kcal/d;Understanding recommendations for meals to include 15-35% energy as protein, 25-35% energy from fat, 35-60% energy from carbohydrates, less than 200mg  of dietary cholesterol, 20-35 gm of total fiber daily;Understanding of distribution of calorie intake throughout the day with the consumption of 4-5 meals/snacks    Improve shortness of breath with ADL's Yes    Intervention Provide education, individualized exercise plan and daily activity instruction to help decrease symptoms of SOB with activities of daily living.    Expected Outcomes Short Term: Improve cardiorespiratory fitness to achieve a reduction of symptoms when performing ADLs;Long Term: Be able to perform more ADLs without symptoms or delay the onset of symptoms    Increase knowledge of respiratory medications and ability to use respiratory devices properly  Yes    Intervention Provide education and demonstration as needed of appropriate use of medications, inhalers, and oxygen therapy.    Expected Outcomes Short Term: Achieves understanding of medications use. Understands that oxygen is a medication prescribed by physician. Demonstrates appropriate use of inhaler and oxygen therapy.;Long Term: Maintain appropriate use of medications, inhalers, and oxygen therapy.    Hypertension Yes    Intervention Provide education on lifestyle modifcations including regular physical activity/exercise, weight management, moderate sodium restriction and increased consumption of fresh  fruit, vegetables, and low fat dairy, alcohol moderation, and smoking cessation.;Monitor prescription use compliance.    Expected Outcomes Short Term: Continued assessment and intervention until BP is < 140/18mm HG in hypertensive participants. < 130/44mm HG in hypertensive participants with diabetes, heart failure or chronic kidney disease.;Long Term: Maintenance of blood pressure at  goal levels.    Lipids Yes    Intervention Provide education and support for participant on nutrition & aerobic/resistive exercise along with prescribed medications to achieve LDL 70mg , HDL >40mg .    Expected Outcomes Short Term: Participant states understanding of desired cholesterol values and is compliant with medications prescribed. Participant is following exercise prescription and nutrition guidelines.;Long Term: Cholesterol controlled with medications as prescribed, with individualized exercise RX and with personalized nutrition plan. Value goals: LDL < 70mg , HDL > 40 mg.             Education:Diabetes - Individual verbal and written instruction to review signs/symptoms of diabetes, desired ranges of glucose level fasting, after meals and with exercise. Acknowledge that pre and post exercise glucose checks will be done for 3 sessions at entry of program.   Know Your Numbers and Heart Failure: - Group verbal and visual instruction to discuss disease risk factors for cardiac and pulmonary disease and treatment options.  Reviews associated critical values for Overweight/Obesity, Hypertension, Cholesterol, and Diabetes.  Discusses basics of heart failure: signs/symptoms and treatments.  Introduces Heart Failure Zone chart for action plan for heart failure.  Written material given at graduation.   Core Components/Risk Factors/Patient Goals Review:    Core Components/Risk Factors/Patient Goals at Discharge (Final Review):    ITP Comments:  ITP Comments     Row Name 03/21/23 1614 04/04/23 1533          ITP Comments Virtual orientation call completed today. he has an appointment on Date: 04/04/2023  for EP eval and gym Orientation.  Documentation of diagnosis can be found in [facility] Date: [id number] . Completed and gym orientation. Initial ITP created and sent for review to Dr. Jinny Sanders, Medical Director.               Comments: Initial ITP

## 2023-04-04 NOTE — Patient Instructions (Signed)
Patient Instructions  Patient Details  Name: Daniel Carr MRN: [id number] Date of Birth: [birth year] Referring Provider:  Malva Limes, MD  Below are your personal goals for exercise, nutrition, and risk factors. Our goal is to help you stay on track towards obtaining and maintaining these goals. We will be discussing your progress on these goals with you throughout the program.  Initial Exercise Prescription:  Initial Exercise Prescription - 04/04/23 1500       Date of Initial Exercise RX and Referring Provider   Date 04/04/23    Referring Provider Mila Merry MD      Oxygen   Maintain Oxygen Saturation 88% or higher      Recumbant Bike   Level 1    RPM 50    Watts 5    Minutes 15    METs 2      NuStep   Level 1    SPM 80    Minutes 15    METs 2      T5 Nustep   Level 1    SPM 80    Minutes 1    METs 2      Biostep-RELP   Level 1    SPM 50    Minutes 15    METs 2      Prescription Details   Frequency (times per week) 2    Duration Progress to 30 minutes of continuous aerobic without signs/symptoms of physical distress      Intensity   THRR 40-80% of Max Heartrate 99-122    Ratings of Perceived Exertion 11-13    Perceived Dyspnea 0-4      Progression   Progression Continue to progress workloads to maintain intensity without signs/symptoms of physical distress.      Resistance Training   Training Prescription Yes    Weight 3 lb    Reps 10-15             Exercise Goals: Frequency: Be able to perform aerobic exercise two to three times per week in program working toward 2-5 days per week of home exercise.  Intensity: Work with a perceived exertion of 11 (fairly light) - 15 (hard) while following your exercise prescription.  We will make changes to your prescription with you as you progress through the program.   Duration: Be able to do 30 to 45 minutes of continuous aerobic exercise in addition to a 5 minute warm-up and a 5 minute cool-down  routine.   Nutrition Goals: Your personal nutrition goals will be established when you do your nutrition analysis with the dietician.  The following are general nutrition guidelines to follow: Cholesterol < 200mg /day Sodium < 1500mg /day Fiber: Men over 50 yrs - 30 grams per day  Personal Goals:  Personal Goals and Risk Factors at Admission - 04/04/23 1541       Core Components/Risk Factors/Patient Goals on Admission    Weight Management Yes;Weight Loss    Intervention Weight Management: Develop a combined nutrition and exercise program designed to reach desired caloric intake, while maintaining appropriate intake of nutrient and fiber, sodium and fats, and appropriate energy expenditure required for the weight goal.;Weight Management: Provide education and appropriate resources to help participant work on and attain dietary goals.;Weight Management/Obesity: Establish reasonable short term and long term weight goals.    Admit Weight 210 lb 1.6 oz (95.3 kg)    Goal Weight: Short Term 205 lb (93 kg)    Goal Weight: Long Term 200 lb (90.7 kg)  Expected Outcomes Short Term: Continue to assess and modify interventions until short term weight is achieved;Long Term: Adherence to nutrition and physical activity/exercise program aimed toward attainment of established weight goal;Weight Loss: Understanding of general recommendations for a balanced deficit meal plan, which promotes 1-2 lb weight loss per week and includes a negative energy balance of [phone] kcal/d;Understanding recommendations for meals to include 15-35% energy as protein, 25-35% energy from fat, 35-60% energy from carbohydrates, less than 200mg  of dietary cholesterol, 20-35 gm of total fiber daily;Understanding of distribution of calorie intake throughout the day with the consumption of 4-5 meals/snacks    Improve shortness of breath with ADL's Yes    Intervention Provide education, individualized exercise plan and daily activity  instruction to help decrease symptoms of SOB with activities of daily living.    Expected Outcomes Short Term: Improve cardiorespiratory fitness to achieve a reduction of symptoms when performing ADLs;Long Term: Be able to perform more ADLs without symptoms or delay the onset of symptoms    Increase knowledge of respiratory medications and ability to use respiratory devices properly  Yes    Intervention Provide education and demonstration as needed of appropriate use of medications, inhalers, and oxygen therapy.    Expected Outcomes Short Term: Achieves understanding of medications use. Understands that oxygen is a medication prescribed by physician. Demonstrates appropriate use of inhaler and oxygen therapy.;Long Term: Maintain appropriate use of medications, inhalers, and oxygen therapy.    Hypertension Yes    Intervention Provide education on lifestyle modifcations including regular physical activity/exercise, weight management, moderate sodium restriction and increased consumption of fresh fruit, vegetables, and low fat dairy, alcohol moderation, and smoking cessation.;Monitor prescription use compliance.    Expected Outcomes Short Term: Continued assessment and intervention until BP is < 140/28mm HG in hypertensive participants. < 130/83mm HG in hypertensive participants with diabetes, heart failure or chronic kidney disease.;Long Term: Maintenance of blood pressure at goal levels.    Lipids Yes    Intervention Provide education and support for participant on nutrition & aerobic/resistive exercise along with prescribed medications to achieve LDL 70mg , HDL >40mg .    Expected Outcomes Short Term: Participant states understanding of desired cholesterol values and is compliant with medications prescribed. Participant is following exercise prescription and nutrition guidelines.;Long Term: Cholesterol controlled with medications as prescribed, with individualized exercise RX and with personalized nutrition  plan. Value goals: LDL < 70mg , HDL > 40 mg.             Tobacco Use Initial Evaluation: Social History   Tobacco Use  Smoking Status Former   Packs/day: 0.25   Years: 6.00   Additional pack years: 0.00   Total pack years: 1.50   Types: Cigarettes   Quit date: 11/13/1969   Years since quitting: 53.4  Smokeless Tobacco Never  Tobacco Comments   quit in 1970's    Exercise Goals and Review:  Exercise Goals     Row Name 04/04/23 1538             Exercise Goals   Increase Physical Activity Yes       Intervention Provide advice, education, support and counseling about physical activity/exercise needs.;Develop an individualized exercise prescription for aerobic and resistive training based on initial evaluation findings, risk stratification, comorbidities and participant's personal goals.       Expected Outcomes Long Term: Add in home exercise to make exercise part of routine and to increase amount of physical activity.;Long Term: Exercising regularly at least 3-5 days a week.;Short Term:  Attend rehab on a regular basis to increase amount of physical activity.       Increase Strength and Stamina Yes       Intervention Provide advice, education, support and counseling about physical activity/exercise needs.;Develop an individualized exercise prescription for aerobic and resistive training based on initial evaluation findings, risk stratification, comorbidities and participant's personal goals.       Expected Outcomes Short Term: Perform resistance training exercises routinely during rehab and add in resistance training at home;Short Term: Increase workloads from initial exercise prescription for resistance, speed, and METs.;Long Term: Improve cardiorespiratory fitness, muscular endurance and strength as measured by increased METs and functional capacity ( )       Able to understand and use rate of perceived exertion (RPE) scale Yes       Intervention Provide education and  explanation on how to use RPE scale       Expected Outcomes Short Term: Able to use RPE daily in rehab to express subjective intensity level;Long Term:  Able to use RPE to guide intensity level when exercising independently       Able to understand and use Dyspnea scale Yes       Intervention Provide education and explanation on how to use Dyspnea scale       Expected Outcomes Long Term: Able to use Dyspnea scale to guide intensity level when exercising independently;Short Term: Able to use Dyspnea scale daily in rehab to express subjective sense of shortness of breath during exertion       Knowledge and understanding of Target Heart Rate Range (THRR) Yes       Intervention Provide education and explanation of THRR including how the numbers were predicted and where they are located for reference       Expected Outcomes Short Term: Able to state/look up THRR;Long Term: Able to use THRR to govern intensity when exercising independently;Short Term: Able to use daily as guideline for intensity in rehab       Able to check pulse independently Yes       Intervention Provide education and demonstration on how to check pulse in carotid and radial arteries.;Review the importance of being able to check your own pulse for safety during independent exercise       Expected Outcomes Short Term: Able to explain why pulse checking is important during independent exercise;Long Term: Able to check pulse independently and accurately       Understanding of Exercise Prescription Yes       Intervention Provide education, explanation, and written materials on patient's individual exercise prescription       Expected Outcomes Short Term: Able to explain program exercise prescription;Long Term: Able to explain home exercise prescription to exercise independently                Copy of goals given to participant.

## 2023-04-06 ENCOUNTER — Other Ambulatory Visit (HOSPITAL_COMMUNITY): Payer: Self-pay | Admitting: *Deleted

## 2023-04-06 ENCOUNTER — Encounter (HOSPITAL_COMMUNITY): Payer: Self-pay

## 2023-04-06 ENCOUNTER — Encounter: Payer: Medicare HMO | Admitting: *Deleted

## 2023-04-06 DIAGNOSIS — J449 Chronic obstructive pulmonary disease, unspecified: Secondary | ICD-10-CM

## 2023-04-06 DIAGNOSIS — J986 Disorders of diaphragm: Secondary | ICD-10-CM | POA: Diagnosis not present

## 2023-04-06 MED ORDER — METOPROLOL TARTRATE 100 MG PO TABS: ORAL_TABLET | ORAL | 0 refills | Status: DC

## 2023-04-06 NOTE — Progress Notes (Signed)
Daily Session Note  Patient Details  Name: Daniel Carr MRN: [id number] Date of Birth: 03-Jan-1935 Referring Provider:   Flowsheet Row Pulmonary Rehab from 04/04/2023 in [facility] Card[facility] Pul[facility] Re[facility]  Referring Provider Mila Merry MD       Encounter Date: 04/06/2023  Check In:  Session Check In - 04/06/23 1001       Check-In   Supervising physician immediately available to respond to emergencies See telemetry face sheet for immediately available ER MD    Location [facility]-Cardiac & Pulmonary Rehab    Staff Present Lanny Hurst, RN, ADN;Jessica Juanetta Gosling, MA, RCEP, CCRP, CCET;Noah Tickle, BS, Exercise Physiologist    Virtual Visit No    Medication changes reported     No    Fall or balance concerns reported    No    Warm-up and Cool-down Performed on first and last piece of equipment    Resistance Training Performed Yes    VAD Patient? No    PAD/SET Patient? No      Pain Assessment   Currently in Pain? No/denies                Social History   Tobacco Use  Smoking Status Former   Packs/day: 0.25   Years: 6.00   Additional pack years: 0.00   Total pack years: 1.50   Types: Cigarettes   Quit date: 11/13/1969   Years since quitting: 53.4  Smokeless Tobacco Never  Tobacco Comments   quit in 1970's    Goals Met:  Independence with exercise equipment Exercise tolerated well No report of concerns or symptoms today Strength training completed today  Goals Unmet:  Not Applicable  Comments: First full day of exercise!  Patient was oriented to gym and equipment including functions, settings, policies, and procedures.  Patient's individual exercise prescription and treatment plan were reviewed.  All starting workloads were established based on the results of the 6 minute walk test done at initial orientation visit.  The plan for exercise progression was also introduced and progression will be customized based on patient's performance and goals.    Dr. Bethann Punches  is Medical Director for [facility] Cardiac Rehabilitation.  Dr. Vida Rigger is Medical Director for [facility] Pulmonary Rehabilitation.

## 2023-04-07 ENCOUNTER — Telehealth (HOSPITAL_COMMUNITY): Payer: Self-pay | Admitting: *Deleted

## 2023-04-07 ENCOUNTER — Ambulatory Visit: Admission: RE | Admit: 2023-04-07 | Payer: Medicare HMO | Source: Ambulatory Visit

## 2023-04-07 NOTE — Telephone Encounter (Signed)
Attempted to call patient regarding upcoming cardiac CT appointment. °Left message on voicemail with name and callback number ° °Siyona Coto RN Navigator Cardiac Imaging °[facility] °[phone] Office °[phone] Cell ° °

## 2023-04-07 NOTE — Telephone Encounter (Signed)
Patient's daughter returning call about the patient's upcoming cardiac imaging study; pt's daughter verbalizes understanding of appt date/time, parking situation and where to check in, pre-test NPO status and medications ordered, and verified current allergies; name and call back number provided for further questions should they arise  Larey Brick RN Navigator Cardiac Imaging [facility] [phone] office [phone] cell  Patient to take 100mg  metoprolol tartrate two hours prior to his cardiac CT scan. His daughter is aware he is to arrive at 9:30am.

## 2023-04-11 ENCOUNTER — Ambulatory Visit (HOSPITAL_COMMUNITY)
Admission: RE | Admit: 2023-04-11 | Discharge: 2023-04-11 | Disposition: A | Discharge status: Home / Self Care | Payer: Medicare HMO | Source: Ambulatory Visit | Attending: Internal Medicine | Admitting: Internal Medicine

## 2023-04-11 DIAGNOSIS — I10 Essential (primary) hypertension: Secondary | ICD-10-CM | POA: Diagnosis not present

## 2023-04-11 DIAGNOSIS — R0602 Shortness of breath: Secondary | ICD-10-CM | POA: Insufficient documentation

## 2023-04-11 DIAGNOSIS — I700 Atherosclerosis of aorta: Secondary | ICD-10-CM | POA: Insufficient documentation

## 2023-04-11 DIAGNOSIS — J479 Bronchiectasis, uncomplicated: Secondary | ICD-10-CM | POA: Insufficient documentation

## 2023-04-11 DIAGNOSIS — I2510 Atherosclerotic heart disease of native coronary artery without angina pectoris: Secondary | ICD-10-CM | POA: Diagnosis not present

## 2023-04-11 MED ORDER — NITROGLYCERIN 0.4 MG SL SUBL
0.8000 mg | SUBLINGUAL_TABLET | Freq: Once | SUBLINGUAL | Status: AC
  Administered 2023-04-11: 0.8 mg via SUBLINGUAL

## 2023-04-11 MED ORDER — METOPROLOL TARTRATE 5 MG/5ML IV SOLN
5.0000 mg | INTRAVENOUS | Status: DC | PRN
  Administered 2023-04-11: 5 mg via INTRAVENOUS

## 2023-04-11 MED ORDER — METOPROLOL TARTRATE 5 MG/5ML IV SOLN
INTRAVENOUS | Status: AC
  Filled 2023-04-11: qty 5

## 2023-04-11 MED ORDER — IOHEXOL 350 MG/ML SOLN
95.0000 mL | Freq: Once | INTRAVENOUS | Status: AC | PRN
  Administered 2023-04-11: 95 mL via INTRAVENOUS

## 2023-04-11 MED ORDER — NITROGLYCERIN 0.4 MG SL SUBL
SUBLINGUAL_TABLET | SUBLINGUAL | Status: AC
  Filled 2023-04-11: qty 2

## 2023-04-11 NOTE — Progress Notes (Signed)
This patient has received 62 ml's of IV omni350 (type of contrast) contrast extravasation into the right antecubital during a CT Coronary exam.  The exam was performed on (date) Monday, 04/11/23 @ 10:25am  Site / affected area assessed by Alwyn Ren, NP

## 2023-04-11 NOTE — Progress Notes (Signed)
  Evaluation after Contrast Extravasation  Patient seen and examined immediately after contrast extravasation while in CT 1 at [name].  Exam: There is moderate swelling at the right antecubital area.  There is no erythema. There is no discoloration. There are no blisters. There are no signs of decreased perfusion of the skin.  It is warm to touch.  The patient has full ROM in fingers.  Radial pulse is normal.  Per contrast extravasation protocol, I have instructed the patient to keep an ice pack on the area for 20-60 minutes at a time for about 48 hours.   Keep arm elevated as much as possible.   The patient understands to call the radiology department if there is: - increase in pain or swelling - changed or altered sensation - ulceration or blistering - increasing redness - warmth or increasing firmness - decreased tissue perfusion as noted by decreased capillary refill or discoloration of skin - decreased pulses peripheral to site   Mickie Kay, NP 04/11/2023 10:49 AM

## 2023-04-13 ENCOUNTER — Encounter: Payer: Self-pay | Admitting: *Deleted

## 2023-04-13 ENCOUNTER — Encounter: Payer: Medicare HMO | Admitting: *Deleted

## 2023-04-13 DIAGNOSIS — J986 Disorders of diaphragm: Secondary | ICD-10-CM | POA: Diagnosis not present

## 2023-04-13 DIAGNOSIS — J449 Chronic obstructive pulmonary disease, unspecified: Secondary | ICD-10-CM

## 2023-04-13 NOTE — Progress Notes (Signed)
Pulmonary Individual Treatment Plan  Patient Details  Name: Daniel Carr MRN: [id number] Date of Birth: 07-14-1935 Referring Provider:   Flowsheet Row Pulmonary Rehab from 04/04/2023 in [facility] Cardiac and Pulmonary Rehab  Referring Provider Mila Merry MD       Initial Encounter Date:  Flowsheet Row Pulmonary Rehab from 04/04/2023 in [facility] Cardiac and Pulmonary Rehab  Date 04/04/23       Visit Diagnosis: Stage 3 severe COPD by GOLD classification  Diaphragm paralysis  Patient's Home Medications on Admission:  Current Outpatient Medications:    amLODipine (NORVASC) 2.5 MG tablet, Take 1 tablet by mouth once daily, Disp: 90 tablet, Rfl: 4   aspirin 81 MG EC tablet, Take 1 tablet (81 mg total) by mouth daily. Swallow whole., Disp: , Rfl:    azelastine (ASTELIN) 0.1 % nasal spray, Place 1 spray into the nose daily as needed (allergies)., Disp: , Rfl:    brimonidine (ALPHAGAN) 0.2 % ophthalmic solution, Place 1 drop into both eyes in the morning and at bedtime., Disp: , Rfl:    clotrimazole-betamethasone (LOTRISONE) cream, APPLY TOPICALLY ONCE DAILY (Patient not taking: Reported on 03/21/2023), Disp: 15 g, Rfl: 0   COD LIVER OIL PO, Take 1 capsule by mouth daily. (Patient not taking: Reported on 03/21/2023), Disp: , Rfl:    dorzolamide (TRUSOPT) 2 % ophthalmic solution, Apply to eye., Disp: , Rfl:    dutasteride (AVODART) 0.5 MG capsule, Take 1 capsule by mouth once daily, Disp: 90 capsule, Rfl: 4   enalapril-hydrochlorothiazide (VASERETIC) 10-25 MG tablet, hydroCHLOROthiazide 25mg  (Patient not taking: Reported on 03/21/2023), Disp: , Rfl:    hydrochlorothiazide (HYDRODIURIL) 25 MG tablet, TAKE ONE-HALF TABLET BY MOUTH ONCE DAILY, Disp: 45 tablet, Rfl: 03   ipratropium (ATROVENT) 0.06 % nasal spray, Place 1 spray into both nostrils 2 (two) times daily as needed for rhinitis., Disp: , Rfl: 6   latanoprost (XALATAN) 0.005 % ophthalmic solution, SMARTSIG:In Eye(s), Disp: , Rfl:     levocetirizine (XYZAL) 5 MG tablet, Take 5 mg by mouth every evening., Disp: , Rfl:    levothyroxine (SYNTHROID) 150 MCG tablet, Take 1 tablet by mouth once daily, Disp: 90 tablet, Rfl: 4   lovastatin (MEVACOR) 40 MG tablet, Take 1 tablet by mouth once daily, Disp: 90 tablet, Rfl: 5   metoprolol tartrate (LOPRESSOR) 100 MG tablet, Take tablet TWO hours prior to his cardiac CT scan., Disp: 1 tablet, Rfl: 0   Multiple Vitamin (DAILY VITAMIN PO), Take 1 tablet by mouth daily., Disp: , Rfl:    naproxen (NAPROSYN) 500 MG tablet, Take 1 tablet (500 mg total) by mouth 2 (two) times daily with a meal. (Patient not taking: Reported on 03/21/2023), Disp: 30 tablet, Rfl: 1   predniSONE (DELTASONE) 5 MG tablet, Take by mouth., Disp: , Rfl:    traMADol (ULTRAM) 50 MG tablet, Take 50 mg by mouth every 6 (six) hours as needed., Disp: , Rfl:    vitamin B-12 (CYANOCOBALAMIN) 100 MCG tablet, Take 100 mcg by mouth daily. Unsure of dose  is OTC med, Disp: , Rfl:   Past Medical History: Past Medical History:  Diagnosis Date   Arthritis    childhood infection    chicken pox, measels, and mumps   Hypertension    Hypothyroidism    Sinus problem    Ulnar neuropathy    bilateral    Tobacco Use: Social History   Tobacco Use  Smoking Status Former   Packs/day: 0.25   Years: 6.00   Additional pack years:  0.00   Total pack years: 1.50   Types: Cigarettes   Quit date: 11/13/1969   Years since quitting: 53.4  Smokeless Tobacco Never  Tobacco Comments   quit in 1970's    Labs: Review Flowsheet  More data exists      Latest Ref Rng & Units 06/11/2019 07/22/2020 07/06/2021 03/29/2022 10/26/2022  Labs for ITP Cardiac and Pulmonary Rehab  Cholestrol 100 - 199 mg/dL 161  096  045  409  811   LDL (calc) 0 - 99 mg/dL 76  64  74  66  71   HDL-C >39 mg/dL 65  68  76  81  90   Trlycerides 0 - 149 mg/dL 76  89  61  60  57      Pulmonary Assessment Scores:  Pulmonary Assessment Scores     Row Name 04/04/23 1543          ADL UCSD   ADL Phase Entry     SOB Score total 72     Rest 3     Walk 4     Stairs 4     Bath 2     Dress 2     Shop 3       CAT Score   CAT Score 17       mMRC Score   mMRC Score 4              UCSD: Self-administered rating of dyspnea associated with activities of daily living (ADLs) 6-point scale (0 = "not at all" to 5 = "maximal or unable to do because of breathlessness")  Scoring Scores range from 0 to 120.  Minimally important difference is 5 units  CAT: CAT can identify the health impairment of COPD patients and is better correlated with disease progression.  CAT has a scoring range of zero to 40. The CAT score is classified into four groups of low (less than 10), medium (10 - 20), high (21-30) and very high (31-40) based on the impact level of disease on health status. A CAT score over 10 suggests significant symptoms.  A worsening CAT score could be explained by an exacerbation, poor medication adherence, poor inhaler technique, or progression of COPD or comorbid conditions.  CAT MCID is 2 points  mMRC: mMRC (Modified Medical Research Council) Dyspnea Scale is used to assess the degree of baseline functional disability in patients of respiratory disease due to dyspnea. No minimal important difference is established. A decrease in score of 1 point or greater is considered a positive change.   Pulmonary Function Assessment:  Pulmonary Function Assessment - 04/04/23 1543       Pulmonary Function Tests   FVC% 2.28 %   test from 02/04/21   FEV1% 1.17 %    FEV1/FVC Ratio 51.52      Breath   Shortness of Breath Yes;Limiting activity             Exercise Target Goals: Exercise Program Goal: Individual exercise prescription set using results from initial 6 min walk test and THRR while considering  patient's activity barriers and safety.   Exercise Prescription Goal: Initial exercise prescription builds to 30-45 minutes a day of aerobic activity, 2-3  days per week.  Home exercise guidelines will be given to patient during program as part of exercise prescription that the participant will acknowledge.  Education: Aerobic Exercise: - Group verbal and visual presentation on the components of exercise prescription. Introduces F.I.T.T principle from ACSM for exercise  prescriptions.  Reviews F.I.T.T. principles of aerobic exercise including progression. Written material given at graduation.   Education: Resistance Exercise: - Group verbal and visual presentation on the components of exercise prescription. Introduces F.I.T.T principle from ACSM for exercise prescriptions  Reviews F.I.T.T. principles of resistance exercise including progression. Written material given at graduation.    Education: Exercise & Equipment Safety: - Individual verbal instruction and demonstration of equipment use and safety with use of the equipment. Flowsheet Row Pulmonary Rehab from 04/13/2023 in [facility] Cardiac and Pulmonary Rehab  Date 04/04/23  Educator [facility]  Instruction Review Code 1- Verbalizes Understanding       Education: Exercise Physiology & General Exercise Guidelines: - Group verbal and written instruction with models to review the exercise physiology of the cardiovascular system and associated critical values. Provides general exercise guidelines with specific guidelines to those with heart or lung disease.    Education: Flexibility, Balance, Mind/Body Relaxation: - Group verbal and visual presentation with interactive activity on the components of exercise prescription. Introduces F.I.T.T principle from ACSM for exercise prescriptions. Reviews F.I.T.T. principles of flexibility and balance exercise training including progression. Also discusses the mind body connection.  Reviews various relaxation techniques to help reduce and manage stress (i.e. Deep breathing, progressive muscle relaxation, and visualization). Balance handout provided to take home. Written  material given at graduation.   Activity Barriers & Risk Stratification:  Activity Barriers & Cardiac Risk Stratification - 04/04/23 1535       Activity Barriers & Cardiac Risk Stratification   Activity Barriers Joint Problems;Right Knee Replacement;Left Knee Replacement;Arthritis;Muscular Weakness;Deconditioning;Balance Concerns;Other (comment);Shortness of Breath;History of Falls;Assistive Device    Comments Arthritis all over causing deformities in hands, neuropathy in feet             6 Minute Walk:  6 Minute Walk     Row Name 04/04/23 1533         6 Minute Walk   Phase Initial     Distance 310 feet     Walk Time 5 minutes     # of Rest Breaks 1  1 minute rest     MPH 0.7     METS 0.51     RPE 13     Perceived Dyspnea  3     VO2 Peak 1.77     Symptoms Yes (comment)     Comments fatigue, SOB, chronic foot pain     Resting HR 75 bpm     Resting BP 134/66     Resting Oxygen Saturation  94 %     Exercise Oxygen Saturation  during 6 min walk 87 %     Max Ex. HR 102 bpm     Max Ex. BP 154/72     2 Minute Post BP 138/70       Interval HR   1 Minute HR 94     2 Minute HR 98     3 Minute HR 99     4 Minute HR 96     5 Minute HR 101     6 Minute HR 102     2 Minute Post HR 79     Interval Heart Rate? Yes       Interval Oxygen   Interval Oxygen? Yes     Baseline Oxygen Saturation % 94 %     1 Minute Oxygen Saturation % 89 %     1 Minute Liters of Oxygen 0 L  Room Air     2  Minute Oxygen Saturation % 87 %     2 Minute Liters of Oxygen 0 L     3 Minute Oxygen Saturation % 84 %     3 Minute Liters of Oxygen 0 L     4 Minute Oxygen Saturation % 88 %     4 Minute Liters of Oxygen 0 L     5 Minute Oxygen Saturation % 87 %     5 Minute Liters of Oxygen 0 L     6 Minute Oxygen Saturation % 89 %     6 Minute Liters of Oxygen 0 L     2 Minute Post Oxygen Saturation % 98 %     2 Minute Post Liters of Oxygen 0 L             Oxygen Initial Assessment:  Oxygen  Initial Assessment - 04/04/23 1542       Home Oxygen   Home Oxygen Device None    Sleep Oxygen Prescription None    Home Exercise Oxygen Prescription None    Home Resting Oxygen Prescription None    Compliance with Home Oxygen Use Yes      Initial 6 min Walk   Oxygen Used None      Program Oxygen Prescription   Program Oxygen Prescription None      Intervention   Short Term Goals To learn and demonstrate proper pursed lip breathing techniques or other breathing techniques. ;To learn and understand importance of maintaining oxygen saturations>88%;To learn and demonstrate proper use of respiratory medications;To learn and understand importance of monitoring SPO2 with pulse oximeter and demonstrate accurate use of the pulse oximeter.    Long  Term Goals Exhibits proper breathing techniques, such as pursed lip breathing or other method taught during program session;Compliance with respiratory medication;Demonstrates proper use of MDI's;Maintenance of O2 saturations>88%;Verbalizes importance of monitoring SPO2 with pulse oximeter and return demonstration             Oxygen Re-Evaluation:  Oxygen Re-Evaluation     Row Name 04/06/23 1004             Goals/Expected Outcomes   Comments Reviewed PLB technique with pt.  Talked about how it works and it's importance in maintaining their exercise saturations.       Goals/Expected Outcomes Short: Become more profiecient at using PLB. Long: Become independent at using PLB.                Oxygen Discharge (Final Oxygen Re-Evaluation):  Oxygen Re-Evaluation - 04/06/23 1004       Goals/Expected Outcomes   Comments Reviewed PLB technique with pt.  Talked about how it works and it's importance in maintaining their exercise saturations.    Goals/Expected Outcomes Short: Become more profiecient at using PLB. Long: Become independent at using PLB.             Initial Exercise Prescription:  Initial Exercise Prescription -  04/04/23 1500       Date of Initial Exercise RX and Referring Provider   Date 04/04/23    Referring Provider Mila Merry MD      Oxygen   Maintain Oxygen Saturation 88% or higher      Recumbant Bike   Level 1    RPM 50    Watts 5    Minutes 15    METs 2      NuStep   Level 1    SPM 80    Minutes 15  METs 2      T5 Nustep   Level 1    SPM 80    Minutes 1    METs 2      Biostep-RELP   Level 1    SPM 50    Minutes 15    METs 2      Prescription Details   Frequency (times per week) 2    Duration Progress to 30 minutes of continuous aerobic without signs/symptoms of physical distress      Intensity   THRR 40-80% of Max Heartrate 99-122    Ratings of Perceived Exertion 11-13    Perceived Dyspnea 0-4      Progression   Progression Continue to progress workloads to maintain intensity without signs/symptoms of physical distress.      Resistance Training   Training Prescription Yes    Weight 3 lb    Reps 10-15             Perform Capillary Blood Glucose checks as needed.  Exercise Prescription Changes:   Exercise Prescription Changes     Row Name 04/04/23 1500 04/11/23 1300           Response to Exercise   Blood Pressure (Admit) 134/66 128/64      Blood Pressure (Exercise) 154/72 136/70      Blood Pressure (Exit) 138/70 122/60      Heart Rate (Admit) 75 bpm 90 bpm      Heart Rate (Exercise) 102 bpm 100 bpm      Heart Rate (Exit) 80 bpm 100 bpm      Oxygen Saturation (Admit) 94 % 90 %      Oxygen Saturation (Exercise) 87 % 90 %      Oxygen Saturation (Exit) 95 % 95 %      Rating of Perceived Exertion (Exercise) 13 13      Perceived Dyspnea (Exercise) 3 3      Symptoms SOB, chronic foot pain SOB      Comments walk test results First full day of exercise      Duration -- Progress to 30 minutes of  aerobic without signs/symptoms of physical distress      Intensity -- THRR unchanged        Progression   Progression -- Continue to progress  workloads to maintain intensity without signs/symptoms of physical distress.      Average METs -- 2        Resistance Training   Training Prescription -- Yes      Weight -- 3 lb      Reps -- 10-15        Interval Training   Interval Training -- No        Recumbant Bike   Level -- 1      Watts -- 25      Minutes -- 15        T5 Nustep   Level -- 1      Minutes -- 15      METs -- 2        Oxygen   Maintain Oxygen Saturation -- 88% or higher               Exercise Comments:   Exercise Comments     Row Name 04/06/23 1003           Exercise Comments First full day of exercise!  Patient was oriented to gym and equipment including functions, settings, policies, and procedures.  Patient's individual  exercise prescription and treatment plan were reviewed.  All starting workloads were established based on the results of the 6 minute walk test done at initial orientation visit.  The plan for exercise progression was also introduced and progression will be customized based on patient's performance and goals.                Exercise Goals and Review:   Exercise Goals     Row Name 04/04/23 1538             Exercise Goals   Increase Physical Activity Yes       Intervention Provide advice, education, support and counseling about physical activity/exercise needs.;Develop an individualized exercise prescription for aerobic and resistive training based on initial evaluation findings, risk stratification, comorbidities and participant's personal goals.       Expected Outcomes Long Term: Add in home exercise to make exercise part of routine and to increase amount of physical activity.;Long Term: Exercising regularly at least 3-5 days a week.;Short Term: Attend rehab on a regular basis to increase amount of physical activity.       Increase Strength and Stamina Yes       Intervention Provide advice, education, support and counseling about physical activity/exercise  needs.;Develop an individualized exercise prescription for aerobic and resistive training based on initial evaluation findings, risk stratification, comorbidities and participant's personal goals.       Expected Outcomes Short Term: Perform resistance training exercises routinely during rehab and add in resistance training at home;Short Term: Increase workloads from initial exercise prescription for resistance, speed, and METs.;Long Term: Improve cardiorespiratory fitness, muscular endurance and strength as measured by increased METs and functional capacity ( )       Able to understand and use rate of perceived exertion (RPE) scale Yes       Intervention Provide education and explanation on how to use RPE scale       Expected Outcomes Short Term: Able to use RPE daily in rehab to express subjective intensity level;Long Term:  Able to use RPE to guide intensity level when exercising independently       Able to understand and use Dyspnea scale Yes       Intervention Provide education and explanation on how to use Dyspnea scale       Expected Outcomes Long Term: Able to use Dyspnea scale to guide intensity level when exercising independently;Short Term: Able to use Dyspnea scale daily in rehab to express subjective sense of shortness of breath during exertion       Knowledge and understanding of Target Heart Rate Range (THRR) Yes       Intervention Provide education and explanation of THRR including how the numbers were predicted and where they are located for reference       Expected Outcomes Short Term: Able to state/look up THRR;Long Term: Able to use THRR to govern intensity when exercising independently;Short Term: Able to use daily as guideline for intensity in rehab       Able to check pulse independently Yes       Intervention Provide education and demonstration on how to check pulse in carotid and radial arteries.;Review the importance of being able to check your own pulse for safety during  independent exercise       Expected Outcomes Short Term: Able to explain why pulse checking is important during independent exercise;Long Term: Able to check pulse independently and accurately       Understanding of Exercise Prescription Yes  Intervention Provide education, explanation, and written materials on patient's individual exercise prescription       Expected Outcomes Short Term: Able to explain program exercise prescription;Long Term: Able to explain home exercise prescription to exercise independently                Exercise Goals Re-Evaluation :  Exercise Goals Re-Evaluation     Row Name 04/06/23 1003 04/11/23 1352           Exercise Goal Re-Evaluation   Exercise Goals Review Able to understand and use rate of perceived exertion (RPE) scale;Able to understand and use Dyspnea scale;Knowledge and understanding of Target Heart Rate Range (THRR);Understanding of Exercise Prescription Increase Physical Activity;Increase Strength and Stamina;Understanding of Exercise Prescription      Comments Reviewed RPE scale, THR and program prescription with pt today.  Pt voiced understanding and was given a copy of goals to take home. Deundra is off to a good start in rehab. During his first day of exercise he was able to work at level 1 on both the T5 nustep and the recumbent bike. He also tolerated using 3 lb hand weights for resistance training. We will continue to monitor his progress in the program.      Expected Outcomes Short: Use RPE daily to regulate intensity. Long: Follow program prescription in THR. Short: Continue to follow current exercise prescription. Long: Continue to improve strength and stamina.               Discharge Exercise Prescription (Final Exercise Prescription Changes):  Exercise Prescription Changes - 04/11/23 1300       Response to Exercise   Blood Pressure (Admit) 128/64    Blood Pressure (Exercise) 136/70    Blood Pressure (Exit) 122/60    Heart  Rate (Admit) 90 bpm    Heart Rate (Exercise) 100 bpm    Heart Rate (Exit) 100 bpm    Oxygen Saturation (Admit) 90 %    Oxygen Saturation (Exercise) 90 %    Oxygen Saturation (Exit) 95 %    Rating of Perceived Exertion (Exercise) 13    Perceived Dyspnea (Exercise) 3    Symptoms SOB    Comments First full day of exercise    Duration Progress to 30 minutes of  aerobic without signs/symptoms of physical distress    Intensity THRR unchanged      Progression   Progression Continue to progress workloads to maintain intensity without signs/symptoms of physical distress.    Average METs 2      Resistance Training   Training Prescription Yes    Weight 3 lb    Reps 10-15      Interval Training   Interval Training No      Recumbant Bike   Level 1    Watts 25    Minutes 15      T5 Nustep   Level 1    Minutes 15    METs 2      Oxygen   Maintain Oxygen Saturation 88% or higher             Nutrition:  Target Goals: Understanding of nutrition guidelines, daily intake of sodium 1500mg , cholesterol 200mg , calories 30% from fat and 7% or less from saturated fats, daily to have 5 or more servings of fruits and vegetables.  Education: All About Nutrition: -Group instruction provided by verbal, written material, interactive activities, discussions, models, and posters to present general guidelines for heart healthy nutrition including fat, fiber, MyPlate, the role  of sodium in heart healthy nutrition, utilization of the nutrition label, and utilization of this knowledge for meal planning. Follow up email sent as well. Written material given at graduation. Flowsheet Row Pulmonary Rehab from 04/13/2023 in [facility] Cardiac and Pulmonary Rehab  Date --  [part 2]       Biometrics:  Pre Biometrics - 04/06/23 1009       Pre Biometrics   Waist Circumference 32 inches    Hip Circumference 43 inches    Waist to Hip Ratio 0.74 %              Nutrition Therapy Plan and Nutrition  Goals:  Nutrition Therapy & Goals - 04/04/23 1540       Intervention Plan   Intervention Prescribe, educate and counsel regarding individualized specific dietary modifications aiming towards targeted core components such as weight, hypertension, lipid management, diabetes, heart failure and other comorbidities.    Expected Outcomes Short Term Goal: Understand basic principles of dietary content, such as calories, fat, sodium, cholesterol and nutrients.             Nutrition Assessments:  MEDIFICTS Score Key: ?70 Need to make dietary changes  40-70 Heart Healthy Diet ? 40 Therapeutic Level Cholesterol Diet  Flowsheet Row Pulmonary Rehab from 04/04/2023 in [facility] Cardiac and Pulmonary Rehab  Picture Your Plate Total Score on Admission 55      Picture Your Plate Scores: <16 Unhealthy dietary pattern with much room for improvement. 41-50 Dietary pattern unlikely to meet recommendations for good health and room for improvement. 51-60 More healthful dietary pattern, with some room for improvement.  >60 Healthy dietary pattern, although there may be some specific behaviors that could be improved.   Nutrition Goals Re-Evaluation:   Nutrition Goals Discharge (Final Nutrition Goals Re-Evaluation):   Psychosocial: Target Goals: Acknowledge presence or absence of significant depression and/or stress, maximize coping skills, provide positive support system. Participant is able to verbalize types and ability to use techniques and skills needed for reducing stress and depression.   Education: Stress, Anxiety, and Depression - Group verbal and visual presentation to define topics covered.  Reviews how body is impacted by stress, anxiety, and depression.  Also discusses healthy ways to reduce stress and to treat/manage anxiety and depression.  Written material given at graduation.   Education: Sleep Hygiene -Provides group verbal and written instruction about how sleep can affect your  health.  Define sleep hygiene, discuss sleep cycles and impact of sleep habits. Review good sleep hygiene tips.    Initial Review & Psychosocial Screening:  Initial Psych Review & Screening - 03/21/23 1556       Initial Review   Current issues with None Identified      Family Dynamics   Good Support System? Yes   daughter, Rosey Bath     Barriers   Psychosocial barriers to participate in program There are no identifiable barriers or psychosocial needs.      Screening Interventions   Interventions Encouraged to exercise;To provide support and resources with identified psychosocial needs;Provide feedback about the scores to participant    Expected Outcomes Short Term goal: Utilizing psychosocial counselor, staff and physician to assist with identification of specific Stressors or current issues interfering with healing process. Setting desired goal for each stressor or current issue identified.;Long Term Goal: Stressors or current issues are controlled or eliminated.;Short Term goal: Identification and review with participant of any Quality of Life or Depression concerns found by scoring the questionnaire.;Long Term goal: The participant  improves quality of Life and PHQ9 Scores as seen by post scores and/or verbalization of changes             Quality of Life Scores:  Scores of 19 and below usually indicate a poorer quality of life in these areas.  A difference of  2-3 points is a clinically meaningful difference.  A difference of 2-3 points in the total score of the Quality of Life Index has been associated with significant improvement in overall quality of life, self-image, physical symptoms, and general health in studies assessing change in quality of life.  PHQ-9: Review Flowsheet  More data exists      04/04/2023 01/24/2023 10/26/2022 10/21/2021 11/13/2020  Depression screen PHQ 2/9  Decreased Interest 0 0 0 0 0  Down, Depressed, Hopeless 0 1 0 0 0  PHQ - 2 Score 0 1 0 0 0  Altered  sleeping 0 2 0 0 -  Tired, decreased energy 3 3 3 1  -  Change in appetite 1 1 0 0 -  Feeling bad or failure about yourself  0 0 0 0 -  Trouble concentrating 0 1 0 0 -  Moving slowly or fidgety/restless 0 0 0 0 -  Suicidal thoughts 0 0 0 0 -  PHQ-9 Score 4 8 3 1  -  Difficult doing work/chores Not difficult at all Not difficult at all Not difficult at all Not difficult at all -   Interpretation of Total Score  Total Score Depression Severity:  1-4 = Minimal depression, 5-9 = Mild depression, 10-14 = Moderate depression, 15-19 = Moderately severe depression, 20-27 = Severe depression   Psychosocial Evaluation and Intervention:  Psychosocial Evaluation - 03/21/23 1608       Psychosocial Evaluation & Interventions   Interventions Encouraged to exercise with the program and follow exercise prescription    Comments Wells has no barriers to attending the program. He wants to leave the program breathing better. He has support from his daughter Rosey Bath. He has the desire to breath easier . He is ready to start the program    Expected Outcomes STG Hoyte is able to attend all scheduled sessions, he is able to progress with his exercise prescription and work on breathing techniques to improve his day to day breathing with his daily activities.  LTG Josemanuel continues to work on his exerise ad breathing techniques    Continue Psychosocial Services  Follow up required by staff             Psychosocial Re-Evaluation:   Psychosocial Discharge (Final Psychosocial Re-Evaluation):   Education: Education Goals: Education classes will be provided on a weekly basis, covering required topics. Participant will state understanding/return demonstration of topics presented.  Learning Barriers/Preferences:   General Pulmonary Education Topics:  Infection Prevention: - Provides verbal and written material to individual with discussion of infection control including proper hand washing and proper equipment  cleaning during exercise session. Flowsheet Row Pulmonary Rehab from 04/13/2023 in [facility] Cardiac and Pulmonary Rehab  Date 04/04/23  Educator [facility]  Instruction Review Code 1- Verbalizes Understanding       Falls Prevention: - Provides verbal and written material to individual with discussion of falls prevention and safety. Flowsheet Row Pulmonary Rehab from 04/13/2023 in [facility] Cardiac and Pulmonary Rehab  Date 03/21/23  Educator SB  Instruction Review Code 1- Verbalizes Understanding       Chronic Lung Disease Review: - Group verbal instruction with posters, models, PowerPoint presentations and videos,  to review new  updates, new respiratory medications, new advancements in procedures and treatments. Providing information on websites and "800" numbers for continued self-education. Includes information about supplement oxygen, available portable oxygen systems, continuous and intermittent flow rates, oxygen safety, concentrators, and Medicare reimbursement for oxygen. Explanation of Pulmonary Drugs, including class, frequency, complications, importance of spacers, rinsing mouth after steroid MDI's, and proper cleaning methods for nebulizers. Review of basic lung anatomy and physiology related to function, structure, and complications of lung disease. Review of risk factors. Discussion about methods for diagnosing sleep apnea and types of masks and machines for OSA. Includes a review of the use of types of environmental controls: home humidity, furnaces, filters, dust mite/pet prevention, HEPA vacuums. Discussion about weather changes, air quality and the benefits of nasal washing. Instruction on Warning signs, infection symptoms, calling MD promptly, preventive modes, and value of vaccinations. Review of effective airway clearance, coughing and/or vibration techniques. Emphasizing that all should Create an Action Plan. Written material given at graduation. Flowsheet Row Pulmonary Rehab from 04/13/2023  in [facility] Cardiac and Pulmonary Rehab  Education need identified 04/04/23       AED/CPR: - Group verbal and written instruction with the use of models to demonstrate the basic use of the AED with the basic ABC's of resuscitation.    Anatomy and Cardiac Procedures: - Group verbal and visual presentation and models provide information about basic cardiac anatomy and function. Reviews the testing methods done to diagnose heart disease and the outcomes of the test results. Describes the treatment choices: Medical Management, Angioplasty, or Coronary Bypass Surgery for treating various heart conditions including Myocardial Infarction, Angina, Valve Disease, and Cardiac Arrhythmias.  Written material given at graduation.   Medication Safety: - Group verbal and visual instruction to review commonly prescribed medications for heart and lung disease. Reviews the medication, class of the drug, and side effects. Includes the steps to properly store meds and maintain the prescription regimen.  Written material given at graduation.   Other: -Provides group and verbal instruction on various topics (see comments)   Knowledge Questionnaire Score:  Knowledge Questionnaire Score - 04/04/23 1541       Knowledge Questionnaire Score   Pre Score 16/18              Core Components/Risk Factors/Patient Goals at Admission:  Personal Goals and Risk Factors at Admission - 04/04/23 1541       Core Components/Risk Factors/Patient Goals on Admission    Weight Management Yes;Weight Loss    Intervention Weight Management: Develop a combined nutrition and exercise program designed to reach desired caloric intake, while maintaining appropriate intake of nutrient and fiber, sodium and fats, and appropriate energy expenditure required for the weight goal.;Weight Management: Provide education and appropriate resources to help participant work on and attain dietary goals.;Weight Management/Obesity: Establish  reasonable short term and long term weight goals.    Admit Weight 210 lb 1.6 oz (95.3 kg)    Goal Weight: Short Term 205 lb (93 kg)    Goal Weight: Long Term 200 lb (90.7 kg)    Expected Outcomes Short Term: Continue to assess and modify interventions until short term weight is achieved;Long Term: Adherence to nutrition and physical activity/exercise program aimed toward attainment of established weight goal;Weight Loss: Understanding of general recommendations for a balanced deficit meal plan, which promotes 1-2 lb weight loss per week and includes a negative energy balance of [phone] kcal/d;Understanding recommendations for meals to include 15-35% energy as protein, 25-35% energy from fat, 35-60% energy from  carbohydrates, less than  of dietary cholesterol, 20-35 gm of total fiber daily;Understanding of distribution of calorie intake throughout the day with the consumption of 4-5 meals/snacks    Improve shortness of breath with ADL's Yes    Intervention Provide education, individualized exercise plan and daily activity instruction to help decrease symptoms of SOB with activities of daily living.    Expected Outcomes Short Term: Improve cardiorespiratory fitness to achieve a reduction of symptoms when performing ADLs;Long Term: Be able to perform more ADLs without symptoms or delay the onset of symptoms    Increase knowledge of respiratory medications and ability to use respiratory devices properly  Yes    Intervention Provide education and demonstration as needed of appropriate use of medications, inhalers, and oxygen therapy.    Expected Outcomes Short Term: Achieves understanding of medications use. Understands that oxygen is a medication prescribed by physician. Demonstrates appropriate use of inhaler and oxygen therapy.;Long Term: Maintain appropriate use of medications, inhalers, and oxygen therapy.    Hypertension Yes    Intervention Provide education on lifestyle modifcations including  regular physical activity/exercise, weight management, moderate sodium restriction and increased consumption of fresh fruit, vegetables, and low fat dairy, alcohol moderation, and smoking cessation.;Monitor prescription use compliance.    Expected Outcomes Short Term: Continued assessment and intervention until BP is < 140/77mm HG in hypertensive participants. < 130/63mm HG in hypertensive participants with diabetes, heart failure or chronic kidney disease.;Long Term: Maintenance of blood pressure at goal levels.    Lipids Yes    Intervention Provide education and support for participant on nutrition & aerobic/resistive exercise along with prescribed medications to achieve LDL 70mg , HDL >40mg .    Expected Outcomes Short Term: Participant states understanding of desired cholesterol values and is compliant with medications prescribed. Participant is following exercise prescription and nutrition guidelines.;Long Term: Cholesterol controlled with medications as prescribed, with individualized exercise RX and with personalized nutrition plan. Value goals: LDL < , HDL > 40 mg.             Education:Diabetes - Individual verbal and written instruction to review signs/symptoms of diabetes, desired ranges of glucose level fasting, after meals and with exercise. Acknowledge that pre and post exercise glucose checks will be done for 3 sessions at entry of program.   Know Your Numbers and Heart Failure: - Group verbal and visual instruction to discuss disease risk factors for cardiac and pulmonary disease and treatment options.  Reviews associated critical values for Overweight/Obesity, Hypertension, Cholesterol, and Diabetes.  Discusses basics of heart failure: signs/symptoms and treatments.  Introduces Heart Failure Zone chart for action plan for heart failure.  Written material given at graduation.   Core Components/Risk Factors/Patient Goals Review:    Core Components/Risk Factors/Patient Goals  at Discharge (Final Review):    ITP Comments:  ITP Comments     Row Name 03/21/23 1614 04/04/23 1533 04/06/23 1002 04/13/23 1326     ITP Comments Virtual orientation call completed today. he has an appointment on Date: 04/04/2023  for EP eval and gym Orientation.  Documentation of diagnosis [facility] be found in [facility] Date: [id number] . Completed and gym orientation. Initial ITP created and sent for review to Dr. Jinny Sanders, Medical Director. First full day of exercise!  Patient was oriented to gym and equipment including functions, settings, policies, and procedures.  Patient's individual exercise prescription and treatment plan were reviewed.  All starting workloads were established based on the results of the 6 minute walk test done at initial  orientation visit.  The plan for exercise progression was also introduced and progression will be customized based on patient's performance and goals. 30 day review completed. ITP sent to Dr. Jinny Sanders, Medical Director of  Pulmonary Rehab. Continue with ITP unless changes are made by physician.             Comments: 30 day review

## 2023-04-13 NOTE — Progress Notes (Signed)
Daily Session Note  Patient Details  Name: Daniel Carr MRN: [id number] Date of Birth: 06/25/1935 Referring Provider:   Flowsheet Row Pulmonary Rehab from 04/04/2023 in [facility] Cardiac and Pulmonary Rehab  Referring Provider Mila Merry MD       Encounter Date: 04/13/2023  Check In:  Session Check In - 04/13/23 1007       Check-In   Supervising physician immediately available to respond to emergencies See telemetry face sheet for immediately available ER MD    Location [facility]-Cardiac & Pulmonary Rehab    Staff Present Lanny Hurst, RN, ADN;Joseph Reino Kent, RCP,RRT,BSRT;Noah Tickle, BS, Exercise Physiologist    Virtual Visit No    Medication changes reported     No    Fall or balance concerns reported    No    Warm-up and Cool-down Performed on first and last piece of equipment    Resistance Training Performed Yes    VAD Patient? No    PAD/SET Patient? No      Pain Assessment   Currently in Pain? No/denies                Social History   Tobacco Use  Smoking Status Former   Packs/day: 0.25   Years: 6.00   Additional pack years: 0.00   Total pack years: 1.50   Types: Cigarettes   Quit date: 11/13/1969   Years since quitting: 53.4  Smokeless Tobacco Never  Tobacco Comments   quit in 1970's    Goals Met:  Independence with exercise equipment Exercise tolerated well No report of concerns or symptoms today Strength training completed today  Goals Unmet:  Not Applicable  Comments: Pt able to follow exercise prescription today without complaint.  Will continue to monitor for progression.    Dr. Bethann Punches is Medical Director for [facility] Cardiac Rehabilitation.  Dr. Vida Rigger is Medical Director for [facility] Pulmonary Rehabilitation.

## 2023-04-14 DIAGNOSIS — R0602 Shortness of breath: Secondary | ICD-10-CM | POA: Diagnosis not present

## 2023-04-15 DIAGNOSIS — J986 Disorders of diaphragm: Secondary | ICD-10-CM | POA: Diagnosis not present

## 2023-04-15 DIAGNOSIS — J449 Chronic obstructive pulmonary disease, unspecified: Secondary | ICD-10-CM

## 2023-04-15 NOTE — Progress Notes (Signed)
Daily Session Note  Patient Details  Name: Daniel Carr MRN: [id number] Date of Birth: 22-Oct-1935 Referring Provider:   Flowsheet Row Pulmonary Rehab from 04/04/2023 in [facility] Cardiac and Pulmonary Rehab  Referring Provider Mila Merry MD       Encounter Date: 04/15/2023  Check In:  Session Check In - 04/15/23 0954       Check-In   Supervising physician immediately available to respond to emergencies See telemetry face sheet for immediately available ER MD    Location [facility]-Cardiac & Pulmonary Rehab    Staff Present Harlene Ramus, RN, BSN;Joseph [place], RCP,RRT,BSRT;Jessica [place], [place], RCEP, CCRP, CCET    Virtual Visit No    Medication changes reported     No    Fall or balance concerns reported    No    Tobacco Cessation No Change    Warm-up and Cool-down Performed on first and last piece of equipment    Resistance Training Performed Yes    VAD Patient? No    PAD/SET Patient? No      Pain Assessment   Currently in Pain? No/denies                Social History   Tobacco Use  Smoking Status Former   Packs/day: 0.25   Years: 6.00   Additional pack years: 0.00   Total pack years: 1.50   Types: Cigarettes   Quit date: 11/13/1969   Years since quitting: 53.4  Smokeless Tobacco Never  Tobacco Comments   quit in 1970's    Goals Met:  Proper associated with RPD/PD & O2 Sat Independence with exercise equipment Using PLB without cueing & demonstrates good technique Exercise tolerated well No report of concerns or symptoms today Strength training completed today  Goals Unmet:  Not Applicable  Comments: Pt able to follow exercise prescription today without complaint.  Will continue to monitor for progression.   Dr. Bethann Punches is Medical Director for [facility] Cardiac Rehabilitation.  Dr. Vida Rigger is Medical Director for [facility] Pulmonary Rehabilitation.

## 2023-04-19 DIAGNOSIS — M25473 Effusion, unspecified ankle: Secondary | ICD-10-CM | POA: Diagnosis not present

## 2023-04-19 DIAGNOSIS — R011 Cardiac murmur, unspecified: Secondary | ICD-10-CM | POA: Diagnosis not present

## 2023-04-19 DIAGNOSIS — M069 Rheumatoid arthritis, unspecified: Secondary | ICD-10-CM | POA: Diagnosis not present

## 2023-04-19 DIAGNOSIS — J42 Unspecified chronic bronchitis: Secondary | ICD-10-CM | POA: Diagnosis not present

## 2023-04-19 DIAGNOSIS — I10 Essential (primary) hypertension: Secondary | ICD-10-CM | POA: Diagnosis not present

## 2023-04-19 DIAGNOSIS — I2693 Single subsegmental pulmonary embolism without acute cor pulmonale: Secondary | ICD-10-CM | POA: Diagnosis not present

## 2023-04-19 DIAGNOSIS — J9601 Acute respiratory failure with hypoxia: Secondary | ICD-10-CM | POA: Diagnosis not present

## 2023-04-19 DIAGNOSIS — E785 Hyperlipidemia, unspecified: Secondary | ICD-10-CM | POA: Diagnosis not present

## 2023-04-19 DIAGNOSIS — Z87891 Personal history of nicotine dependence: Secondary | ICD-10-CM | POA: Diagnosis not present

## 2023-04-20 ENCOUNTER — Encounter: Payer: Medicare HMO | Admitting: *Deleted

## 2023-04-20 DIAGNOSIS — J449 Chronic obstructive pulmonary disease, unspecified: Secondary | ICD-10-CM

## 2023-04-20 DIAGNOSIS — J986 Disorders of diaphragm: Secondary | ICD-10-CM

## 2023-04-20 NOTE — Progress Notes (Signed)
Daily Session Note  Patient Details  Name: Wilkins Elpers MRN: [id number] Date of Birth: 1935/12/10 Referring Provider:   Flowsheet Row Pulmonary Rehab from 04/04/2023 in [facility] Cardiac and Pulmonary Rehab  Referring Provider Mila Merry MD       Encounter Date: 04/20/2023  Check In:  Session Check In - 04/20/23 1002       Check-In   Supervising physician immediately available to respond to emergencies See telemetry face sheet for immediately available ER MD    Location [facility]-Cardiac & Pulmonary Rehab    Staff Present Lanny Hurst, RN, ADN;Joseph Reino [place], RCP,RRT,BSRT;Noah Tickle, BS, Exercise Physiologist    Virtual Visit No    Medication changes reported     No    Fall or balance concerns reported    No    Warm-up and Cool-down Performed on first and last piece of equipment    Resistance Training Performed Yes    VAD Patient? No    PAD/SET Patient? No      Pain Assessment   Currently in Pain? No/denies                Social History   Tobacco Use  Smoking Status Former   Packs/day: 0.25   Years: 6.00   Additional pack years: 0.00   Total pack years: 1.50   Types: Cigarettes   Quit date: 11/13/1969   Years since quitting: 53.4  Smokeless Tobacco Never  Tobacco Comments   quit in 1970's    Goals Met:  Independence with exercise equipment Exercise tolerated well No report of concerns or symptoms today Strength training completed today  Goals Unmet:  Not Applicable  Comments: Pt able to follow exercise prescription today without complaint.  Will continue to monitor for progression.    Dr. Bethann Punches is Medical Director for [facility] Cardiac Rehabilitation.  Dr. Vida Rigger is Medical Director for [facility] Pulmonary Rehabilitation.

## 2023-04-21 DIAGNOSIS — J309 Allergic rhinitis, unspecified: Secondary | ICD-10-CM | POA: Diagnosis not present

## 2023-04-21 DIAGNOSIS — I10 Essential (primary) hypertension: Secondary | ICD-10-CM | POA: Diagnosis not present

## 2023-04-21 DIAGNOSIS — J449 Chronic obstructive pulmonary disease, unspecified: Secondary | ICD-10-CM | POA: Diagnosis not present

## 2023-04-22 ENCOUNTER — Encounter: Payer: Medicare HMO | Admitting: *Deleted

## 2023-04-22 DIAGNOSIS — J986 Disorders of diaphragm: Secondary | ICD-10-CM

## 2023-04-22 DIAGNOSIS — J449 Chronic obstructive pulmonary disease, unspecified: Secondary | ICD-10-CM

## 2023-04-22 DIAGNOSIS — Z8739 Personal history of other diseases of the musculoskeletal system and connective tissue: Secondary | ICD-10-CM | POA: Diagnosis not present

## 2023-04-22 DIAGNOSIS — Z09 Encounter for follow-up examination after completed treatment for conditions other than malignant neoplasm: Secondary | ICD-10-CM | POA: Diagnosis not present

## 2023-04-22 NOTE — Progress Notes (Signed)
Daily Session Note  Patient Details  Name: Daniel Carr MRN: [id number] Date of Birth: August 26, 1935 Referring Provider:   Flowsheet Row Pulmonary Rehab from 04/04/2023 in [facility] Cardiac and Pulmonary Rehab  Referring Provider Mila Merry MD       Encounter Date: 04/22/2023  Check In:  Session Check In - 04/22/23 1011       Check-In   Supervising physician immediately available to respond to emergencies See telemetry face sheet for immediately available ER MD    Location [facility]-Cardiac & Pulmonary Rehab    Staff Present Roger Shelter, RN, BSN, CCRP;Sherhonda Gaspar Karleen [place] RN, BSN   Arianna [place], BS & [place], [place]   Virtual Visit No    Medication changes reported     No    Fall or balance concerns reported    No    Tobacco Cessation No Change    Warm-up and Cool-down Performed on first and last piece of equipment    Resistance Training Performed Yes    VAD Patient? No    PAD/SET Patient? No      Pain Assessment   Currently in Pain? No/denies                Social History   Tobacco Use  Smoking Status Former   Packs/day: 0.25   Years: 6.00   Additional pack years: 0.00   Total pack years: 1.50   Types: Cigarettes   Quit date: 11/13/1969   Years since quitting: 53.4  Smokeless Tobacco Never  Tobacco Comments   quit in 1970's    Goals Met:  Independence with exercise equipment Exercise tolerated well No report of concerns or symptoms today Strength training completed today  Goals Unmet:  Not Applicable  Comments: Pt able to follow exercise prescription today without complaint.  Will continue to monitor for progression.    Dr. Bethann Punches is Medical Director for [facility] Cardiac Rehabilitation.  Dr. Vida Rigger is Medical Director for [facility] Pulmonary Rehabilitation.

## 2023-04-25 DIAGNOSIS — M1990 Unspecified osteoarthritis, unspecified site: Secondary | ICD-10-CM | POA: Diagnosis not present

## 2023-04-25 DIAGNOSIS — E7800 Pure hypercholesterolemia, unspecified: Secondary | ICD-10-CM | POA: Diagnosis not present

## 2023-04-25 DIAGNOSIS — E039 Hypothyroidism, unspecified: Secondary | ICD-10-CM | POA: Diagnosis not present

## 2023-04-25 DIAGNOSIS — I10 Essential (primary) hypertension: Secondary | ICD-10-CM | POA: Diagnosis not present

## 2023-04-25 DIAGNOSIS — G894 Chronic pain syndrome: Secondary | ICD-10-CM | POA: Diagnosis not present

## 2023-04-25 DIAGNOSIS — I2510 Atherosclerotic heart disease of native coronary artery without angina pectoris: Secondary | ICD-10-CM | POA: Diagnosis not present

## 2023-04-27 ENCOUNTER — Encounter: Payer: Medicare HMO | Attending: Family Medicine | Admitting: *Deleted

## 2023-04-27 DIAGNOSIS — J449 Chronic obstructive pulmonary disease, unspecified: Secondary | ICD-10-CM | POA: Diagnosis not present

## 2023-04-27 DIAGNOSIS — J986 Disorders of diaphragm: Secondary | ICD-10-CM | POA: Diagnosis not present

## 2023-04-27 NOTE — Progress Notes (Signed)
Daily Session Note  Patient Details  Name: Daniel Carr MRN: [id number] Date of Birth: June 07, 1935 Referring Provider:   Flowsheet Row Pulmonary Rehab from 04/04/2023 in [facility] Cardiac and Pulmonary Rehab  Referring Provider Mila Merry MD       Encounter Date: 04/27/2023  Check In:  Session Check In - 04/27/23 1003       Check-In   Supervising physician immediately available to respond to emergencies See telemetry face sheet for immediately available ER MD    Location [facility]-Cardiac & Pulmonary Rehab    Staff Present Cora Collum, RN, BSN, CCRP;Melissa Marenisco MS, RDN, Margarito Liner, MS, ACSM CEP, Exercise Physiologist;Noah Tickle, BS, Exercise Physiologist    Virtual Visit No    Medication changes reported     No    Fall or balance concerns reported    No    Warm-up and Cool-down Performed on first and last piece of equipment    Resistance Training Performed Yes    VAD Patient? No    PAD/SET Patient? No      Pain Assessment   Currently in Pain? No/denies                Social History   Tobacco Use  Smoking Status Former   Packs/day: 0.25   Years: 6.00   Additional pack years: 0.00   Total pack years: 1.50   Types: Cigarettes   Quit date: 11/13/1969   Years since quitting: 53.4  Smokeless Tobacco Never  Tobacco Comments   quit in 1970's    Goals Met:  Proper associated with RPD/PD & O2 Sat Independence with exercise equipment Exercise tolerated well No report of concerns or symptoms today  Goals Unmet:  Not Applicable  Comments: Pt able to follow exercise prescription today without complaint.  Will continue to monitor for progression.    Dr. Bethann Punches is Medical Director for [facility] Cardiac Rehabilitation.  Dr. Vida Rigger is Medical Director for [facility] Pulmonary Rehabilitation.

## 2023-04-29 ENCOUNTER — Ambulatory Visit
Admission: RE | Admit: 2023-04-29 | Discharge: 2023-04-29 | Disposition: A | Discharge status: Home / Self Care | Payer: Medicare HMO | Source: Home / Self Care | Attending: Cardiovascular Disease | Admitting: Cardiovascular Disease

## 2023-04-29 ENCOUNTER — Other Ambulatory Visit: Payer: Self-pay

## 2023-04-29 ENCOUNTER — Ambulatory Visit: Payer: Medicare HMO | Admitting: Certified Registered Nurse Anesthetist

## 2023-04-29 ENCOUNTER — Encounter
Admission: RE | Disposition: A | Discharge status: Home / Self Care | Payer: Self-pay | Source: Home / Self Care | Attending: Cardiovascular Disease

## 2023-04-29 ENCOUNTER — Ambulatory Visit
Admission: RE | Admit: 2023-04-29 | Discharge: 2023-04-29 | Disposition: A | Discharge status: Home / Self Care | Payer: Medicare HMO | Attending: Cardiovascular Disease | Admitting: Cardiovascular Disease

## 2023-04-29 ENCOUNTER — Encounter: Payer: Self-pay | Admitting: Cardiovascular Disease

## 2023-04-29 DIAGNOSIS — I082 Rheumatic disorders of both aortic and tricuspid valves: Secondary | ICD-10-CM | POA: Insufficient documentation

## 2023-04-29 DIAGNOSIS — Z87891 Personal history of nicotine dependence: Secondary | ICD-10-CM | POA: Diagnosis not present

## 2023-04-29 DIAGNOSIS — I351 Nonrheumatic aortic (valve) insufficiency: Secondary | ICD-10-CM | POA: Diagnosis not present

## 2023-04-29 DIAGNOSIS — R011 Cardiac murmur, unspecified: Secondary | ICD-10-CM | POA: Diagnosis not present

## 2023-04-29 DIAGNOSIS — I350 Nonrheumatic aortic (valve) stenosis: Secondary | ICD-10-CM

## 2023-04-29 DIAGNOSIS — I519 Heart disease, unspecified: Secondary | ICD-10-CM

## 2023-04-29 DIAGNOSIS — I10 Essential (primary) hypertension: Secondary | ICD-10-CM | POA: Diagnosis not present

## 2023-04-29 HISTORY — PX: TEE WITHOUT CARDIOVERSION: SHX5443

## 2023-04-29 SURGERY — ECHOCARDIOGRAM, TRANSESOPHAGEAL
Anesthesia: General

## 2023-04-29 MED ORDER — SODIUM CHLORIDE 0.9 % IV SOLN: INTRAVENOUS | Status: DC

## 2023-04-29 MED ORDER — PHENYLEPHRINE 80 MCG/ML (10ML) SYRINGE FOR IV PUSH (FOR BLOOD PRESSURE SUPPORT)
PREFILLED_SYRINGE | INTRAVENOUS | Status: DC | PRN
  Administered 2023-04-29: 80 ug via INTRAVENOUS

## 2023-04-29 MED ORDER — BUTAMBEN-TETRACAINE-BENZOCAINE 2-2-14 % EX AERO
3.0000 | INHALATION_SPRAY | Freq: Once | CUTANEOUS | Status: AC
  Administered 2023-04-29: 3 via TOPICAL
  Filled 2023-04-29: qty 20

## 2023-04-29 MED ORDER — BUTAMBEN-TETRACAINE-BENZOCAINE 2-2-14 % EX AERO
INHALATION_SPRAY | CUTANEOUS | Status: AC
  Filled 2023-04-29: qty 5

## 2023-04-29 MED ORDER — PROPOFOL 10 MG/ML IV BOLUS
INTRAVENOUS | Status: AC
  Filled 2023-04-29: qty 60

## 2023-04-29 MED ORDER — ATROPINE SULFATE 1 MG/10ML IJ SOSY
PREFILLED_SYRINGE | INTRAMUSCULAR | Status: AC
  Filled 2023-04-29: qty 10

## 2023-04-29 MED ORDER — PROPOFOL 10 MG/ML IV BOLUS
INTRAVENOUS | Status: DC | PRN
  Administered 2023-04-29 (×2): 20 mg via INTRAVENOUS
  Administered 2023-04-29: 10 mg via INTRAVENOUS
  Administered 2023-04-29: 20 mg via INTRAVENOUS
  Administered 2023-04-29: 10 mg via INTRAVENOUS
  Administered 2023-04-29 (×2): 20 mg via INTRAVENOUS
  Administered 2023-04-29: 50 mg via INTRAVENOUS
  Administered 2023-04-29: 10 mg via INTRAVENOUS
  Administered 2023-04-29: 20 mg via INTRAVENOUS
  Administered 2023-04-29: 50 mg via INTRAVENOUS

## 2023-04-29 MED ORDER — EPINEPHRINE 1 MG/10ML IJ SOSY
PREFILLED_SYRINGE | INTRAMUSCULAR | Status: AC
  Filled 2023-04-29: qty 10

## 2023-04-29 NOTE — Anesthesia Procedure Notes (Signed)
Procedure Name: MAC Date/Time: 04/29/2023 12:47 PM  Performed by: Hezzie Bump, CRNAPre-anesthesia Checklist: Patient identified, Emergency Drugs available, Suction available and Patient being monitored Patient Re-evaluated:Patient Re-evaluated prior to induction Oxygen Delivery Method: Simple face mask Induction Type: IV induction Placement Confirmation: positive ETCO2

## 2023-04-29 NOTE — Progress Notes (Signed)
Transesophageal echocardiogram preliminary report  Jeremiha Lapuma [id number] 09/20/1935  Preliminary diagnosis AoV disease  Postprocedural diagnosis Same  Time out A timeout was performed by the nursing staff and physicians specifically identifying the procedure performed, identification of the patient, the type of sedation, all allergies and medications, all pertinent medical history, and presedation assessment of nasopharynx. The patient and or family understand the risks of the procedure including the rare risks of death, stroke, heart attack, esophogeal perforation, sore throat, and reaction to medications given.  Anesthesia CRNA administered propofol to achieve appropriate general anesthesia.  The patient had continued monitoring of heart rate, oxygenation, blood pressure, respiratory rate, and extent of signs of sedation throughout the entire procedure.  The patient received this sedation over a period of 25 minutes.  CRNA, nursing staff and I were present during the procedure when the patient was anesthetized for 100% of the time.  Treatment considerations: Mild AR, probable moderate AS Will communicate findings to pt's primary cardiologist after review of all the images  For further details of transesophageal echocardiogram please refer to final report.  Lenn Cal [facility] MD [facility] 04/29/2023 1:26 PM

## 2023-04-29 NOTE — Anesthesia Postprocedure Evaluation (Signed)
Anesthesia Post Note  Patient: Daniel Carr  Procedure(s) Performed: TRANSESOPHAGEAL ECHOCARDIOGRAM (TEE)  Patient location during evaluation: PACU Anesthesia Type: General Level of consciousness: awake and alert Pain management: pain level controlled Vital Signs Assessment: post-procedure vital signs reviewed and stable Respiratory status: spontaneous breathing, nonlabored ventilation, respiratory function stable and patient connected to nasal cannula oxygen Cardiovascular status: blood pressure returned to baseline and stable Postop Assessment: no apparent nausea or vomiting Anesthetic complications: no   No notable events documented.   Last Vitals:  Vitals:   04/29/23 1330 04/29/23 1345  BP: 98/62 119/79  Pulse: 86 85  Resp: (!) 22 (!) 24  Temp:    SpO2: 100% 97%    Last Pain:  Vitals:   04/29/23 1345  TempSrc:   PainSc: 0-No pain                 Louie Boston

## 2023-04-29 NOTE — Anesthesia Preprocedure Evaluation (Signed)
Anesthesia Evaluation  Patient identified by MRN, date of birth, ID band Patient awake    Reviewed: Allergy & Precautions, H&P , NPO status , Patient's Chart, lab work & pertinent test results, reviewed documented beta blocker date and time   History of Anesthesia Complications Negative for: history of anesthetic complications  Airway Mallampati: II   Neck ROM: full    Dental  (+) Poor Dentition, Dental Advidsory Given   Pulmonary neg pulmonary ROS, former smoker   Pulmonary exam normal        Cardiovascular Exercise Tolerance: Poor hypertension, (-) angina (-) Past MI and (-) Cardiac Stents (-) dysrhythmias + Valvular Problems/Murmurs  Rhythm:regular Rate:Normal     Neuro/Psych neg Seizures  Neuromuscular disease  negative psych ROS   GI/Hepatic negative GI ROS, Neg liver ROS,,,  Endo/Other  neg diabetesHypothyroidism    Renal/GU negative Renal ROS  negative genitourinary   Musculoskeletal   Abdominal   Peds  Hematology negative hematology ROS (+)   Anesthesia Other Findings Past Medical History: No date: Arthritis No date: childhood infection     Comment:  chicken pox, measels, and mumps No date: Hypertension No date: Hypothyroidism No date: Sinus problem No date: Ulnar neuropathy     Comment:  bilateral Past Surgical History: No date: APPENDECTOMY No date: CATARACT EXTRACTION; Bilateral No date: COLONOSCOPY No date: JOINT REPLACEMENT; Right 10/04/2011: KNEE SURGERY; Right     Comment:  replacement 07/21/2017: NM MYOVIEW ([facility] HX)     Comment:  KC. No ischemia.  No date: RETINAL DETACHMENT REPAIR W/ SCLERAL BUCKLE LE No date: TONSILLECTOMY BMI    Body Mass Index: 27.44 kg/m     Reproductive/Obstetrics negative OB ROS                             Anesthesia Physical Anesthesia Plan  ASA: 3  Anesthesia Plan: General   Post-op Pain Management:    Induction:  Intravenous  PONV Risk Score and Plan: 3 and Propofol infusion, TIVA and Treatment may vary due to age or medical condition  Airway Management Planned: Natural Airway and Nasal Cannula  Additional Equipment:   Intra-op Plan:   Post-operative Plan:   Informed Consent: I have reviewed the patients History and Physical, chart, labs and discussed the procedure including the risks, benefits and alternatives for the proposed anesthesia with the patient or authorized representative who has indicated his/her understanding and acceptance.     Dental Advisory Given  Plan Discussed with: CRNA  Anesthesia Plan Comments:         Anesthesia Quick Evaluation

## 2023-04-29 NOTE — H&P (Signed)
PRE-PROCEDURE H and P  Daniel Carr is an 88 y.o. male with aortic valve disease here for TEE. See anesthesia pre-procedure note.   Past Medical History:  Diagnosis Date   Arthritis    childhood infection    chicken pox, measels, and mumps   Glaucoma    Hypertension    Hypothyroidism    Sinus problem    Ulnar neuropathy    bilateral    Past Surgical History:  Procedure Laterality Date   APPENDECTOMY     CATARACT EXTRACTION Bilateral    COLONOSCOPY     JOINT REPLACEMENT Right    KNEE ARTHROPLASTY Left 12/24/2020   Procedure: COMPUTER ASSISTED TOTAL KNEE ARTHROPLASTY;  Surgeon: Donato Heinz, MD;  Location: [facility] ORS;  Service: Orthopedics;  Laterality: Left;  3RD CASE   KNEE SURGERY Right 10/04/2011   replacement   NM MYOVIEW ([facility] HX)  07/21/2017   KC. No ischemia.    RETINAL DETACHMENT REPAIR W/ SCLERAL BUCKLE LE     TONSILLECTOMY      Family History  Problem Relation Age of Onset   Stroke Sister    Hypertension Mother    Stroke Father    Hypertension Father    Social History:  reports that he quit smoking about 53 years ago. His smoking use included cigarettes. He has a 1.50 pack-year smoking history. He has never used smokeless tobacco. He reports that he does not drink alcohol and does not use drugs.  Allergies: No Known Allergies  Medications Prior to Admission  Medication Sig Dispense Refill   aspirin 81 MG EC tablet Take 1 tablet (81 mg total) by mouth daily. Swallow whole.     dutasteride (AVODART) 0.5 MG capsule Take 1 capsule by mouth once daily 90 capsule 4   hydrochlorothiazide (HYDRODIURIL) 25 MG tablet TAKE ONE-HALF TABLET BY MOUTH ONCE DAILY 45 tablet 03   levocetirizine (XYZAL) 5 MG tablet Take 5 mg by mouth every evening.     levothyroxine (SYNTHROID) 150 MCG tablet Take 1 tablet by mouth once daily 90 tablet 4   lovastatin (MEVACOR) 40 MG tablet Take 1 tablet by mouth once daily 90 tablet 5   Multiple Vitamin (DAILY VITAMIN PO) Take 1 tablet by  mouth daily.     traMADol (ULTRAM) 50 MG tablet Take 50 mg by mouth every 6 (six) hours as needed.     vitamin B-12 (CYANOCOBALAMIN) 100 MCG tablet Take 100 mcg by mouth daily. Unsure of dose  is OTC med     amLODipine (NORVASC) 2.5 MG tablet Take 1 tablet by mouth once daily 90 tablet 4   azelastine (ASTELIN) 0.1 % nasal spray Place 1 spray into the nose daily as needed (allergies).     brimonidine (ALPHAGAN) 0.2 % ophthalmic solution Place 1 drop into both eyes in the morning and at bedtime.     clotrimazole-betamethasone (LOTRISONE) cream APPLY TOPICALLY ONCE DAILY (Patient not taking: Reported on 03/21/2023) 15 g 0   COD LIVER OIL PO Take 1 capsule by mouth daily. (Patient not taking: Reported on 03/21/2023)     dorzolamide (TRUSOPT) 2 % ophthalmic solution Apply to eye.     enalapril-hydrochlorothiazide (VASERETIC) 10-25 MG tablet hydroCHLOROthiazide 25mg  (Patient not taking: Reported on 03/21/2023)     ipratropium (ATROVENT) 0.06 % nasal spray Place 1 spray into both nostrils 2 (two) times daily as needed for rhinitis.  6   latanoprost (XALATAN) 0.005 % ophthalmic solution SMARTSIG:In Eye(s)     metoprolol tartrate (LOPRESSOR) 100 MG  tablet Take tablet TWO hours prior to his cardiac CT scan. 1 tablet 0   naproxen (NAPROSYN) 500 MG tablet Take 1 tablet (500 mg total) by mouth 2 (two) times daily with a meal. (Patient not taking: Reported on 03/21/2023) 30 tablet 1   predniSONE (DELTASONE) 5 MG tablet Take by mouth. (Patient not taking: Reported on 04/29/2023)      No results found for this or any previous visit (from the past 48 hour(s)). No results found.  Review of Systems  Constitutional:  Negative for fever.  HENT:  Negative for congestion.   Eyes:  Negative for visual disturbance.  Respiratory:  Negative for shortness of breath.   Cardiovascular:  Negative for chest pain.  Gastrointestinal:  Negative for abdominal pain.  Genitourinary:  Negative for dysuria.  Musculoskeletal:   Negative for neck stiffness.  Skin:  Negative for wound.  Neurological:  Negative for dizziness.  Hematological:  Does not bruise/bleed easily.  Psychiatric/Behavioral:  Negative for agitation.     Blood pressure (!) 144/74, pulse 73, temperature 97.8 F (36.6 C), temperature source Oral, resp. rate 20, height 6' (1.829 m), weight 95.3 kg, SpO2 98 %. Physical Exam Constitutional:      Appearance: Normal appearance.  HENT:     Head: Normocephalic and atraumatic.     Mouth/Throat:     Mouth: Mucous membranes are moist.  Eyes:     General: No scleral icterus. Cardiovascular:     Rate and Rhythm: Regular rhythm.     Heart sounds: Murmur heard.  Pulmonary:     Effort: Pulmonary effort is normal.  Abdominal:     Palpations: Abdomen is soft.  Musculoskeletal:        General: No swelling.     Cervical back: Neck supple.  Skin:    General: Skin is warm and dry.  Neurological:     Mental Status: He is alert. Mental status is at baseline.  Psychiatric:        Mood and Affect: Mood normal.      Assessment/Plan 66 M with aortic valve disease here for TEE  ASA III  Sedation: propofol per CRNA  Risks/benefits d/w patient; informed consent obtained  Tiajuana Amass, MD 04/29/2023, 12:24 PM

## 2023-04-29 NOTE — Progress Notes (Signed)
*  PRELIMINARY RESULTS* Echocardiogram Echocardiogram Transesophageal has been performed.  Carolyne Fiscal 04/29/2023, 1:49 PM

## 2023-04-29 NOTE — Transfer of Care (Signed)
Immediate Anesthesia Transfer of Care Note  Patient: Daniel Carr  Procedure(s) Performed: TRANSESOPHAGEAL ECHOCARDIOGRAM (TEE)  Patient Location: Short Stay  Anesthesia Type:General  Level of Consciousness: drowsy  Airway & Oxygen Therapy: Patient Spontanous Breathing and Patient connected to face mask oxygen  Post-op Assessment: Report given to RN and Post -op Vital signs reviewed and stable  Post vital signs: Reviewed and stable  Last Vitals:  Vitals Value Taken Time  BP 97/58 04/29/23 1323  Temp    Pulse 85 04/29/23 1323  Resp 19 04/29/23 1323  SpO2 100 % 04/29/23 1323  Vitals shown include unvalidated device data.  Last Pain:  Vitals:   04/29/23 1323  TempSrc:   PainSc: Asleep         Complications: No notable events documented.

## 2023-05-02 ENCOUNTER — Encounter: Payer: Self-pay | Admitting: Cardiovascular Disease

## 2023-05-02 LAB — ECHO TEE
AR max vel: 1.04 cm2
AV Area VTI: 1.12 cm2
AV Area mean vel: 1.04 cm2
AV Mean grad: 28 mmHg
AV Peak grad: 49.8 mmHg
Ao pk vel: 3.53 m/s

## 2023-05-03 DIAGNOSIS — R0689 Other abnormalities of breathing: Secondary | ICD-10-CM | POA: Diagnosis not present

## 2023-05-03 DIAGNOSIS — J986 Disorders of diaphragm: Secondary | ICD-10-CM | POA: Diagnosis not present

## 2023-05-03 DIAGNOSIS — G4730 Sleep apnea, unspecified: Secondary | ICD-10-CM | POA: Diagnosis not present

## 2023-05-04 ENCOUNTER — Encounter: Payer: Medicare HMO | Admitting: *Deleted

## 2023-05-04 DIAGNOSIS — J986 Disorders of diaphragm: Secondary | ICD-10-CM | POA: Diagnosis not present

## 2023-05-04 DIAGNOSIS — J449 Chronic obstructive pulmonary disease, unspecified: Secondary | ICD-10-CM

## 2023-05-04 NOTE — Progress Notes (Signed)
Daily Session Note  Patient Details  Name: Daniel Carr MRN: [id number] Date of Birth: 11-08-1935 Referring Provider:   Flowsheet Row Pulmonary Rehab from 04/04/2023 in [facility] Card[facility]  Referring Provider Mila Merry MD       Encounter Date: 05/04/2023  Check In:  Session Check In - 05/04/23 0944       Check-In   Supervising physician immediately available to respond to emergencies See telemetry face sheet for immediately available ER MD    Location [facility]-Cardiac & Pulmonary Rehab    Staff Present Lanny Hurst, RN, ADN;Laureen Manson Passey, BS, RRT, CPFT;Meredith Jewel Baize, RN BSN;Noah Tickle, BS, Exercise Physiologist    Virtual Visit No    Medication changes reported     No    Fall or balance concerns reported    No    Warm-up and Cool-down Performed on first and last piece of equipment    Resistance Training Performed Yes    VAD Patient? No    PAD/SET Patient? No      Pain Assessment   Currently in Pain? No/denies                Social History   Tobacco Use  Smoking Status Former   Packs/day: 0.25   Years: 6.00   Additional pack years: 0.00   Total pack years: 1.50   Types: Cigarettes   Quit date: 11/13/1969   Years since quitting: 53.5  Smokeless Tobacco Never  Tobacco Comments   quit in 1970's    Goals Met:  Independence with exercise equipment Exercise tolerated well No report of concerns or symptoms today Strength training completed today  Goals Unmet:  Not Applicable  Comments: Pt able to follow exercise prescription today without complaint.  Will continue to monitor for progression.  Reviewed home exercise with pt today.  Pt plans to use Eclipse machine at home for exercise.  Reviewed THR, pulse, RPE, sign and symptoms, pulse oximetery and when to call 911 or MD.  Also discussed weather considerations and indoor options.  Pt voiced understanding.   Dr. Bethann Punches is Medical Director for [facility] Cardiac Rehabilitation.  Dr. Vida Rigger is Medical Director for [facility] Pulmonary Rehabilitation.

## 2023-05-06 ENCOUNTER — Encounter: Payer: Medicare HMO | Admitting: *Deleted

## 2023-05-06 DIAGNOSIS — J986 Disorders of diaphragm: Secondary | ICD-10-CM

## 2023-05-06 DIAGNOSIS — J449 Chronic obstructive pulmonary disease, unspecified: Secondary | ICD-10-CM | POA: Diagnosis not present

## 2023-05-06 NOTE — Progress Notes (Signed)
Daily Session Note  Patient Details  Name: Daniel Carr MRN: [id number] Date of Birth: [birth year] Referring Provider:   Flowsheet Row Pulmonary Rehab from 04/04/2023 in [facility] Cardiac and Pulmonary Rehab  Referring Provider Mila Merry MD       Encounter Date: 05/06/2023  Check In:  Session Check In - 05/06/23 1036       Check-In   Supervising physician immediately available to respond to emergencies See telemetry face sheet for immediately available ER MD    Location [facility]-Cardiac & Pulmonary Rehab    Staff Present Cora Collum, RN, BSN, CCRP;Jessica Herlong, MA, RCEP, CCRP, CCET;Joseph Blossom, [place]    Virtual Visit No    Medication changes reported     No    Fall or balance concerns reported    No    Warm-up and Cool-down Performed on first and last piece of equipment    Resistance Training Performed Yes    VAD Patient? No    PAD/SET Patient? No      Pain Assessment   Currently in Pain? No/denies                Social History   Tobacco Use  Smoking Status Former   Packs/day: 0.25   Years: 6.00   Additional pack years: 0.00   Total pack years: 1.50   Types: Cigarettes   Quit date: 11/13/1969   Years since quitting: 53.5  Smokeless Tobacco Never  Tobacco Comments   quit in 1970's    Goals Met:  Proper associated with RPD/PD & O2 Sat Independence with exercise equipment Exercise tolerated well No report of concerns or symptoms today  Goals Unmet:  Not Applicable  Comments: Pt able to follow exercise prescription today without complaint.  Will continue to monitor for progression.    Dr. Bethann Punches is Medical Director for [facility] Cardiac Rehabilitation.  Dr. Vida Rigger is Medical Director for [facility] Pulmonary Rehabilitation.

## 2023-05-09 ENCOUNTER — Other Ambulatory Visit: Payer: Self-pay | Admitting: Family Medicine

## 2023-05-09 DIAGNOSIS — E7800 Pure hypercholesterolemia, unspecified: Secondary | ICD-10-CM

## 2023-05-11 ENCOUNTER — Encounter: Payer: Medicare HMO | Admitting: *Deleted

## 2023-05-11 ENCOUNTER — Encounter: Payer: Self-pay | Admitting: *Deleted

## 2023-05-11 DIAGNOSIS — J449 Chronic obstructive pulmonary disease, unspecified: Secondary | ICD-10-CM

## 2023-05-11 DIAGNOSIS — J986 Disorders of diaphragm: Secondary | ICD-10-CM | POA: Diagnosis not present

## 2023-05-11 NOTE — Progress Notes (Signed)
Pulmonary Individual Treatment Plan  Patient Details  Name: Daniel Carr MRN: [id number] Date of Birth: May 10, 1935 Referring Provider:   Flowsheet Row Pulmonary Rehab from 04/04/2023 in [facility] Cardiac and Pulmonary Rehab  Referring Provider Mila Merry MD       Initial Encounter Date:  Flowsheet Row Pulmonary Rehab from 04/04/2023 in [facility] Cardiac and Pulmonary Rehab  Date 04/04/23       Visit Diagnosis: Stage 3 severe COPD by GOLD classification (HCC)  Diaphragm paralysis  Patient's Home Medications on Admission:  Current Outpatient Medications:    amLODipine (NORVASC) 2.5 MG tablet, Take 1 tablet by mouth once daily, Disp: 90 tablet, Rfl: 4   aspirin 81 MG EC tablet, Take 1 tablet (81 mg total) by mouth daily. Swallow whole., Disp: , Rfl:    azelastine (ASTELIN) 0.1 % nasal spray, Place 1 spray into the nose daily as needed (allergies)., Disp: , Rfl:    brimonidine (ALPHAGAN) 0.2 % ophthalmic solution, Place 1 drop into both eyes in the morning and at bedtime., Disp: , Rfl:    clotrimazole-betamethasone (LOTRISONE) cream, APPLY TOPICALLY ONCE DAILY (Patient not taking: Reported on 03/21/2023), Disp: 15 g, Rfl: 0   COD LIVER OIL PO, Take 1 capsule by mouth daily. (Patient not taking: Reported on 03/21/2023), Disp: , Rfl:    dorzolamide (TRUSOPT) 2 % ophthalmic solution, Apply to eye., Disp: , Rfl:    dutasteride (AVODART) 0.5 MG capsule, Take 1 capsule by mouth once daily, Disp: 90 capsule, Rfl: 4   enalapril-hydrochlorothiazide (VASERETIC) 10-25 MG tablet, hydroCHLOROthiazide 25mg  (Patient not taking: Reported on 03/21/2023), Disp: , Rfl:    hydrochlorothiazide (HYDRODIURIL) 25 MG tablet, TAKE ONE-HALF TABLET BY MOUTH ONCE DAILY, Disp: 45 tablet, Rfl: 03   ipratropium (ATROVENT) 0.06 % nasal spray, Place 1 spray into both nostrils 2 (two) times daily as needed for rhinitis., Disp: , Rfl: 6   latanoprost (XALATAN) 0.005 % ophthalmic solution, SMARTSIG:In Eye(s), Disp: , Rfl:     levocetirizine (XYZAL) 5 MG tablet, Take 5 mg by mouth every evening., Disp: , Rfl:    levothyroxine (SYNTHROID) 150 MCG tablet, Take 1 tablet by mouth once daily, Disp: 90 tablet, Rfl: 4   lovastatin (MEVACOR) 40 MG tablet, Take 1 tablet by mouth once daily, Disp: 90 tablet, Rfl: 3   metoprolol tartrate (LOPRESSOR) 100 MG tablet, Take tablet TWO hours prior to his cardiac CT scan., Disp: 1 tablet, Rfl: 0   Multiple Vitamin (DAILY VITAMIN PO), Take 1 tablet by mouth daily., Disp: , Rfl:    naproxen (NAPROSYN) 500 MG tablet, Take 1 tablet (500 mg total) by mouth 2 (two) times daily with a meal. (Patient not taking: Reported on 03/21/2023), Disp: 30 tablet, Rfl: 1   predniSONE (DELTASONE) 5 MG tablet, Take by mouth. (Patient not taking: Reported on 04/29/2023), Disp: , Rfl:    traMADol (ULTRAM) 50 MG tablet, Take 50 mg by mouth every 6 (six) hours as needed., Disp: , Rfl:    vitamin B-12 (CYANOCOBALAMIN) 100 MCG tablet, Take 100 mcg by mouth daily. Unsure of dose  is OTC med, Disp: , Rfl:   Past Medical History: Past Medical History:  Diagnosis Date   Arthritis    childhood infection    chicken pox, measels, and mumps   Glaucoma    Hypertension    Hypothyroidism    Sinus problem    Ulnar neuropathy    bilateral    Tobacco Use: Social History   Tobacco Use  Smoking Status Former  Packs/day: 0.25   Years: 6.00   Additional pack years: 0.00   Total pack years: 1.50   Types: Cigarettes   Quit date: 11/13/1969   Years since quitting: 53.5  Smokeless Tobacco Never  Tobacco Comments   quit in 1970's    Labs: Review Flowsheet  More data exists      Latest Ref Rng & Units 06/11/2019 07/22/2020 07/06/2021 03/29/2022 10/26/2022  Labs for ITP Cardiac and Pulmonary Rehab  Cholestrol 100 - 199 mg/dL 562  130  865  784  696   LDL (calc) 0 - 99 mg/dL 76  64  74  66  71   HDL-C >39 mg/dL 65  68  76  81  90   Trlycerides 0 - 149 mg/dL 76  89  61  60  57      Pulmonary Assessment Scores:   Pulmonary Assessment Scores     Row Name 04/04/23 1543         ADL UCSD   ADL Phase Entry     SOB Score total 72     Rest 3     Walk 4     Stairs 4     Bath 2     Dress 2     Shop 3       CAT Score   CAT Score 17       mMRC Score   mMRC Score 4              UCSD: Self-administered rating of dyspnea associated with activities of daily living (ADLs) 6-point scale (0 = "not at all" to 5 = "maximal or unable to do because of breathlessness")  Scoring Scores range from 0 to 120.  Minimally important difference is 5 units  CAT: CAT can identify the health impairment of COPD patients and is better correlated with disease progression.  CAT has a scoring range of zero to 40. The CAT score is classified into four groups of low (less than 10), medium (10 - 20), high (21-30) and very high (31-40) based on the impact level of disease on health status. A CAT score over 10 suggests significant symptoms.  A worsening CAT score could be explained by an exacerbation, poor medication adherence, poor inhaler technique, or progression of COPD or comorbid conditions.  CAT MCID is 2 points  mMRC: mMRC (Modified Medical Research Council) Dyspnea Scale is used to assess the degree of baseline functional disability in patients of respiratory disease due to dyspnea. No minimal important difference is established. A decrease in score of 1 point or greater is considered a positive change.   Pulmonary Function Assessment:  Pulmonary Function Assessment - 04/04/23 1543       Pulmonary Function Tests   FVC% 2.28 %   test from 02/04/21   FEV1% 1.17 %    FEV1/FVC Ratio 51.52      Breath   Shortness of Breath Yes;Limiting activity             Exercise Target Goals: Exercise Program Goal: Individual exercise prescription set using results from initial 6 min walk test and THRR while considering  patient's activity barriers and safety.   Exercise Prescription Goal: Initial exercise  prescription builds to 30-45 minutes a day of aerobic activity, 2-3 days per week.  Home exercise guidelines will be given to patient during program as part of exercise prescription that the participant will acknowledge.  Education: Aerobic Exercise: - Group verbal and visual presentation on the  components of exercise prescription. Introduces F.I.T.T principle from ACSM for exercise prescriptions.  Reviews F.I.T.T. principles of aerobic exercise including progression. Written material given at graduation.   Education: Resistance Exercise: - Group verbal and visual presentation on the components of exercise prescription. Introduces F.I.T.T principle from ACSM for exercise prescriptions  Reviews F.I.T.T. principles of resistance exercise including progression. Written material given at graduation.    Education: Exercise & Equipment Safety: - Individual verbal instruction and demonstration of equipment use and safety with use of the equipment. Flowsheet Row Pulmonary Rehab from 04/13/2023 in [facility] Cardiac and Pulmonary Rehab  Date 04/04/23  Educator [facility]  Instruction Review Code 1- Verbalizes Understanding       Education: Exercise Physiology & General Exercise Guidelines: - Group verbal and written instruction with models to review the exercise physiology of the cardiovascular system and associated critical values. Provides general exercise guidelines with specific guidelines to those with heart or lung disease.    Education: Flexibility, Balance, Mind/Body Relaxation: - Group verbal and visual presentation with interactive activity on the components of exercise prescription. Introduces F.I.T.T principle from ACSM for exercise prescriptions. Reviews F.I.T.T. principles of flexibility and balance exercise training including progression. Also discusses the mind body connection.  Reviews various relaxation techniques to help reduce and manage stress (i.e. Deep breathing, progressive muscle  relaxation, and visualization). Balance handout provided to take home. Written material given at graduation.   Activity Barriers & Risk Stratification:  Activity Barriers & Cardiac Risk Stratification - 04/04/23 1535       Activity Barriers & Cardiac Risk Stratification   Activity Barriers Joint Problems;Right Knee Replacement;Left Knee Replacement;Arthritis;Muscular Weakness;Deconditioning;Balance Concerns;Other (comment);Shortness of Breath;History of Falls;Assistive Device    Comments Arthritis all over causing deformities in hands, neuropathy in feet             6 Minute Walk:  6 Minute Walk     Row Name 04/04/23 1533         6 Minute Walk   Phase Initial     Distance 310 feet     Walk Time 5 minutes     # of Rest Breaks 1  1 minute rest     MPH 0.7     METS 0.51     RPE 13     Perceived Dyspnea  3     VO2 Peak 1.77     Symptoms Yes (comment)     Comments fatigue, SOB, chronic foot pain     Resting HR 75 bpm     Resting BP 134/66     Resting Oxygen Saturation  94 %     Exercise Oxygen Saturation  during 6 min walk 87 %     Max Ex. HR 102 bpm     Max Ex. BP 154/72     2 Minute Post BP 138/70       Interval HR   1 Minute HR 94     2 Minute HR 98     3 Minute HR 99     4 Minute HR 96     5 Minute HR 101     6 Minute HR 102     2 Minute Post HR 79     Interval Heart Rate? Yes       Interval Oxygen   Interval Oxygen? Yes     Baseline Oxygen Saturation % 94 %     1 Minute Oxygen Saturation % 89 %     1 Minute Liters of  Oxygen 0 L  Room Air     2 Minute Oxygen Saturation % 87 %     2 Minute Liters of Oxygen 0 L     3 Minute Oxygen Saturation % 84 %     3 Minute Liters of Oxygen 0 L     4 Minute Oxygen Saturation % 88 %     4 Minute Liters of Oxygen 0 L     5 Minute Oxygen Saturation % 87 %     5 Minute Liters of Oxygen 0 L     6 Minute Oxygen Saturation % 89 %     6 Minute Liters of Oxygen 0 L     2 Minute Post Oxygen Saturation % 98 %     2 Minute  Post Liters of Oxygen 0 L             Oxygen Initial Assessment:  Oxygen Initial Assessment - 04/04/23 1542       Home Oxygen   Home Oxygen Device None    Sleep Oxygen Prescription None    Home Exercise Oxygen Prescription None    Home Resting Oxygen Prescription None    Compliance with Home Oxygen Use Yes      Initial 6 min Walk   Oxygen Used None      Program Oxygen Prescription   Program Oxygen Prescription None      Intervention   Short Term Goals To learn and demonstrate proper pursed lip breathing techniques or other breathing techniques. ;To learn and understand importance of maintaining oxygen saturations>88%;To learn and demonstrate proper use of respiratory medications;To learn and understand importance of monitoring SPO2 with pulse oximeter and demonstrate accurate use of the pulse oximeter.    Long  Term Goals Exhibits proper breathing techniques, such as pursed lip breathing or other method taught during program session;Compliance with respiratory medication;Demonstrates proper use of MDI's;Maintenance of O2 saturations>88%;Verbalizes importance of monitoring SPO2 with pulse oximeter and return demonstration             Oxygen Re-Evaluation:  Oxygen Re-Evaluation     Row Name 04/06/23 1004 05/04/23 1055           Program Oxygen Prescription   Program Oxygen Prescription -- None        Home Oxygen   Home Oxygen Device -- None      Sleep Oxygen Prescription -- None      Home Exercise Oxygen Prescription -- None      Home Resting Oxygen Prescription -- None      Compliance with Home Oxygen Use -- Yes        Goals/Expected Outcomes   Short Term Goals -- To learn and demonstrate proper pursed lip breathing techniques or other breathing techniques. ;To learn and understand importance of maintaining oxygen saturations>88%;To learn and demonstrate proper use of respiratory medications;To learn and understand importance of monitoring SPO2 with pulse oximeter  and demonstrate accurate use of the pulse oximeter.      Long  Term Goals -- Exhibits proper breathing techniques, such as pursed lip breathing or other method taught during program session;Compliance with respiratory medication;Demonstrates proper use of MDI's;Maintenance of O2 saturations>88%;Verbalizes importance of monitoring SPO2 with pulse oximeter and return demonstration      Comments Reviewed PLB technique with pt.  Talked about how it works and it's importance in maintaining their exercise saturations. Daniel Carr is trying to remember to use his PLB when needed. He takes breaks and sits as needed. He  hasn't noticed a difference in his breathing since starting the program yet, but is optimistic. He uses his pulse ox sometimes. He is on the list for a sleep study but doesn't know when it will be since they are a few months behind on the schedule      Goals/Expected Outcomes Short: Become more profiecient at using PLB. Long: Become independent at using PLB. Short: get his sleep study scheduled and practice his PLB. Long: independently manage his breathing techniques               Oxygen Discharge (Final Oxygen Re-Evaluation):  Oxygen Re-Evaluation - 05/04/23 1055       Program Oxygen Prescription   Program Oxygen Prescription None      Home Oxygen   Home Oxygen Device None    Sleep Oxygen Prescription None    Home Exercise Oxygen Prescription None    Home Resting Oxygen Prescription None    Compliance with Home Oxygen Use Yes      Goals/Expected Outcomes   Short Term Goals To learn and demonstrate proper pursed lip breathing techniques or other breathing techniques. ;To learn and understand importance of maintaining oxygen saturations>88%;To learn and demonstrate proper use of respiratory medications;To learn and understand importance of monitoring SPO2 with pulse oximeter and demonstrate accurate use of the pulse oximeter.    Long  Term Goals Exhibits proper breathing techniques, such  as pursed lip breathing or other method taught during program session;Compliance with respiratory medication;Demonstrates proper use of MDI's;Maintenance of O2 saturations>88%;Verbalizes importance of monitoring SPO2 with pulse oximeter and return demonstration    Comments Daniel Carr is trying to remember to use his PLB when needed. He takes breaks and sits as needed. He hasn't noticed a difference in his breathing since starting the program yet, but is optimistic. He uses his pulse ox sometimes. He is on the list for a sleep study but doesn't know when it will be since they are a few months behind on the schedule    Goals/Expected Outcomes Short: get his sleep study scheduled and practice his PLB. Long: independently manage his breathing techniques             Initial Exercise Prescription:  Initial Exercise Prescription - 04/04/23 1500       Date of Initial Exercise RX and Referring Provider   Date 04/04/23    Referring Provider Mila Merry MD      Oxygen   Maintain Oxygen Saturation 88% or higher      Recumbant Bike   Level 1    RPM 50    Watts 5    Minutes 15    METs 2      NuStep   Level 1    SPM 80    Minutes 15    METs 2      T5 Nustep   Level 1    SPM 80    Minutes 1    METs 2      Biostep-RELP   Level 1    SPM 50    Minutes 15    METs 2      Prescription Details   Frequency (times per week) 2    Duration Progress to 30 minutes of continuous aerobic without signs/symptoms of physical distress      Intensity   THRR 40-80% of Max Heartrate 99-122    Ratings of Perceived Exertion 11-13    Perceived Dyspnea 0-4      Progression   Progression Continue  to progress workloads to maintain intensity without signs/symptoms of physical distress.      Resistance Training   Training Prescription Yes    Weight 3 lb    Reps 10-15             Perform Capillary Blood Glucose checks as needed.  Exercise Prescription Changes:   Exercise Prescription Changes      Row Name 04/04/23 1500 04/11/23 1300 04/25/23 1400 05/04/23 1000 05/09/23 1400     Response to Exercise   Blood Pressure (Admit) 134/66 128/64 114/60 -- --   Blood Pressure (Exercise) 154/72 136/70 146/60 -- --   Blood Pressure (Exit) 138/70 122/60 128/62 -- --   Heart Rate (Admit) 75 bpm 90 bpm 78 bpm -- 70 bpm   Heart Rate (Exercise) 102 bpm 100 bpm 102 bpm -- 111 bpm   Heart Rate (Exit) 80 bpm 100 bpm 89 bpm -- 82 bpm   Oxygen Saturation (Admit) 94 % 90 % 95 % -- 92 %   Oxygen Saturation (Exercise) 87 % 90 % 89 % -- 88 %   Oxygen Saturation (Exit) 95 % 95 % 97 % -- 96 %   Rating of Perceived Exertion (Exercise) 13 13 15  -- 13   Perceived Dyspnea (Exercise) 3 3 3  -- 3   Symptoms SOB, chronic foot pain SOB SOB -- SOB   Comments walk test results First full day of exercise -- -- --   Duration -- Progress to 30 minutes of  aerobic without signs/symptoms of physical distress Progress to 30 minutes of  aerobic without signs/symptoms of physical distress -- Progress to 30 minutes of  aerobic without signs/symptoms of physical distress   Intensity -- THRR unchanged THRR unchanged -- THRR unchanged     Progression   Progression -- Continue to progress workloads to maintain intensity without signs/symptoms of physical distress. Continue to progress workloads to maintain intensity without signs/symptoms of physical distress. -- Continue to progress workloads to maintain intensity without signs/symptoms of physical distress.   Average METs -- 2 2.53 -- 2.5     Resistance Training   Training Prescription -- Yes Yes -- Yes   Weight -- 3 lb 3 lb -- 3 lb   Reps -- 10-15 10-15 -- 10-15     Interval Training   Interval Training -- No No -- No     Treadmill   MPH -- -- 0.5 -- --   Grade -- -- 0 -- --   Minutes -- -- 15 -- --   METs -- -- 1.38 -- --     Recumbant Bike   Level -- 1 1 -- 2   Watts -- 25 25 -- 18   Minutes -- 15 15 -- 15   METs -- -- 2.82 -- --     NuStep   Level -- -- 3  -- 3   Minutes -- -- 15 -- 15   METs -- -- 3.5 -- 2.7     REL-XR   Level -- -- 1 -- --   Minutes -- -- 15 -- --     T5 Nustep   Level -- 1 1  level 2 for 5 minutes -- 1   Minutes -- 15 15 -- 15   METs -- 2 -- -- 2.1     Biostep-RELP   Level -- -- 1 -- 1   Minutes -- -- 15 -- 15   METs -- -- 3 -- 3     Home Exercise  Plan   Plans to continue exercise at -- -- -- Home (comment)  [employer] Machine Home (comment)  [employer] Machine   Frequency -- -- -- Add 2 additional days to program exercise sessions. Add 2 additional days to program exercise sessions.   Initial Home Exercises Provided -- -- -- 05/04/23 05/04/23     Oxygen   Maintain Oxygen Saturation -- 88% or higher 88% or higher -- 88% or higher            Exercise Comments:   Exercise Comments     Row Name 04/06/23 1003           Exercise Comments First full day of exercise!  Patient was oriented to gym and equipment including functions, settings, policies, and procedures.  Patient's individual exercise prescription and treatment plan were reviewed.  All starting workloads were established based on the results of the 6 minute walk test done at initial orientation visit.  The plan for exercise progression was also introduced and progression will be customized based on patient's performance and goals.                Exercise Goals and Review:   Exercise Goals     Row Name 04/04/23 1538             Exercise Goals   Increase Physical Activity Yes       Intervention Provide advice, education, support and counseling about physical activity/exercise needs.;Develop an individualized exercise prescription for aerobic and resistive training based on initial evaluation findings, risk stratification, comorbidities and participant's personal goals.       Expected Outcomes Long Term: Add in home exercise to make exercise part of routine and to increase amount of physical activity.;Long Term: Exercising regularly at least  3-5 days a week.;Short Term: Attend rehab on a regular basis to increase amount of physical activity.       Increase Strength and Stamina Yes       Intervention Provide advice, education, support and counseling about physical activity/exercise needs.;Develop an individualized exercise prescription for aerobic and resistive training based on initial evaluation findings, risk stratification, comorbidities and participant's personal goals.       Expected Outcomes Short Term: Perform resistance training exercises routinely during rehab and add in resistance training at home;Short Term: Increase workloads from initial exercise prescription for resistance, speed, and METs.;Long Term: Improve cardiorespiratory fitness, muscular endurance and strength as measured by increased METs and functional capacity ( )       Able to understand and use rate of perceived exertion (RPE) scale Yes       Intervention Provide education and explanation on how to use RPE scale       Expected Outcomes Short Term: Able to use RPE daily in rehab to express subjective intensity level;Long Term:  Able to use RPE to guide intensity level when exercising independently       Able to understand and use Dyspnea scale Yes       Intervention Provide education and explanation on how to use Dyspnea scale       Expected Outcomes Long Term: Able to use Dyspnea scale to guide intensity level when exercising independently;Short Term: Able to use Dyspnea scale daily in rehab to express subjective sense of shortness of breath during exertion       Knowledge and understanding of Target Heart Rate Range (THRR) Yes       Intervention Provide education and explanation of THRR including how the numbers were predicted  and where they are located for reference       Expected Outcomes Short Term: Able to state/look up THRR;Long Term: Able to use THRR to govern intensity when exercising independently;Short Term: Able to use daily as guideline for intensity  in rehab       Able to check pulse independently Yes       Intervention Provide education and demonstration on how to check pulse in carotid and radial arteries.;Review the importance of being able to check your own pulse for safety during independent exercise       Expected Outcomes Short Term: Able to explain why pulse checking is important during independent exercise;Long Term: Able to check pulse independently and accurately       Understanding of Exercise Prescription Yes       Intervention Provide education, explanation, and written materials on patient's individual exercise prescription       Expected Outcomes Short Term: Able to explain program exercise prescription;Long Term: Able to explain home exercise prescription to exercise independently                Exercise Goals Re-Evaluation :  Exercise Goals Re-Evaluation     Row Name 04/06/23 1003 04/11/23 1352 04/25/23 1454 05/04/23 1029 05/09/23 1409     Exercise Goal Re-Evaluation   Exercise Goals Review Able to understand and use rate of perceived exertion (RPE) scale;Able to understand and use Dyspnea scale;Knowledge and understanding of Target Heart Rate Range (THRR);Understanding of Exercise Prescription Increase Physical Activity;Increase Strength and Stamina;Understanding of Exercise Prescription Increase Physical Activity;Increase Strength and Stamina;Understanding of Exercise Prescription Increase Physical Activity;Increase Strength and Stamina;Understanding of Exercise Prescription;Able to understand and use rate of perceived exertion (RPE) scale;Knowledge and understanding of Target Heart Rate Range (THRR);Able to understand and use Dyspnea scale;Able to check pulse independently Increase Physical Activity;Increase Strength and Stamina;Understanding of Exercise Prescription   Comments Reviewed RPE scale, THR and program prescription with pt today.  Pt voiced understanding and was given a copy of goals to take home. Cobi is  off to a good start in rehab. During his first day of exercise he was able to work at level 1 on both the T5 nustep and the recumbent bike. He also tolerated using 3 lb hand weights for resistance training. We will continue to monitor his progress in the program. Daniel Carr continues to do well in rehab. He tried out level 2 on the T5 Nustep for about 5 minutes. He also increased to level 3 on the T4 Nustep. Daniel Carr tried out the treadmill and was able to walk at a 0.5 mph speed. He had appropriate RPEs throughout his sessions. We will continue to monitoras he progresses. Daniel Carr is doing well in rehab.  He is already doing some exercise at home on his own.  Reviewed home exercise with pt today.  Pt plans to use Eclipse machine at home for exercise.  Reviewed THR, pulse, RPE, sign and symptoms, pulse oximetery and when to call 911 or MD.  Also discussed weather considerations and indoor options.  Pt voiced understanding. Daniel Carr continues to do well in rehab. He continues to work at an overall average MET level above 2.5 METs. He also continues to work at level 1 on the T5 nustep and biostep, and level 2 on the recumbent bike and T4 nustep. We will continue to monitor his progress in the program.   Expected Outcomes Short: Use RPE daily to regulate intensity. Long: Follow program prescription in THR. Short: Continue to follow  current exercise prescription. Long: Continue to improve strength and stamina. Short: Slowly increasing treadmill speed Long: Continue to increase overall MET level and stamina Short: Start to add in exercise at home routinely Long: Continue to exercise independently Short: Begin to progressively increase workloads. Long: Continue to improve strength and stamina.            Discharge Exercise Prescription (Final Exercise Prescription Changes):  Exercise Prescription Changes - 05/09/23 1400       Response to Exercise   Heart Rate (Admit) 70 bpm    Heart Rate (Exercise) 111 bpm    Heart Rate  (Exit) 82 bpm    Oxygen Saturation (Admit) 92 %    Oxygen Saturation (Exercise) 88 %    Oxygen Saturation (Exit) 96 %    Rating of Perceived Exertion (Exercise) 13    Perceived Dyspnea (Exercise) 3    Symptoms SOB    Duration Progress to 30 minutes of  aerobic without signs/symptoms of physical distress    Intensity THRR unchanged      Progression   Progression Continue to progress workloads to maintain intensity without signs/symptoms of physical distress.    Average METs 2.5      Resistance Training   Training Prescription Yes    Weight 3 lb    Reps 10-15      Interval Training   Interval Training No      Recumbant Bike   Level 2    Watts 18    Minutes 15      NuStep   Level 3    Minutes 15    METs 2.7      T5 Nustep   Level 1    Minutes 15    METs 2.1      Biostep-RELP   Level 1    Minutes 15    METs 3      Home Exercise Plan   Plans to continue exercise at Home (comment)   Elcipse Machine   Frequency Add 2 additional days to program exercise sessions.    Initial Home Exercises Provided 05/04/23      Oxygen   Maintain Oxygen Saturation 88% or higher             Nutrition:  Target Goals: Understanding of nutrition guidelines, daily intake of sodium 1500mg , cholesterol 200mg , calories 30% from fat and 7% or less from saturated fats, daily to have 5 or more servings of fruits and vegetables.  Education: All About Nutrition: -Group instruction provided by verbal, written material, interactive activities, discussions, models, and posters to present general guidelines for heart healthy nutrition including fat, fiber, MyPlate, the role of sodium in heart healthy nutrition, utilization of the nutrition label, and utilization of this knowledge for meal planning. Follow up email sent as well. Written material given at graduation. Flowsheet Row Pulmonary Rehab from 04/13/2023 in [facility] Cardiac and Pulmonary Rehab  Date --  [part 2]       Biometrics:  Pre  Biometrics - 04/06/23 1009       Pre Biometrics   Waist Circumference 32 inches    Hip Circumference 43 inches    Waist to Hip Ratio 0.74 %              Nutrition Therapy Plan and Nutrition Goals:  Nutrition Therapy & Goals - 05/04/23 1042       Nutrition Therapy   RD appointment deferred Yes  Nutrition Assessments:  MEDIFICTS Score Key: ?70 Need to make dietary changes  40-70 Heart Healthy Diet ? 40 Therapeutic Level Cholesterol Diet  Flowsheet Row Pulmonary Rehab from 04/04/2023 in [facility] Cardiac and Pulmonary Rehab  Picture Your Plate Total Score on Admission 55      Picture Your Plate Scores: <16 Unhealthy dietary pattern with much room for improvement. 41-50 Dietary pattern unlikely to meet recommendations for good health and room for improvement. 51-60 More healthful dietary pattern, with some room for improvement.  >60 Healthy dietary pattern, although there may be some specific behaviors that could be improved.   Nutrition Goals Re-Evaluation:  Nutrition Goals Re-Evaluation     Row Name 05/04/23 1041             Goals   Comment Nimrod's family does most of the cooking for he and his bed bound wife. He mentions he eats well and has no nutrition concerns. He does not want to meet with the dietician at this time. He states his appetite is good and he feels like his needs are being met       Expected Outcome Short: continue maitenance with diet  Long: continue to follow a healthy pulmonary diet.                Nutrition Goals Discharge (Final Nutrition Goals Re-Evaluation):  Nutrition Goals Re-Evaluation - 05/04/23 1041       Goals   Comment Daniel Carr's family does most of the cooking for he and his bed bound wife. He mentions he eats well and has no nutrition concerns. He does not want to meet with the dietician at this time. He states his appetite is good and he feels like his needs are being met    Expected Outcome Short: continue  maitenance with diet  Long: continue to follow a healthy pulmonary diet.             Psychosocial: Target Goals: Acknowledge presence or absence of significant depression and/or stress, maximize coping skills, provide positive support system. Participant is able to verbalize types and ability to use techniques and skills needed for reducing stress and depression.   Education: Stress, Anxiety, and Depression - Group verbal and visual presentation to define topics covered.  Reviews how body is impacted by stress, anxiety, and depression.  Also discusses healthy ways to reduce stress and to treat/manage anxiety and depression.  Written material given at graduation.   Education: Sleep Hygiene -Provides group verbal and written instruction about how sleep can affect your health.  Define sleep hygiene, discuss sleep cycles and impact of sleep habits. Review good sleep hygiene tips.    Initial Review & Psychosocial Screening:  Initial Psych Review & Screening - 03/21/23 1556       Initial Review   Current issues with None Identified      Family Dynamics   Good Support System? Yes   daughter, Rosey Bath     Barriers   Psychosocial barriers to participate in program There are no identifiable barriers or psychosocial needs.      Screening Interventions   Interventions Encouraged to exercise;To provide support and resources with identified psychosocial needs;Provide feedback about the scores to participant    Expected Outcomes Short Term goal: Utilizing psychosocial counselor, staff and physician to assist with identification of specific Stressors or current issues interfering with healing process. Setting desired goal for each stressor or current issue identified.;Long Term Goal: Stressors or current issues are controlled or eliminated.;Short Term goal: Identification  and review with participant of any Quality of Life or Depression concerns found by scoring the questionnaire.;Long Term goal: The  participant improves quality of Life and PHQ9 Scores as seen by post scores and/or verbalization of changes             Quality of Life Scores:  Scores of 19 and below usually indicate a poorer quality of life in these areas.  A difference of  2-3 points is a clinically meaningful difference.  A difference of 2-3 points in the total score of the Quality of Life Index has been associated with significant improvement in overall quality of life, self-image, physical symptoms, and general health in studies assessing change in quality of life.  PHQ-9: Review Flowsheet  More data exists      04/04/2023 01/24/2023 10/26/2022 10/21/2021 11/13/2020  Depression screen PHQ 2/9  Decreased Interest 0 0 0 0 0  Down, Depressed, Hopeless 0 1 0 0 0  PHQ - 2 Score 0 1 0 0 0  Altered sleeping 0 2 0 0 -  Tired, decreased energy 3 3 3 1  -  Change in appetite 1 1 0 0 -  Feeling bad or failure about yourself  0 0 0 0 -  Trouble concentrating 0 1 0 0 -  Moving slowly or fidgety/restless 0 0 0 0 -  Suicidal thoughts 0 0 0 0 -  PHQ-9 Score 4 8 3 1  -  Difficult doing work/chores Not difficult at all Not difficult at all Not difficult at all Not difficult at all -   Interpretation of Total Score  Total Score Depression Severity:  1-4 = Minimal depression, 5-9 = Mild depression, 10-14 = Moderate depression, 15-19 = Moderately severe depression, 20-27 = Severe depression   Psychosocial Evaluation and Intervention:  Psychosocial Evaluation - 03/21/23 1608       Psychosocial Evaluation & Interventions   Interventions Encouraged to exercise with the program and follow exercise prescription    Comments Daniel Carr has no barriers to attending the program. He wants to leave the program breathing better. He has support from his daughter Rosey Bath. He has the desire to breath easier . He is ready to start the program    Expected Outcomes STG Daniel Carr is able to attend all scheduled sessions, he is able to progress with his  exercise prescription and work on breathing techniques to improve his day to day breathing with his daily activities.  LTG Daniel Carr continues to work on his exerise ad breathing techniques    Continue Psychosocial Services  Follow up required by staff             Psychosocial Re-Evaluation:  Psychosocial Re-Evaluation     Row Name 05/04/23 1027             Psychosocial Re-Evaluation   Current issues with Current Stress Concerns;Current Sleep Concerns       Comments Daniel Carr reports his biggest stressor is managing his wife's care with Alzheimers. She has had it for a few years and is now bed bound. He has his daughter and aides helping him out. They help with shopping and some chores as well as stay the night with his wife. He worries about his wife and mentions he knows he needs to take care of himself as well. He reports he thinks he sleeps very well, however his doctor is sending him for a sleep study. They are a few months out on the schedule, so he doesn't know when he will go  back for the actual study. He stays busy with his doctors appointments and caring for his wife. He does know that this program is good for him because it is making him take time for himself.       Expected Outcomes Short: attend pulmonary rehab consistently for exercise and education on breathing techniques and lifestyle changes. Long: independently maintain positive self care habits.       Interventions Stress management education       Continue Psychosocial Services  Follow up required by staff                Psychosocial Discharge (Final Psychosocial Re-Evaluation):  Psychosocial Re-Evaluation - 05/04/23 1027       Psychosocial Re-Evaluation   Current issues with Current Stress Concerns;Current Sleep Concerns    Comments Daniel Carr reports his biggest stressor is managing his wife's care with Alzheimers. She has had it for a few years and is now bed bound. He has his daughter and aides helping him out. They  help with shopping and some chores as well as stay the night with his wife. He worries about his wife and mentions he knows he needs to take care of himself as well. He reports he thinks he sleeps very well, however his doctor is sending him for a sleep study. They are a few months out on the schedule, so he doesn't know when he will go back for the actual study. He stays busy with his doctors appointments and caring for his wife. He does know that this program is good for him because it is making him take time for himself.    Expected Outcomes Short: attend pulmonary rehab consistently for exercise and education on breathing techniques and lifestyle changes. Long: independently maintain positive self care habits.    Interventions Stress management education    Continue Psychosocial Services  Follow up required by staff             Education: Education Goals: Education classes will be provided on a weekly basis, covering required topics. Participant will state understanding/return demonstration of topics presented.  Learning Barriers/Preferences:   General Pulmonary Education Topics:  Infection Prevention: - Provides verbal and written material to individual with discussion of infection control including proper hand washing and proper equipment cleaning during exercise session. Flowsheet Row Pulmonary Rehab from 04/13/2023 in [facility] Cardiac and Pulmonary Rehab  Date 04/04/23  Educator [facility]  Instruction Review Code 1- Verbalizes Understanding       Falls Prevention: - Provides verbal and written material to individual with discussion of falls prevention and safety. Flowsheet Row Pulmonary Rehab from 04/13/2023 in [facility] Cardiac and Pulmonary Rehab  Date 03/21/23  Educator SB  Instruction Review Code 1- Verbalizes Understanding       Chronic Lung Disease Review: - Group verbal instruction with posters, models, PowerPoint presentations and videos,  to review new updates, new respiratory  medications, new advancements in procedures and treatments. Providing information on websites and "800" numbers for continued self-education. Includes information about supplement oxygen, available portable oxygen systems, continuous and intermittent flow rates, oxygen safety, concentrators, and Medicare reimbursement for oxygen. Explanation of Pulmonary Drugs, including class, frequency, complications, importance of spacers, rinsing mouth after steroid MDI's, and proper cleaning methods for nebulizers. Review of basic lung anatomy and physiology related to function, structure, and complications of lung disease. Review of risk factors. Discussion about methods for diagnosing sleep apnea and types of masks and machines for OSA. Includes a review of the use  of types of environmental controls: home humidity, furnaces, filters, dust mite/pet prevention, HEPA vacuums. Discussion about weather changes, air quality and the benefits of nasal washing. Instruction on Warning signs, infection symptoms, calling MD promptly, preventive modes, and value of vaccinations. Review of effective airway clearance, coughing and/or vibration techniques. Emphasizing that all should Create an Action Plan. Written material given at graduation. Flowsheet Row Pulmonary Rehab from 04/13/2023 in [facility] Cardiac and Pulmonary Rehab  Education need identified 04/04/23       AED/CPR: - Group verbal and written instruction with the use of models to demonstrate the basic use of the AED with the basic ABC's of resuscitation.    Anatomy and Cardiac Procedures: - Group verbal and visual presentation and models provide information about basic cardiac anatomy and function. Reviews the testing methods done to diagnose heart disease and the outcomes of the test results. Describes the treatment choices: Medical Management, Angioplasty, or Coronary Bypass Surgery for treating various heart conditions including Myocardial Infarction, Angina, Valve  Disease, and Cardiac Arrhythmias.  Written material given at graduation.   Medication Safety: - Group verbal and visual instruction to review commonly prescribed medications for heart and lung disease. Reviews the medication, class of the drug, and side effects. Includes the steps to properly store meds and maintain the prescription regimen.  Written material given at graduation.   Other: -Provides group and verbal instruction on various topics (see comments)   Knowledge Questionnaire Score:  Knowledge Questionnaire Score - 04/04/23 1541       Knowledge Questionnaire Score   Pre Score 16/18              Core Components/Risk Factors/Patient Goals at Admission:  Personal Goals and Risk Factors at Admission - 04/04/23 1541       Core Components/Risk Factors/Patient Goals on Admission    Weight Management Yes;Weight Loss    Intervention Weight Management: Develop a combined nutrition and exercise program designed to reach desired caloric intake, while maintaining appropriate intake of nutrient and fiber, sodium and fats, and appropriate energy expenditure required for the weight goal.;Weight Management: Provide education and appropriate resources to help participant work on and attain dietary goals.;Weight Management/Obesity: Establish reasonable short term and long term weight goals.    Admit Weight 210 lb 1.6 oz (95.3 kg)    Goal Weight: Short Term 205 lb (93 kg)    Goal Weight: Long Term 200 lb (90.7 kg)    Expected Outcomes Short Term: Continue to assess and modify interventions until short term weight is achieved;Long Term: Adherence to nutrition and physical activity/exercise program aimed toward attainment of established weight goal;Weight Loss: Understanding of general recommendations for a balanced deficit meal plan, which promotes 1-2 lb weight loss per week and includes a negative energy balance of [phone] kcal/d;Understanding recommendations for meals to include 15-35%  energy as protein, 25-35% energy from fat, 35-60% energy from carbohydrates, less than 200mg  of dietary cholesterol, 20-35 gm of total fiber daily;Understanding of distribution of calorie intake throughout the day with the consumption of 4-5 meals/snacks    Improve shortness of breath with ADL's Yes    Intervention Provide education, individualized exercise plan and daily activity instruction to help decrease symptoms of SOB with activities of daily living.    Expected Outcomes Short Term: Improve cardiorespiratory fitness to achieve a reduction of symptoms when performing ADLs;Long Term: Be able to perform more ADLs without symptoms or delay the onset of symptoms    Increase knowledge of respiratory medications and ability  to use respiratory devices properly  Yes    Intervention Provide education and demonstration as needed of appropriate use of medications, inhalers, and oxygen therapy.    Expected Outcomes Short Term: Achieves understanding of medications use. Understands that oxygen is a medication prescribed by physician. Demonstrates appropriate use of inhaler and oxygen therapy.;Long Term: Maintain appropriate use of medications, inhalers, and oxygen therapy.    Hypertension Yes    Intervention Provide education on lifestyle modifcations including regular physical activity/exercise, weight management, moderate sodium restriction and increased consumption of fresh fruit, vegetables, and low fat dairy, alcohol moderation, and smoking cessation.;Monitor prescription use compliance.    Expected Outcomes Short Term: Continued assessment and intervention until BP is < 140/99mm HG in hypertensive participants. < 130/86mm HG in hypertensive participants with diabetes, heart failure or chronic kidney disease.;Long Term: Maintenance of blood pressure at goal levels.    Lipids Yes    Intervention Provide education and support for participant on nutrition & aerobic/resistive exercise along with prescribed  medications to achieve LDL 70mg , HDL >40mg .    Expected Outcomes Short Term: Participant states understanding of desired cholesterol values and is compliant with medications prescribed. Participant is following exercise prescription and nutrition guidelines.;Long Term: Cholesterol controlled with medications as prescribed, with individualized exercise RX and with personalized nutrition plan. Value goals: LDL < 70mg , HDL > 40 mg.             Education:Diabetes - Individual verbal and written instruction to review signs/symptoms of diabetes, desired ranges of glucose level fasting, after meals and with exercise. Acknowledge that pre and post exercise glucose checks will be done for 3 sessions at entry of program.   Know Your Numbers and Heart Failure: - Group verbal and visual instruction to discuss disease risk factors for cardiac and pulmonary disease and treatment options.  Reviews associated critical values for Overweight/Obesity, Hypertension, Cholesterol, and Diabetes.  Discusses basics of heart failure: signs/symptoms and treatments.  Introduces Heart Failure Zone chart for action plan for heart failure.  Written material given at graduation.   Core Components/Risk Factors/Patient Goals Review:   Goals and Risk Factor Review     Row Name 05/04/23 1021             Core Components/Risk Factors/Patient Goals Review   Personal Goals Review Improve shortness of breath with ADL's;Hypertension       Review Daniel Carr reports feeling at his baseline. He hasn't seen any improvemenet since starting the program, but he is motivated to continue coming and is optimistic to see what his progress will be. His blood pressure has been stable so far in the program. He does not check it regularly at home. He does have a pulse ox which he uses sometimes. He has a lot of help at home with shopping and chores, but he does note that he has a hard time doing some things around the house because of his  breathing. He tries to remember to do his PLB and rest as needed. He had a knee replacement during COVID and couldn't go to rehab, so he has become more sedentary which he states that his breathing has gotten worse since then. He is trying to be more active by coming to the program.       Expected Outcomes Short: attend pulmonary rehab to work on his shortness of breath with ADLs. Long: independently manage breathing techniques                Core Components/Risk Factors/Patient Goals  at Discharge (Final Review):   Goals and Risk Factor Review - 05/04/23 1021       Core Components/Risk Factors/Patient Goals Review   Personal Goals Review Improve shortness of breath with ADL's;Hypertension    Review Daniel Carr reports feeling at his baseline. He hasn't seen any improvemenet since starting the program, but he is motivated to continue coming and is optimistic to see what his progress will be. His blood pressure has been stable so far in the program. He does not check it regularly at home. He does have a pulse ox which he uses sometimes. He has a lot of help at home with shopping and chores, but he does note that he has a hard time doing some things around the house because of his breathing. He tries to remember to do his PLB and rest as needed. He had a knee replacement during COVID and couldn't go to rehab, so he has become more sedentary which he states that his breathing has gotten worse since then. He is trying to be more active by coming to the program.    Expected Outcomes Short: attend pulmonary rehab to work on his shortness of breath with ADLs. Long: independently manage breathing techniques             ITP Comments:  ITP Comments     Row Name 03/21/23 1614 04/04/23 1533 04/06/23 1002 04/13/23 1326 05/11/23 0919   ITP Comments Virtual orientation call completed today. he has an appointment on Date: 04/04/2023  for EP eval and gym Orientation.  Documentation of diagnosis [facility] be found in [facility]  Date: [id number] . Completed and gym orientation. Initial ITP created and sent for review to Dr. Jinny Sanders, Medical Director. First full day of exercise!  Patient was oriented to gym and equipment including functions, settings, policies, and procedures.  Patient's individual exercise prescription and treatment plan were reviewed.  All starting workloads were established based on the results of the 6 minute walk test done at initial orientation visit.  The plan for exercise progression was also introduced and progression will be customized based on patient's performance and goals. 30 day review completed. ITP sent to Dr. Jinny Sanders, Medical Director of  Pulmonary Rehab. Continue with ITP unless changes are made by physician. 30 Day review completed. Medical Director ITP review done, changes made as directed, and signed approval by Medical Director.            Comments:

## 2023-05-11 NOTE — Progress Notes (Signed)
Daily Session Note  Patient Details  Name: Daniel Carr MRN: [id number] Date of Birth: [birth year] Referring Provider:   Flowsheet Row Pulmonary Rehab from 04/04/2023 in [facility] Cardiac and Pulmonary Rehab  Referring Provider Mila Merry MD       Encounter Date: 05/11/2023  Check In:  Session Check In - 05/11/23 0955       Check-In   Supervising physician immediately available to respond to emergencies See telemetry face sheet for immediately available ER MD    Location [facility]-Cardiac & Pulmonary Rehab    Staff Present Lanny Hurst, RN, ADN;Jessica Juanetta Gosling, MA, RCEP, CCRP, CCET;Noah Tickle, BS, Exercise Physiologist    Virtual Visit No    Medication changes reported     No    Fall or balance concerns reported    No    Warm-up and Cool-down Performed on first and last piece of equipment    Resistance Training Performed Yes    VAD Patient? No    PAD/SET Patient? No      Pain Assessment   Currently in Pain? No/denies                Social History   Tobacco Use  Smoking Status Former   Packs/day: 0.25   Years: 6.00   Additional pack years: 0.00   Total pack years: 1.50   Types: Cigarettes   Quit date: 11/13/1969   Years since quitting: 53.5  Smokeless Tobacco Never  Tobacco Comments   quit in 1970's    Goals Met:  Independence with exercise equipment Exercise tolerated well No report of concerns or symptoms today Strength training completed today  Goals Unmet:  Not Applicable  Comments: Pt able to follow exercise prescription today without complaint.  Will continue to monitor for progression.    Dr. Bethann Punches is Medical Director for [facility] Cardiac Rehabilitation.  Dr. Vida Rigger is Medical Director for [facility] Pulmonary Rehabilitation.

## 2023-05-13 ENCOUNTER — Encounter: Payer: Medicare HMO | Admitting: *Deleted

## 2023-05-13 DIAGNOSIS — J986 Disorders of diaphragm: Secondary | ICD-10-CM | POA: Diagnosis not present

## 2023-05-13 DIAGNOSIS — J449 Chronic obstructive pulmonary disease, unspecified: Secondary | ICD-10-CM | POA: Diagnosis not present

## 2023-05-13 NOTE — Progress Notes (Signed)
Daily Session Note  Patient Details  Name: Hridaan Traw MRN: [id number] Date of Birth: [birth year] Referring Provider:   Flowsheet Row Pulmonary Rehab from 04/04/2023 in [facility] Cardiac and Pulmonary Rehab  Referring Provider Mila Merry MD       Encounter Date: 05/13/2023  Check In:  Session Check In - 05/13/23 0950       Check-In   Supervising physician immediately available to respond to emergencies See telemetry face sheet for immediately available ER MD    Location [facility]-Cardiac & Pulmonary Rehab    Staff Present Lanny Hurst, RN, ADN;Jessica Juanetta Gosling, MA, RCEP, CCRP, CCET;Joseph [place], [place]    Virtual Visit No    Medication changes reported     No    Fall or balance concerns reported    No    Warm-up and Cool-down Performed on first and last piece of equipment    Resistance Training Performed Yes    VAD Patient? No    PAD/SET Patient? No      Pain Assessment   Currently in Pain? No/denies                Social History   Tobacco Use  Smoking Status Former   Packs/day: 0.25   Years: 6.00   Additional pack years: 0.00   Total pack years: 1.50   Types: Cigarettes   Quit date: 11/13/1969   Years since quitting: 53.5  Smokeless Tobacco Never  Tobacco Comments   quit in 1970's    Goals Met:  Independence with exercise equipment Exercise tolerated well No report of concerns or symptoms today Strength training completed today  Goals Unmet:  Not Applicable  Comments: Pt able to follow exercise prescription today without complaint.  Will continue to monitor for progression.    Dr. Bethann Punches is Medical Director for [facility] Cardiac Rehabilitation.  Dr. Vida Rigger is Medical Director for [facility] Pulmonary Rehabilitation.

## 2023-05-16 ENCOUNTER — Encounter: Payer: Medicare HMO | Admitting: *Deleted

## 2023-05-16 DIAGNOSIS — J449 Chronic obstructive pulmonary disease, unspecified: Secondary | ICD-10-CM | POA: Diagnosis not present

## 2023-05-16 DIAGNOSIS — J986 Disorders of diaphragm: Secondary | ICD-10-CM

## 2023-05-16 NOTE — Progress Notes (Signed)
Daily Session Note  Patient Details  Name: Daniel Carr MRN: [id number] Date of Birth: 1935/02/12 Referring Provider:   Flowsheet Row Pulmonary Rehab from 04/04/2023 in [facility] Cardiac and Pulmonary Rehab  Referring Provider Mila Merry MD       Encounter Date: 05/16/2023  Check In:  Session Check In - 05/16/23 1027       Check-In   Supervising physician immediately available to respond to emergencies See telemetry face sheet for immediately available ER MD    Location [facility]-Cardiac & Pulmonary Rehab    Staff Present Lanny Hurst, RN, Franki Monte, BS, ACSM CEP, Exercise Physiologist;Noah Tickle, BS, Exercise Physiologist    Virtual Visit No    Medication changes reported     No    Fall or balance concerns reported    No    Warm-up and Cool-down Performed on first and last piece of equipment    Resistance Training Performed Yes    VAD Patient? No    PAD/SET Patient? No      Pain Assessment   Currently in Pain? No/denies                Social History   Tobacco Use  Smoking Status Former   Packs/day: 0.25   Years: 6.00   Additional pack years: 0.00   Total pack years: 1.50   Types: Cigarettes   Quit date: 11/13/1969   Years since quitting: 53.5  Smokeless Tobacco Never  Tobacco Comments   quit in 1970's    Goals Met:  Independence with exercise equipment Exercise tolerated well No report of concerns or symptoms today Strength training completed today  Goals Unmet:  Not Applicable  Comments: Pt able to follow exercise prescription today without complaint.  Will continue to monitor for progression.    Dr. Bethann Punches is Medical Director for [facility] Cardiac Rehabilitation.  Dr. Vida Rigger is Medical Director for [facility] Pulmonary Rehabilitation.

## 2023-05-18 DIAGNOSIS — R0600 Dyspnea, unspecified: Secondary | ICD-10-CM | POA: Diagnosis not present

## 2023-05-19 DIAGNOSIS — Z87891 Personal history of nicotine dependence: Secondary | ICD-10-CM | POA: Diagnosis not present

## 2023-05-19 DIAGNOSIS — M25473 Effusion, unspecified ankle: Secondary | ICD-10-CM | POA: Diagnosis not present

## 2023-05-19 DIAGNOSIS — I2693 Single subsegmental pulmonary embolism without acute cor pulmonale: Secondary | ICD-10-CM | POA: Diagnosis not present

## 2023-05-19 DIAGNOSIS — J9601 Acute respiratory failure with hypoxia: Secondary | ICD-10-CM | POA: Diagnosis not present

## 2023-05-19 DIAGNOSIS — J42 Unspecified chronic bronchitis: Secondary | ICD-10-CM | POA: Diagnosis not present

## 2023-05-19 DIAGNOSIS — E785 Hyperlipidemia, unspecified: Secondary | ICD-10-CM | POA: Diagnosis not present

## 2023-05-19 DIAGNOSIS — I10 Essential (primary) hypertension: Secondary | ICD-10-CM | POA: Diagnosis not present

## 2023-05-20 ENCOUNTER — Encounter: Payer: Medicare HMO | Admitting: *Deleted

## 2023-05-20 DIAGNOSIS — J986 Disorders of diaphragm: Secondary | ICD-10-CM | POA: Diagnosis not present

## 2023-05-20 DIAGNOSIS — J449 Chronic obstructive pulmonary disease, unspecified: Secondary | ICD-10-CM | POA: Diagnosis not present

## 2023-05-20 NOTE — Progress Notes (Signed)
Daily Session Note  Patient Details  Name: Daniel Carr MRN: [id number] Date of Birth: 1935/10/24 Referring Provider:   Flowsheet Row Pulmonary Rehab from 04/04/2023 in [facility] Cardiac and Pulmonary Rehab  Referring Provider Mila Merry MD       Encounter Date: 05/20/2023  Check In:  Session Check In - 05/20/23 1039       Check-In   Supervising physician immediately available to respond to emergencies See telemetry face sheet for immediately available ER MD    Location [facility]-Cardiac & Pulmonary Rehab    Staff Present Cora Collum, RN, BSN, CCRP;Jessica [place], MA, RCEP, CCRP, CCET;Joseph [place], [place]    Virtual Visit No    Medication changes reported     No    Fall or balance concerns reported    No    Warm-up and Cool-down Performed on first and last piece of equipment    Resistance Training Performed Yes    VAD Patient? No    PAD/SET Patient? No      Pain Assessment   Currently in Pain? No/denies                Social History   Tobacco Use  Smoking Status Former   Packs/day: 0.25   Years: 6.00   Additional pack years: 0.00   Total pack years: 1.50   Types: Cigarettes   Quit date: 11/13/1969   Years since quitting: 53.5  Smokeless Tobacco Never  Tobacco Comments   quit in 1970's    Goals Met:  Proper associated with RPD/PD & O2 Sat Independence with exercise equipment Exercise tolerated well No report of concerns or symptoms today  Goals Unmet:  Not Applicable  Comments: Pt able to follow exercise prescription today without complaint.  Will continue to monitor for progression.    Dr. Bethann Punches is Medical Director for [facility] Cardiac Rehabilitation.  Dr. Vida Rigger is Medical Director for [facility] Pulmonary Rehabilitation.

## 2023-05-25 ENCOUNTER — Encounter: Payer: Medicare HMO | Admitting: *Deleted

## 2023-05-25 DIAGNOSIS — J986 Disorders of diaphragm: Secondary | ICD-10-CM

## 2023-05-25 DIAGNOSIS — J449 Chronic obstructive pulmonary disease, unspecified: Secondary | ICD-10-CM | POA: Diagnosis not present

## 2023-05-25 NOTE — Progress Notes (Signed)
Daily Session Note  Patient Details  Name: Daniel Carr MRN: [id number] Date of Birth: 09-Dec-1935 Referring Provider:   Flowsheet Row Pulmonary Rehab from 04/04/2023 in [facility] Cardiac and Pulmonary Rehab  Referring Provider Mila Merry MD       Encounter Date: 05/25/2023  Check In:  Session Check In - 05/25/23 1033       Check-In   Supervising physician immediately available to respond to emergencies See telemetry face sheet for immediately available ER MD    Location [facility]-Cardiac & Pulmonary Rehab    Staff Present Lanny Hurst, RN, ADN;Meredith Jewel Baize, RN BSN;Joseph [place], RCP,RRT,BSRT;Noah Tickle, BS, Exercise Physiologist    Virtual Visit No    Medication changes reported     No    Fall or balance concerns reported    No    Warm-up and Cool-down Performed on first and last piece of equipment    Resistance Training Performed Yes    VAD Patient? No    PAD/SET Patient? No      Pain Assessment   Currently in Pain? No/denies                Social History   Tobacco Use  Smoking Status Former   Packs/day: 0.25   Years: 6.00   Additional pack years: 0.00   Total pack years: 1.50   Types: Cigarettes   Quit date: 11/13/1969   Years since quitting: 53.5  Smokeless Tobacco Never  Tobacco Comments   quit in 1970's    Goals Met:  Independence with exercise equipment Exercise tolerated well No report of concerns or symptoms today Strength training completed today  Goals Unmet:  Not Applicable  Comments: Pt able to follow exercise prescription today without complaint.  Will continue to monitor for progression.    Dr. Bethann Punches is Medical Director for [facility] Cardiac Rehabilitation.  Dr. Vida Rigger is Medical Director for [facility] Pulmonary Rehabilitation.

## 2023-05-27 ENCOUNTER — Encounter: Payer: Medicare HMO | Admitting: *Deleted

## 2023-05-27 DIAGNOSIS — J986 Disorders of diaphragm: Secondary | ICD-10-CM | POA: Diagnosis not present

## 2023-05-27 DIAGNOSIS — J449 Chronic obstructive pulmonary disease, unspecified: Secondary | ICD-10-CM | POA: Diagnosis not present

## 2023-05-27 NOTE — Progress Notes (Signed)
Daily Session Note  Patient Details  Name: Jaree Rempe MRN: [id number] Date of Birth: 1935-11-22 Referring Provider:   Flowsheet Row Pulmonary Rehab from 04/04/2023 in [facility] Cardiac and Pulmonary Rehab  Referring Provider Mila Merry MD       Encounter Date: 05/27/2023  Check In:  Session Check In - 05/27/23 1044       Check-In   Supervising physician immediately available to respond to emergencies See telemetry face sheet for immediately available ER MD    Location [facility]-Cardiac & Pulmonary Rehab    Staff Present Cora Collum, RN, BSN, CCRP;Jessica [place], MA, RCEP, CCRP, CCET;Joseph [place], [place]    Virtual Visit No    Medication changes reported     No    Fall or balance concerns reported    No    Warm-up and Cool-down Performed on first and last piece of equipment    Resistance Training Performed Yes    VAD Patient? No    PAD/SET Patient? No      Pain Assessment   Currently in Pain? No/denies                Social History   Tobacco Use  Smoking Status Former   Packs/day: 0.25   Years: 6.00   Additional pack years: 0.00   Total pack years: 1.50   Types: Cigarettes   Quit date: 11/13/1969   Years since quitting: 53.5  Smokeless Tobacco Never  Tobacco Comments   quit in 1970's    Goals Met:  Proper associated with RPD/PD & O2 Sat Independence with exercise equipment Exercise tolerated well No report of concerns or symptoms today  Goals Unmet:  Not Applicable  Comments: Pt able to follow exercise prescription today without complaint.  Will continue to monitor for progression.    Dr. Bethann Punches is Medical Director for [facility] Cardiac Rehabilitation.  Dr. Vida Rigger is Medical Director for [facility] Pulmonary Rehabilitation.

## 2023-06-06 ENCOUNTER — Encounter: Payer: Medicare HMO | Attending: Family Medicine | Admitting: *Deleted

## 2023-06-06 DIAGNOSIS — J449 Chronic obstructive pulmonary disease, unspecified: Secondary | ICD-10-CM | POA: Insufficient documentation

## 2023-06-06 DIAGNOSIS — J986 Disorders of diaphragm: Secondary | ICD-10-CM | POA: Insufficient documentation

## 2023-06-06 NOTE — Progress Notes (Signed)
Daily Session Note  Patient Details  Name: Dresden Lozito MRN: [id number] Date of Birth: January 20, 1935 Referring Provider:   Flowsheet Row Pulmonary Rehab from 04/04/2023 in [facility] Cardiac and Pulmonary Rehab  Referring Provider Mila Merry MD       Encounter Date: 06/06/2023  Check In:  Session Check In - 06/06/23 1029       Check-In   Supervising physician immediately available to respond to emergencies See telemetry face sheet for immediately available ER MD    Location [facility]-Cardiac & Pulmonary Rehab    Staff Present Cyndia Diver, RN, BSN, Ronelle Nigh, BS, Exercise Physiologist;Other    Virtual Visit No    Medication changes reported     No    Fall or balance concerns reported    No    Warm-up and Cool-down Performed on first and last piece of equipment    Resistance Training Performed Yes    VAD Patient? No    PAD/SET Patient? No      Pain Assessment   Currently in Pain? No/denies                Social History   Tobacco Use  Smoking Status Former   Packs/day: 0.25   Years: 6.00   Additional pack years: 0.00   Total pack years: 1.50   Types: Cigarettes   Quit date: 11/13/1969   Years since quitting: 53.5  Smokeless Tobacco Never  Tobacco Comments   quit in 1970's    Goals Met:  Independence with exercise equipment Exercise tolerated well No report of concerns or symptoms today  Goals Unmet:  Not Applicable  Comments: Pt able to follow exercise prescription today without complaint.  Will continue to monitor for progression.    Dr. Bethann Punches is Medical Director for [facility] Cardiac Rehabilitation.  Dr. Vida Rigger is Medical Director for [facility] Pulmonary Rehabilitation.

## 2023-06-08 ENCOUNTER — Encounter: Payer: Medicare HMO | Admitting: *Deleted

## 2023-06-08 ENCOUNTER — Encounter: Payer: Self-pay | Admitting: *Deleted

## 2023-06-08 DIAGNOSIS — J449 Chronic obstructive pulmonary disease, unspecified: Secondary | ICD-10-CM | POA: Diagnosis not present

## 2023-06-08 DIAGNOSIS — J986 Disorders of diaphragm: Secondary | ICD-10-CM | POA: Diagnosis not present

## 2023-06-08 NOTE — Progress Notes (Signed)
Daily Session Note  Patient Details  Name: Daniel Carr MRN: [id number] Date of Birth: [birth year] Referring Provider:   Flowsheet Row Pulmonary Rehab from 04/04/2023 in [facility] Cardiac and Pulmonary Rehab  Referring Provider Mila Merry MD       Encounter Date: 06/08/2023  Check In:  Session Check In - 06/08/23 0957       Check-In   Supervising physician immediately available to respond to emergencies See telemetry face sheet for immediately available ER MD    Location [facility]-Cardiac & Pulmonary Rehab    Staff Present Susann Givens, RN BSN;Joseph [place], RCP,RRT,BSRT;Jessica [place], [place], RCEP, CCRP, [place], [place], Exercise Physiologist    Virtual Visit No    Medication changes reported     No    Fall or balance concerns reported    No    Warm-up and Cool-down Performed on first and last piece of equipment    Resistance Training Performed Yes    VAD Patient? No    PAD/SET Patient? No      Pain Assessment   Currently in Pain? No/denies                Social History   Tobacco Use  Smoking Status Former   Packs/day: 0.25   Years: 6.00   Additional pack years: 0.00   Total pack years: 1.50   Types: Cigarettes   Quit date: 11/13/1969   Years since quitting: 53.6  Smokeless Tobacco Never  Tobacco Comments   quit in 1970's    Goals Met:  Independence with exercise equipment Exercise tolerated well No report of concerns or symptoms today Strength training completed today  Goals Unmet:  Not Applicable  Comments: Pt able to follow exercise prescription today without complaint.  Will continue to monitor for progression.    Dr. Bethann Punches is Medical Director for [facility] Cardiac Rehabilitation.  Dr. Vida Rigger is Medical Director for [facility] Pulmonary Rehabilitation.

## 2023-06-08 NOTE — Progress Notes (Signed)
Pulmonary Individual Treatment Plan  Patient Details  Name: Daniel Carr MRN: [id number] Date of Birth: [birth year] Referring Provider:   Flowsheet Row Pulmonary Rehab from 04/04/2023 in [facility] Cardiac and Pulmonary Rehab  Referring Provider Mila Merry MD       Initial Encounter Date:  Flowsheet Row Pulmonary Rehab from 04/04/2023 in [facility] Cardiac and Pulmonary Rehab  Date 04/04/23       Visit Diagnosis: Stage 3 severe COPD by GOLD classification (HCC)  Patient's Home Medications on Admission:  Current Outpatient Medications:    amLODipine (NORVASC) 2.5 MG tablet, Take 1 tablet by mouth once daily, Disp: 90 tablet, Rfl: 4   aspirin 81 MG EC tablet, Take 1 tablet (81 mg total) by mouth daily. Swallow whole., Disp: , Rfl:    azelastine (ASTELIN) 0.1 % nasal spray, Place 1 spray into the nose daily as needed (allergies)., Disp: , Rfl:    brimonidine (ALPHAGAN) 0.2 % ophthalmic solution, Place 1 drop into both eyes in the morning and at bedtime., Disp: , Rfl:    clotrimazole-betamethasone (LOTRISONE) cream, APPLY TOPICALLY ONCE DAILY (Patient not taking: Reported on 03/21/2023), Disp: 15 g, Rfl: 0   COD LIVER OIL PO, Take 1 capsule by mouth daily. (Patient not taking: Reported on 03/21/2023), Disp: , Rfl:    dorzolamide (TRUSOPT) 2 % ophthalmic solution, Apply to eye., Disp: , Rfl:    dutasteride (AVODART) 0.5 MG capsule, Take 1 capsule by mouth once daily, Disp: 90 capsule, Rfl: 4   enalapril-hydrochlorothiazide (VASERETIC) 10-25 MG tablet, hydroCHLOROthiazide 25mg  (Patient not taking: Reported on 03/21/2023), Disp: , Rfl:    hydrochlorothiazide (HYDRODIURIL) 25 MG tablet, TAKE ONE-HALF TABLET BY MOUTH ONCE DAILY, Disp: 45 tablet, Rfl: 03   ipratropium (ATROVENT) 0.06 % nasal spray, Place 1 spray into both nostrils 2 (two) times daily as needed for rhinitis., Disp: , Rfl: 6   latanoprost (XALATAN) 0.005 % ophthalmic solution, SMARTSIG:In Eye(s), Disp: , Rfl:    levocetirizine (XYZAL) 5 MG  tablet, Take 5 mg by mouth every evening., Disp: , Rfl:    levothyroxine (SYNTHROID) 150 MCG tablet, Take 1 tablet by mouth once daily, Disp: 90 tablet, Rfl: 4   lovastatin (MEVACOR) 40 MG tablet, Take 1 tablet by mouth once daily, Disp: 90 tablet, Rfl: 3   metoprolol tartrate (LOPRESSOR) 100 MG tablet, Take tablet TWO hours prior to his cardiac CT scan., Disp: 1 tablet, Rfl: 0   Multiple Vitamin (DAILY VITAMIN PO), Take 1 tablet by mouth daily., Disp: , Rfl:    naproxen (NAPROSYN) 500 MG tablet, Take 1 tablet (500 mg total) by mouth 2 (two) times daily with a meal. (Patient not taking: Reported on 03/21/2023), Disp: 30 tablet, Rfl: 1   predniSONE (DELTASONE) 5 MG tablet, Take by mouth. (Patient not taking: Reported on 04/29/2023), Disp: , Rfl:    traMADol (ULTRAM) 50 MG tablet, Take 50 mg by mouth every 6 (six) hours as needed., Disp: , Rfl:    vitamin B-12 (CYANOCOBALAMIN) 100 MCG tablet, Take 100 mcg by mouth daily. Unsure of dose  is OTC med, Disp: , Rfl:   Past Medical History: Past Medical History:  Diagnosis Date   Arthritis    childhood infection    chicken pox, measels, and mumps   Glaucoma    Hypertension    Hypothyroidism    Sinus problem    Ulnar neuropathy    bilateral    Tobacco Use: Social History   Tobacco Use  Smoking Status Former   Packs/day: 0.25  Years: 6.00   Additional pack years: 0.00   Total pack years: 1.50   Types: Cigarettes   Quit date: 11/13/1969   Years since quitting: 53.6  Smokeless Tobacco Never  Tobacco Comments   quit in 1970's    Labs: Review Flowsheet  More data exists      Latest Ref Rng & Units 06/11/2019 07/22/2020 07/06/2021 03/29/2022 10/26/2022  Labs for ITP Cardiac and Pulmonary Rehab  Cholestrol 100 - 199 mg/dL 161  096  045  409  811   LDL (calc) 0 - 99 mg/dL 76  64  74  66  71   HDL-C >39 mg/dL 65  68  76  81  90   Trlycerides 0 - 149 mg/dL 76  89  61  60  57      Pulmonary Assessment Scores:  Pulmonary Assessment Scores      Row Name 04/04/23 1543         ADL UCSD   ADL Phase Entry     SOB Score total 72     Rest 3     Walk 4     Stairs 4     Bath 2     Dress 2     Shop 3       CAT Score   CAT Score 17       mMRC Score   mMRC Score 4              UCSD: Self-administered rating of dyspnea associated with activities of daily living (ADLs) 6-point scale (0 = "not at all" to 5 = "maximal or unable to do because of breathlessness")  Scoring Scores range from 0 to 120.  Minimally important difference is 5 units  CAT: CAT can identify the health impairment of COPD patients and is better correlated with disease progression.  CAT has a scoring range of zero to 40. The CAT score is classified into four groups of low (less than 10), medium (10 - 20), high (21-30) and very high (31-40) based on the impact level of disease on health status. A CAT score over 10 suggests significant symptoms.  A worsening CAT score could be explained by an exacerbation, poor medication adherence, poor inhaler technique, or progression of COPD or comorbid conditions.  CAT MCID is 2 points  mMRC: mMRC (Modified Medical Research Council) Dyspnea Scale is used to assess the degree of baseline functional disability in patients of respiratory disease due to dyspnea. No minimal important difference is established. A decrease in score of 1 point or greater is considered a positive change.   Pulmonary Function Assessment:  Pulmonary Function Assessment - 04/04/23 1543       Pulmonary Function Tests   FVC% 2.28 %   test from 02/04/21   FEV1% 1.17 %    FEV1/FVC Ratio 51.52      Breath   Shortness of Breath Yes;Limiting activity             Exercise Target Goals: Exercise Program Goal: Individual exercise prescription set using results from initial 6 min walk test and THRR while considering  patient's activity barriers and safety.   Exercise Prescription Goal: Initial exercise prescription builds to 30-45 minutes a  day of aerobic activity, 2-3 days per week.  Home exercise guidelines will be given to patient during program as part of exercise prescription that the participant will acknowledge.  Education: Aerobic Exercise: - Group verbal and visual presentation on the components of exercise prescription.  Introduces F.I.T.T principle from ACSM for exercise prescriptions.  Reviews F.I.T.T. principles of aerobic exercise including progression. Written material given at graduation.   Education: Resistance Exercise: - Group verbal and visual presentation on the components of exercise prescription. Introduces F.I.T.T principle from ACSM for exercise prescriptions  Reviews F.I.T.T. principles of resistance exercise including progression. Written material given at graduation.    Education: Exercise & Equipment Safety: - Individual verbal instruction and demonstration of equipment use and safety with use of the equipment. Flowsheet Row Pulmonary Rehab from 05/11/2023 in [facility] Cardiac and Pulmonary Rehab  Date 04/04/23  Educator [facility]  Instruction Review Code 1- Verbalizes Understanding       Education: Exercise Physiology & General Exercise Guidelines: - Group verbal and written instruction with models to review the exercise physiology of the cardiovascular system and associated critical values. Provides general exercise guidelines with specific guidelines to those with heart or lung disease.    Education: Flexibility, Balance, Mind/Body Relaxation: - Group verbal and visual presentation with interactive activity on the components of exercise prescription. Introduces F.I.T.T principle from ACSM for exercise prescriptions. Reviews F.I.T.T. principles of flexibility and balance exercise training including progression. Also discusses the mind body connection.  Reviews various relaxation techniques to help reduce and manage stress (i.e. Deep breathing, progressive muscle relaxation, and visualization). Balance handout  provided to take home. Written material given at graduation.   Activity Barriers & Risk Stratification:  Activity Barriers & Cardiac Risk Stratification - 04/04/23 1535       Activity Barriers & Cardiac Risk Stratification   Activity Barriers Joint Problems;Right Knee Replacement;Left Knee Replacement;Arthritis;Muscular Weakness;Deconditioning;Balance Concerns;Other (comment);Shortness of Breath;History of Falls;Assistive Device    Comments Arthritis all over causing deformities in hands, neuropathy in feet             6 Minute Walk:  6 Minute Walk     Row Name 04/04/23 1533         6 Minute Walk   Phase Initial     Distance 310 feet     Walk Time 5 minutes     # of Rest Breaks 1  1 minute rest     MPH 0.7     METS 0.51     RPE 13     Perceived Dyspnea  3     VO2 Peak 1.77     Symptoms Yes (comment)     Comments fatigue, SOB, chronic foot pain     Resting HR 75 bpm     Resting BP 134/66     Resting Oxygen Saturation  94 %     Exercise Oxygen Saturation  during 6 min walk 87 %     Max Ex. HR 102 bpm     Max Ex. BP 154/72     2 Minute Post BP 138/70       Interval HR   1 Minute HR 94     2 Minute HR 98     3 Minute HR 99     4 Minute HR 96     5 Minute HR 101     6 Minute HR 102     2 Minute Post HR 79     Interval Heart Rate? Yes       Interval Oxygen   Interval Oxygen? Yes     Baseline Oxygen Saturation % 94 %     1 Minute Oxygen Saturation % 89 %     1 Minute Liters of Oxygen 0 L  Room Air     2 Minute Oxygen Saturation % 87 %     2 Minute Liters of Oxygen 0 L     3 Minute Oxygen Saturation % 84 %     3 Minute Liters of Oxygen 0 L     4 Minute Oxygen Saturation % 88 %     4 Minute Liters of Oxygen 0 L     5 Minute Oxygen Saturation % 87 %     5 Minute Liters of Oxygen 0 L     6 Minute Oxygen Saturation % 89 %     6 Minute Liters of Oxygen 0 L     2 Minute Post Oxygen Saturation % 98 %     2 Minute Post Liters of Oxygen 0 L              Oxygen Initial Assessment:  Oxygen Initial Assessment - 04/04/23 1542       Home Oxygen   Home Oxygen Device None    Sleep Oxygen Prescription None    Home Exercise Oxygen Prescription None    Home Resting Oxygen Prescription None    Compliance with Home Oxygen Use Yes      Initial 6 min Walk   Oxygen Used None      Program Oxygen Prescription   Program Oxygen Prescription None      Intervention   Short Term Goals To learn and demonstrate proper pursed lip breathing techniques or other breathing techniques. ;To learn and understand importance of maintaining oxygen saturations>88%;To learn and demonstrate proper use of respiratory medications;To learn and understand importance of monitoring SPO2 with pulse oximeter and demonstrate accurate use of the pulse oximeter.    Long  Term Goals Exhibits proper breathing techniques, such as pursed lip breathing or other method taught during program session;Compliance with respiratory medication;Demonstrates proper use of MDI's;Maintenance of O2 saturations>88%;Verbalizes importance of monitoring SPO2 with pulse oximeter and return demonstration             Oxygen Re-Evaluation:  Oxygen Re-Evaluation     Row Name 04/06/23 1004 05/04/23 1055 05/27/23 1027         Program Oxygen Prescription   Program Oxygen Prescription -- None None       Home Oxygen   Home Oxygen Device -- None None     Sleep Oxygen Prescription -- None None     Home Exercise Oxygen Prescription -- None None     Home Resting Oxygen Prescription -- None None     Compliance with Home Oxygen Use -- Yes Yes       Goals/Expected Outcomes   Short Term Goals -- To learn and demonstrate proper pursed lip breathing techniques or other breathing techniques. ;To learn and understand importance of maintaining oxygen saturations>88%;To learn and demonstrate proper use of respiratory medications;To learn and understand importance of monitoring SPO2 with pulse oximeter and  demonstrate accurate use of the pulse oximeter. To learn and understand importance of maintaining oxygen saturations>88%;To learn and understand importance of monitoring SPO2 with pulse oximeter and demonstrate accurate use of the pulse oximeter.     Long  Term Goals -- Exhibits proper breathing techniques, such as pursed lip breathing or other method taught during program session;Compliance with respiratory medication;Demonstrates proper use of MDI's;Maintenance of O2 saturations>88%;Verbalizes importance of monitoring SPO2 with pulse oximeter and return demonstration Verbalizes importance of monitoring SPO2 with pulse oximeter and return demonstration;Maintenance of O2 saturations>88%     Comments Reviewed PLB technique  with pt.  Talked about how it works and it's importance in maintaining their exercise saturations. Granger is trying to remember to use his PLB when needed. He takes breaks and sits as needed. He hasn't noticed a difference in his breathing since starting the program yet, but is optimistic. He uses his pulse ox sometimes. He is on the list for a sleep study but doesn't know when it will be since they are a few months behind on the schedule Has a pulse oximeter to check her/his oxygen saturation at home. Informed him where to get one and explained why it is important to have one. Reviewed that oxygen saturations should be 88 percent and above.     Goals/Expected Outcomes Short: Become more profiecient at using PLB. Long: Become independent at using PLB. Short: get his sleep study scheduled and practice his PLB. Long: independently manage his breathing techniques Short: monitor oxygen at home with exertion. Long: maintain oxygen saturations above 88 percent independently.              Oxygen Discharge (Final Oxygen Re-Evaluation):  Oxygen Re-Evaluation - 05/27/23 1027       Program Oxygen Prescription   Program Oxygen Prescription None      Home Oxygen   Home Oxygen Device None     Sleep Oxygen Prescription None    Home Exercise Oxygen Prescription None    Home Resting Oxygen Prescription None    Compliance with Home Oxygen Use Yes      Goals/Expected Outcomes   Short Term Goals To learn and understand importance of maintaining oxygen saturations>88%;To learn and understand importance of monitoring SPO2 with pulse oximeter and demonstrate accurate use of the pulse oximeter.    Long  Term Goals Verbalizes importance of monitoring SPO2 with pulse oximeter and return demonstration;Maintenance of O2 saturations>88%    Comments Has a pulse oximeter to check her/his oxygen saturation at home. Informed him where to get one and explained why it is important to have one. Reviewed that oxygen saturations should be 88 percent and above.    Goals/Expected Outcomes Short: monitor oxygen at home with exertion. Long: maintain oxygen saturations above 88 percent independently.             Initial Exercise Prescription:  Initial Exercise Prescription - 04/04/23 1500       Date of Initial Exercise RX and Referring Provider   Date 04/04/23    Referring Provider Mila Merry MD      Oxygen   Maintain Oxygen Saturation 88% or higher      Recumbant Bike   Level 1    RPM 50    Watts 5    Minutes 15    METs 2      NuStep   Level 1    SPM 80    Minutes 15    METs 2      T5 Nustep   Level 1    SPM 80    Minutes 1    METs 2      Biostep-RELP   Level 1    SPM 50    Minutes 15    METs 2      Prescription Details   Frequency (times per week) 2    Duration Progress to 30 minutes of continuous aerobic without signs/symptoms of physical distress      Intensity   THRR 40-80% of Max Heartrate 99-122    Ratings of Perceived Exertion 11-13    Perceived Dyspnea 0-4  Progression   Progression Continue to progress workloads to maintain intensity without signs/symptoms of physical distress.      Resistance Training   Training Prescription Yes    Weight 3 lb     Reps 10-15             Perform Capillary Blood Glucose checks as needed.  Exercise Prescription Changes:   Exercise Prescription Changes     Row Name 04/04/23 1500 04/11/23 1300 04/25/23 1400 05/04/23 1000 05/09/23 1400     Response to Exercise   Blood Pressure (Admit) 134/66 128/64 114/60 -- --   Blood Pressure (Exercise) 154/72 136/70 146/60 -- --   Blood Pressure (Exit) 138/70 122/60 128/62 -- --   Heart Rate (Admit) 75 bpm 90 bpm 78 bpm -- 70 bpm   Heart Rate (Exercise) 102 bpm 100 bpm 102 bpm -- 111 bpm   Heart Rate (Exit) 80 bpm 100 bpm 89 bpm -- 82 bpm   Oxygen Saturation (Admit) 94 % 90 % 95 % -- 92 %   Oxygen Saturation (Exercise) 87 % 90 % 89 % -- 88 %   Oxygen Saturation (Exit) 95 % 95 % 97 % -- 96 %   Rating of Perceived Exertion (Exercise) 13 13 15  -- 13   Perceived Dyspnea (Exercise) 3 3 3  -- 3   Symptoms SOB, chronic foot pain SOB SOB -- SOB   Comments walk test results First full day of exercise -- -- --   Duration -- Progress to 30 minutes of  aerobic without signs/symptoms of physical distress Progress to 30 minutes of  aerobic without signs/symptoms of physical distress -- Progress to 30 minutes of  aerobic without signs/symptoms of physical distress   Intensity -- THRR unchanged THRR unchanged -- THRR unchanged     Progression   Progression -- Continue to progress workloads to maintain intensity without signs/symptoms of physical distress. Continue to progress workloads to maintain intensity without signs/symptoms of physical distress. -- Continue to progress workloads to maintain intensity without signs/symptoms of physical distress.   Average METs -- 2 2.53 -- 2.5     Resistance Training   Training Prescription -- Yes Yes -- Yes   Weight -- 3 lb 3 lb -- 3 lb   Reps -- 10-15 10-15 -- 10-15     Interval Training   Interval Training -- No No -- No     Treadmill   MPH -- -- 0.5 -- --   Grade -- -- 0 -- --   Minutes -- -- 15 -- --   METs -- -- 1.38 --  --     Recumbant Bike   Level -- 1 1 -- 2   Watts -- 25 25 -- 18   Minutes -- 15 15 -- 15   METs -- -- 2.82 -- --     NuStep   Level -- -- 3 -- 3   Minutes -- -- 15 -- 15   METs -- -- 3.5 -- 2.7     REL-XR   Level -- -- 1 -- --   Minutes -- -- 15 -- --     T5 Nustep   Level -- 1 1  level 2 for 5 minutes -- 1   Minutes -- 15 15 -- 15   METs -- 2 -- -- 2.1     Biostep-RELP   Level -- -- 1 -- 1   Minutes -- -- 15 -- 15   METs -- -- 3 -- 3  Home Exercise Plan   Plans to continue exercise at -- -- -- Home (comment)  [employer] Machine Home (comment)  [employer] Machine   Frequency -- -- -- Add 2 additional days to program exercise sessions. Add 2 additional days to program exercise sessions.   Initial Home Exercises Provided -- -- -- 05/04/23 05/04/23     Oxygen   Maintain Oxygen Saturation -- 88% or higher 88% or higher -- 88% or higher    Row Name 05/24/23 0800 06/06/23 1600           Response to Exercise   Blood Pressure (Admit) 124/70 122/82      Blood Pressure (Exit) 102/60 124/80      Heart Rate (Admit) 69 bpm 90 bpm      Heart Rate (Exercise) 99 bpm 96 bpm      Heart Rate (Exit) 90 bpm 88 bpm      Oxygen Saturation (Admit) 91 % 95 %      Oxygen Saturation (Exercise) 91 % 88 %      Oxygen Saturation (Exit) 98 % 92 %      Rating of Perceived Exertion (Exercise) 12 13      Perceived Dyspnea (Exercise) 2 3      Symptoms SOB SOB      Duration Continue with 30 min of aerobic exercise without signs/symptoms of physical distress. Continue with 30 min of aerobic exercise without signs/symptoms of physical distress.      Intensity THRR unchanged THRR unchanged        Progression   Progression Continue to progress workloads to maintain intensity without signs/symptoms of physical distress. Continue to progress workloads to maintain intensity without signs/symptoms of physical distress.      Average METs 2.67 2.76        Resistance Training   Training Prescription Yes  Yes      Weight 3 lb 3 lb      Reps 10-15 10-15        Interval Training   Interval Training No No        Recumbant Bike   Level 1 2      Watts 13 16      Minutes 15 15      METs 2.6 2.53        NuStep   Level 3 3      Minutes 15 30      METs 2.2 3.7        REL-XR   Level 3 --      Minutes 15 --      METs 2.5 --        T5 Nustep   Level -- 1      Minutes -- 15        Biostep-RELP   Level 1 2      Minutes 15 15      METs 3 2        Home Exercise Plan   Plans to continue exercise at Home (comment)  Elcipse Machine Home (comment)  Elcipse Machine      Frequency Add 2 additional days to program exercise sessions. Add 2 additional days to program exercise sessions.      Initial Home Exercises Provided 05/04/23 05/04/23        Oxygen   Maintain Oxygen Saturation 88% or higher 88% or higher               Exercise Comments:   Exercise Comments  Row Name 04/06/23 1003           Exercise Comments First full day of exercise!  Patient was oriented to gym and equipment including functions, settings, policies, and procedures.  Patient's individual exercise prescription and treatment plan were reviewed.  All starting workloads were established based on the results of the 6 minute walk test done at initial orientation visit.  The plan for exercise progression was also introduced and progression will be customized based on patient's performance and goals.                Exercise Goals and Review:   Exercise Goals     Row Name 04/04/23 1538             Exercise Goals   Increase Physical Activity Yes       Intervention Provide advice, education, support and counseling about physical activity/exercise needs.;Develop an individualized exercise prescription for aerobic and resistive training based on initial evaluation findings, risk stratification, comorbidities and participant's personal goals.       Expected Outcomes Long Term: Add in home exercise to make  exercise part of routine and to increase amount of physical activity.;Long Term: Exercising regularly at least 3-5 days a week.;Short Term: Attend rehab on a regular basis to increase amount of physical activity.       Increase Strength and Stamina Yes       Intervention Provide advice, education, support and counseling about physical activity/exercise needs.;Develop an individualized exercise prescription for aerobic and resistive training based on initial evaluation findings, risk stratification, comorbidities and participant's personal goals.       Expected Outcomes Short Term: Perform resistance training exercises routinely during rehab and add in resistance training at home;Short Term: Increase workloads from initial exercise prescription for resistance, speed, and METs.;Long Term: Improve cardiorespiratory fitness, muscular endurance and strength as measured by increased METs and functional capacity ( )       Able to understand and use rate of perceived exertion (RPE) scale Yes       Intervention Provide education and explanation on how to use RPE scale       Expected Outcomes Short Term: Able to use RPE daily in rehab to express subjective intensity level;Long Term:  Able to use RPE to guide intensity level when exercising independently       Able to understand and use Dyspnea scale Yes       Intervention Provide education and explanation on how to use Dyspnea scale       Expected Outcomes Long Term: Able to use Dyspnea scale to guide intensity level when exercising independently;Short Term: Able to use Dyspnea scale daily in rehab to express subjective sense of shortness of breath during exertion       Knowledge and understanding of Target Heart Rate Range (THRR) Yes       Intervention Provide education and explanation of THRR including how the numbers were predicted and where they are located for reference       Expected Outcomes Short Term: Able to state/look up THRR;Long Term: Able to use  THRR to govern intensity when exercising independently;Short Term: Able to use daily as guideline for intensity in rehab       Able to check pulse independently Yes       Intervention Provide education and demonstration on how to check pulse in carotid and radial arteries.;Review the importance of being able to check your own pulse for safety during independent exercise  Expected Outcomes Short Term: Able to explain why pulse checking is important during independent exercise;Long Term: Able to check pulse independently and accurately       Understanding of Exercise Prescription Yes       Intervention Provide education, explanation, and written materials on patient's individual exercise prescription       Expected Outcomes Short Term: Able to explain program exercise prescription;Long Term: Able to explain home exercise prescription to exercise independently                Exercise Goals Re-Evaluation :  Exercise Goals Re-Evaluation     Row Name 04/06/23 1003 04/11/23 1352 04/25/23 1454 05/04/23 1029 05/09/23 1409     Exercise Goal Re-Evaluation   Exercise Goals Review Able to understand and use rate of perceived exertion (RPE) scale;Able to understand and use Dyspnea scale;Knowledge and understanding of Target Heart Rate Range (THRR);Understanding of Exercise Prescription Increase Physical Activity;Increase Strength and Stamina;Understanding of Exercise Prescription Increase Physical Activity;Increase Strength and Stamina;Understanding of Exercise Prescription Increase Physical Activity;Increase Strength and Stamina;Understanding of Exercise Prescription;Able to understand and use rate of perceived exertion (RPE) scale;Knowledge and understanding of Target Heart Rate Range (THRR);Able to understand and use Dyspnea scale;Able to check pulse independently Increase Physical Activity;Increase Strength and Stamina;Understanding of Exercise Prescription   Comments Reviewed RPE scale, THR and  program prescription with pt today.  Pt voiced understanding and was given a copy of goals to take home. Rogelio is off to a good start in rehab. During his first day of exercise he was able to work at level 1 on both the T5 nustep and the recumbent bike. He also tolerated using 3 lb hand weights for resistance training. We will continue to monitor his progress in the program. Rogan continues to do well in rehab. He tried out level 2 on the T5 Nustep for about 5 minutes. He also increased to level 3 on the T4 Nustep. Donavin tried out the treadmill and was able to walk at a 0.5 mph speed. He had appropriate RPEs throughout his sessions. We will continue to monitoras he progresses. Chibuikem is doing well in rehab.  He is already doing some exercise at home on his own.  Reviewed home exercise with pt today.  Pt plans to use Eclipse machine at home for exercise.  Reviewed THR, pulse, RPE, sign and symptoms, pulse oximetery and when to call 911 or MD.  Also discussed weather considerations and indoor options.  Pt voiced understanding. Ishaq continues to do well in rehab. He continues to work at an overall average MET level above 2.5 METs. He also continues to work at level 1 on the T5 nustep and biostep, and level 2 on the recumbent bike and T4 nustep. We will continue to monitor his progress in the program.   Expected Outcomes Short: Use RPE daily to regulate intensity. Long: Follow program prescription in THR. Short: Continue to follow current exercise prescription. Long: Continue to improve strength and stamina. Short: Slowly increasing treadmill speed Long: Continue to increase overall MET level and stamina Short: Start to add in exercise at home routinely Long: Continue to exercise independently Short: Begin to progressively increase workloads. Long: Continue to improve strength and stamina.    Row Name 05/24/23 0755 06/06/23 1640           Exercise Goal Re-Evaluation   Exercise Goals Review Increase Physical  Activity;Increase Strength and Stamina;Understanding of Exercise Prescription Increase Physical Activity;Increase Strength and Stamina;Understanding of Exercise Prescription  Comments Jameek is doing well in rehab.  He is up to level 3 on NuStep.  We will encourage him to increase other machines consistently and continue to monitor his progress. Ismael is doing well in rehab. He has improved to level 2 on the biostep and continues to work at level 3 on the T4 nustep. He also increased his overall average MET level to 2.76 METs. He has continued to do well with 3 lb hand weights for resistance training as well. We will continue to monitor his progress in the program.      Expected Outcomes Short: Increase workload on BioStep and try 4 lb weights Long: Continue to improve stamina Short: Continue to progressively increase workloads and try 4 lb hand weights. Long: Continue to improve strength and stamina.               Discharge Exercise Prescription (Final Exercise Prescription Changes):  Exercise Prescription Changes - 06/06/23 1600       Response to Exercise   Blood Pressure (Admit) 122/82    Blood Pressure (Exit) 124/80    Heart Rate (Admit) 90 bpm    Heart Rate (Exercise) 96 bpm    Heart Rate (Exit) 88 bpm    Oxygen Saturation (Admit) 95 %    Oxygen Saturation (Exercise) 88 %    Oxygen Saturation (Exit) 92 %    Rating of Perceived Exertion (Exercise) 13    Perceived Dyspnea (Exercise) 3    Symptoms SOB    Duration Continue with 30 min of aerobic exercise without signs/symptoms of physical distress.    Intensity THRR unchanged      Progression   Progression Continue to progress workloads to maintain intensity without signs/symptoms of physical distress.    Average METs 2.76      Resistance Training   Training Prescription Yes    Weight 3 lb    Reps 10-15      Interval Training   Interval Training No      Recumbant Bike   Level 2    Watts 16    Minutes 15    METs 2.53       NuStep   Level 3    Minutes 30    METs 3.7      T5 Nustep   Level 1    Minutes 15      Biostep-RELP   Level 2    Minutes 15    METs 2      Home Exercise Plan   Plans to continue exercise at Home (comment)   Elcipse Machine   Frequency Add 2 additional days to program exercise sessions.    Initial Home Exercises Provided 05/04/23      Oxygen   Maintain Oxygen Saturation 88% or higher             Nutrition:  Target Goals: Understanding of nutrition guidelines, daily intake of sodium 1500mg , cholesterol 200mg , calories 30% from fat and 7% or less from saturated fats, daily to have 5 or more servings of fruits and vegetables.  Education: All About Nutrition: -Group instruction provided by verbal, written material, interactive activities, discussions, models, and posters to present general guidelines for heart healthy nutrition including fat, fiber, MyPlate, the role of sodium in heart healthy nutrition, utilization of the nutrition label, and utilization of this knowledge for meal planning. Follow up email sent as well. Written material given at graduation. Flowsheet Row Pulmonary Rehab from 05/11/2023 in [facility] Card[facility]monary Rehab  Date --  [part 2]       Biometrics:  Pre Biometrics - 04/06/23 1009       Pre Biometrics   Waist Circumference 32 inches    Hip Circumference 43 inches    Waist to Hip Ratio 0.74 %              Nutrition Therapy Plan and Nutrition Goals:  Nutrition Therapy & Goals - 05/04/23 1042       Nutrition Therapy   RD appointment deferred Yes             Nutrition Assessments:  MEDIFICTS Score Key: ?70 Need to make dietary changes  40-70 Heart Healthy Diet ? 40 Therapeutic Level Cholesterol Diet  Flowsheet Row Pulmonary Rehab from 04[facility]04/2023 in [facility] Cardiac and Pulmonary Rehab  Picture Your Plate Total Score on Admission 55      Picture Your Plate Scores: <41 Unhealthy dietary pattern with much room for  improvement. 41-50 Dietary pattern unlikely to meet recommendations for good health and room for improvement. 51-60 More healthful dietary pattern, with some room for improvement.  >60 Healthy dietary pattern, although there may be some specific behaviors that could be improved.   Nutrition Goals Re-Evaluation:  Nutrition Goals Re-Evaluation     Row Name 05/04/23 1041 05/27/23 1031           Goals   Current Weight -- 208 lb (94.3 kg)      Nutrition Goal -- Eat smaller meals      Comment Kristain's family does most of the cooking for he and his bed bound wife. He mentions he eats well and has no nutrition concerns. He does not want to meet with the dietician at this time. He states his appetite is good and he feels like his needs are being met Patient was informed on why it is important to maintain a balanced diet when dealing with Respiratory issues. Explained that it takes a lot of energy to breath and when they are short of breath often they will need to have a good diet to help keep up with the calories they are expending for breathing.      Expected Outcome Short: continue maitenance with diet  Long: continue to follow a healthy pulmonary diet. Short: Choose and plan snacks accordingly to patients caloric intake to improve breathing. Long: Maintain a diet independently that meets their caloric intake to aid in daily shortness of breath.               Nutrition Goals Discharge (Final Nutrition Goals Re-Evaluation):  Nutrition Goals Re-Evaluation - 05/27/23 1031       Goals   Current Weight 208 lb (94.3 kg)    Nutrition Goal Eat smaller meals    Comment Patient was informed on why it is important to maintain a balanced diet when dealing with Respiratory issues. Explained that it takes a lot of energy to breath and when they are short of breath often they will need to have a good diet to help keep up with the calories they are expending for breathing.    Expected Outcome Short: Choose  and plan snacks accordingly to patients caloric intake to improve breathing. Long: Maintain a diet independently that meets their caloric intake to aid in daily shortness of breath.             Psychosocial: Target Goals: Acknowledge presence or absence of significant depression and/or stress, maximize coping skills, provide positive support system. Participant is able  to verbalize types and ability to use techniques and skills needed for reducing stress and depression.   Education: Stress, Anxiety, and Depression - Group verbal and visual presentation to define topics covered.  Reviews how body is impacted by stress, anxiety, and depression.  Also discusses healthy ways to reduce stress and to treat/manage anxiety and depression.  Written material given at graduation.   Education: Sleep Hygiene -Provides group verbal and written instruction about how sleep can affect your health.  Define sleep hygiene, discuss sleep cycles and impact of sleep habits. Review good sleep hygiene tips.    Initial Review & Psychosocial Screening:  Initial Psych Review & Screening - 03/21/23 1556       Initial Review   Current issues with None Identified      Family Dynamics   Good Support System? Yes   daughter, Rosey Bath     Barriers   Psychosocial barriers to participate in program There are no identifiable barriers or psychosocial needs.      Screening Interventions   Interventions Encouraged to exercise;To provide support and resources with identified psychosocial needs;Provide feedback about the scores to participant    Expected Outcomes Short Term goal: Utilizing psychosocial counselor, staff and physician to assist with identification of specific Stressors or current issues interfering with healing process. Setting desired goal for each stressor or current issue identified.;Long Term Goal: Stressors or current issues are controlled or eliminated.;Short Term goal: Identification and review with  participant of any Quality of Life or Depression concerns found by scoring the questionnaire.;Long Term goal: The participant improves quality of Life and PHQ9 Scores as seen by post scores and/or verbalization of changes             Quality of Life Scores:  Scores of 19 and below usually indicate a poorer quality of life in these areas.  A difference of  2-3 points is a clinically meaningful difference.  A difference of 2-3 points in the total score of the Quality of Life Index has been associated with significant improvement in overall quality of life, self-image, physical symptoms, and general health in studies assessing change in quality of life.  PHQ-9: Review Flowsheet  More data exists      04/04/2023 01/24/2023 10/26/2022 10/21/2021 11/13/2020  Depression screen PHQ 2/9  Decreased Interest 0 0 0 0 0  Down, Depressed, Hopeless 0 1 0 0 0  PHQ - 2 Score 0 1 0 0 0  Altered sleeping 0 2 0 0 -  Tired, decreased energy 3 3 3 1  -  Change in appetite 1 1 0 0 -  Feeling bad or failure about yourself  0 0 0 0 -  Trouble concentrating 0 1 0 0 -  Moving slowly or fidgety/restless 0 0 0 0 -  Suicidal thoughts 0 0 0 0 -  PHQ-9 Score 4 8 3 1  -  Difficult doing work/chores Not difficult at all Not difficult at all Not difficult at all Not difficult at all -   Interpretation of Total Score  Total Score Depression Severity:  1-4 = Minimal depression, 5-9 = Mild depression, 10-14 = Moderate depression, 15-19 = Moderately severe depression, 20-27 = Severe depression   Psychosocial Evaluation and Intervention:  Psychosocial Evaluation - 03/21/23 1608       Psychosocial Evaluation & Interventions   Interventions Encouraged to exercise with the program and follow exercise prescription    Comments Hilal has no barriers to attending the program. He wants to leave the program  breathing better. He has support from his daughter Rosey Bath. He has the desire to breath easier . He is ready to start the  program    Expected Outcomes STG Geofrey is able to attend all scheduled sessions, he is able to progress with his exercise prescription and work on breathing techniques to improve his day to day breathing with his daily activities.  LTG Gokul continues to work on his exerise ad breathing techniques    Continue Psychosocial Services  Follow up required by staff             Psychosocial Re-Evaluation:  Psychosocial Re-Evaluation     Row Name 05/04/23 1027 05/27/23 1032           Psychosocial Re-Evaluation   Current issues with Current Stress Concerns;Current Sleep Concerns Current Stress Concerns      Comments Keshan reports his biggest stressor is managing his wife's care with Alzheimers. She has had it for a few years and is now bed bound. He has his daughter and aides helping him out. They help with shopping and some chores as well as stay the night with his wife. He worries about his wife and mentions he knows he needs to take care of himself as well. He reports he thinks he sleeps very well, however his doctor is sending him for a sleep study. They are a few months out on the schedule, so he doesn't know when he will go back for the actual study. He stays busy with his doctors appointments and caring for his wife. He does know that this program is good for him because it is making him take time for himself. Patient reports no changes with their current mental states, sleep, stress, depression or anxiety. Will follow up with patient in a few weeks for any changes.      Expected Outcomes Short: attend pulmonary rehab consistently for exercise and education on breathing techniques and lifestyle changes. Long: independently maintain positive self care habits. Short: Continue to exercise regularly to support mental health and notify staff of any changes. Long: maintain mental health and well being through teaching of rehab or prescribed medications independently.      Interventions Stress  management education Encouraged to attend Pulmonary Rehabilitation for the exercise      Continue Psychosocial Services  Follow up required by staff Follow up required by staff               Psychosocial Discharge (Final Psychosocial Re-Evaluation):  Psychosocial Re-Evaluation - 05/27/23 1032       Psychosocial Re-Evaluation   Current issues with Current Stress Concerns    Comments Patient reports no changes with their current mental states, sleep, stress, depression or anxiety. Will follow up with patient in a few weeks for any changes.    Expected Outcomes Short: Continue to exercise regularly to support mental health and notify staff of any changes. Long: maintain mental health and well being through teaching of rehab or prescribed medications independently.    Interventions Encouraged to attend Pulmonary Rehabilitation for the exercise    Continue Psychosocial Services  Follow up required by staff             Education: Education Goals: Education classes will be provided on a weekly basis, covering required topics. Participant will state understanding/return demonstration of topics presented.  Learning Barriers/Preferences:   General Pulmonary Education Topics:  Infection Prevention: - Provides verbal and written material to individual with discussion of infection control including proper hand  washing and proper equipment cleaning during exercise session. Flowsheet Row Pulmonary Rehab from 05/11/2023 in [facility] Cardiac and Pulmonary Rehab  Date 04/04/23  Educator [facility]  Instruction Review Code 1- Verbalizes Understanding       Falls Prevention: - Provides verbal and written material to individual with discussion of falls prevention and safety. Flowsheet Row Pulmonary Rehab from 05/11/2023 in [facility] Cardiac and Pulmonary Rehab  Date 03/21/23  Educator [facility]  Instruction Review Code 1- Verbalizes Understanding       Chronic Lung Disease Review: - Group verbal instruction  with posters, models, PowerPoint presentations and videos,  to review new updates, new respiratory medications, new advancements in procedures and treatments. Providing information on websites and "800" numbers for continued self-education. Includes information about supplement oxygen, available portable oxygen systems, continuous and intermittent flow rates, oxygen safety, concentrators, and Medicare reimbursement for oxygen. Explanation of Pulmonary Drugs, including class, frequency, complications, importance of spacers, rinsing mouth after steroid MDI's, and proper cleaning methods for nebulizers. Review of basic lung anatomy and physiology related to function, structure, and complications of lung disease. Review of risk factors. Discussion about methods for diagnosing sleep apnea and types of masks and machines for OSA. Includes a review of the use of types of environmental controls: home humidity, furnaces, filters, dust mite/pet prevention, HEPA vacuums. Discussion about weather changes, air quality and the benefits of nasal washing. Instruction on Warning signs, infection symptoms, calling MD promptly, preventive modes, and value of vaccinations. Review of effective airway clearance, coughing and/or vibration techniques. Emphasizing that all should Create an Action Plan. Written material given at graduation. Flowsheet Row Pulmonary Rehab from 05/11/2023 in [facility] Cardiac and Pulmonary Rehab  Education need identified 04/04/23  Date 05/11/23  Educator [facility]  Instruction Review Code 1- Verbalizes Understanding       AED/CPR: - Group verbal and written instruction with the use of models to demonstrate the basic use of the AED with the basic ABC's of resuscitation.    Anatomy and Cardiac Procedures: - Group verbal and visual presentation and models provide information about basic cardiac anatomy and function. Reviews the testing methods done to diagnose heart disease and the outcomes of the test  results. Describes the treatment choices: Medical Management, Angioplasty, or Coronary Bypass Surgery for treating various heart conditions including Myocardial Infarction, Angina, Valve Disease, and Cardiac Arrhythmias.  Written material given at graduation.   Medication Safety: - Group verbal and visual instruction to review commonly prescribed medications for heart and lung disease. Reviews the medication, class of the drug, and side effects. Includes the steps to properly store meds and maintain the prescription regimen.  Written material given at graduation.   Other: -Provides group and verbal instruction on various topics (see comments)   Knowledge Questionnaire Score:  Knowledge Questionnaire Score - 04/04/23 1541       Knowledge Questionnaire Score   Pre Score 16/18              Core Components/Risk Factors/Patient Goals at Admission:  Personal Goals and Risk Factors at Admission - 04/04/23 1541       Core Components/Risk Factors/Patient Goals on Admission    Weight Management Yes;Weight Loss    Intervention Weight Management: Develop a combined nutrition and exercise program designed to reach desired caloric intake, while maintaining appropriate intake of nutrient and fiber, sodium and fats, and appropriate energy expenditure required for the weight goal.;Weight Management: Provide education and appropriate resources to help participant work on and attain dietary goals.;Weight Management/Obesity: Establish  reasonable short term and long term weight goals.    Admit Weight 210 lb 1.6 oz (95.3 kg)    Goal Weight: Short Term 205 lb (93 kg)    Goal Weight: Long Term 200 lb (90.7 kg)    Expected Outcomes Short Term: Continue to assess and modify interventions until short term weight is achieved;Long Term: Adherence to nutrition and physical activity/exercise program aimed toward attainment of established weight goal;Weight Loss: Understanding of general recommendations for a  balanced deficit meal plan, which promotes 1-2 lb weight loss per week and includes a negative energy balance of [phone] kcal/d;Understanding recommendations for meals to include 15-35% energy as protein, 25-35% energy from fat, 35-60% energy from carbohydrates, less than 200mg  of dietary cholesterol, 20-35 gm of total fiber daily;Understanding of distribution of calorie intake throughout the day with the consumption of 4-5 meals/snacks    Improve shortness of breath with ADL's Yes    Intervention Provide education, individualized exercise plan and daily activity instruction to help decrease symptoms of SOB with activities of daily living.    Expected Outcomes Short Term: Improve cardiorespiratory fitness to achieve a reduction of symptoms when performing ADLs;Long Term: Be able to perform more ADLs without symptoms or delay the onset of symptoms    Increase knowledge of respiratory medications and ability to use respiratory devices properly  Yes    Intervention Provide education and demonstration as needed of appropriate use of medications, inhalers, and oxygen therapy.    Expected Outcomes Short Term: Achieves understanding of medications use. Understands that oxygen is a medication prescribed by physician. Demonstrates appropriate use of inhaler and oxygen therapy.;Long Term: Maintain appropriate use of medications, inhalers, and oxygen therapy.    Hypertension Yes    Intervention Provide education on lifestyle modifcations including regular physical activity/exercise, weight management, moderate sodium restriction and increased consumption of fresh fruit, vegetables, and low fat dairy, alcohol moderation, and smoking cessation.;Monitor prescription use compliance.    Expected Outcomes Short Term: Continued assessment and intervention until BP is < 140/64mm HG in hypertensive participants. < 130/38mm HG in hypertensive participants with diabetes, heart failure or chronic kidney disease.;Long Term:  Maintenance of blood pressure at goal levels.    Lipids Yes    Intervention Provide education and support for participant on nutrition & aerobic/resistive exercise along with prescribed medications to achieve LDL 70mg , HDL >40mg .    Expected Outcomes Short Term: Participant states understanding of desired cholesterol values and is compliant with medications prescribed. Participant is following exercise prescription and nutrition guidelines.;Long Term: Cholesterol controlled with medications as prescribed, with individualized exercise RX and with personalized nutrition plan. Value goals: LDL < 70mg , HDL > 40 mg.             Education:Diabetes - Individual verbal and written instruction to review signs/symptoms of diabetes, desired ranges of glucose level fasting, after meals and with exercise. Acknowledge that pre and post exercise glucose checks will be done for 3 sessions at entry of program.   Know Your Numbers and Heart Failure: - Group verbal and visual instruction to discuss disease risk factors for cardiac and pulmonary disease and treatment options.  Reviews associated critical values for Overweight/Obesity, Hypertension, Cholesterol, and Diabetes.  Discusses basics of heart failure: signs/symptoms and treatments.  Introduces Heart Failure Zone chart for action plan for heart failure.  Written material given at graduation.   Core Components/Risk Factors/Patient Goals Review:   Goals and Risk Factor Review     Row Name 05/04/23 1021 05/27/23 1030  Core Components/Risk Factors/Patient Goals Review   Personal Goals Review Improve shortness of breath with ADL's;Hypertension Improve shortness of breath with ADL's      Review Suheyb reports feeling at his baseline. He hasn't seen any improvemenet since starting the program, but he is motivated to continue coming and is optimistic to see what his progress will be. His blood pressure has been stable so far in the program. He does  not check it regularly at home. He does have a pulse ox which he uses sometimes. He has a lot of help at home with shopping and chores, but he does note that he has a hard time doing some things around the house because of his breathing. He tries to remember to do his PLB and rest as needed. He had a knee replacement during COVID and couldn't go to rehab, so he has become more sedentary which he states that his breathing has gotten worse since then. He is trying to be more active by coming to the program. Spoke to patient about their shortness of breath and what they can do to improve. Patient has been informed of breathing techniques when starting the program. Patient is informed to tell staff if they have had any med changes and that certain meds they are taking or not taking can be causing shortness of breath.      Expected Outcomes Short: attend pulmonary rehab to work on his shortness of breath with ADLs. Long: independently manage breathing techniques Short: Attend [facility] regularly to improve shortness of breath with ADL's. Long: maintain independence with ADL's               Core Components/Risk Factors/Patient Goals at Discharge (Final Review):   Goals and Risk Factor Review - 05/27/23 1030       Core Components/Risk Factors/Patient Goals Review   Personal Goals Review Improve shortness of breath with ADL's    Review Spoke to patient about their shortness of breath and what they can do to improve. Patient has been informed of breathing techniques when starting the program. Patient is informed to tell staff if they have had any med changes and that certain meds they are taking or not taking can be causing shortness of breath.    Expected Outcomes Short: Attend [facility] regularly to improve shortness of breath with ADL's. Long: maintain independence with ADL's             ITP Comments:  ITP Comments     Row Name 03/21/23 1614 04/04/23 1533 04/06/23 1002 04/13/23 1326 05/11/23  0919   ITP Comments Virtual orientation call completed today. he has an appointment on Date: 04/04/2023  for EP eval and gym Orientation.  Documentation of diagnosis can be found in [facility] Date: [id number] . Completed and gym orientation. Initial ITP created and sent for review to Dr. Jinny Sanders, Medical Director. First full day of exercise!  Patient was oriented to gym and equipment including functions, settings, policies, and procedures.  Patient's individual exercise prescription and treatment plan were reviewed.  All starting workloads were established based on the results of the 6 minute walk test done at initial orientation visit.  The plan for exercise progression was also introduced and progression will be customized based on patient's performance and goals. 30 day review completed. ITP sent to Dr. Jinny Sanders, Medical Director of  Pulmonary Rehab. Continue with ITP unless changes are made by physician. 30 Day review completed. Medical Director ITP review done, changes made as directed,  and signed approval by Medical Director.    Row Name 06/08/23 1103           ITP Comments 30 Day review completed. Medical Director ITP review done, changes made as directed, and signed approval by Medical Director.                Comments:

## 2023-06-09 ENCOUNTER — Telehealth: Payer: Self-pay | Admitting: Family Medicine

## 2023-06-09 DIAGNOSIS — R7309 Other abnormal glucose: Secondary | ICD-10-CM | POA: Diagnosis not present

## 2023-06-09 DIAGNOSIS — Z8739 Personal history of other diseases of the musculoskeletal system and connective tissue: Secondary | ICD-10-CM | POA: Diagnosis not present

## 2023-06-09 DIAGNOSIS — M20031 Swan-neck deformity of right finger(s): Secondary | ICD-10-CM | POA: Diagnosis not present

## 2023-06-09 DIAGNOSIS — M21949 Unspecified acquired deformity of hand, unspecified hand: Secondary | ICD-10-CM | POA: Diagnosis not present

## 2023-06-09 DIAGNOSIS — M20032 Swan-neck deformity of left finger(s): Secondary | ICD-10-CM | POA: Diagnosis not present

## 2023-06-09 NOTE — Telephone Encounter (Signed)
The daughter called in requesting a walker with a seat to send to [facility]. Please assist patient further

## 2023-06-10 ENCOUNTER — Encounter: Payer: Medicare HMO | Admitting: *Deleted

## 2023-06-10 DIAGNOSIS — J986 Disorders of diaphragm: Secondary | ICD-10-CM | POA: Diagnosis not present

## 2023-06-10 DIAGNOSIS — J449 Chronic obstructive pulmonary disease, unspecified: Secondary | ICD-10-CM | POA: Diagnosis not present

## 2023-06-10 NOTE — Progress Notes (Signed)
Daily Session Note  Patient Details  Name: Daniel Carr MRN: [id number] Date of Birth: 1935/06/15 Referring Provider:   Flowsheet Row Pulmonary Rehab from 04/04/2023 in [facility] Cardiac and Pulmonary Rehab  Referring Provider Mila Merry MD       Encounter Date: 06/10/2023  Check In:  Session Check In - 06/10/23 1004       Check-In   Supervising physician immediately available to respond to emergencies See telemetry face sheet for immediately available ER MD    Location [facility]-Cardiac & Pulmonary Rehab    Staff Present Cora Collum, RN, BSN, CCRP;Joseph Hood, RCP,RRT,BSRT;Noah Tickle, [place], Exercise Physiologist    Virtual Visit No    Medication changes reported     Yes    Comments prednisone taper    Fall or balance concerns reported    No    Warm-up and Cool-down Performed on first and last piece of equipment    Resistance Training Performed Yes    VAD Patient? No    PAD/SET Patient? No      Pain Assessment   Currently in Pain? No/denies                Social History   Tobacco Use  Smoking Status Former   Packs/day: 0.25   Years: 6.00   Additional pack years: 0.00   Total pack years: 1.50   Types: Cigarettes   Quit date: 11/13/1969   Years since quitting: 53.6  Smokeless Tobacco Never  Tobacco Comments   quit in 1970's    Goals Met:  Proper associated with RPD/PD & O2 Sat Independence with exercise equipment Exercise tolerated well No report of concerns or symptoms today  Goals Unmet:  Not Applicable  Comments: Pt able to follow exercise prescription today without complaint.  Will continue to monitor for progression.     Dr. Bethann Punches is Medical Director for [facility] Cardiac Rehabilitation.  Dr. Vida Rigger is Medical Director for [facility] Pulmonary Rehabilitation.

## 2023-06-10 NOTE — Telephone Encounter (Signed)
Order printed, signed and sent to MR to fax

## 2023-06-10 NOTE — Telephone Encounter (Signed)
Pt daughter is calling to f/u on the request for a walker that has seat under.  Stated insurance company advised her that a referral was needed.   Please advise.

## 2023-06-13 NOTE — Telephone Encounter (Signed)
Pt daughter is calling back to check on the status for the walker with the seat on it for the pt. Pt daughter would like for the prescription for the walker  to be sent to [facility].   Please advise.

## 2023-06-14 ENCOUNTER — Telehealth: Payer: Self-pay

## 2023-06-14 NOTE — Telephone Encounter (Signed)
Copied from CRM [id number]. Topic: General - Other >> Jun 14, 2023  1:25 PM Everette C wrote: Reason for CRM: The patient's daughter has called to request that a prescription for a rollator walker   Please fax the order to [phone]   Please contact further when possible

## 2023-06-15 ENCOUNTER — Encounter: Payer: Medicare HMO | Admitting: *Deleted

## 2023-06-15 DIAGNOSIS — J449 Chronic obstructive pulmonary disease, unspecified: Secondary | ICD-10-CM

## 2023-06-15 DIAGNOSIS — J986 Disorders of diaphragm: Secondary | ICD-10-CM | POA: Diagnosis not present

## 2023-06-15 NOTE — Progress Notes (Signed)
Daily Session Note  Patient Details  Name: Daniel Carr MRN: [id number] Date of Birth: 03-Feb-1935 Referring Provider:   Flowsheet Row Pulmonary Rehab from 04/04/2023 in [facility] Cardiac and Pulmonary Rehab  Referring Provider Mila Merry MD       Encounter Date: 06/15/2023  Check In:  Session Check In - 06/15/23 1108       Check-In   Supervising physician immediately available to respond to emergencies See telemetry face sheet for immediately available ER MD    Location [facility]-Cardiac & Pulmonary Rehab    Staff Present Lanny Hurst, RN, ADN;Joseph Reino Kent, RCP,RRT,BSRT;Kristen Coble, RN,BC,MSN    Virtual Visit No    Medication changes reported     No    Fall or balance concerns reported    No    Warm-up and Cool-down Performed on first and last piece of equipment    Resistance Training Performed Yes    VAD Patient? No    PAD/SET Patient? No      Pain Assessment   Currently in Pain? No/denies                Social History   Tobacco Use  Smoking Status Former   Packs/day: 0.25   Years: 6.00   Additional pack years: 0.00   Total pack years: 1.50   Types: Cigarettes   Quit date: 11/13/1969   Years since quitting: 53.6  Smokeless Tobacco Never  Tobacco Comments   quit in 1970's    Goals Met:  Independence with exercise equipment Exercise tolerated well No report of concerns or symptoms today Strength training completed today  Goals Unmet:  Not Applicable  Comments: Pt able to follow exercise prescription today without complaint.  Will continue to monitor for progression.    Dr. Bethann Punches is Medical Director for [facility] Cardiac Rehabilitation.  Dr. Vida Rigger is Medical Director for [facility] Pulmonary Rehabilitation.

## 2023-06-15 NOTE — Telephone Encounter (Addendum)
The order was printed, signed and sent to medical records last week to be faxed to [facility]. I reprinted Monday and sent to medical records again to be faxed.

## 2023-06-15 NOTE — Telephone Encounter (Signed)
Pt's daughter is calling in because [facility] still hasn't received the paperwork that has been faxed. Rosey Bath says she will contact [facility] to get another fax number to send it to.

## 2023-06-16 ENCOUNTER — Telehealth: Payer: Self-pay | Admitting: Family Medicine

## 2023-06-16 NOTE — Telephone Encounter (Signed)
Sharika calling from [facility] is calling to request office Visit notes for the rolator as long as related to the need. [phone] Fax- [phone]

## 2023-06-17 ENCOUNTER — Encounter: Payer: Medicare HMO | Admitting: *Deleted

## 2023-06-17 DIAGNOSIS — J986 Disorders of diaphragm: Secondary | ICD-10-CM

## 2023-06-17 DIAGNOSIS — J449 Chronic obstructive pulmonary disease, unspecified: Secondary | ICD-10-CM

## 2023-06-17 NOTE — Progress Notes (Signed)
Daily Session Note  Patient Details  Name: Yamato Kopf MRN: [id number] Date of Birth: 02-26-1935 Referring Provider:   Flowsheet Row Pulmonary Rehab from 04/04/2023 in [facility] Cardiac and Pulmonary Rehab  Referring Provider Mila Merry MD       Encounter Date: 06/17/2023  Check In:  Session Check In - 06/17/23 1037       Check-In   Supervising physician immediately available to respond to emergencies See telemetry face sheet for immediately available ER MD    Location [facility]-Cardiac & Pulmonary Rehab    Staff Present Cora Collum, RN, BSN, CCRP;Other;Megan Katrinka Blazing, RN, [place]   [place] Bigelow HFS   Virtual Visit No    Medication changes reported     No    Fall or balance concerns reported    No    Warm-up and Cool-down Performed on first and last piece of equipment    Resistance Training Performed Yes    VAD Patient? No    PAD/SET Patient? No      Pain Assessment   Currently in Pain? No/denies                Social History   Tobacco Use  Smoking Status Former   Packs/day: 0.25   Years: 6.00   Additional pack years: 0.00   Total pack years: 1.50   Types: Cigarettes   Quit date: 11/13/1969   Years since quitting: 53.6  Smokeless Tobacco Never  Tobacco Comments   quit in 1970's    Goals Met:  Proper associated with RPD/PD & O2 Sat Independence with exercise equipment Exercise tolerated well No report of concerns or symptoms today  Goals Unmet:  Not Applicable  Comments: Pt able to follow exercise prescription today without complaint.  Will continue to monitor for progression.    Dr. Bethann Punches is Medical Director for [facility] Cardiac Rehabilitation.  Dr. Vida Rigger is Medical Director for [facility] Pulmonary Rehabilitation.

## 2023-06-20 DIAGNOSIS — J309 Allergic rhinitis, unspecified: Secondary | ICD-10-CM | POA: Diagnosis not present

## 2023-06-20 DIAGNOSIS — J449 Chronic obstructive pulmonary disease, unspecified: Secondary | ICD-10-CM | POA: Diagnosis not present

## 2023-06-20 DIAGNOSIS — Z7189 Other specified counseling: Secondary | ICD-10-CM | POA: Diagnosis not present

## 2023-06-20 DIAGNOSIS — M1990 Unspecified osteoarthritis, unspecified site: Secondary | ICD-10-CM | POA: Diagnosis not present

## 2023-06-20 DIAGNOSIS — I10 Essential (primary) hypertension: Secondary | ICD-10-CM | POA: Diagnosis not present

## 2023-06-22 ENCOUNTER — Encounter: Payer: Medicare HMO | Admitting: *Deleted

## 2023-06-22 DIAGNOSIS — J986 Disorders of diaphragm: Secondary | ICD-10-CM | POA: Diagnosis not present

## 2023-06-22 DIAGNOSIS — J449 Chronic obstructive pulmonary disease, unspecified: Secondary | ICD-10-CM

## 2023-06-22 NOTE — Progress Notes (Signed)
Daily Session Note  Patient Details  Name: Daniel Carr MRN: [id number] Date of Birth: May 17, 1935 Referring Provider:   Flowsheet Row Pulmonary Rehab from 04/04/2023 in [facility] Cardiac and Pulmonary Rehab  Referring Provider Mila Merry MD       Encounter Date: 06/22/2023  Check In:  Session Check In - 06/22/23 0946       Check-In   Supervising physician immediately available to respond to emergencies See telemetry face sheet for immediately available ER MD    Location [facility]-Cardiac & Pulmonary Rehab    Staff Present Lanny Hurst, RN, ADN;Kristen Coble, RN,BC,MSN;Noah Tickle, BS, Exercise Physiologist    Virtual Visit No    Medication changes reported     No    Fall or balance concerns reported    No    Warm-up and Cool-down Performed on first and last piece of equipment    Resistance Training Performed Yes    VAD Patient? No    PAD/SET Patient? No      Pain Assessment   Currently in Pain? No/denies                Social History   Tobacco Use  Smoking Status Former   Packs/day: 0.25   Years: 6.00   Additional pack years: 0.00   Total pack years: 1.50   Types: Cigarettes   Quit date: 11/13/1969   Years since quitting: 53.6  Smokeless Tobacco Never  Tobacco Comments   quit in 1970's    Goals Met:  Independence with exercise equipment Exercise tolerated well No report of concerns or symptoms today Strength training completed today  Goals Unmet:  Not Applicable  Comments: Pt able to follow exercise prescription today without complaint.  Will continue to monitor for progression.    Dr. Bethann Punches is Medical Director for [facility] Cardiac Rehabilitation.  Dr. Vida Rigger is Medical Director for [facility] Pulmonary Rehabilitation.

## 2023-06-24 DIAGNOSIS — J986 Disorders of diaphragm: Secondary | ICD-10-CM

## 2023-06-24 DIAGNOSIS — J449 Chronic obstructive pulmonary disease, unspecified: Secondary | ICD-10-CM

## 2023-06-24 DIAGNOSIS — E039 Hypothyroidism, unspecified: Secondary | ICD-10-CM | POA: Diagnosis not present

## 2023-06-24 DIAGNOSIS — I10 Essential (primary) hypertension: Secondary | ICD-10-CM | POA: Diagnosis not present

## 2023-06-24 DIAGNOSIS — M1990 Unspecified osteoarthritis, unspecified site: Secondary | ICD-10-CM | POA: Diagnosis not present

## 2023-06-24 DIAGNOSIS — I2510 Atherosclerotic heart disease of native coronary artery without angina pectoris: Secondary | ICD-10-CM | POA: Diagnosis not present

## 2023-06-24 DIAGNOSIS — G894 Chronic pain syndrome: Secondary | ICD-10-CM | POA: Diagnosis not present

## 2023-06-24 DIAGNOSIS — E7800 Pure hypercholesterolemia, unspecified: Secondary | ICD-10-CM | POA: Diagnosis not present

## 2023-06-24 NOTE — Progress Notes (Signed)
Daily Session Note  Patient Details  Name: Daniel Carr MRN: [id number] Date of Birth: 24-Apr-1935 Referring Provider:   Flowsheet Row Pulmonary Rehab from 04/04/2023 in [facility] Cardiac and Pulmonary Rehab  Referring Provider Mila Merry MD       Encounter Date: 06/24/2023  Check In:  Session Check In - 06/24/23 0951       Check-In   Supervising physician immediately available to respond to emergencies See telemetry face sheet for immediately available ER MD    Location [facility]-Cardiac & Pulmonary Rehab    Staff Present Darcel Bayley, RN,BC,MSN;Joseph Noreene Filbert [place], [place], ACSM CEP, Exercise Physiologist    Virtual Visit No    Medication changes reported     No    Fall or balance concerns reported    No    Tobacco Cessation No Change    Warm-up and Cool-down Performed on first and last piece of equipment    Resistance Training Performed Yes    VAD Patient? No    PAD/SET Patient? No      Pain Assessment   Currently in Pain? No/denies                Social History   Tobacco Use  Smoking Status Former   Packs/day: 0.25   Years: 6.00   Additional pack years: 0.00   Total pack years: 1.50   Types: Cigarettes   Quit date: 11/13/1969   Years since quitting: 53.6  Smokeless Tobacco Never  Tobacco Comments   quit in 1970's    Goals Met:  Independence with exercise equipment Exercise tolerated well No report of concerns or symptoms today Strength training completed today  Goals Unmet:  Not Applicable  Comments: Pt able to follow exercise prescription today without complaint.  Will continue to monitor for progression.    Dr. Bethann Punches is Medical Director for [facility] Cardiac Rehabilitation.  Dr. Vida Rigger is Medical Director for [facility] Pulmonary Rehabilitation.

## 2023-06-28 DIAGNOSIS — M2042 Other hammer toe(s) (acquired), left foot: Secondary | ICD-10-CM | POA: Diagnosis not present

## 2023-06-28 DIAGNOSIS — L600 Ingrowing nail: Secondary | ICD-10-CM | POA: Diagnosis not present

## 2023-06-28 DIAGNOSIS — M79674 Pain in right toe(s): Secondary | ICD-10-CM | POA: Diagnosis not present

## 2023-06-28 DIAGNOSIS — M898X9 Other specified disorders of bone, unspecified site: Secondary | ICD-10-CM | POA: Diagnosis not present

## 2023-06-28 DIAGNOSIS — M2012 Hallux valgus (acquired), left foot: Secondary | ICD-10-CM | POA: Diagnosis not present

## 2023-06-28 DIAGNOSIS — M2011 Hallux valgus (acquired), right foot: Secondary | ICD-10-CM | POA: Diagnosis not present

## 2023-06-28 DIAGNOSIS — M2041 Other hammer toe(s) (acquired), right foot: Secondary | ICD-10-CM | POA: Diagnosis not present

## 2023-06-28 DIAGNOSIS — B351 Tinea unguium: Secondary | ICD-10-CM | POA: Diagnosis not present

## 2023-06-28 DIAGNOSIS — M79675 Pain in left toe(s): Secondary | ICD-10-CM | POA: Diagnosis not present

## 2023-06-29 ENCOUNTER — Encounter: Payer: Medicare HMO | Attending: Family Medicine | Admitting: *Deleted

## 2023-06-29 DIAGNOSIS — J449 Chronic obstructive pulmonary disease, unspecified: Secondary | ICD-10-CM | POA: Insufficient documentation

## 2023-06-29 DIAGNOSIS — J986 Disorders of diaphragm: Secondary | ICD-10-CM | POA: Diagnosis not present

## 2023-06-29 NOTE — Progress Notes (Signed)
Daily Session Note  Patient Details  Name: Daniel Carr MRN: [id number] Date of Birth: 18-Sep-1935 Referring Provider:   Flowsheet Row Pulmonary Rehab from 04/04/2023 in [facility] Cardiac and Pulmonary Rehab  Referring Provider Mila Merry MD       Encounter Date: 06/29/2023  Check In:  Session Check In - 06/29/23 1006       Check-In   Supervising physician immediately available to respond to emergencies See telemetry face sheet for immediately available ER MD    Location [facility]-Cardiac & Pulmonary Rehab    Staff Present Darcel Bayley, RN,BC,MSN;Joseph Shelbie Proctor, RN, [place]    Virtual Visit No    Medication changes reported     No    Fall or balance concerns reported    No    Warm-up and Cool-down Performed on first and last piece of equipment    Resistance Training Performed Yes    VAD Patient? No    PAD/SET Patient? No      Pain Assessment   Currently in Pain? No/denies                Social History   Tobacco Use  Smoking Status Former   Packs/day: 0.25   Years: 6.00   Additional pack years: 0.00   Total pack years: 1.50   Types: Cigarettes   Quit date: 11/13/1969   Years since quitting: 53.6  Smokeless Tobacco Never  Tobacco Comments   quit in 1970's    Goals Met:  Independence with exercise equipment Exercise tolerated well No report of concerns or symptoms today Strength training completed today  Goals Unmet:  Not Applicable  Comments: Pt able to follow exercise prescription today without complaint.  Will continue to monitor for progression.    Dr. Bethann Punches is Medical Director for [facility] Cardiac Rehabilitation.  Dr. Vida Rigger is Medical Director for [facility] Pulmonary Rehabilitation.

## 2023-07-01 ENCOUNTER — Encounter: Payer: Medicare HMO | Admitting: *Deleted

## 2023-07-01 DIAGNOSIS — J449 Chronic obstructive pulmonary disease, unspecified: Secondary | ICD-10-CM | POA: Diagnosis not present

## 2023-07-01 DIAGNOSIS — J986 Disorders of diaphragm: Secondary | ICD-10-CM | POA: Diagnosis not present

## 2023-07-01 NOTE — Progress Notes (Signed)
Daily Session Note  Patient Details  Name: Kavion Hottinger MRN: [id number] Date of Birth: January 08, 1935 Referring Provider:   Flowsheet Row Pulmonary Rehab from 04/04/2023 in [facility] Cardiac and Pulmonary Rehab  Referring Provider Mila Merry MD       Encounter Date: 07/01/2023  Check In:  Session Check In - 07/01/23 1056       Check-In   Supervising physician immediately available to respond to emergencies See telemetry face sheet for immediately available ER MD    Location [facility]-Cardiac & Pulmonary Rehab    Staff Present Cora Collum, RN, BSN, CCRP;Other;Noah Tickle, BS, Exercise Physiologist   [place] Bigelow HFS   Virtual Visit No    Medication changes reported     No    Fall or balance concerns reported    No    Warm-up and Cool-down Performed on first and last piece of equipment    Resistance Training Performed Yes    VAD Patient? No    PAD/SET Patient? No      Pain Assessment   Currently in Pain? No/denies                Social History   Tobacco Use  Smoking Status Former   Packs/day: 0.25   Years: 6.00   Additional pack years: 0.00   Total pack years: 1.50   Types: Cigarettes   Quit date: 11/13/1969   Years since quitting: 53.6  Smokeless Tobacco Never  Tobacco Comments   quit in 1970's    Goals Met:  Proper associated with RPD/PD & O2 Sat Independence with exercise equipment Exercise tolerated well No report of concerns or symptoms today  Goals Unmet:  Not Applicable  Comments: Pt able to follow exercise prescription today without complaint.  Will continue to monitor for progression.    Dr. Bethann Punches is Medical Director for [facility] Cardiac Rehabilitation.  Dr. Vida Rigger is Medical Director for [facility] Pulmonary Rehabilitation.

## 2023-07-05 ENCOUNTER — Encounter: Payer: Self-pay | Admitting: *Deleted

## 2023-07-05 DIAGNOSIS — J449 Chronic obstructive pulmonary disease, unspecified: Secondary | ICD-10-CM

## 2023-07-05 DIAGNOSIS — J986 Disorders of diaphragm: Secondary | ICD-10-CM

## 2023-07-05 NOTE — Progress Notes (Signed)
Pulmonary Individual Treatment Plan  Patient Details  Name: Daniel Carr MRN: [id number] Date of Birth: March 31, 1935 Referring Provider:   Flowsheet Row Pulmonary Rehab from 04/04/2023 in [facility] Cardiac and Pulmonary Rehab  Referring Provider Mila Merry MD       Initial Encounter Date:  Flowsheet Row Pulmonary Rehab from 04/04/2023 in [facility] Cardiac and Pulmonary Rehab  Date 04/04/23       Visit Diagnosis: Stage 3 severe COPD by GOLD classification (HCC)  Diaphragm paralysis  Patient's Home Medications on Admission:  Current Outpatient Medications:    amLODipine (NORVASC) 2.5 MG tablet, Take 1 tablet by mouth once daily, Disp: 90 tablet, Rfl: 4   aspirin 81 MG EC tablet, Take 1 tablet (81 mg total) by mouth daily. Swallow whole., Disp: , Rfl:    azelastine (ASTELIN) 0.1 % nasal spray, Place 1 spray into the nose daily as needed (allergies)., Disp: , Rfl:    brimonidine (ALPHAGAN) 0.2 % ophthalmic solution, Place 1 drop into both eyes in the morning and at bedtime., Disp: , Rfl:    clotrimazole-betamethasone (LOTRISONE) cream, APPLY TOPICALLY ONCE DAILY (Patient not taking: Reported on 03/21/2023), Disp: 15 g, Rfl: 0   COD LIVER OIL PO, Take 1 capsule by mouth daily. (Patient not taking: Reported on 03/21/2023), Disp: , Rfl:    dorzolamide (TRUSOPT) 2 % ophthalmic solution, Apply to eye., Disp: , Rfl:    dutasteride (AVODART) 0.5 MG capsule, Take 1 capsule by mouth once daily, Disp: 90 capsule, Rfl: 4   enalapril-hydrochlorothiazide (VASERETIC) 10-25 MG tablet, hydroCHLOROthiazide 25mg  (Patient not taking: Reported on 03/21/2023), Disp: , Rfl:    hydrochlorothiazide (HYDRODIURIL) 25 MG tablet, TAKE ONE-HALF TABLET BY MOUTH ONCE DAILY, Disp: 45 tablet, Rfl: 03   ipratropium (ATROVENT) 0.06 % nasal spray, Place 1 spray into both nostrils 2 (two) times daily as needed for rhinitis., Disp: , Rfl: 6   latanoprost (XALATAN) 0.005 % ophthalmic solution, SMARTSIG:In Eye(s), Disp: , Rfl:     levocetirizine (XYZAL) 5 MG tablet, Take 5 mg by mouth every evening., Disp: , Rfl:    levothyroxine (SYNTHROID) 150 MCG tablet, Take 1 tablet by mouth once daily, Disp: 90 tablet, Rfl: 4   lovastatin (MEVACOR) 40 MG tablet, Take 1 tablet by mouth once daily, Disp: 90 tablet, Rfl: 3   metoprolol tartrate (LOPRESSOR) 100 MG tablet, Take tablet TWO hours prior to his cardiac CT scan., Disp: 1 tablet, Rfl: 0   Multiple Vitamin (DAILY VITAMIN PO), Take 1 tablet by mouth daily., Disp: , Rfl:    naproxen (NAPROSYN) 500 MG tablet, Take 1 tablet (500 mg total) by mouth 2 (two) times daily with a meal. (Patient not taking: Reported on 03/21/2023), Disp: 30 tablet, Rfl: 1   predniSONE (DELTASONE) 5 MG tablet, Take by mouth. (Patient not taking: Reported on 04/29/2023), Disp: , Rfl:    traMADol (ULTRAM) 50 MG tablet, Take 50 mg by mouth every 6 (six) hours as needed., Disp: , Rfl:    vitamin B-12 (CYANOCOBALAMIN) 100 MCG tablet, Take 100 mcg by mouth daily. Unsure of dose  is OTC med, Disp: , Rfl:   Past Medical History: Past Medical History:  Diagnosis Date   Arthritis    childhood infection    chicken pox, measels, and mumps   Glaucoma    Hypertension    Hypothyroidism    Sinus problem    Ulnar neuropathy    bilateral    Tobacco Use: Social History   Tobacco Use  Smoking Status Former  Packs/day: 0.25   Years: 6.00   Additional pack years: 0.00   Total pack years: 1.50   Types: Cigarettes   Quit date: 11/13/1969   Years since quitting: 53.6  Smokeless Tobacco Never  Tobacco Comments   quit in 1970's    Labs: Review Flowsheet  More data exists      Latest Ref Rng & Units 06/11/2019 07/22/2020 07/06/2021 03/29/2022 10/26/2022  Labs for ITP Cardiac and Pulmonary Rehab  Cholestrol 100 - 199 mg/dL 409  811  914  782  956   LDL (calc) 0 - 99 mg/dL 76  64  74  66  71   HDL-C >39 mg/dL 65  68  76  81  90   Trlycerides 0 - 149 mg/dL 76  89  61  60  57      Pulmonary Assessment Scores:   Pulmonary Assessment Scores     Row Name 04/04/23 1543         ADL UCSD   ADL Phase Entry     SOB Score total 72     Rest 3     Walk 4     Stairs 4     Bath 2     Dress 2     Shop 3       CAT Score   CAT Score 17       mMRC Score   mMRC Score 4              UCSD: Self-administered rating of dyspnea associated with activities of daily living (ADLs) 6-point scale (0 = "not at all" to 5 = "maximal or unable to do because of breathlessness")  Scoring Scores range from 0 to 120.  Minimally important difference is 5 units  CAT: CAT can identify the health impairment of COPD patients and is better correlated with disease progression.  CAT has a scoring range of zero to 40. The CAT score is classified into four groups of low (less than 10), medium (10 - 20), high (21-30) and very high (31-40) based on the impact level of disease on health status. A CAT score over 10 suggests significant symptoms.  A worsening CAT score could be explained by an exacerbation, poor medication adherence, poor inhaler technique, or progression of COPD or comorbid conditions.  CAT MCID is 2 points  mMRC: mMRC (Modified Medical Research Council) Dyspnea Scale is used to assess the degree of baseline functional disability in patients of respiratory disease due to dyspnea. No minimal important difference is established. A decrease in score of 1 point or greater is considered a positive change.   Pulmonary Function Assessment:  Pulmonary Function Assessment - 04/04/23 1543       Pulmonary Function Tests   FVC% 2.28 %   test from 02/04/21   FEV1% 1.17 %    FEV1/FVC Ratio 51.52      Breath   Shortness of Breath Yes;Limiting activity             Exercise Target Goals: Exercise Program Goal: Individual exercise prescription set using results from initial 6 min walk test and THRR while considering  patient's activity barriers and safety.   Exercise Prescription Goal: Initial exercise  prescription builds to 30-45 minutes a day of aerobic activity, 2-3 days per week.  Home exercise guidelines will be given to patient during program as part of exercise prescription that the participant will acknowledge.  Education: Aerobic Exercise: - Group verbal and visual presentation on the  components of exercise prescription. Introduces F.I.T.T principle from ACSM for exercise prescriptions.  Reviews F.I.T.T. principles of aerobic exercise including progression. Written material given at graduation.   Education: Resistance Exercise: - Group verbal and visual presentation on the components of exercise prescription. Introduces F.I.T.T principle from ACSM for exercise prescriptions  Reviews F.I.T.T. principles of resistance exercise including progression. Written material given at graduation.    Education: Exercise & Equipment Safety: - Individual verbal instruction and demonstration of equipment use and safety with use of the equipment. Flowsheet Row Pulmonary Rehab from 05/11/2023 in [facility] Cardiac and Pulmonary Rehab  Date 04/04/23  Educator [facility]  Instruction Review Code 1- Verbalizes Understanding       Education: Exercise Physiology & General Exercise Guidelines: - Group verbal and written instruction with models to review the exercise physiology of the cardiovascular system and associated critical values. Provides general exercise guidelines with specific guidelines to those with heart or lung disease.    Education: Flexibility, Balance, Mind/Body Relaxation: - Group verbal and visual presentation with interactive activity on the components of exercise prescription. Introduces F.I.T.T principle from ACSM for exercise prescriptions. Reviews F.I.T.T. principles of flexibility and balance exercise training including progression. Also discusses the mind body connection.  Reviews various relaxation techniques to help reduce and manage stress (i.e. Deep breathing, progressive muscle  relaxation, and visualization). Balance handout provided to take home. Written material given at graduation.   Activity Barriers & Risk Stratification:  Activity Barriers & Cardiac Risk Stratification - 04/04/23 1535       Activity Barriers & Cardiac Risk Stratification   Activity Barriers Joint Problems;Right Knee Replacement;Left Knee Replacement;Arthritis;Muscular Weakness;Deconditioning;Balance Concerns;Other (comment);Shortness of Breath;History of Falls;Assistive Device    Comments Arthritis all over causing deformities in hands, neuropathy in feet             6 Minute Walk:  6 Minute Walk     Row Name 04/04/23 1533         6 Minute Walk   Phase Initial     Distance 310 feet     Walk Time 5 minutes     # of Rest Breaks 1  1 minute rest     MPH 0.7     METS 0.51     RPE 13     Perceived Dyspnea  3     VO2 Peak 1.77     Symptoms Yes (comment)     Comments fatigue, SOB, chronic foot pain     Resting HR 75 bpm     Resting BP 134/66     Resting Oxygen Saturation  94 %     Exercise Oxygen Saturation  during 6 min walk 87 %     Max Ex. HR 102 bpm     Max Ex. BP 154/72     2 Minute Post BP 138/70       Interval HR   1 Minute HR 94     2 Minute HR 98     3 Minute HR 99     4 Minute HR 96     5 Minute HR 101     6 Minute HR 102     2 Minute Post HR 79     Interval Heart Rate? Yes       Interval Oxygen   Interval Oxygen? Yes     Baseline Oxygen Saturation % 94 %     1 Minute Oxygen Saturation % 89 %     1 Minute Liters of  Oxygen 0 L  Room Air     2 Minute Oxygen Saturation % 87 %     2 Minute Liters of Oxygen 0 L     3 Minute Oxygen Saturation % 84 %     3 Minute Liters of Oxygen 0 L     4 Minute Oxygen Saturation % 88 %     4 Minute Liters of Oxygen 0 L     5 Minute Oxygen Saturation % 87 %     5 Minute Liters of Oxygen 0 L     6 Minute Oxygen Saturation % 89 %     6 Minute Liters of Oxygen 0 L     2 Minute Post Oxygen Saturation % 98 %     2 Minute  Post Liters of Oxygen 0 L             Oxygen Initial Assessment:  Oxygen Initial Assessment - 04/04/23 1542       Home Oxygen   Home Oxygen Device None    Sleep Oxygen Prescription None    Home Exercise Oxygen Prescription None    Home Resting Oxygen Prescription None    Compliance with Home Oxygen Use Yes      Initial 6 min Walk   Oxygen Used None      Program Oxygen Prescription   Program Oxygen Prescription None      Intervention   Short Term Goals To learn and demonstrate proper pursed lip breathing techniques or other breathing techniques. ;To learn and understand importance of maintaining oxygen saturations>88%;To learn and demonstrate proper use of respiratory medications;To learn and understand importance of monitoring SPO2 with pulse oximeter and demonstrate accurate use of the pulse oximeter.    Long  Term Goals Exhibits proper breathing techniques, such as pursed lip breathing or other method taught during program session;Compliance with respiratory medication;Demonstrates proper use of MDI's;Maintenance of O2 saturations>88%;Verbalizes importance of monitoring SPO2 with pulse oximeter and return demonstration             Oxygen Re-Evaluation:  Oxygen Re-Evaluation     Row Name 04/06/23 1004 05/04/23 1055 05/27/23 1027         Program Oxygen Prescription   Program Oxygen Prescription -- None None       Home Oxygen   Home Oxygen Device -- None None     Sleep Oxygen Prescription -- None None     Home Exercise Oxygen Prescription -- None None     Home Resting Oxygen Prescription -- None None     Compliance with Home Oxygen Use -- Yes Yes       Goals/Expected Outcomes   Short Term Goals -- To learn and demonstrate proper pursed lip breathing techniques or other breathing techniques. ;To learn and understand importance of maintaining oxygen saturations>88%;To learn and demonstrate proper use of respiratory medications;To learn and understand importance of  monitoring SPO2 with pulse oximeter and demonstrate accurate use of the pulse oximeter. To learn and understand importance of maintaining oxygen saturations>88%;To learn and understand importance of monitoring SPO2 with pulse oximeter and demonstrate accurate use of the pulse oximeter.     Long  Term Goals -- Exhibits proper breathing techniques, such as pursed lip breathing or other method taught during program session;Compliance with respiratory medication;Demonstrates proper use of MDI's;Maintenance of O2 saturations>88%;Verbalizes importance of monitoring SPO2 with pulse oximeter and return demonstration Verbalizes importance of monitoring SPO2 with pulse oximeter and return demonstration;Maintenance of O2 saturations>88%  Comments Reviewed PLB technique with pt.  Talked about how it works and it's importance in maintaining their exercise saturations. Sharan is trying to remember to use his PLB when needed. He takes breaks and sits as needed. He hasn't noticed a difference in his breathing since starting the program yet, but is optimistic. He uses his pulse ox sometimes. He is on the list for a sleep study but doesn't know when it will be since they are a few months behind on the schedule Has a pulse oximeter to check her/his oxygen saturation at home. Informed him where to get one and explained why it is important to have one. Reviewed that oxygen saturations should be 88 percent and above.     Goals/Expected Outcomes Short: Become more profiecient at using PLB. Long: Become independent at using PLB. Short: get his sleep study scheduled and practice his PLB. Long: independently manage his breathing techniques Short: monitor oxygen at home with exertion. Long: maintain oxygen saturations above 88 percent independently.              Oxygen Discharge (Final Oxygen Re-Evaluation):  Oxygen Re-Evaluation - 05/27/23 1027       Program Oxygen Prescription   Program Oxygen Prescription None      Home  Oxygen   Home Oxygen Device None    Sleep Oxygen Prescription None    Home Exercise Oxygen Prescription None    Home Resting Oxygen Prescription None    Compliance with Home Oxygen Use Yes      Goals/Expected Outcomes   Short Term Goals To learn and understand importance of maintaining oxygen saturations>88%;To learn and understand importance of monitoring SPO2 with pulse oximeter and demonstrate accurate use of the pulse oximeter.    Long  Term Goals Verbalizes importance of monitoring SPO2 with pulse oximeter and return demonstration;Maintenance of O2 saturations>88%    Comments Has a pulse oximeter to check her/his oxygen saturation at home. Informed him where to get one and explained why it is important to have one. Reviewed that oxygen saturations should be 88 percent and above.    Goals/Expected Outcomes Short: monitor oxygen at home with exertion. Long: maintain oxygen saturations above 88 percent independently.             Initial Exercise Prescription:  Initial Exercise Prescription - 04/04/23 1500       Date of Initial Exercise RX and Referring Provider   Date 04/04/23    Referring Provider Mila Merry MD      Oxygen   Maintain Oxygen Saturation 88% or higher      Recumbant Bike   Level 1    RPM 50    Watts 5    Minutes 15    METs 2      NuStep   Level 1    SPM 80    Minutes 15    METs 2      T5 Nustep   Level 1    SPM 80    Minutes 1    METs 2      Biostep-RELP   Level 1    SPM 50    Minutes 15    METs 2      Prescription Details   Frequency (times per week) 2    Duration Progress to 30 minutes of continuous aerobic without signs/symptoms of physical distress      Intensity   THRR 40-80% of Max Heartrate 99-122    Ratings of Perceived Exertion 11-13  Perceived Dyspnea 0-4      Progression   Progression Continue to progress workloads to maintain intensity without signs/symptoms of physical distress.      Resistance Training   Training  Prescription Yes    Weight 3 lb    Reps 10-15             Perform Capillary Blood Glucose checks as needed.  Exercise Prescription Changes:   Exercise Prescription Changes     Row Name 04/04/23 1500 04/11/23 1300 04/25/23 1400 05/04/23 1000 05/09/23 1400     Response to Exercise   Blood Pressure (Admit) 134/66 128/64 114/60 -- --   Blood Pressure (Exercise) 154/72 136/70 146/60 -- --   Blood Pressure (Exit) 138/70 122/60 128/62 -- --   Heart Rate (Admit) 75 bpm 90 bpm 78 bpm -- 70 bpm   Heart Rate (Exercise) 102 bpm 100 bpm 102 bpm -- 111 bpm   Heart Rate (Exit) 80 bpm 100 bpm 89 bpm -- 82 bpm   Oxygen Saturation (Admit) 94 % 90 % 95 % -- 92 %   Oxygen Saturation (Exercise) 87 % 90 % 89 % -- 88 %   Oxygen Saturation (Exit) 95 % 95 % 97 % -- 96 %   Rating of Perceived Exertion (Exercise) 13 13 15  -- 13   Perceived Dyspnea (Exercise) 3 3 3  -- 3   Symptoms SOB, chronic foot pain SOB SOB -- SOB   Comments walk test results First full day of exercise -- -- --   Duration -- Progress to 30 minutes of  aerobic without signs/symptoms of physical distress Progress to 30 minutes of  aerobic without signs/symptoms of physical distress -- Progress to 30 minutes of  aerobic without signs/symptoms of physical distress   Intensity -- THRR unchanged THRR unchanged -- THRR unchanged     Progression   Progression -- Continue to progress workloads to maintain intensity without signs/symptoms of physical distress. Continue to progress workloads to maintain intensity without signs/symptoms of physical distress. -- Continue to progress workloads to maintain intensity without signs/symptoms of physical distress.   Average METs -- 2 2.53 -- 2.5     Resistance Training   Training Prescription -- Yes Yes -- Yes   Weight -- 3 lb 3 lb -- 3 lb   Reps -- 10-15 10-15 -- 10-15     Interval Training   Interval Training -- No No -- No     Treadmill   MPH -- -- 0.5 -- --   Grade -- -- 0 -- --   Minutes  -- -- 15 -- --   METs -- -- 1.38 -- --     Recumbant Bike   Level -- 1 1 -- 2   Watts -- 25 25 -- 18   Minutes -- 15 15 -- 15   METs -- -- 2.82 -- --     NuStep   Level -- -- 3 -- 3   Minutes -- -- 15 -- 15   METs -- -- 3.5 -- 2.7     REL-XR   Level -- -- 1 -- --   Minutes -- -- 15 -- --     T5 Nustep   Level -- 1 1  level 2 for 5 minutes -- 1   Minutes -- 15 15 -- 15   METs -- 2 -- -- 2.1     Biostep-RELP   Level -- -- 1 -- 1   Minutes -- -- 15 -- 15  METs -- -- 3 -- 3     Home Exercise Plan   Plans to continue exercise at -- -- -- Home (comment)  [employer] Home (comment)  [employer] [facility]   Frequency -- -- -- Add 2 additional days to program exercise sessions. Add 2 additional days to program exercise sessions.   Initial Home Exercises Provided -- -- -- 05/04/23 05/04/23     Oxygen   Maintain Oxygen Saturation -- 88% or higher 88% or higher -- 88% or higher    Row Name 05/24/23 0800 06/06/23 1600 06/22/23 1100 07/04/23 1600       Response to Exercise   Blood Pressure (Admit) 124/70 122/82 124/80 132/68    Blood Pressure (Exit) 102/60 124/80 118/64 104/60    Heart Rate (Admit) 69 bpm 90 bpm 89 bpm 192 bpm    Heart Rate (Exercise) 99 bpm 96 bpm 110 bpm 91 bpm    Heart Rate (Exit) 90 bpm 88 bpm 87 bpm 70 bpm    Oxygen Saturation (Admit) 91 % 95 % 93 % 95 %    Oxygen Saturation (Exercise) 91 % 88 % 89 % 96 %    Oxygen Saturation (Exit) 98 % 92 % 92 % 97 %    Rating of Perceived Exertion (Exercise) 12 13 13 13     Perceived Dyspnea (Exercise) 2 3 3 3     Symptoms SOB SOB SOB SOB    Duration Continue with 30 min of aerobic exercise without signs/symptoms of physical distress. Continue with 30 min of aerobic exercise without signs/symptoms of physical distress. Continue with 30 min of aerobic exercise without signs/symptoms of physical distress. Continue with 30 min of aerobic exercise without signs/symptoms of physical distress.    Intensity THRR unchanged THRR  unchanged THRR unchanged THRR unchanged      Progression   Progression Continue to progress workloads to maintain intensity without signs/symptoms of physical distress. Continue to progress workloads to maintain intensity without signs/symptoms of physical distress. Continue to progress workloads to maintain intensity without signs/symptoms of physical distress. Continue to progress workloads to maintain intensity without signs/symptoms of physical distress.    Average METs 2.67 2.76 2.65 2.25      Resistance Training   Training Prescription Yes Yes Yes Yes    Weight 3 lb 3 lb 3 lb 3 lb    Reps 10-15 10-15 10-15 10-15      Interval Training   Interval Training No No No No      Recumbant Bike   Level 1 2 1  --    Watts 13 16 16  --    Minutes 15 15 15  --    METs 2.6 2.53 2.54 --      NuStep   Level 3 3 3 3     Minutes 15 30 30 15     METs 2.2 3.7 3.7 --      REL-XR   Level 3 -- 1 1    Minutes 15 -- 15 15    METs 2.5 -- -- 1.9      T5 Nustep   Level -- 1 -- 1    Minutes -- 15 -- 15    METs -- -- -- 2.1      Biostep-RELP   Level 1 2 2 2     Minutes 15 15 15 15     METs 3 2 -- 3      Home Exercise Plan   Plans to continue exercise at Home (comment)  [facility] (comment)  [employer] Home (comment)  [employer] Machine Home (comment)  [employer] Machine    Frequency Add 2 additional days to program exercise sessions. Add 2 additional days to program exercise sessions. Add 2 additional days to program exercise sessions. Add 2 additional days to program exercise sessions.    Initial Home Exercises Provided 05/04/23 05/04/23 05/04/23 05/04/23      Oxygen   Maintain Oxygen Saturation 88% or higher 88% or higher 88% or higher 88% or higher             Exercise Comments:   Exercise Comments     Row Name 04/06/23 1003           Exercise Comments First full day of exercise!  Patient was oriented to gym and equipment including functions, settings, policies, and  procedures.  Patient's individual exercise prescription and treatment plan were reviewed.  All starting workloads were established based on the results of the 6 minute walk test done at initial orientation visit.  The plan for exercise progression was also introduced and progression will be customized based on patient's performance and goals.                Exercise Goals and Review:   Exercise Goals     Row Name 04/04/23 1538             Exercise Goals   Increase Physical Activity Yes       Intervention Provide advice, education, support and counseling about physical activity/exercise needs.;Develop an individualized exercise prescription for aerobic and resistive training based on initial evaluation findings, risk stratification, comorbidities and participant's personal goals.       Expected Outcomes Long Term: Add in home exercise to make exercise part of routine and to increase amount of physical activity.;Long Term: Exercising regularly at least 3-5 days a week.;Short Term: Attend rehab on a regular basis to increase amount of physical activity.       Increase Strength and Stamina Yes       Intervention Provide advice, education, support and counseling about physical activity/exercise needs.;Develop an individualized exercise prescription for aerobic and resistive training based on initial evaluation findings, risk stratification, comorbidities and participant's personal goals.       Expected Outcomes Short Term: Perform resistance training exercises routinely during rehab and add in resistance training at home;Short Term: Increase workloads from initial exercise prescription for resistance, speed, and METs.;Long Term: Improve cardiorespiratory fitness, muscular endurance and strength as measured by increased METs and functional capacity ( )       Able to understand and use rate of perceived exertion (RPE) scale Yes       Intervention Provide education and explanation on how to use  RPE scale       Expected Outcomes Short Term: Able to use RPE daily in rehab to express subjective intensity level;Long Term:  Able to use RPE to guide intensity level when exercising independently       Able to understand and use Dyspnea scale Yes       Intervention Provide education and explanation on how to use Dyspnea scale       Expected Outcomes Long Term: Able to use Dyspnea scale to guide intensity level when exercising independently;Short Term: Able to use Dyspnea scale daily in rehab to express subjective sense of shortness of breath during exertion       Knowledge and understanding of Target Heart Rate Range (THRR) Yes       Intervention Provide education and  explanation of THRR including how the numbers were predicted and where they are located for reference       Expected Outcomes Short Term: Able to state/look up THRR;Long Term: Able to use THRR to govern intensity when exercising independently;Short Term: Able to use daily as guideline for intensity in rehab       Able to check pulse independently Yes       Intervention Provide education and demonstration on how to check pulse in carotid and radial arteries.;Review the importance of being able to check your own pulse for safety during independent exercise       Expected Outcomes Short Term: Able to explain why pulse checking is important during independent exercise;Long Term: Able to check pulse independently and accurately       Understanding of Exercise Prescription Yes       Intervention Provide education, explanation, and written materials on patient's individual exercise prescription       Expected Outcomes Short Term: Able to explain program exercise prescription;Long Term: Able to explain home exercise prescription to exercise independently                Exercise Goals Re-Evaluation :  Exercise Goals Re-Evaluation     Row Name 04/06/23 1003 04/11/23 1352 04/25/23 1454 05/04/23 1029 05/09/23 1409     Exercise Goal  Re-Evaluation   Exercise Goals Review Able to understand and use rate of perceived exertion (RPE) scale;Able to understand and use Dyspnea scale;Knowledge and understanding of Target Heart Rate Range (THRR);Understanding of Exercise Prescription Increase Physical Activity;Increase Strength and Stamina;Understanding of Exercise Prescription Increase Physical Activity;Increase Strength and Stamina;Understanding of Exercise Prescription Increase Physical Activity;Increase Strength and Stamina;Understanding of Exercise Prescription;Able to understand and use rate of perceived exertion (RPE) scale;Knowledge and understanding of Target Heart Rate Range (THRR);Able to understand and use Dyspnea scale;Able to check pulse independently Increase Physical Activity;Increase Strength and Stamina;Understanding of Exercise Prescription   Comments Reviewed RPE scale, THR and program prescription with pt today.  Pt voiced understanding and was given a copy of goals to take home. Stokely is off to a good start in rehab. During his first day of exercise he was able to work at level 1 on both the T5 nustep and the recumbent bike. He also tolerated using 3 lb hand weights for resistance training. We will continue to monitor his progress in the program. Sheron continues to do well in rehab. He tried out level 2 on the T5 Nustep for about 5 minutes. He also increased to level 3 on the T4 Nustep. Ulyesses tried out the treadmill and was able to walk at a 0.5 mph speed. He had appropriate RPEs throughout his sessions. We will continue to monitoras he progresses. Achyuth is doing well in rehab.  He is already doing some exercise at home on his own.  Reviewed home exercise with pt today.  Pt plans to use Eclipse machine at home for exercise.  Reviewed THR, pulse, RPE, sign and symptoms, pulse oximetery and when to call 911 or MD.  Also discussed weather considerations and indoor options.  Pt voiced understanding. Abelino continues to do well in  rehab. He continues to work at an overall average MET level above 2.5 METs. He also continues to work at level 1 on the T5 nustep and biostep, and level 2 on the recumbent bike and T4 nustep. We will continue to monitor his progress in the program.   Expected Outcomes Short: Use RPE daily to regulate intensity. Long:  Follow program prescription in THR. Short: Continue to follow current exercise prescription. Long: Continue to improve strength and stamina. Short: Slowly increasing treadmill speed Long: Continue to increase overall MET level and stamina Short: Start to add in exercise at home routinely Long: Continue to exercise independently Short: Begin to progressively increase workloads. Long: Continue to improve strength and stamina.    Row Name 05/24/23 0755 06/06/23 1640 06/22/23 1140 07/04/23 1648       Exercise Goal Re-Evaluation   Exercise Goals Review Increase Physical Activity;Increase Strength and Stamina;Understanding of Exercise Prescription Increase Physical Activity;Increase Strength and Stamina;Understanding of Exercise Prescription Increase Physical Activity;Increase Strength and Stamina;Understanding of Exercise Prescription Understanding of Exercise Prescription;Increase Strength and Stamina;Increase Physical Activity    Comments Carvin is doing well in rehab.  He is up to level 3 on NuStep.  We will encourage him to increase other machines consistently and continue to monitor his progress. Esgar is doing well in rehab. He has improved to level 2 on the biostep and continues to work at level 3 on the T4 nustep. He also increased his overall average MET level to 2.76 METs. He has continued to do well with 3 lb hand weights for resistance training as well. We will continue to monitor his progress in the program. Ronaldo continues to do well in rehab. He has stayed consistent with his workloads on all of his seated machines and has not done any walking in rehab. He continues to do well with 3 lb  hand weights for resistance training as well. We will continue to monitor his progress in the program. Cyndie Chime remains consistent with his workloads. He has not increased any yet.  will continue to monitor his progress and see if he can increase his workloads to continue working on strength and stamina.    Expected Outcomes Short: Increase workload on BioStep and try 4 lb weights Long: Continue to improve stamina Short: Continue to progressively increase workloads and try 4 lb hand weights. Long: Continue to improve strength and stamina. Short: Try level 2 on the recumbent bike, and level 3 on the biostep. Long: Continue to improve strength and stamina. Short: Try level 2 on the recumbent bike, and level 3 on the biostep. Long: Continue to follow exercise progression to  improve strength and stamina.             Discharge Exercise Prescription (Final Exercise Prescription Changes):  Exercise Prescription Changes - 07/04/23 1600       Response to Exercise   Blood Pressure (Admit) 132/68    Blood Pressure (Exit) 104/60    Heart Rate (Admit) 192 bpm    Heart Rate (Exercise) 91 bpm    Heart Rate (Exit) 70 bpm    Oxygen Saturation (Admit) 95 %    Oxygen Saturation (Exercise) 96 %    Oxygen Saturation (Exit) 97 %    Rating of Perceived Exertion (Exercise) 13    Perceived Dyspnea (Exercise) 3    Symptoms SOB    Duration Continue with 30 min of aerobic exercise without signs/symptoms of physical distress.    Intensity THRR unchanged      Progression   Progression Continue to progress workloads to maintain intensity without signs/symptoms of physical distress.    Average METs 2.25      Resistance Training   Training Prescription Yes    Weight 3 lb    Reps 10-15      Interval Training   Interval Training No  NuStep   Level 3    Minutes 15      REL-XR   Level 1    Minutes 15    METs 1.9      T5 Nustep   Level 1    Minutes 15    METs 2.1      Biostep-RELP   Level 2     Minutes 15    METs 3      Home Exercise Plan   Plans to continue exercise at Home (comment)   Elcipse Machine   Frequency Add 2 additional days to program exercise sessions.    Initial Home Exercises Provided 05/04/23      Oxygen   Maintain Oxygen Saturation 88% or higher             Nutrition:  Target Goals: Understanding of nutrition guidelines, daily intake of sodium 1500mg , cholesterol 200mg , calories 30% from fat and 7% or less from saturated fats, daily to have 5 or more servings of fruits and vegetables.  Education: All About Nutrition: -Group instruction provided by verbal, written material, interactive activities, discussions, models, and posters to present general guidelines for heart healthy nutrition including fat, fiber, MyPlate, the role of sodium in heart healthy nutrition, utilization of the nutrition label, and utilization of this knowledge for meal planning. Follow up email sent as well. Written material given at graduation. Flowsheet Row Pulmonary Rehab from 05/11/2023 in [facility] Cardiac and Pulmonary Rehab  Date --  [part 2]       Biometrics:  Pre Biometrics - 04/06/23 1009       Pre Biometrics   Waist Circumference 32 inches    Hip Circumference 43 inches    Waist to Hip Ratio 0.74 %              Nutrition Therapy Plan and Nutrition Goals:  Nutrition Therapy & Goals - 05/04/23 1042       Nutrition Therapy   RD appointment deferred Yes             Nutrition Assessments:  MEDIFICTS Score Key: ?70 Need to make dietary changes  40-70 Heart Healthy Diet ? 40 Therapeutic Level Cholesterol Diet  Flowsheet Row Pulmonary Rehab from 04/04/2023 in [facility] Cardiac and Pulmonary Rehab  Picture Your Plate Total Score on Admission 55      Picture Your Plate Scores: <16 Unhealthy dietary pattern with much room for improvement. 41-50 Dietary pattern unlikely to meet recommendations for good health and room for improvement. 51-60 More healthful  dietary pattern, with some room for improvement.  >60 Healthy dietary pattern, although there may be some specific behaviors that could be improved.   Nutrition Goals Re-Evaluation:  Nutrition Goals Re-Evaluation     Row Name 05/04/23 1041 05/27/23 1031           Goals   Current Weight -- 208 lb (94.3 kg)      Nutrition Goal -- Eat smaller meals      Comment Krzysztof's family does most of the cooking for he and his bed bound wife. He mentions he eats well and has no nutrition concerns. He does not want to meet with the dietician at this time. He states his appetite is good and he feels like his needs are being met Patient was informed on why it is important to maintain a balanced diet when dealing with Respiratory issues. Explained that it takes a lot of energy to breath and when they are short of breath often they will  need to have a good diet to help keep up with the calories they are expending for breathing.      Expected Outcome Short: continue maitenance with diet  Long: continue to follow a healthy pulmonary diet. Short: Choose and plan snacks accordingly to patients caloric intake to improve breathing. Long: Maintain a diet independently that meets their caloric intake to aid in daily shortness of breath.               Nutrition Goals Discharge (Final Nutrition Goals Re-Evaluation):  Nutrition Goals Re-Evaluation - 05/27/23 1031       Goals   Current Weight 208 lb (94.3 kg)    Nutrition Goal Eat smaller meals    Comment Patient was informed on why it is important to maintain a balanced diet when dealing with Respiratory issues. Explained that it takes a lot of energy to breath and when they are short of breath often they will need to have a good diet to help keep up with the calories they are expending for breathing.    Expected Outcome Short: Choose and plan snacks accordingly to patients caloric intake to improve breathing. Long: Maintain a diet independently that meets their  caloric intake to aid in daily shortness of breath.             Psychosocial: Target Goals: Acknowledge presence or absence of significant depression and/or stress, maximize coping skills, provide positive support system. Participant is able to verbalize types and ability to use techniques and skills needed for reducing stress and depression.   Education: Stress, Anxiety, and Depression - Group verbal and visual presentation to define topics covered.  Reviews how body is impacted by stress, anxiety, and depression.  Also discusses healthy ways to reduce stress and to treat/manage anxiety and depression.  Written material given at graduation.   Education: Sleep Hygiene -Provides group verbal and written instruction about how sleep can affect your health.  Define sleep hygiene, discuss sleep cycles and impact of sleep habits. Review good sleep hygiene tips.    Initial Review & Psychosocial Screening:  Initial Psych Review & Screening - 03/21/23 1556       Initial Review   Current issues with None Identified      Family Dynamics   Good Support System? Yes   daughter, Rosey Bath     Barriers   Psychosocial barriers to participate in program There are no identifiable barriers or psychosocial needs.      Screening Interventions   Interventions Encouraged to exercise;To provide support and resources with identified psychosocial needs;Provide feedback about the scores to participant    Expected Outcomes Short Term goal: Utilizing psychosocial counselor, staff and physician to assist with identification of specific Stressors or current issues interfering with healing process. Setting desired goal for each stressor or current issue identified.;Long Term Goal: Stressors or current issues are controlled or eliminated.;Short Term goal: Identification and review with participant of any Quality of Life or Depression concerns found by scoring the questionnaire.;Long Term goal: The participant improves  quality of Life and PHQ9 Scores as seen by post scores and/or verbalization of changes             Quality of Life Scores:  Scores of 19 and below usually indicate a poorer quality of life in these areas.  A difference of  2-3 points is a clinically meaningful difference.  A difference of 2-3 points in the total score of the Quality of Life Index has been associated with significant  improvement in overall quality of life, self-image, physical symptoms, and general health in studies assessing change in quality of life.  PHQ-9: Review Flowsheet  More data exists      04/04/2023 01/24/2023 10/26/2022 10/21/2021 11/13/2020  Depression screen PHQ 2/9  Decreased Interest 0 0 0 0 0  Down, Depressed, Hopeless 0 1 0 0 0  PHQ - 2 Score 0 1 0 0 0  Altered sleeping 0 2 0 0 -  Tired, decreased energy 3 3 3 1  -  Change in appetite 1 1 0 0 -  Feeling bad or failure about yourself  0 0 0 0 -  Trouble concentrating 0 1 0 0 -  Moving slowly or fidgety/restless 0 0 0 0 -  Suicidal thoughts 0 0 0 0 -  PHQ-9 Score 4 8 3 1  -  Difficult doing work/chores Not difficult at all Not difficult at all Not difficult at all Not difficult at all -   Interpretation of Total Score  Total Score Depression Severity:  1-4 = Minimal depression, 5-9 = Mild depression, 10-14 = Moderate depression, 15-19 = Moderately severe depression, 20-27 = Severe depression   Psychosocial Evaluation and Intervention:  Psychosocial Evaluation - 03/21/23 1608       Psychosocial Evaluation & Interventions   Interventions Encouraged to exercise with the program and follow exercise prescription    Comments Esco has no barriers to attending the program. He wants to leave the program breathing better. He has support from his daughter Rosey Bath. He has the desire to breath easier . He is ready to start the program    Expected Outcomes STG Rami is able to attend all scheduled sessions, he is able to progress with his exercise prescription  and work on breathing techniques to improve his day to day breathing with his daily activities.  LTG Jiyan continues to work on his exerise ad breathing techniques    Continue Psychosocial Services  Follow up required by staff             Psychosocial Re-Evaluation:  Psychosocial Re-Evaluation     Row Name 05/04/23 1027 05/27/23 1032           Psychosocial Re-Evaluation   Current issues with Current Stress Concerns;Current Sleep Concerns Current Stress Concerns      Comments Benjimin reports his biggest stressor is managing his wife's care with Alzheimers. She has had it for a few years and is now bed bound. He has his daughter and aides helping him out. They help with shopping and some chores as well as stay the night with his wife. He worries about his wife and mentions he knows he needs to take care of himself as well. He reports he thinks he sleeps very well, however his doctor is sending him for a sleep study. They are a few months out on the schedule, so he doesn't know when he will go back for the actual study. He stays busy with his doctors appointments and caring for his wife. He does know that this program is good for him because it is making him take time for himself. Patient reports no changes with their current mental states, sleep, stress, depression or anxiety. Will follow up with patient in a few weeks for any changes.      Expected Outcomes Short: attend pulmonary rehab consistently for exercise and education on breathing techniques and lifestyle changes. Long: independently maintain positive self care habits. Short: Continue to exercise regularly to support mental health and  notify staff of any changes. Long: maintain mental health and well being through teaching of rehab or prescribed medications independently.      Interventions Stress management education Encouraged to attend Pulmonary Rehabilitation for the exercise      Continue Psychosocial Services  Follow up required by  staff Follow up required by staff               Psychosocial Discharge (Final Psychosocial Re-Evaluation):  Psychosocial Re-Evaluation - 05/27/23 1032       Psychosocial Re-Evaluation   Current issues with Current Stress Concerns    Comments Patient reports no changes with their current mental states, sleep, stress, depression or anxiety. Will follow up with patient in a few weeks for any changes.    Expected Outcomes Short: Continue to exercise regularly to support mental health and notify staff of any changes. Long: maintain mental health and well being through teaching of rehab or prescribed medications independently.    Interventions Encouraged to attend Pulmonary Rehabilitation for the exercise    Continue Psychosocial Services  Follow up required by staff             Education: Education Goals: Education classes will be provided on a weekly basis, covering required topics. Participant will state understanding/return demonstration of topics presented.  Learning Barriers/Preferences:   General Pulmonary Education Topics:  Infection Prevention: - Provides verbal and written material to individual with discussion of infection control including proper hand washing and proper equipment cleaning during exercise session. Flowsheet Row Pulmonary Rehab from 05/11/2023 in [facility] Cardiac and Pulmonary Rehab  Date 04/04/23  Educator [facility]  Instruction Review Code 1- Verbalizes Understanding       Falls Prevention: - Provides verbal and written material to individual with discussion of falls prevention and safety. Flowsheet Row Pulmonary Rehab from 05/11/2023 in [facility] Cardiac and Pulmonary Rehab  Date 03/21/23  Educator SB  Instruction Review Code 1- Verbalizes Understanding       Chronic Lung Disease Review: - Group verbal instruction with posters, models, PowerPoint presentations and videos,  to review new updates, new respiratory medications, new advancements in procedures  and treatments. Providing information on websites and "800" numbers for continued self-education. Includes information about supplement oxygen, available portable oxygen systems, continuous and intermittent flow rates, oxygen safety, concentrators, and Medicare reimbursement for oxygen. Explanation of Pulmonary Drugs, including class, frequency, complications, importance of spacers, rinsing mouth after steroid MDI's, and proper cleaning methods for nebulizers. Review of basic lung anatomy and physiology related to function, structure, and complications of lung disease. Review of risk factors. Discussion about methods for diagnosing sleep apnea and types of masks and machines for OSA. Includes a review of the use of types of environmental controls: home humidity, furnaces, filters, dust mite/pet prevention, HEPA vacuums. Discussion about weather changes, air quality and the benefits of nasal washing. Instruction on Warning signs, infection symptoms, calling MD promptly, preventive modes, and value of vaccinations. Review of effective airway clearance, coughing and/or vibration techniques. Emphasizing that all should Create an Action Plan. Written material given at graduation. Flowsheet Row Pulmonary Rehab from 05/11/2023 in [facility] Cardiac and Pulmonary Rehab  Education need identified 04/04/23  Date 05/11/23  Educator [facility]  Instruction Review Code 1- Verbalizes Understanding       AED/CPR: - Group verbal and written instruction with the use of models to demonstrate the basic use of the AED with the basic ABC's of resuscitation.    Anatomy and Cardiac Procedures: - Group verbal and visual presentation  and models provide information about basic cardiac anatomy and function. Reviews the testing methods done to diagnose heart disease and the outcomes of the test results. Describes the treatment choices: Medical Management, Angioplasty, or Coronary Bypass Surgery for treating various heart conditions including  Myocardial Infarction, Angina, Valve Disease, and Cardiac Arrhythmias.  Written material given at graduation.   Medication Safety: - Group verbal and visual instruction to review commonly prescribed medications for heart and lung disease. Reviews the medication, class of the drug, and side effects. Includes the steps to properly store meds and maintain the prescription regimen.  Written material given at graduation.   Other: -Provides group and verbal instruction on various topics (see comments)   Knowledge Questionnaire Score:  Knowledge Questionnaire Score - 04/04/23 1541       Knowledge Questionnaire Score   Pre Score 16/18              Core Components/Risk Factors/Patient Goals at Admission:  Personal Goals and Risk Factors at Admission - 04/04/23 1541       Core Components/Risk Factors/Patient Goals on Admission    Weight Management Yes;Weight Loss    Intervention Weight Management: Develop a combined nutrition and exercise program designed to reach desired caloric intake, while maintaining appropriate intake of nutrient and fiber, sodium and fats, and appropriate energy expenditure required for the weight goal.;Weight Management: Provide education and appropriate resources to help participant work on and attain dietary goals.;Weight Management/Obesity: Establish reasonable short term and long term weight goals.    Admit Weight 210 lb 1.6 oz (95.3 kg)    Goal Weight: Short Term 205 lb (93 kg)    Goal Weight: Long Term 200 lb (90.7 kg)    Expected Outcomes Short Term: Continue to assess and modify interventions until short term weight is achieved;Long Term: Adherence to nutrition and physical activity/exercise program aimed toward attainment of established weight goal;Weight Loss: Understanding of general recommendations for a balanced deficit meal plan, which promotes 1-2 lb weight loss per week and includes a negative energy balance of [phone] kcal/d;Understanding  recommendations for meals to include 15-35% energy as protein, 25-35% energy from fat, 35-60% energy from carbohydrates, less than 200mg  of dietary cholesterol, 20-35 gm of total fiber daily;Understanding of distribution of calorie intake throughout the day with the consumption of 4-5 meals/snacks    Improve shortness of breath with ADL's Yes    Intervention Provide education, individualized exercise plan and daily activity instruction to help decrease symptoms of SOB with activities of daily living.    Expected Outcomes Short Term: Improve cardiorespiratory fitness to achieve a reduction of symptoms when performing ADLs;Long Term: Be able to perform more ADLs without symptoms or delay the onset of symptoms    Increase knowledge of respiratory medications and ability to use respiratory devices properly  Yes    Intervention Provide education and demonstration as needed of appropriate use of medications, inhalers, and oxygen therapy.    Expected Outcomes Short Term: Achieves understanding of medications use. Understands that oxygen is a medication prescribed by physician. Demonstrates appropriate use of inhaler and oxygen therapy.;Long Term: Maintain appropriate use of medications, inhalers, and oxygen therapy.    Hypertension Yes    Intervention Provide education on lifestyle modifcations including regular physical activity/exercise, weight management, moderate sodium restriction and increased consumption of fresh fruit, vegetables, and low fat dairy, alcohol moderation, and smoking cessation.;Monitor prescription use compliance.    Expected Outcomes Short Term: Continued assessment and intervention until BP is < 140/101mm HG in  hypertensive participants. < 130/80mm HG in hypertensive participants with diabetes, heart failure or chronic kidney disease.;Long Term: Maintenance of blood pressure at goal levels.    Lipids Yes    Intervention Provide education and support for participant on nutrition &  aerobic/resistive exercise along with prescribed medications to achieve LDL 70mg , HDL >40mg .    Expected Outcomes Short Term: Participant states understanding of desired cholesterol values and is compliant with medications prescribed. Participant is following exercise prescription and nutrition guidelines.;Long Term: Cholesterol controlled with medications as prescribed, with individualized exercise RX and with personalized nutrition plan. Value goals: LDL < 70mg , HDL > 40 mg.             Education:Diabetes - Individual verbal and written instruction to review signs/symptoms of diabetes, desired ranges of glucose level fasting, after meals and with exercise. Acknowledge that pre and post exercise glucose checks will be done for 3 sessions at entry of program.   Know Your Numbers and Heart Failure: - Group verbal and visual instruction to discuss disease risk factors for cardiac and pulmonary disease and treatment options.  Reviews associated critical values for Overweight/Obesity, Hypertension, Cholesterol, and Diabetes.  Discusses basics of heart failure: signs/symptoms and treatments.  Introduces Heart Failure Zone chart for action plan for heart failure.  Written material given at graduation.   Core Components/Risk Factors/Patient Goals Review:   Goals and Risk Factor Review     Row Name 05/04/23 1021 05/27/23 1030           Core Components/Risk Factors/Patient Goals Review   Personal Goals Review Improve shortness of breath with ADL's;Hypertension Improve shortness of breath with ADL's      Review Aaris reports feeling at his baseline. He hasn't seen any improvemenet since starting the program, but he is motivated to continue coming and is optimistic to see what his progress will be. His blood pressure has been stable so far in the program. He does not check it regularly at home. He does have a pulse ox which he uses sometimes. He has a lot of help at home with shopping and chores,  but he does note that he has a hard time doing some things around the house because of his breathing. He tries to remember to do his PLB and rest as needed. He had a knee replacement during COVID and couldn't go to rehab, so he has become more sedentary which he states that his breathing has gotten worse since then. He is trying to be more active by coming to the program. Spoke to patient about their shortness of breath and what they can do to improve. Patient has been informed of breathing techniques when starting the program. Patient is informed to tell staff if they have had any med changes and that certain meds they are taking or not taking can be causing shortness of breath.      Expected Outcomes Short: attend pulmonary rehab to work on his shortness of breath with ADLs. Long: independently manage breathing techniques Short: Attend [facility] regularly to improve shortness of breath with ADL's. Long: maintain independence with ADL's               Core Components/Risk Factors/Patient Goals at Discharge (Final Review):   Goals and Risk Factor Review - 05/27/23 1030       Core Components/Risk Factors/Patient Goals Review   Personal Goals Review Improve shortness of breath with ADL's    Review Spoke to patient about their shortness of breath and what they  can do to improve. Patient has been informed of breathing techniques when starting the program. Patient is informed to tell staff if they have had any med changes and that certain meds they are taking or not taking can be causing shortness of breath.    Expected Outcomes Short: Attend [facility] regularly to improve shortness of breath with ADL's. Long: maintain independence with ADL's             ITP Comments:  ITP Comments     Row Name 03/21/23 1614 04/04/23 1533 04/06/23 1002 04/13/23 1326 05/11/23 0919   ITP Comments Virtual orientation call completed today. he has an appointment on Date: 04/04/2023  for EP eval and gym Orientation.   Documentation of diagnosis can be found in [facility] Date: [id number] . Completed and gym orientation. Initial ITP created and sent for review to Dr. Jinny Sanders, Medical Director. First full day of exercise!  Patient was oriented to gym and equipment including functions, settings, policies, and procedures.  Patient's individual exercise prescription and treatment plan were reviewed.  All starting workloads were established based on the results of the 6 minute walk test done at initial orientation visit.  The plan for exercise progression was also introduced and progression will be customized based on patient's performance and goals. 30 day review completed. ITP sent to Dr. Jinny Sanders, Medical Director of  Pulmonary Rehab. Continue with ITP unless changes are made by physician. 30 Day review completed. Medical Director ITP review done, changes made as directed, and signed approval by Medical Director.    Row Name 06/08/23 1103 07/05/23 1440         ITP Comments 30 Day review completed. Medical Director ITP review done, changes made as directed, and signed approval by Medical Director. 30 Day review completed. Medical Director ITP review done, changes made as directed, and signed approval by Medical Director.               Comments:

## 2023-07-06 ENCOUNTER — Encounter: Payer: Medicare HMO | Admitting: *Deleted

## 2023-07-06 DIAGNOSIS — J449 Chronic obstructive pulmonary disease, unspecified: Secondary | ICD-10-CM | POA: Diagnosis not present

## 2023-07-06 DIAGNOSIS — J986 Disorders of diaphragm: Secondary | ICD-10-CM

## 2023-07-06 NOTE — Progress Notes (Signed)
Daily Session Note  Patient Details  Name: Luian Schumpert MRN: [id number] Date of Birth: [birth year] Referring Provider:   Flowsheet Row Pulmonary Rehab from 04/04/2023 in [facility] Cardiac and Pulmonary Rehab  Referring Provider Mila Merry MD       Encounter Date: 07/06/2023  Check In:  Session Check In - 07/06/23 1005       Check-In   Supervising physician immediately available to respond to emergencies See telemetry face sheet for immediately available ER MD    Location [facility]-Cardiac & Pulmonary Rehab    Staff Present Lanny Hurst, RN, ADN;Kristen Coble, RN,BC,MSN;Krista Karleen Hampshire RN, BSN    Virtual Visit No    Medication changes reported     No    Fall or balance concerns reported    No    Warm-up and Cool-down Performed on first and last piece of equipment    Resistance Training Performed Yes    VAD Patient? No    PAD/SET Patient? No      Pain Assessment   Currently in Pain? No/denies                Social History   Tobacco Use  Smoking Status Former   Packs/day: 0.25   Years: 6.00   Additional pack years: 0.00   Total pack years: 1.50   Types: Cigarettes   Quit date: 11/13/1969   Years since quitting: 53.6  Smokeless Tobacco Never  Tobacco Comments   quit in 1970's    Goals Met:  Independence with exercise equipment Exercise tolerated well No report of concerns or symptoms today Strength training completed today  Goals Unmet:  Not Applicable  Comments: Pt able to follow exercise prescription today without complaint.  Will continue to monitor for progression.    Dr. Bethann Punches is Medical Director for [facility] Cardiac Rehabilitation.  Dr. Vida Rigger is Medical Director for [facility] Pulmonary Rehabilitation.

## 2023-07-08 DIAGNOSIS — H401133 Primary open-angle glaucoma, bilateral, severe stage: Secondary | ICD-10-CM | POA: Diagnosis not present

## 2023-07-13 ENCOUNTER — Encounter: Payer: Medicare HMO | Admitting: *Deleted

## 2023-07-13 DIAGNOSIS — J986 Disorders of diaphragm: Secondary | ICD-10-CM

## 2023-07-13 DIAGNOSIS — J449 Chronic obstructive pulmonary disease, unspecified: Secondary | ICD-10-CM | POA: Diagnosis not present

## 2023-07-13 NOTE — Progress Notes (Signed)
Daily Session Note  Patient Details  Name: Daniel Carr MRN: [id number] Date of Birth: [birth year] Referring Provider:   Flowsheet Row Pulmonary Rehab from 04/04/2023 in [facility] Cardiac and Pulmonary Rehab  Referring Provider Mila Merry MD       Encounter Date: 07/13/2023  Check In:  Session Check In - 07/13/23 1123       Check-In   Supervising physician immediately available to respond to emergencies See telemetry face sheet for immediately available ER MD    Location [facility]-Cardiac & Pulmonary Rehab    Staff Present Lanny Hurst, RN, ADN;Joseph Reino Kent, RCP,RRT,BSRT;Meredith Jewel Baize, RN BSN;Denise Rhew, PhD, RN, CNS, CEN    Virtual Visit No    Medication changes reported     No    Fall or balance concerns reported    No    Warm-up and Cool-down Performed on first and last piece of equipment    Resistance Training Performed Yes    VAD Patient? No    PAD/SET Patient? No      Pain Assessment   Currently in Pain? No/denies                Social History   Tobacco Use  Smoking Status Former   Current packs/day: 0.00   Average packs/day: 0.3 packs/day for 6.0 years (1.5 ttl pk-yrs)   Types: Cigarettes   Start date: 11/14/1963   Quit date: 11/13/1969   Years since quitting: 53.6  Smokeless Tobacco Never  Tobacco Comments   quit in 1970's    Goals Met:  Independence with exercise equipment Exercise tolerated well No report of concerns or symptoms today Strength training completed today  Goals Unmet:  Not Applicable  Comments: Pt able to follow exercise prescription today without complaint.  Will continue to monitor for progression.    Dr. Bethann Punches is Medical Director for [facility] Cardiac Rehabilitation.  Dr. Vida Rigger is Medical Director for [facility] Pulmonary Rehabilitation.

## 2023-07-18 ENCOUNTER — Encounter: Payer: Medicare HMO | Admitting: *Deleted

## 2023-07-18 DIAGNOSIS — J986 Disorders of diaphragm: Secondary | ICD-10-CM | POA: Diagnosis not present

## 2023-07-18 DIAGNOSIS — J449 Chronic obstructive pulmonary disease, unspecified: Secondary | ICD-10-CM

## 2023-07-18 NOTE — Progress Notes (Signed)
Daily Session Note  Patient Details  Name: Marvie Calender MRN: [id number] Date of Birth: 10-12-1935 Referring Provider:   Flowsheet Row Pulmonary Rehab from 04/04/2023 in [facility] Cardiac and Pulmonary Rehab  Referring Provider Mila Merry MD       Encounter Date: 07/18/2023  Check In:  Session Check In - 07/18/23 1048       Check-In   Supervising physician immediately available to respond to emergencies See telemetry face sheet for immediately available ER MD    Location [facility]-Cardiac & Pulmonary Rehab    Staff Present Lanny Hurst, RN, ADN;Joseph Reino Kent, RCP,RRT,BSRT;Meredith Jewel Baize, RN BSN    Virtual Visit No    Medication changes reported     No    Fall or balance concerns reported    No    Warm-up and Cool-down Performed on first and last piece of equipment    Resistance Training Performed Yes    VAD Patient? No    PAD/SET Patient? No      Pain Assessment   Currently in Pain? No/denies                Social History   Tobacco Use  Smoking Status Former   Current packs/day: 0.00   Average packs/day: 0.3 packs/day for 6.0 years (1.5 ttl pk-yrs)   Types: Cigarettes   Start date: 11/14/1963   Quit date: 11/13/1969   Years since quitting: 53.7  Smokeless Tobacco Never  Tobacco Comments   quit in 1970's    Goals Met:  Independence with exercise equipment Exercise tolerated well No report of concerns or symptoms today Strength training completed today  Goals Unmet:  Not Applicable  Comments: Pt able to follow exercise prescription today without complaint.  Will continue to monitor for progression.    Dr. Bethann Punches is Medical Director for [facility] Cardiac Rehabilitation.  Dr. Vida Rigger is Medical Director for [facility] Pulmonary Rehabilitation.

## 2023-07-20 ENCOUNTER — Encounter: Payer: Medicare HMO | Admitting: *Deleted

## 2023-07-20 DIAGNOSIS — J449 Chronic obstructive pulmonary disease, unspecified: Secondary | ICD-10-CM

## 2023-07-20 DIAGNOSIS — J986 Disorders of diaphragm: Secondary | ICD-10-CM

## 2023-07-20 NOTE — Progress Notes (Signed)
Daily Session Note  Patient Details  Name: Daniel Carr MRN: [id number] Date of Birth: 1935-07-21 Referring Provider:   Flowsheet Row Pulmonary Rehab from 04/04/2023 in [facility] Cardiac and Pulmonary Rehab  Referring Provider Mila Merry MD       Encounter Date: 07/20/2023  Check In:  Session Check In - 07/20/23 1013       Check-In   Supervising physician immediately available to respond to emergencies See telemetry face sheet for immediately available ER MD    Location [facility]-Cardiac & Pulmonary Rehab    Staff Present Lanny Hurst, RN, ADN;Susanne Bice, RN, BSN, CCRP;Other   Rory Percy, MS   Virtual Visit No    Medication changes reported     No    Fall or balance concerns reported    No    Warm-up and Cool-down Performed on first and last piece of equipment    Resistance Training Performed Yes    VAD Patient? No    PAD/SET Patient? No      Pain Assessment   Currently in Pain? No/denies                Social History   Tobacco Use  Smoking Status Former   Current packs/day: 0.00   Average packs/day: 0.3 packs/day for 6.0 years (1.5 ttl pk-yrs)   Types: Cigarettes   Start date: 11/14/1963   Quit date: 11/13/1969   Years since quitting: 53.7  Smokeless Tobacco Never  Tobacco Comments   quit in 1970's    Goals Met:  Independence with exercise equipment Exercise tolerated well No report of concerns or symptoms today Strength training completed today  Goals Unmet:  Not Applicable  Comments: Pt able to follow exercise prescription today without complaint.  Will continue to monitor for progression.    Dr. Bethann Punches is Medical Director for [facility] Cardiac Rehabilitation.  Dr. Vida Rigger is Medical Director for [facility] Pulmonary Rehabilitation.

## 2023-07-27 ENCOUNTER — Encounter: Payer: Medicare HMO | Admitting: *Deleted

## 2023-07-27 DIAGNOSIS — J449 Chronic obstructive pulmonary disease, unspecified: Secondary | ICD-10-CM

## 2023-07-27 DIAGNOSIS — J986 Disorders of diaphragm: Secondary | ICD-10-CM

## 2023-07-27 NOTE — Progress Notes (Signed)
Daily Session Note  Patient Details  Name: Daniel Carr MRN: [id number] Date of Birth: [birth year] Referring Provider:   Flowsheet Row Pulmonary Rehab from 04/04/2023 in [facility] Cardiac and Pulmonary Rehab  Referring Provider Mila Merry MD       Encounter Date: 07/27/2023  Check In:  Session Check In - 07/27/23 0952       Check-In   Supervising physician immediately available to respond to emergencies See telemetry face sheet for immediately available ER MD    Location [facility]-Cardiac & Pulmonary Rehab    Staff Present Lanny Hurst, RN, ADN;Joseph Reino Kent, RCP,RRT,BSRT;Other   Girtha Rm, MS   Virtual Visit No    Medication changes reported     No    Fall or balance concerns reported    No    Warm-up and Cool-down Performed on first and last piece of equipment    Resistance Training Performed Yes    VAD Patient? No    PAD/SET Patient? No      Pain Assessment   Currently in Pain? No/denies                Social History   Tobacco Use  Smoking Status Former   Current packs/day: 0.00   Average packs/day: 0.3 packs/day for 6.0 years (1.5 ttl pk-yrs)   Types: Cigarettes   Start date: 11/14/1963   Quit date: 11/13/1969   Years since quitting: 53.7  Smokeless Tobacco Never  Tobacco Comments   quit in 1970's    Goals Met:  Independence with exercise equipment Exercise tolerated well No report of concerns or symptoms today Strength training completed today  Goals Unmet:  Not Applicable  Comments: Pt able to follow exercise prescription today without complaint.  Will continue to monitor for progression.     Dr. Bethann Punches is Medical Director for [facility] Cardiac Rehabilitation.  Dr. Vida Rigger is Medical Director for [facility] Pulmonary Rehabilitation.

## 2023-07-29 ENCOUNTER — Encounter: Payer: Medicare HMO | Attending: Family Medicine | Admitting: *Deleted

## 2023-07-29 DIAGNOSIS — J449 Chronic obstructive pulmonary disease, unspecified: Secondary | ICD-10-CM

## 2023-07-29 DIAGNOSIS — Z5189 Encounter for other specified aftercare: Secondary | ICD-10-CM | POA: Diagnosis not present

## 2023-07-29 DIAGNOSIS — Z87891 Personal history of nicotine dependence: Secondary | ICD-10-CM | POA: Insufficient documentation

## 2023-07-29 DIAGNOSIS — J986 Disorders of diaphragm: Secondary | ICD-10-CM

## 2023-07-29 NOTE — Progress Notes (Signed)
Daily Session Note  Patient Details  Name: Daniel Carr MRN: [id number] Date of Birth: [birth year] Referring Provider:   Flowsheet Row Pulmonary Rehab from 04/04/2023 in [facility] Cardiac and Pulmonary Rehab  Referring Provider Mila Merry MD       Encounter Date: 07/29/2023  Check In:  Session Check In - 07/29/23 1146       Check-In   Supervising physician immediately available to respond to emergencies See telemetry face sheet for immediately available ER MD    Staff Present Cora Collum, RN, BSN, CCRP;Joseph Hood, RCP,RRT,BSRT;Noah Tickle, [place], Exercise Physiologist    Virtual Visit No    Medication changes reported     No    Fall or balance concerns reported    No    Warm-up and Cool-down Performed on first and last piece of equipment    Resistance Training Performed Yes    VAD Patient? No    PAD/SET Patient? No                Social History   Tobacco Use  Smoking Status Former   Current packs/day: 0.00   Average packs/day: 0.3 packs/day for 6.0 years (1.5 ttl pk-yrs)   Types: Cigarettes   Start date: 11/14/1963   Quit date: 11/13/1969   Years since quitting: 53.7  Smokeless Tobacco Never  Tobacco Comments   quit in 1970's    Goals Met:  Proper associated with RPD/PD & O2 Sat Independence with exercise equipment Exercise tolerated well No report of concerns or symptoms today  Goals Unmet:  Not Applicable  Comments: Pt able to follow exercise prescription today without complaint.  Will continue to monitor for progression.    Dr. Bethann Punches is Medical Director for [facility] Cardiac Rehabilitation.  Dr. Vida Rigger is Medical Director for [facility] Pulmonary Rehabilitation.

## 2023-08-03 ENCOUNTER — Encounter: Payer: Medicare HMO | Admitting: *Deleted

## 2023-08-03 ENCOUNTER — Encounter: Payer: Self-pay | Admitting: *Deleted

## 2023-08-03 DIAGNOSIS — J986 Disorders of diaphragm: Secondary | ICD-10-CM

## 2023-08-03 DIAGNOSIS — J449 Chronic obstructive pulmonary disease, unspecified: Secondary | ICD-10-CM

## 2023-08-03 DIAGNOSIS — Z5189 Encounter for other specified aftercare: Secondary | ICD-10-CM | POA: Diagnosis not present

## 2023-08-03 DIAGNOSIS — Z87891 Personal history of nicotine dependence: Secondary | ICD-10-CM | POA: Diagnosis not present

## 2023-08-03 NOTE — Progress Notes (Signed)
Pulmonary Individual Treatment Plan  Patient Details  Name: Life Simic MRN: [id number] Date of Birth: 09-15-1935 Referring Provider:   Flowsheet Row Pulmonary Rehab from 04/04/2023 in [facility] Cardiac and Pulmonary Rehab  Referring Provider Mila Merry MD       Initial Encounter Date:  Flowsheet Row Pulmonary Rehab from 04/04/2023 in [facility] Cardiac and Pulmonary Rehab  Date 04/04/23       Visit Diagnosis: Stage 3 severe COPD by GOLD classification (HCC)  Diaphragm paralysis  Patient's Home Medications on Admission:  Current Outpatient Medications:    amLODipine (NORVASC) 2.5 MG tablet, Take 1 tablet by mouth once daily, Disp: 90 tablet, Rfl: 4   aspirin 81 MG EC tablet, Take 1 tablet (81 mg total) by mouth daily. Swallow whole., Disp: , Rfl:    azelastine (ASTELIN) 0.1 % nasal spray, Place 1 spray into the nose daily as needed (allergies)., Disp: , Rfl:    brimonidine (ALPHAGAN) 0.2 % ophthalmic solution, Place 1 drop into both eyes in the morning and at bedtime., Disp: , Rfl:    clotrimazole-betamethasone (LOTRISONE) cream, APPLY TOPICALLY ONCE DAILY (Patient not taking: Reported on 03/21/2023), Disp: 15 g, Rfl: 0   COD LIVER OIL PO, Take 1 capsule by mouth daily. (Patient not taking: Reported on 03/21/2023), Disp: , Rfl:    dorzolamide (TRUSOPT) 2 % ophthalmic solution, Apply to eye., Disp: , Rfl:    dutasteride (AVODART) 0.5 MG capsule, Take 1 capsule by mouth once daily, Disp: 90 capsule, Rfl: 4   enalapril-hydrochlorothiazide (VASERETIC) 10-25 MG tablet, hydroCHLOROthiazide 25mg  (Patient not taking: Reported on 03/21/2023), Disp: , Rfl:    hydrochlorothiazide (HYDRODIURIL) 25 MG tablet, TAKE ONE-HALF TABLET BY MOUTH ONCE DAILY, Disp: 45 tablet, Rfl: 03   ipratropium (ATROVENT) 0.06 % nasal spray, Place 1 spray into both nostrils 2 (two) times daily as needed for rhinitis., Disp: , Rfl: 6   latanoprost (XALATAN) 0.005 % ophthalmic solution, SMARTSIG:In Eye(s), Disp: , Rfl:     levocetirizine (XYZAL) 5 MG tablet, Take 5 mg by mouth every evening., Disp: , Rfl:    levothyroxine (SYNTHROID) 150 MCG tablet, Take 1 tablet by mouth once daily, Disp: 90 tablet, Rfl: 4   lovastatin (MEVACOR) 40 MG tablet, Take 1 tablet by mouth once daily, Disp: 90 tablet, Rfl: 3   metoprolol tartrate (LOPRESSOR) 100 MG tablet, Take tablet TWO hours prior to his cardiac CT scan., Disp: 1 tablet, Rfl: 0   Multiple Vitamin (DAILY VITAMIN PO), Take 1 tablet by mouth daily., Disp: , Rfl:    naproxen (NAPROSYN) 500 MG tablet, Take 1 tablet (500 mg total) by mouth 2 (two) times daily with a meal. (Patient not taking: Reported on 03/21/2023), Disp: 30 tablet, Rfl: 1   predniSONE (DELTASONE) 5 MG tablet, Take by mouth. (Patient not taking: Reported on 04/29/2023), Disp: , Rfl:    traMADol (ULTRAM) 50 MG tablet, Take 50 mg by mouth every 6 (six) hours as needed., Disp: , Rfl:    vitamin B-12 (CYANOCOBALAMIN) 100 MCG tablet, Take 100 mcg by mouth daily. Unsure of dose  is OTC med, Disp: , Rfl:   Past Medical History: Past Medical History:  Diagnosis Date   Arthritis    childhood infection    chicken pox, measels, and mumps   Glaucoma    Hypertension    Hypothyroidism    Sinus problem    Ulnar neuropathy    bilateral    Tobacco Use: Social History   Tobacco Use  Smoking Status Former  Current packs/day: 0.00   Average packs/day: 0.3 packs/day for 6.0 years (1.5 ttl pk-yrs)   Types: Cigarettes   Start date: 11/14/1963   Quit date: 11/13/1969   Years since quitting: 53.7  Smokeless Tobacco Never  Tobacco Comments   quit in 1970's    Labs: Review Flowsheet  More data exists      Latest Ref Rng & Units 06/11/2019 07/22/2020 07/06/2021 03/29/2022 10/26/2022  Labs for ITP Cardiac and Pulmonary Rehab  Cholestrol 100 - 199 mg/dL 244  010  272  536  644   LDL (calc) 0 - 99 mg/dL 76  64  74  66  71   HDL-C >39 mg/dL 65  68  76  81  90   Trlycerides 0 - 149 mg/dL 76  89  61  60  57      Details             Pulmonary Assessment Scores:  Pulmonary Assessment Scores     Row Name 04/04/23 1543         ADL UCSD   ADL Phase Entry     SOB Score total 72     Rest 3     Walk 4     Stairs 4     Bath 2     Dress 2     Shop 3       CAT Score   CAT Score 17       mMRC Score   mMRC Score 4              UCSD: Self-administered rating of dyspnea associated with activities of daily living (ADLs) 6-point scale (0 = "not at all" to 5 = "maximal or unable to do because of breathlessness")  Scoring Scores range from 0 to 120.  Minimally important difference is 5 units  CAT: CAT can identify the health impairment of COPD patients and is better correlated with disease progression.  CAT has a scoring range of zero to 40. The CAT score is classified into four groups of low (less than 10), medium (10 - 20), high (21-30) and very high (31-40) based on the impact level of disease on health status. A CAT score over 10 suggests significant symptoms.  A worsening CAT score could be explained by an exacerbation, poor medication adherence, poor inhaler technique, or progression of COPD or comorbid conditions.  CAT MCID is 2 points  mMRC: mMRC (Modified Medical Research Council) Dyspnea Scale is used to assess the degree of baseline functional disability in patients of respiratory disease due to dyspnea. No minimal important difference is established. A decrease in score of 1 point or greater is considered a positive change.   Pulmonary Function Assessment:  Pulmonary Function Assessment - 04/04/23 1543       Pulmonary Function Tests   FVC% 2.28 %   test from 02/04/21   FEV1% 1.17 %    FEV1/FVC Ratio 51.52      Breath   Shortness of Breath Yes;Limiting activity             Exercise Target Goals: Exercise Program Goal: Individual exercise prescription set using results from initial 6 min walk test and THRR while considering  patient's activity barriers and safety.    Exercise Prescription Goal: Initial exercise prescription builds to 30-45 minutes a day of aerobic activity, 2-3 days per week.  Home exercise guidelines will be given to patient during program as part of exercise prescription that the participant  will acknowledge.  Education: Aerobic Exercise: - Group verbal and visual presentation on the components of exercise prescription. Introduces F.I.T.T principle from ACSM for exercise prescriptions.  Reviews F.I.T.T. principles of aerobic exercise including progression. Written material given at graduation.   Education: Resistance Exercise: - Group verbal and visual presentation on the components of exercise prescription. Introduces F.I.T.T principle from ACSM for exercise prescriptions  Reviews F.I.T.T. principles of resistance exercise including progression. Written material given at graduation.    Education: Exercise & Equipment Safety: - Individual verbal instruction and demonstration of equipment use and safety with use of the equipment. Flowsheet Row Pulmonary Rehab from 05/11/2023 in [facility] Cardiac and Pulmonary Rehab  Date 04/04/23  Educator [employer]  Instruction Review Code 1- Verbalizes Understanding       Education: Exercise Physiology & General Exercise Guidelines: - Group verbal and written instruction with models to review the exercise physiology of the cardiovascular system and associated critical values. Provides general exercise guidelines with specific guidelines to those with heart or lung disease.    Education: Flexibility, Balance, Mind/Body Relaxation: - Group verbal and visual presentation with interactive activity on the components of exercise prescription. Introduces F.I.T.T principle from ACSM for exercise prescriptions. Reviews F.I.T.T. principles of flexibility and balance exercise training including progression. Also discusses the mind body connection.  Reviews various relaxation techniques to help reduce and manage stress  (i.e. Deep breathing, progressive muscle relaxation, and visualization). Balance handout provided to take home. Written material given at graduation.   Activity Barriers & Risk Stratification:  Activity Barriers & Cardiac Risk Stratification - 04/04/23 1535       Activity Barriers & Cardiac Risk Stratification   Activity Barriers Joint Problems;Right Knee Replacement;Left Knee Replacement;Arthritis;Muscular Weakness;Deconditioning;Balance Concerns;Other (comment);Shortness of Breath;History of Falls;Assistive Device    Comments Arthritis all over causing deformities in hands, neuropathy in feet             6 Minute Walk:  6 Minute Walk     Row Name 04/04/23 1533         6 Minute Walk   Phase Initial     Distance 310 feet     Walk Time 5 minutes     # of Rest Breaks 1  1 minute rest     MPH 0.7     METS 0.51     RPE 13     Perceived Dyspnea  3     VO2 Peak 1.77     Symptoms Yes (comment)     Comments fatigue, SOB, chronic foot pain     Resting HR 75 bpm     Resting BP 134/66     Resting Oxygen Saturation  94 %     Exercise Oxygen Saturation  during 6 min walk 87 %     Max Ex. HR 102 bpm     Max Ex. BP 154/72     2 Minute Post BP 138/70       Interval HR   1 Minute HR 94     2 Minute HR 98     3 Minute HR 99     4 Minute HR 96     5 Minute HR 101     6 Minute HR 102     2 Minute Post HR 79     Interval Heart Rate? Yes       Interval Oxygen   Interval Oxygen? Yes     Baseline Oxygen Saturation % 94 %     1  Minute Oxygen Saturation % 89 %     1 Minute Liters of Oxygen 0 L  Room Air     2 Minute Oxygen Saturation % 87 %     2 Minute Liters of Oxygen 0 L     3 Minute Oxygen Saturation % 84 %     3 Minute Liters of Oxygen 0 L     4 Minute Oxygen Saturation % 88 %     4 Minute Liters of Oxygen 0 L     5 Minute Oxygen Saturation % 87 %     5 Minute Liters of Oxygen 0 L     6 Minute Oxygen Saturation % 89 %     6 Minute Liters of Oxygen 0 L     2 Minute Post  Oxygen Saturation % 98 %     2 Minute Post Liters of Oxygen 0 L             Oxygen Initial Assessment:  Oxygen Initial Assessment - 04/04/23 1542       Home Oxygen   Home Oxygen Device None    Sleep Oxygen Prescription None    Home Exercise Oxygen Prescription None    Home Resting Oxygen Prescription None    Compliance with Home Oxygen Use Yes      Initial 6 min Walk   Oxygen Used None      Program Oxygen Prescription   Program Oxygen Prescription None      Intervention   Short Term Goals To learn and demonstrate proper pursed lip breathing techniques or other breathing techniques. ;To learn and understand importance of maintaining oxygen saturations>88%;To learn and demonstrate proper use of respiratory medications;To learn and understand importance of monitoring SPO2 with pulse oximeter and demonstrate accurate use of the pulse oximeter.    Long  Term Goals Exhibits proper breathing techniques, such as pursed lip breathing or other method taught during program session;Compliance with respiratory medication;Demonstrates proper use of MDI's;Maintenance of O2 saturations>88%;Verbalizes importance of monitoring SPO2 with pulse oximeter and return demonstration             Oxygen Re-Evaluation:  Oxygen Re-Evaluation     Row Name 04/06/23 1004 05/04/23 1055 05/27/23 1027         Program Oxygen Prescription   Program Oxygen Prescription -- None None       Home Oxygen   Home Oxygen Device -- None None     Sleep Oxygen Prescription -- None None     Home Exercise Oxygen Prescription -- None None     Home Resting Oxygen Prescription -- None None     Compliance with Home Oxygen Use -- Yes Yes       Goals/Expected Outcomes   Short Term Goals -- To learn and demonstrate proper pursed lip breathing techniques or other breathing techniques. ;To learn and understand importance of maintaining oxygen saturations>88%;To learn and demonstrate proper use of respiratory medications;To  learn and understand importance of monitoring SPO2 with pulse oximeter and demonstrate accurate use of the pulse oximeter. To learn and understand importance of maintaining oxygen saturations>88%;To learn and understand importance of monitoring SPO2 with pulse oximeter and demonstrate accurate use of the pulse oximeter.     Long  Term Goals -- Exhibits proper breathing techniques, such as pursed lip breathing or other method taught during program session;Compliance with respiratory medication;Demonstrates proper use of MDI's;Maintenance of O2 saturations>88%;Verbalizes importance of monitoring SPO2 with pulse oximeter and return demonstration Verbalizes importance of monitoring  SPO2 with pulse oximeter and return demonstration;Maintenance of O2 saturations>88%     Comments Reviewed PLB technique with pt.  Talked about how it works and it's importance in maintaining their exercise saturations. Tesfa is trying to remember to use his PLB when needed. He takes breaks and sits as needed. He hasn't noticed a difference in his breathing since starting the program yet, but is optimistic. He uses his pulse ox sometimes. He is on the list for a sleep study but doesn't know when it will be since they are a few months behind on the schedule Has a pulse oximeter to check her/his oxygen saturation at home. Informed him where to get one and explained why it is important to have one. Reviewed that oxygen saturations should be 88 percent and above.     Goals/Expected Outcomes Short: Become more profiecient at using PLB. Long: Become independent at using PLB. Short: get his sleep study scheduled and practice his PLB. Long: independently manage his breathing techniques Short: monitor oxygen at home with exertion. Long: maintain oxygen saturations above 88 percent independently.              Oxygen Discharge (Final Oxygen Re-Evaluation):  Oxygen Re-Evaluation - 05/27/23 1027       Program Oxygen Prescription   Program  Oxygen Prescription None      Home Oxygen   Home Oxygen Device None    Sleep Oxygen Prescription None    Home Exercise Oxygen Prescription None    Home Resting Oxygen Prescription None    Compliance with Home Oxygen Use Yes      Goals/Expected Outcomes   Short Term Goals To learn and understand importance of maintaining oxygen saturations>88%;To learn and understand importance of monitoring SPO2 with pulse oximeter and demonstrate accurate use of the pulse oximeter.    Long  Term Goals Verbalizes importance of monitoring SPO2 with pulse oximeter and return demonstration;Maintenance of O2 saturations>88%    Comments Has a pulse oximeter to check her/his oxygen saturation at home. Informed him where to get one and explained why it is important to have one. Reviewed that oxygen saturations should be 88 percent and above.    Goals/Expected Outcomes Short: monitor oxygen at home with exertion. Long: maintain oxygen saturations above 88 percent independently.             Initial Exercise Prescription:  Initial Exercise Prescription - 04/04/23 1500       Date of Initial Exercise RX and Referring Provider   Date 04/04/23    Referring Provider Mila Merry MD      Oxygen   Maintain Oxygen Saturation 88% or higher      Recumbant Bike   Level 1    RPM 50    Watts 5    Minutes 15    METs 2      NuStep   Level 1    SPM 80    Minutes 15    METs 2      T5 Nustep   Level 1    SPM 80    Minutes 1    METs 2      Biostep-RELP   Level 1    SPM 50    Minutes 15    METs 2      Prescription Details   Frequency (times per week) 2    Duration Progress to 30 minutes of continuous aerobic without signs/symptoms of physical distress      Intensity   THRR 40-80%  of Max Heartrate 99-122    Ratings of Perceived Exertion 11-13    Perceived Dyspnea 0-4      Progression   Progression Continue to progress workloads to maintain intensity without signs/symptoms of physical distress.       Resistance Training   Training Prescription Yes    Weight 3 lb    Reps 10-15             Perform Capillary Blood Glucose checks as needed.  Exercise Prescription Changes:   Exercise Prescription Changes     Row Name 04/04/23 1500 04/11/23 1300 04/25/23 1400 05/04/23 1000 05/09/23 1400     Response to Exercise   Blood Pressure (Admit) 134/66 128/64 114/60 -- --   Blood Pressure (Exercise) 154/72 136/70 146/60 -- --   Blood Pressure (Exit) 138/70 122/60 128/62 -- --   Heart Rate (Admit) 75 bpm 90 bpm 78 bpm -- 70 bpm   Heart Rate (Exercise) 102 bpm 100 bpm 102 bpm -- 111 bpm   Heart Rate (Exit) 80 bpm 100 bpm 89 bpm -- 82 bpm   Oxygen Saturation (Admit) 94 % 90 % 95 % -- 92 %   Oxygen Saturation (Exercise) 87 % 90 % 89 % -- 88 %   Oxygen Saturation (Exit) 95 % 95 % 97 % -- 96 %   Rating of Perceived Exertion (Exercise) 13 13 15  -- 13   Perceived Dyspnea (Exercise) 3 3 3  -- 3   Symptoms SOB, chronic foot pain SOB SOB -- SOB   Comments walk test results First full day of exercise -- -- --   Duration -- Progress to 30 minutes of  aerobic without signs/symptoms of physical distress Progress to 30 minutes of  aerobic without signs/symptoms of physical distress -- Progress to 30 minutes of  aerobic without signs/symptoms of physical distress   Intensity -- THRR unchanged THRR unchanged -- THRR unchanged     Progression   Progression -- Continue to progress workloads to maintain intensity without signs/symptoms of physical distress. Continue to progress workloads to maintain intensity without signs/symptoms of physical distress. -- Continue to progress workloads to maintain intensity without signs/symptoms of physical distress.   Average METs -- 2 2.53 -- 2.5     Resistance Training   Training Prescription -- Yes Yes -- Yes   Weight -- 3 lb 3 lb -- 3 lb   Reps -- 10-15 10-15 -- 10-15     Interval Training   Interval Training -- No No -- No     Treadmill   MPH -- -- 0.5  -- --   Grade -- -- 0 -- --   Minutes -- -- 15 -- --   METs -- -- 1.38 -- --     Recumbant Bike   Level -- 1 1 -- 2   Watts -- 25 25 -- 18   Minutes -- 15 15 -- 15   METs -- -- 2.82 -- --     NuStep   Level -- -- 3 -- 3   Minutes -- -- 15 -- 15   METs -- -- 3.5 -- 2.7     REL-XR   Level -- -- 1 -- --   Minutes -- -- 15 -- --     T5 Nustep   Level -- 1 1  level 2 for 5 minutes -- 1   Minutes -- 15 15 -- 15   METs -- 2 -- -- 2.1     Biostep-RELP  Level -- -- 1 -- 1   Minutes -- -- 15 -- 15   METs -- -- 3 -- 3     Home Exercise Plan   Plans to continue exercise at -- -- -- Home (comment)  [employer] Home (comment)  [employer] Machine   Frequency -- -- -- Add 2 additional days to program exercise sessions. Add 2 additional days to program exercise sessions.   Initial Home Exercises Provided -- -- -- 05/04/23 05/04/23     Oxygen   Maintain Oxygen Saturation -- 88% or higher 88% or higher -- 88% or higher    Row Name 05/24/23 0800 06/06/23 1600 06/22/23 1100 07/04/23 1600 07/20/23 1000     Response to Exercise   Blood Pressure (Admit) 124/70 122/82 124/80 132/68 138/84   Blood Pressure (Exit) 102/60 124/80 118/64 104/60 112/88   Heart Rate (Admit) 69 bpm 90 bpm 89 bpm 192 bpm 79 bpm   Heart Rate (Exercise) 99 bpm 96 bpm 110 bpm 91 bpm 99 bpm   Heart Rate (Exit) 90 bpm 88 bpm 87 bpm 70 bpm 90 bpm   Oxygen Saturation (Admit) 91 % 95 % 93 % 95 % 94 %   Oxygen Saturation (Exercise) 91 % 88 % 89 % 96 % 95 %   Oxygen Saturation (Exit) 98 % 92 % 92 % 97 % 96 %   Rating of Perceived Exertion (Exercise) 12 13 13 13 13    Perceived Dyspnea (Exercise) 2 3 3 3 2    Symptoms SOB SOB SOB SOB --   Duration Continue with 30 min of aerobic exercise without signs/symptoms of physical distress. Continue with 30 min of aerobic exercise without signs/symptoms of physical distress. Continue with 30 min of aerobic exercise without signs/symptoms of physical distress. Continue with 30 min of  aerobic exercise without signs/symptoms of physical distress. Continue with 30 min of aerobic exercise without signs/symptoms of physical distress.   Intensity THRR unchanged THRR unchanged THRR unchanged THRR unchanged THRR unchanged     Progression   Progression Continue to progress workloads to maintain intensity without signs/symptoms of physical distress. Continue to progress workloads to maintain intensity without signs/symptoms of physical distress. Continue to progress workloads to maintain intensity without signs/symptoms of physical distress. Continue to progress workloads to maintain intensity without signs/symptoms of physical distress. Continue to progress workloads to maintain intensity without signs/symptoms of physical distress.   Average METs 2.67 2.76 2.65 2.25 2.63     Resistance Training   Training Prescription Yes Yes Yes Yes Yes   Weight 3 lb 3 lb 3 lb 3 lb 3 lb   Reps 10-15 10-15 10-15 10-15 10-15     Interval Training   Interval Training No No No No No     Recumbant Bike   Level 1 2 1  -- 2   Watts 13 16 16  -- 19   Minutes 15 15 15  -- 15   METs 2.6 2.53 2.54 -- 2.65     NuStep   Level 3 3 3 3 2    Minutes 15 30 30 15 15    METs 2.2 3.7 3.7 -- 3.5     REL-XR   Level 3 -- 1 1 --   Minutes 15 -- 15 15 --   METs 2.5 -- -- 1.9 --     T5 Nustep   Level -- 1 -- 1 2   Minutes -- 15 -- 15 15   METs -- -- -- 2.1 2.11  Biostep-RELP   Level 1 2 2 2 1    Minutes 15 15 15 15 15    METs 3 2 -- 3 3     Home Exercise Plan   Plans to continue exercise at Home (comment)  [employer] Machine Home (comment)  [employer] Machine Home (comment)  [employer] Machine Home (comment)  [employer] Machine Home (comment)  [employer] Machine   Frequency Add 2 additional days to program exercise sessions. Add 2 additional days to program exercise sessions. Add 2 additional days to program exercise sessions. Add 2 additional days to program exercise sessions. Add 2 additional days to program exercise  sessions.   Initial Home Exercises Provided 05/04/23 05/04/23 05/04/23 05/04/23 05/04/23     Oxygen   Maintain Oxygen Saturation 88% or higher 88% or higher 88% or higher 88% or higher 88% or higher    Row Name 08/01/23 1400             Response to Exercise   Blood Pressure (Admit) 122/64       Blood Pressure (Exit) 104/60       Heart Rate (Admit) 77 bpm       Heart Rate (Exercise) 95 bpm       Heart Rate (Exit) 78 bpm       Oxygen Saturation (Admit) 90 %       Oxygen Saturation (Exercise) 86 %       Oxygen Saturation (Exit) 98 %       Rating of Perceived Exertion (Exercise) 15       Perceived Dyspnea (Exercise) 1       Symptoms SOB       Duration Continue with 30 min of aerobic exercise without signs/symptoms of physical distress.       Intensity THRR unchanged         Progression   Progression Continue to progress workloads to maintain intensity without signs/symptoms of physical distress.       Average METs 2.26         Resistance Training   Training Prescription Yes       Weight 3 lb       Reps 10-15         Interval Training   Interval Training No         Recumbant Bike   Level 2       Watts 19       Minutes 15       METs 2.63         NuStep   Level 3       Minutes 15       METs 2.5         T5 Nustep   Level 2       Minutes 15       METs 1.9         Home Exercise Plan   Plans to continue exercise at Home (comment)  [employer] Machine       Frequency Add 2 additional days to program exercise sessions.       Initial Home Exercises Provided 05/04/23         Oxygen   Maintain Oxygen Saturation 88% or higher                Exercise Comments:   Exercise Comments     Row Name 04/06/23 1003           Exercise Comments First full day of exercise!  Patient was oriented to  gym and equipment including functions, settings, policies, and procedures.  Patient's individual exercise prescription and treatment plan were reviewed.  All starting workloads  were established based on the results of the 6 minute walk test done at initial orientation visit.  The plan for exercise progression was also introduced and progression will be customized based on patient's performance and goals.                Exercise Goals and Review:   Exercise Goals     Row Name 04/04/23 1538             Exercise Goals   Increase Physical Activity Yes       Intervention Provide advice, education, support and counseling about physical activity/exercise needs.;Develop an individualized exercise prescription for aerobic and resistive training based on initial evaluation findings, risk stratification, comorbidities and participant's personal goals.       Expected Outcomes Long Term: Add in home exercise to make exercise part of routine and to increase amount of physical activity.;Long Term: Exercising regularly at least 3-5 days a week.;Short Term: Attend rehab on a regular basis to increase amount of physical activity.       Increase Strength and Stamina Yes       Intervention Provide advice, education, support and counseling about physical activity/exercise needs.;Develop an individualized exercise prescription for aerobic and resistive training based on initial evaluation findings, risk stratification, comorbidities and participant's personal goals.       Expected Outcomes Short Term: Perform resistance training exercises routinely during rehab and add in resistance training at home;Short Term: Increase workloads from initial exercise prescription for resistance, speed, and METs.;Long Term: Improve cardiorespiratory fitness, muscular endurance and strength as measured by increased METs and functional capacity ( )       Able to understand and use rate of perceived exertion (RPE) scale Yes       Intervention Provide education and explanation on how to use RPE scale       Expected Outcomes Short Term: Able to use RPE daily in rehab to express subjective intensity  level;Long Term:  Able to use RPE to guide intensity level when exercising independently       Able to understand and use Dyspnea scale Yes       Intervention Provide education and explanation on how to use Dyspnea scale       Expected Outcomes Long Term: Able to use Dyspnea scale to guide intensity level when exercising independently;Short Term: Able to use Dyspnea scale daily in rehab to express subjective sense of shortness of breath during exertion       Knowledge and understanding of Target Heart Rate Range (THRR) Yes       Intervention Provide education and explanation of THRR including how the numbers were predicted and where they are located for reference       Expected Outcomes Short Term: Able to state/look up THRR;Long Term: Able to use THRR to govern intensity when exercising independently;Short Term: Able to use daily as guideline for intensity in rehab       Able to check pulse independently Yes       Intervention Provide education and demonstration on how to check pulse in carotid and radial arteries.;Review the importance of being able to check your own pulse for safety during independent exercise       Expected Outcomes Short Term: Able to explain why pulse checking is important during independent exercise;Long Term: Able to check pulse independently and accurately  Understanding of Exercise Prescription Yes       Intervention Provide education, explanation, and written materials on patient's individual exercise prescription       Expected Outcomes Short Term: Able to explain program exercise prescription;Long Term: Able to explain home exercise prescription to exercise independently                Exercise Goals Re-Evaluation :  Exercise Goals Re-Evaluation     Row Name 04/06/23 1003 04/11/23 1352 04/25/23 1454 05/04/23 1029 05/09/23 1409     Exercise Goal Re-Evaluation   Exercise Goals Review Able to understand and use rate of perceived exertion (RPE) scale;Able to  understand and use Dyspnea scale;Knowledge and understanding of Target Heart Rate Range (THRR);Understanding of Exercise Prescription Increase Physical Activity;Increase Strength and Stamina;Understanding of Exercise Prescription Increase Physical Activity;Increase Strength and Stamina;Understanding of Exercise Prescription Increase Physical Activity;Increase Strength and Stamina;Understanding of Exercise Prescription;Able to understand and use rate of perceived exertion (RPE) scale;Knowledge and understanding of Target Heart Rate Range (THRR);Able to understand and use Dyspnea scale;Able to check pulse independently Increase Physical Activity;Increase Strength and Stamina;Understanding of Exercise Prescription   Comments Reviewed RPE scale, THR and program prescription with pt today.  Pt voiced understanding and was given a copy of goals to take home. Jens is off to a good start in rehab. During his first day of exercise he was able to work at level 1 on both the T5 nustep and the recumbent bike. He also tolerated using 3 lb hand weights for resistance training. We will continue to monitor his progress in the program. Hendry continues to do well in rehab. He tried out level 2 on the T5 Nustep for about 5 minutes. He also increased to level 3 on the T4 Nustep. Klyde tried out the treadmill and was able to walk at a 0.5 mph speed. He had appropriate RPEs throughout his sessions. We will continue to monitoras he progresses. Triston is doing well in rehab.  He is already doing some exercise at home on his own.  Reviewed home exercise with pt today.  Pt plans to use Eclipse machine at home for exercise.  Reviewed THR, pulse, RPE, sign and symptoms, pulse oximetery and when to call 911 or MD.  Also discussed weather considerations and indoor options.  Pt voiced understanding. Tyquon continues to do well in rehab. He continues to work at an overall average MET level above 2.5 METs. He also continues to work at level 1 on  the T5 nustep and biostep, and level 2 on the recumbent bike and T4 nustep. We will continue to monitor his progress in the program.   Expected Outcomes Short: Use RPE daily to regulate intensity. Long: Follow program prescription in THR. Short: Continue to follow current exercise prescription. Long: Continue to improve strength and stamina. Short: Slowly increasing treadmill speed Long: Continue to increase overall MET level and stamina Short: Start to add in exercise at home routinely Long: Continue to exercise independently Short: Begin to progressively increase workloads. Long: Continue to improve strength and stamina.    Row Name 05/24/23 0755 06/06/23 1640 06/22/23 1140 07/04/23 1648 07/20/23 1048     Exercise Goal Re-Evaluation   Exercise Goals Review Increase Physical Activity;Increase Strength and Stamina;Understanding of Exercise Prescription Increase Physical Activity;Increase Strength and Stamina;Understanding of Exercise Prescription Increase Physical Activity;Increase Strength and Stamina;Understanding of Exercise Prescription Understanding of Exercise Prescription;Increase Strength and Stamina;Increase Physical Activity Increase Physical Activity;Increase Strength and Stamina;Understanding of Exercise Prescription   Comments Forney  is doing well in rehab.  He is up to level 3 on NuStep.  We will encourage him to increase other machines consistently and continue to monitor his progress. Deakon is doing well in rehab. He has improved to level 2 on the biostep and continues to work at level 3 on the T4 nustep. He also increased his overall average MET level to 2.76 METs. He has continued to do well with 3 lb hand weights for resistance training as well. We will continue to monitor his progress in the program. Makonnen continues to do well in rehab. He has stayed consistent with his workloads on all of his seated machines and has not done any walking in rehab. He continues to do well with 3 lb hand  weights for resistance training as well. We will continue to monitor his progress in the program. Cyndie Chime remains consistent with his workloads. He has not increased any yet.  will continue to monitor his progress and see if he can increase his workloads to continue working on strength and stamina. Angie missed some sessions, attending 2. He did increase his Recumbent BIke to level 2 from level 1. His RPE are at 13 and he continues to use the 3 lb handweights.   Expected Outcomes Short: Increase workload on BioStep and try 4 lb weights Long: Continue to improve stamina Short: Continue to progressively increase workloads and try 4 lb hand weights. Long: Continue to improve strength and stamina. Short: Try level 2 on the recumbent bike, and level 3 on the biostep. Long: Continue to improve strength and stamina. Short: Try level 2 on the recumbent bike, and level 3 on the biostep. Long: Continue to follow exercise progression to  improve strength and stamina. STG COntinue exercise progression as tolerated. LTG Continued exercise progression as tolerated during program and after discharge.    Row Name 08/01/23 1444             Exercise Goal Re-Evaluation   Exercise Goals Review Increase Physical Activity;Increase Strength and Stamina;Understanding of Exercise Prescription       Comments Ashay continues to do well in rehab. He has kept his workloads on the T4 nustep, T5 nustep, and recumbent bike consistent. He also is due for his post and will look to improve on it. We will continue to monitor his progress in the program.       Expected Outcomes Short: Improve on post . Long: Continue exercise to improve strength and stamina.                Discharge Exercise Prescription (Final Exercise Prescription Changes):  Exercise Prescription Changes - 08/01/23 1400       Response to Exercise   Blood Pressure (Admit) 122/64    Blood Pressure (Exit) 104/60    Heart Rate (Admit) 77 bpm    Heart  Rate (Exercise) 95 bpm    Heart Rate (Exit) 78 bpm    Oxygen Saturation (Admit) 90 %    Oxygen Saturation (Exercise) 86 %    Oxygen Saturation (Exit) 98 %    Rating of Perceived Exertion (Exercise) 15    Perceived Dyspnea (Exercise) 1    Symptoms SOB    Duration Continue with 30 min of aerobic exercise without signs/symptoms of physical distress.    Intensity THRR unchanged      Progression   Progression Continue to progress workloads to maintain intensity without signs/symptoms of physical distress.    Average METs 2.26  Resistance Training   Training Prescription Yes    Weight 3 lb    Reps 10-15      Interval Training   Interval Training No      Recumbant Bike   Level 2    Watts 19    Minutes 15    METs 2.63      NuStep   Level 3    Minutes 15    METs 2.5      T5 Nustep   Level 2    Minutes 15    METs 1.9      Home Exercise Plan   Plans to continue exercise at Home (comment)   [place] Machine   Frequency Add 2 additional days to program exercise sessions.    Initial Home Exercises Provided 05/04/23      Oxygen   Maintain Oxygen Saturation 88% or higher             Nutrition:  Target Goals: Understanding of nutrition guidelines, daily intake of sodium 1500mg , cholesterol 200mg , calories 30% from fat and 7% or less from saturated fats, daily to have 5 or more servings of fruits and vegetables.  Education: All About Nutrition: -Group instruction provided by verbal, written material, interactive activities, discussions, models, and posters to present general guidelines for heart healthy nutrition including fat, fiber, MyPlate, the role of sodium in heart healthy nutrition, utilization of the nutrition label, and utilization of this knowledge for meal planning. Follow up email sent as well. Written material given at graduation. Flowsheet Row Pulmonary Rehab from 05/11/2023 in [facility] Cardiac and Pulmonary Rehab  Date --  [part 2]       Biometrics:  Pre  Biometrics - 04/06/23 1009       Pre Biometrics   Waist Circumference 32 inches    Hip Circumference 43 inches    Waist to Hip Ratio 0.74 %              Nutrition Therapy Plan and Nutrition Goals:  Nutrition Therapy & Goals - 05/04/23 1042       Nutrition Therapy   RD appointment deferred Yes             Nutrition Assessments:  MEDIFICTS Score Key: ?70 Need to make dietary changes  40-70 Heart Healthy Diet ? 40 Therapeutic Level Cholesterol Diet  Flowsheet Row Pulmonary Rehab from 04/04/2023 in [facility] Cardiac and Pulmonary Rehab  Picture Your Plate Total Score on Admission 55      Picture Your Plate Scores: <16 Unhealthy dietary pattern with much room for improvement. 41-50 Dietary pattern unlikely to meet recommendations for good health and room for improvement. 51-60 More healthful dietary pattern, with some room for improvement.  >60 Healthy dietary pattern, although there may be some specific behaviors that could be improved.   Nutrition Goals Re-Evaluation:  Nutrition Goals Re-Evaluation     Row Name 05/04/23 1041 05/27/23 1031           Goals   Current Weight -- 208 lb (94.3 kg)      Nutrition Goal -- Eat smaller meals      Comment Aeneas's family does most of the cooking for he and his bed bound wife. He mentions he eats well and has no nutrition concerns. He does not want to meet with the dietician at this time. He states his appetite is good and he feels like his needs are being met Patient was informed on why it is important to maintain a balanced diet  when dealing with Respiratory issues. Explained that it takes a lot of energy to breath and when they are short of breath often they will need to have a good diet to help keep up with the calories they are expending for breathing.      Expected Outcome Short: continue maitenance with diet  Long: continue to follow a healthy pulmonary diet. Short: Choose and plan snacks accordingly to patients caloric  intake to improve breathing. Long: Maintain a diet independently that meets their caloric intake to aid in daily shortness of breath.               Nutrition Goals Discharge (Final Nutrition Goals Re-Evaluation):  Nutrition Goals Re-Evaluation - 05/27/23 1031       Goals   Current Weight 208 lb (94.3 kg)    Nutrition Goal Eat smaller meals    Comment Patient was informed on why it is important to maintain a balanced diet when dealing with Respiratory issues. Explained that it takes a lot of energy to breath and when they are short of breath often they will need to have a good diet to help keep up with the calories they are expending for breathing.    Expected Outcome Short: Choose and plan snacks accordingly to patients caloric intake to improve breathing. Long: Maintain a diet independently that meets their caloric intake to aid in daily shortness of breath.             Psychosocial: Target Goals: Acknowledge presence or absence of significant depression and/or stress, maximize coping skills, provide positive support system. Participant is able to verbalize types and ability to use techniques and skills needed for reducing stress and depression.   Education: Stress, Anxiety, and Depression - Group verbal and visual presentation to define topics covered.  Reviews how body is impacted by stress, anxiety, and depression.  Also discusses healthy ways to reduce stress and to treat/manage anxiety and depression.  Written material given at graduation.   Education: Sleep Hygiene -Provides group verbal and written instruction about how sleep can affect your health.  Define sleep hygiene, discuss sleep cycles and impact of sleep habits. Review good sleep hygiene tips.    Initial Review & Psychosocial Screening:  Initial Psych Review & Screening - 03/21/23 1556       Initial Review   Current issues with None Identified      Family Dynamics   Good Support System? Yes   daughter,  Rosey Bath     Barriers   Psychosocial barriers to participate in program There are no identifiable barriers or psychosocial needs.      Screening Interventions   Interventions Encouraged to exercise;To provide support and resources with identified psychosocial needs;Provide feedback about the scores to participant    Expected Outcomes Short Term goal: Utilizing psychosocial counselor, staff and physician to assist with identification of specific Stressors or current issues interfering with healing process. Setting desired goal for each stressor or current issue identified.;Long Term Goal: Stressors or current issues are controlled or eliminated.;Short Term goal: Identification and review with participant of any Quality of Life or Depression concerns found by scoring the questionnaire.;Long Term goal: The participant improves quality of Life and PHQ9 Scores as seen by post scores and/or verbalization of changes             Quality of Life Scores:  Scores of 19 and below usually indicate a poorer quality of life in these areas.  A difference of  2-3 points is  a clinically meaningful difference.  A difference of 2-3 points in the total score of the Quality of Life Index has been associated with significant improvement in overall quality of life, self-image, physical symptoms, and general health in studies assessing change in quality of life.  PHQ-9: Review Flowsheet  More data exists      04/04/2023 01/24/2023 10/26/2022 10/21/2021 11/13/2020  Depression screen PHQ 2/9  Decreased Interest 0 0 0 0 0  Down, Depressed, Hopeless 0 1 0 0 0  PHQ - 2 Score 0 1 0 0 0  Altered sleeping 0 2 0 0 -  Tired, decreased energy 3 3 3 1  -  Change in appetite 1 1 0 0 -  Feeling bad or failure about yourself  0 0 0 0 -  Trouble concentrating 0 1 0 0 -  Moving slowly or fidgety/restless 0 0 0 0 -  Suicidal thoughts 0 0 0 0 -  PHQ-9 Score 4 8 3 1  -  Difficult doing work/chores Not difficult at all Not difficult  at all Not difficult at all Not difficult at all -    Details           Interpretation of Total Score  Total Score Depression Severity:  1-4 = Minimal depression, 5-9 = Mild depression, 10-14 = Moderate depression, 15-19 = Moderately severe depression, 20-27 = Severe depression   Psychosocial Evaluation and Intervention:  Psychosocial Evaluation - 03/21/23 1608       Psychosocial Evaluation & Interventions   Interventions Encouraged to exercise with the program and follow exercise prescription    Comments Ukiah has no barriers to attending the program. He wants to leave the program breathing better. He has support from his daughter Rosey Bath. He has the desire to breath easier . He is ready to start the program    Expected Outcomes STG Yifan is able to attend all scheduled sessions, he is able to progress with his exercise prescription and work on breathing techniques to improve his day to day breathing with his daily activities.  LTG Linn continues to work on his exerise ad breathing techniques    Continue Psychosocial Services  Follow up required by staff             Psychosocial Re-Evaluation:  Psychosocial Re-Evaluation     Row Name 05/04/23 1027 05/27/23 1032           Psychosocial Re-Evaluation   Current issues with Current Stress Concerns;Current Sleep Concerns Current Stress Concerns      Comments Tyger reports his biggest stressor is managing his wife's care with Alzheimers. She has had it for a few years and is now bed bound. He has his daughter and aides helping him out. They help with shopping and some chores as well as stay the night with his wife. He worries about his wife and mentions he knows he needs to take care of himself as well. He reports he thinks he sleeps very well, however his doctor is sending him for a sleep study. They are a few months out on the schedule, so he doesn't know when he will go back for the actual study. He stays busy with his doctors  appointments and caring for his wife. He does know that this program is good for him because it is making him take time for himself. Patient reports no changes with their current mental states, sleep, stress, depression or anxiety. Will follow up with patient in a few weeks for any changes.  Expected Outcomes Short: attend pulmonary rehab consistently for exercise and education on breathing techniques and lifestyle changes. Long: independently maintain positive self care habits. Short: Continue to exercise regularly to support mental health and notify staff of any changes. Long: maintain mental health and well being through teaching of rehab or prescribed medications independently.      Interventions Stress management education Encouraged to attend Pulmonary Rehabilitation for the exercise      Continue Psychosocial Services  Follow up required by staff Follow up required by staff               Psychosocial Discharge (Final Psychosocial Re-Evaluation):  Psychosocial Re-Evaluation - 05/27/23 1032       Psychosocial Re-Evaluation   Current issues with Current Stress Concerns    Comments Patient reports no changes with their current mental states, sleep, stress, depression or anxiety. Will follow up with patient in a few weeks for any changes.    Expected Outcomes Short: Continue to exercise regularly to support mental health and notify staff of any changes. Long: maintain mental health and well being through teaching of rehab or prescribed medications independently.    Interventions Encouraged to attend Pulmonary Rehabilitation for the exercise    Continue Psychosocial Services  Follow up required by staff             Education: Education Goals: Education classes will be provided on a weekly basis, covering required topics. Participant will state understanding/return demonstration of topics presented.  Learning Barriers/Preferences:   General Pulmonary Education  Topics:  Infection Prevention: - Provides verbal and written material to individual with discussion of infection control including proper hand washing and proper equipment cleaning during exercise session. Flowsheet Row Pulmonary Rehab from 05/11/2023 in [facility] Cardiac and Pulmonary Rehab  Date 04/04/23  Educator [facility]  Instruction Review Code 1- Verbalizes Understanding       Falls Prevention: - Provides verbal and written material to individual with discussion of falls prevention and safety. Flowsheet Row Pulmonary Rehab from 05/11/2023 in [facility] Cardiac and Pulmonary Rehab  Date 03/21/23  Educator SB  Instruction Review Code 1- Verbalizes Understanding       Chronic Lung Disease Review: - Group verbal instruction with posters, models, PowerPoint presentations and videos,  to review new updates, new respiratory medications, new advancements in procedures and treatments. Providing information on websites and "800" numbers for continued self-education. Includes information about supplement oxygen, available portable oxygen systems, continuous and intermittent flow rates, oxygen safety, concentrators, and Medicare reimbursement for oxygen. Explanation of Pulmonary Drugs, including class, frequency, complications, importance of spacers, rinsing mouth after steroid MDI's, and proper cleaning methods for nebulizers. Review of basic lung anatomy and physiology related to function, structure, and complications of lung disease. Review of risk factors. Discussion about methods for diagnosing sleep apnea and types of masks and machines for OSA. Includes a review of the use of types of environmental controls: home humidity, furnaces, filters, dust mite/pet prevention, HEPA vacuums. Discussion about weather changes, air quality and the benefits of nasal washing. Instruction on Warning signs, infection symptoms, calling MD promptly, preventive modes, and value of vaccinations. Review of effective airway clearance,  coughing and/or vibration techniques. Emphasizing that all should Create an Action Plan. Written material given at graduation. Flowsheet Row Pulmonary Rehab from 05/11/2023 in [facility] Cardiac and Pulmonary Rehab  Education need identified 04/04/23  Date 05/11/23  Educator [facility]  Instruction Review Code 1- Verbalizes Understanding       AED/CPR: - Group verbal and  written instruction with the use of models to demonstrate the basic use of the AED with the basic ABC's of resuscitation.    Anatomy and Cardiac Procedures: - Group verbal and visual presentation and models provide information about basic cardiac anatomy and function. Reviews the testing methods done to diagnose heart disease and the outcomes of the test results. Describes the treatment choices: Medical Management, Angioplasty, or Coronary Bypass Surgery for treating various heart conditions including Myocardial Infarction, Angina, Valve Disease, and Cardiac Arrhythmias.  Written material given at graduation.   Medication Safety: - Group verbal and visual instruction to review commonly prescribed medications for heart and lung disease. Reviews the medication, class of the drug, and side effects. Includes the steps to properly store meds and maintain the prescription regimen.  Written material given at graduation.   Other: -Provides group and verbal instruction on various topics (see comments)   Knowledge Questionnaire Score:  Knowledge Questionnaire Score - 04/04/23 1541       Knowledge Questionnaire Score   Pre Score 16/18              Core Components/Risk Factors/Patient Goals at Admission:  Personal Goals and Risk Factors at Admission - 04/04/23 1541       Core Components/Risk Factors/Patient Goals on Admission    Weight Management Yes;Weight Loss    Intervention Weight Management: Develop a combined nutrition and exercise program designed to reach desired caloric intake, while maintaining appropriate intake of  nutrient and fiber, sodium and fats, and appropriate energy expenditure required for the weight goal.;Weight Management: Provide education and appropriate resources to help participant work on and attain dietary goals.;Weight Management/Obesity: Establish reasonable short term and long term weight goals.    Admit Weight 210 lb 1.6 oz (95.3 kg)    Goal Weight: Short Term 205 lb (93 kg)    Goal Weight: Long Term 200 lb (90.7 kg)    Expected Outcomes Short Term: Continue to assess and modify interventions until short term weight is achieved;Long Term: Adherence to nutrition and physical activity/exercise program aimed toward attainment of established weight goal;Weight Loss: Understanding of general recommendations for a balanced deficit meal plan, which promotes 1-2 lb weight loss per week and includes a negative energy balance of [phone] kcal/d;Understanding recommendations for meals to include 15-35% energy as protein, 25-35% energy from fat, 35-60% energy from carbohydrates, less than 200mg  of dietary cholesterol, 20-35 gm of total fiber daily;Understanding of distribution of calorie intake throughout the day with the consumption of 4-5 meals/snacks    Improve shortness of breath with ADL's Yes    Intervention Provide education, individualized exercise plan and daily activity instruction to help decrease symptoms of SOB with activities of daily living.    Expected Outcomes Short Term: Improve cardiorespiratory fitness to achieve a reduction of symptoms when performing ADLs;Long Term: Be able to perform more ADLs without symptoms or delay the onset of symptoms    Increase knowledge of respiratory medications and ability to use respiratory devices properly  Yes    Intervention Provide education and demonstration as needed of appropriate use of medications, inhalers, and oxygen therapy.    Expected Outcomes Short Term: Achieves understanding of medications use. Understands that oxygen is a medication  prescribed by physician. Demonstrates appropriate use of inhaler and oxygen therapy.;Long Term: Maintain appropriate use of medications, inhalers, and oxygen therapy.    Hypertension Yes    Intervention Provide education on lifestyle modifcations including regular physical activity/exercise, weight management, moderate sodium restriction and increased consumption  of fresh fruit, vegetables, and low fat dairy, alcohol moderation, and smoking cessation.;Monitor prescription use compliance.    Expected Outcomes Short Term: Continued assessment and intervention until BP is < 140/56mm HG in hypertensive participants. < 130/63mm HG in hypertensive participants with diabetes, heart failure or chronic kidney disease.;Long Term: Maintenance of blood pressure at goal levels.    Lipids Yes    Intervention Provide education and support for participant on nutrition & aerobic/resistive exercise along with prescribed medications to achieve LDL 70mg , HDL >40mg .    Expected Outcomes Short Term: Participant states understanding of desired cholesterol values and is compliant with medications prescribed. Participant is following exercise prescription and nutrition guidelines.;Long Term: Cholesterol controlled with medications as prescribed, with individualized exercise RX and with personalized nutrition plan. Value goals: LDL < 70mg , HDL > 40 mg.             Education:Diabetes - Individual verbal and written instruction to review signs/symptoms of diabetes, desired ranges of glucose level fasting, after meals and with exercise. Acknowledge that pre and post exercise glucose checks will be done for 3 sessions at entry of program.   Know Your Numbers and Heart Failure: - Group verbal and visual instruction to discuss disease risk factors for cardiac and pulmonary disease and treatment options.  Reviews associated critical values for Overweight/Obesity, Hypertension, Cholesterol, and Diabetes.  Discusses basics of  heart failure: signs/symptoms and treatments.  Introduces Heart Failure Zone chart for action plan for heart failure.  Written material given at graduation.   Core Components/Risk Factors/Patient Goals Review:   Goals and Risk Factor Review     Row Name 05/04/23 1021 05/27/23 1030           Core Components/Risk Factors/Patient Goals Review   Personal Goals Review Improve shortness of breath with ADL's;Hypertension Improve shortness of breath with ADL's      Review Coburn reports feeling at his baseline. He hasn't seen any improvemenet since starting the program, but he is motivated to continue coming and is optimistic to see what his progress will be. His blood pressure has been stable so far in the program. He does not check it regularly at home. He does have a pulse ox which he uses sometimes. He has a lot of help at home with shopping and chores, but he does note that he has a hard time doing some things around the house because of his breathing. He tries to remember to do his PLB and rest as needed. He had a knee replacement during COVID and couldn't go to rehab, so he has become more sedentary which he states that his breathing has gotten worse since then. He is trying to be more active by coming to the program. Spoke to patient about their shortness of breath and what they can do to improve. Patient has been informed of breathing techniques when starting the program. Patient is informed to tell staff if they have had any med changes and that certain meds they are taking or not taking can be causing shortness of breath.      Expected Outcomes Short: attend pulmonary rehab to work on his shortness of breath with ADLs. Long: independently manage breathing techniques Short: Attend [facility] regularly to improve shortness of breath with ADL's. Long: maintain independence with ADL's               Core Components/Risk Factors/Patient Goals at Discharge (Final Review):   Goals and Risk Factor  Review - 05/27/23 1030  Core Components/Risk Factors/Patient Goals Review   Personal Goals Review Improve shortness of breath with ADL's    Review Spoke to patient about their shortness of breath and what they can do to improve. Patient has been informed of breathing techniques when starting the program. Patient is informed to tell staff if they have had any med changes and that certain meds they are taking or not taking can be causing shortness of breath.    Expected Outcomes Short: Attend [facility] regularly to improve shortness of breath with ADL's. Long: maintain independence with ADL's             ITP Comments:  ITP Comments     Row Name 03/21/23 1614 04/04/23 1533 04/06/23 1002 04/13/23 1326 05/11/23 0919   ITP Comments Virtual orientation call completed today. he has an appointment on Date: 04/04/2023  for EP eval and gym Orientation.  Documentation of diagnosis can be found in [facility] Date: [id number] . Completed and gym orientation. Initial ITP created and sent for review to Dr. Jinny Sanders, Medical Director. First full day of exercise!  Patient was oriented to gym and equipment including functions, settings, policies, and procedures.  Patient's individual exercise prescription and treatment plan were reviewed.  All starting workloads were established based on the results of the 6 minute walk test done at initial orientation visit.  The plan for exercise progression was also introduced and progression will be customized based on patient's performance and goals. 30 day review completed. ITP sent to Dr. Jinny Sanders, Medical Director of  Pulmonary Rehab. Continue with ITP unless changes are made by physician. 30 Day review completed. Medical Director ITP review done, changes made as directed, and signed approval by Medical Director.    Row Name 06/08/23 1103 07/05/23 1440 08/03/23 1154       ITP Comments 30 Day review completed. Medical Director ITP review done, changes made as  directed, and signed approval by Medical Director. 30 Day review completed. Medical Director ITP review done, changes made as directed, and signed approval by Medical Director. 30 Day review completed. Medical Director ITP review done, changes made as directed, and signed approval by Medical Director.              Comments:

## 2023-08-03 NOTE — Progress Notes (Signed)
Daily Session Note  Patient Details  Name: Daniel Carr MRN: [id number] Date of Birth: 1935/04/04 Referring Provider:   Flowsheet Row Pulmonary Rehab from 04/04/2023 in [facility] Cardiac and Pulmonary Rehab  Referring Provider Mila Merry MD       Encounter Date: 08/03/2023  Check In:  Session Check In - 08/03/23 1009       Check-In   Supervising physician immediately available to respond to emergencies See telemetry face sheet for immediately available ER MD    Location [facility]-Cardiac & Pulmonary Rehab    Staff Present Lanny Hurst, RN, ADN;Joseph Griswold, RCP,RRT,BSRT;Other   Girtha Rm, MS   Virtual Visit No    Medication changes reported     No    Fall or balance concerns reported    No    Warm-up and Cool-down Performed on first and last piece of equipment    Resistance Training Performed Yes    VAD Patient? No    PAD/SET Patient? No      Pain Assessment   Currently in Pain? No/denies                Social History   Tobacco Use  Smoking Status Former   Current packs/day: 0.00   Average packs/day: 0.3 packs/day for 6.0 years (1.5 ttl pk-yrs)   Types: Cigarettes   Start date: 11/14/1963   Quit date: 11/13/1969   Years since quitting: 53.7  Smokeless Tobacco Never  Tobacco Comments   quit in 1970's    Goals Met:  Independence with exercise equipment Exercise tolerated well No report of concerns or symptoms today Strength training completed today  Goals Unmet:  Not Applicable  Comments: Pt able to follow exercise prescription today without complaint.  Will continue to monitor for progression.    Dr. Bethann Punches is Medical Director for [facility] Cardiac Rehabilitation.  Dr. Vida Rigger is Medical Director for [facility] Pulmonary Rehabilitation.

## 2023-08-10 ENCOUNTER — Encounter: Payer: Medicare HMO | Admitting: *Deleted

## 2023-08-10 VITALS — Ht 72.0 in | Wt 204.1 lb

## 2023-08-10 DIAGNOSIS — Z87891 Personal history of nicotine dependence: Secondary | ICD-10-CM | POA: Diagnosis not present

## 2023-08-10 DIAGNOSIS — J449 Chronic obstructive pulmonary disease, unspecified: Secondary | ICD-10-CM | POA: Diagnosis not present

## 2023-08-10 DIAGNOSIS — Z5189 Encounter for other specified aftercare: Secondary | ICD-10-CM | POA: Diagnosis not present

## 2023-08-10 DIAGNOSIS — J986 Disorders of diaphragm: Secondary | ICD-10-CM

## 2023-08-10 NOTE — Progress Notes (Signed)
Daily Session Note  Patient Details  Name: Kashtyn Ensor MRN: [id number] Date of Birth: June 13, 1935 Referring Provider:   Flowsheet Row Pulmonary Rehab from 04/04/2023 in [facility] Cardiac and Pulmonary Rehab  Referring Provider Mila Merry MD       Encounter Date: 08/10/2023  Check In:  Session Check In - 08/10/23 0958       Check-In   Supervising physician immediately available to respond to emergencies See telemetry face sheet for immediately available ER MD    Location [facility]-Cardiac & Pulmonary Rehab    Staff Present Cora Collum, RN, BSN, CCRP;Margaret Best, MS, Exercise Physiologist;Meredith Jewel Baize, RN Atilano Median, RN, ADN    Virtual Visit No    Medication changes reported     No    Fall or balance concerns reported    No    Warm-up and Cool-down Performed on first and last piece of equipment    Resistance Training Performed Yes    VAD Patient? No    PAD/SET Patient? No      Pain Assessment   Currently in Pain? No/denies                Social History   Tobacco Use  Smoking Status Former   Current packs/day: 0.00   Average packs/day: 0.3 packs/day for 6.0 years (1.5 ttl pk-yrs)   Types: Cigarettes   Start date: 11/14/1963   Quit date: 11/13/1969   Years since quitting: 53.7  Smokeless Tobacco Never  Tobacco Comments   quit in 1970's    Goals Met:  Independence with exercise equipment Exercise tolerated well No report of concerns or symptoms today Strength training completed today  Goals Unmet:  Not Applicable  Comments: Pt able to follow exercise prescription today without complaint.  Will continue to monitor for progression.     Dr. Bethann Punches is Medical Director for [facility] Cardiac Rehabilitation.  Dr. Vida Rigger is Medical Director for [facility] Pulmonary Rehabilitation.

## 2023-08-10 NOTE — Patient Instructions (Signed)
Discharge Patient Instructions  Patient Details  Name: Daniel Carr MRN: [id number] Date of Birth: February 23, 1935 Referring Provider:  Malva Limes, MD   Number of Visits: 89  Reason for Discharge:  Patient reached a stable level of exercise. Patient independent in their exercise. Patient has met program and personal goals.  Smoking History:  Social History   Tobacco Use  Smoking Status Former   Current packs/day: 0.00   Average packs/day: 0.3 packs/day for 6.0 years (1.5 ttl pk-yrs)   Types: Cigarettes   Start date: 11/14/1963   Quit date: 11/13/1969   Years since quitting: 53.7  Smokeless Tobacco Never  Tobacco Comments   quit in 1970's    Diagnosis:  Stage 3 severe COPD by GOLD classification (HCC)  Diaphragm paralysis  Initial Exercise Prescription:  Initial Exercise Prescription - 04/04/23 1500       Date of Initial Exercise RX and Referring Provider   Date 04/04/23    Referring Provider Mila Merry MD      Oxygen   Maintain Oxygen Saturation 88% or higher      Recumbant Bike   Level 1    RPM 50    Watts 5    Minutes 15    METs 2      NuStep   Level 1    SPM 80    Minutes 15    METs 2      T5 Nustep   Level 1    SPM 80    Minutes 1    METs 2      Biostep-RELP   Level 1    SPM 50    Minutes 15    METs 2      Prescription Details   Frequency (times per week) 2    Duration Progress to 30 minutes of continuous aerobic without signs/symptoms of physical distress      Intensity   THRR 40-80% of Max Heartrate 99-122    Ratings of Perceived Exertion 11-13    Perceived Dyspnea 0-4      Progression   Progression Continue to progress workloads to maintain intensity without signs/symptoms of physical distress.      Resistance Training   Training Prescription Yes    Weight 3 lb    Reps 10-15             Discharge Exercise Prescription (Final Exercise Prescription Changes):  Exercise Prescription Changes - 08/01/23 1400        Response to Exercise   Blood Pressure (Admit) 122/64    Blood Pressure (Exit) 104/60    Heart Rate (Admit) 77 bpm    Heart Rate (Exercise) 95 bpm    Heart Rate (Exit) 78 bpm    Oxygen Saturation (Admit) 90 %    Oxygen Saturation (Exercise) 86 %    Oxygen Saturation (Exit) 98 %    Rating of Perceived Exertion (Exercise) 15    Perceived Dyspnea (Exercise) 1    Symptoms SOB    Duration Continue with 30 min of aerobic exercise without signs/symptoms of physical distress.    Intensity THRR unchanged      Progression   Progression Continue to progress workloads to maintain intensity without signs/symptoms of physical distress.    Average METs 2.26      Resistance Training   Training Prescription Yes    Weight 3 lb    Reps 10-15      Interval Training   Interval Training No      Recumbant  Bike   Level 2    Watts 19    Minutes 15    METs 2.63      NuStep   Level 3    Minutes 15    METs 2.5      T5 Nustep   Level 2    Minutes 15    METs 1.9      Home Exercise Plan   Plans to continue exercise at Home (comment)   Elcipse Machine   Frequency Add 2 additional days to program exercise sessions.    Initial Home Exercises Provided 05/04/23      Oxygen   Maintain Oxygen Saturation 88% or higher             Functional Capacity:  6 Minute Walk     Row Name 04/04/23 1533 08/10/23 1107       6 Minute Walk   Phase Initial Discharge    Distance 310 feet 425 feet    Distance % Change -- 37.1 %    Distance Feet Change -- 115 ft    Walk Time 5 minutes 5.5 minutes    # of Rest Breaks 1  1 minute rest 1    MPH 0.7 0.88    METS 0.51 0.63    RPE 13 13    Perceived Dyspnea  3 3    VO2 Peak 1.77 2.2    Symptoms Yes (comment) Yes (comment)    Comments fatigue, SOB, chronic foot pain SOB, used walker    Resting HR 75 bpm 74 bpm    Resting BP 134/66 130/70    Resting Oxygen Saturation  94 % 95 %    Exercise Oxygen Saturation  during 6 min walk 87 % 90 %    Max Ex. HR  102 bpm 101 bpm    Max Ex. BP 154/72 138/74    2 Minute Post BP 138/70 118/66      Interval HR   1 Minute HR 94 87    2 Minute HR 98 94    3 Minute HR 99 95    4 Minute HR 96 97    5 Minute HR 101 101    6 Minute HR 102 100    2 Minute Post HR 79 76    Interval Heart Rate? Yes Yes      Interval Oxygen   Interval Oxygen? Yes Yes    Baseline Oxygen Saturation % 94 % 95 %    1 Minute Oxygen Saturation % 89 % 92 %    1 Minute Liters of Oxygen 0 L  Room Air 0 L  Room Air    2 Minute Oxygen Saturation % 87 % 92 %    2 Minute Liters of Oxygen 0 L 0 L    3 Minute Oxygen Saturation % 84 % 90 %    3 Minute Liters of Oxygen 0 L 0 L    4 Minute Oxygen Saturation % 88 % 93 %    4 Minute Liters of Oxygen 0 L 0 L    5 Minute Oxygen Saturation % 87 % 92 %    5 Minute Liters of Oxygen 0 L 0 L    6 Minute Oxygen Saturation % 89 % 91 %    6 Minute Liters of Oxygen 0 L 0 L    2 Minute Post Oxygen Saturation % 98 % 96 %    2 Minute Post Liters of Oxygen 0 L 0 L  Nutrition & Weight - Outcomes:  Pre Biometrics - 04/06/23 1009       Pre Biometrics   Waist Circumference 32 inches    Hip Circumference 43 inches    Waist to Hip Ratio 0.74 %             Post Biometrics - 08/10/23 1110        Post  Biometrics   Height 6' (1.829 m)    Weight 204 lb 1.6 oz (92.6 kg)    BMI (Calculated) 27.67            Nutrition:  Nutrition Therapy & Goals - 05/04/23 1042       Nutrition Therapy   RD appointment deferred Yes

## 2023-08-12 ENCOUNTER — Encounter: Payer: Medicare HMO | Admitting: *Deleted

## 2023-08-12 DIAGNOSIS — Z87891 Personal history of nicotine dependence: Secondary | ICD-10-CM | POA: Diagnosis not present

## 2023-08-12 DIAGNOSIS — J986 Disorders of diaphragm: Secondary | ICD-10-CM

## 2023-08-12 DIAGNOSIS — Z5189 Encounter for other specified aftercare: Secondary | ICD-10-CM | POA: Diagnosis not present

## 2023-08-12 DIAGNOSIS — J449 Chronic obstructive pulmonary disease, unspecified: Secondary | ICD-10-CM

## 2023-08-12 NOTE — Progress Notes (Signed)
Daily Session Note  Patient Details  Name: Beatriz Jennison MRN: [id number] Date of Birth: Oct 16, 1935 Referring Provider:   Flowsheet Row Pulmonary Rehab from 04/04/2023 in [facility] Cardiac and Pulmonary Rehab  Referring Provider Mila Merry MD       Encounter Date: 08/12/2023  Check In:  Session Check In - 08/12/23 0956       Check-In   Supervising physician immediately available to respond to emergencies See telemetry face sheet for immediately available ER MD    Location [facility]-Cardiac & Pulmonary Rehab    Staff Present Cyndia Diver, RN, BSN, MA;Susanne Bice, RN, BSN, CCRP;Noah Tickle, BS, Exercise Physiologist    Virtual Visit No    Medication changes reported     No    Fall or balance concerns reported    No    Tobacco Cessation Use Increase    Warm-up and Cool-down Performed on first and last piece of equipment    Resistance Training Performed Yes    VAD Patient? No    PAD/SET Patient? No      Pain Assessment   Currently in Pain? No/denies                Social History   Tobacco Use  Smoking Status Former   Current packs/day: 0.00   Average packs/day: 0.3 packs/day for 6.0 years (1.5 ttl pk-yrs)   Types: Cigarettes   Start date: 11/14/1963   Quit date: 11/13/1969   Years since quitting: 53.7  Smokeless Tobacco Never  Tobacco Comments   quit in 1970's    Goals Met:  Independence with exercise equipment Exercise tolerated well No report of concerns or symptoms today  Goals Unmet:  Not Applicable  Comments: Pt able to follow exercise prescription today without complaint.  Will continue to monitor for progression.    Dr. Bethann Punches is Medical Director for [facility] Cardiac Rehabilitation.  Dr. Vida Rigger is Medical Director for [facility] Pulmonary Rehabilitation.

## 2023-08-17 ENCOUNTER — Encounter: Payer: Medicare HMO | Admitting: *Deleted

## 2023-08-17 DIAGNOSIS — J449 Chronic obstructive pulmonary disease, unspecified: Secondary | ICD-10-CM

## 2023-08-17 DIAGNOSIS — J986 Disorders of diaphragm: Secondary | ICD-10-CM

## 2023-08-17 DIAGNOSIS — Z5189 Encounter for other specified aftercare: Secondary | ICD-10-CM | POA: Diagnosis not present

## 2023-08-17 DIAGNOSIS — Z87891 Personal history of nicotine dependence: Secondary | ICD-10-CM | POA: Diagnosis not present

## 2023-08-17 NOTE — Progress Notes (Signed)
Daily Session Note  Patient Details  Name: Daniel Carr MRN: [id number] Date of Birth: 02/10/1935 Referring Provider:   Flowsheet Row Pulmonary Rehab from 04/04/2023 in [facility] Cardiac and Pulmonary Rehab  Referring Provider Mila Merry MD       Encounter Date: 08/17/2023  Check In:  Session Check In - 08/17/23 0956       Check-In   Supervising physician immediately available to respond to emergencies See telemetry face sheet for immediately available ER MD    Location [facility]-Cardiac & Pulmonary Rehab    Staff Present Cora Collum, RN, BSN, CCRP;Margaret Best, MS, Exercise Physiologist;Shannyn Jankowiak Katrinka Blazing, RN, ADN    Virtual Visit No    Medication changes reported     No    Fall or balance concerns reported    No    Warm-up and Cool-down Performed on first and last piece of equipment    Resistance Training Performed Yes    VAD Patient? No    PAD/SET Patient? No      Pain Assessment   Currently in Pain? No/denies                Social History   Tobacco Use  Smoking Status Former   Current packs/day: 0.00   Average packs/day: 0.3 packs/day for 6.0 years (1.5 ttl pk-yrs)   Types: Cigarettes   Start date: 11/14/1963   Quit date: 11/13/1969   Years since quitting: 53.7  Smokeless Tobacco Never  Tobacco Comments   quit in 1970's    Goals Met:  Independence with exercise equipment Exercise tolerated well No report of concerns or symptoms today Strength training completed today  Goals Unmet:  Not Applicable  Comments: Pt able to follow exercise prescription today without complaint.  Will continue to monitor for progression.    Dr. Bethann Punches is Medical Director for [facility] Cardiac Rehabilitation.  Dr. Vida Rigger is Medical Director for [facility] Pulmonary Rehabilitation.

## 2023-08-24 ENCOUNTER — Encounter: Payer: Medicare HMO | Admitting: *Deleted

## 2023-08-24 DIAGNOSIS — J986 Disorders of diaphragm: Secondary | ICD-10-CM

## 2023-08-24 DIAGNOSIS — J449 Chronic obstructive pulmonary disease, unspecified: Secondary | ICD-10-CM

## 2023-08-24 DIAGNOSIS — Z87891 Personal history of nicotine dependence: Secondary | ICD-10-CM | POA: Diagnosis not present

## 2023-08-24 DIAGNOSIS — Z5189 Encounter for other specified aftercare: Secondary | ICD-10-CM | POA: Diagnosis not present

## 2023-08-24 NOTE — Progress Notes (Signed)
Daily Session Note  Patient Details  Name: Daniel Carr MRN: [id number] Date of Birth: June 20, 1935 Referring Provider:   Flowsheet Row Pulmonary Rehab from 04/04/2023 in [facility] Cardiac and Pulmonary Rehab  Referring Provider Mila Merry MD       Encounter Date: 08/24/2023  Check In:  Session Check In - 08/24/23 1003       Check-In   Supervising physician immediately available to respond to emergencies See telemetry face sheet for immediately available ER MD    Location [facility]-Cardiac & Pulmonary Rehab    Staff Present Rory Percy, MS, Exercise Physiologist;Joseph Shelbie Proctor, RN, ADN    Virtual Visit No    Medication changes reported     No    Fall or balance concerns reported    No    Warm-up and Cool-down Performed on first and last piece of equipment    Resistance Training Performed Yes    VAD Patient? No    PAD/SET Patient? No      Pain Assessment   Currently in Pain? No/denies                Social History   Tobacco Use  Smoking Status Former   Current packs/day: 0.00   Average packs/day: 0.3 packs/day for 6.0 years (1.5 ttl pk-yrs)   Types: Cigarettes   Start date: 11/14/1963   Quit date: 11/13/1969   Years since quitting: 53.8  Smokeless Tobacco Never  Tobacco Comments   quit in 1970's    Goals Met:  Independence with exercise equipment Exercise tolerated well No report of concerns or symptoms today Strength training completed today  Goals Unmet:  Not Applicable  Comments: Pt able to follow exercise prescription today without complaint.  Will continue to monitor for progression.    Dr. Bethann Punches is Medical Director for [facility] Cardiac Rehabilitation.  Dr. Vida Rigger is Medical Director for [facility] Pulmonary Rehabilitation.

## 2023-08-26 ENCOUNTER — Encounter: Payer: Medicare HMO | Admitting: *Deleted

## 2023-08-26 DIAGNOSIS — J986 Disorders of diaphragm: Secondary | ICD-10-CM

## 2023-08-26 DIAGNOSIS — J449 Chronic obstructive pulmonary disease, unspecified: Secondary | ICD-10-CM | POA: Diagnosis not present

## 2023-08-26 DIAGNOSIS — Z5189 Encounter for other specified aftercare: Secondary | ICD-10-CM | POA: Diagnosis not present

## 2023-08-26 DIAGNOSIS — Z87891 Personal history of nicotine dependence: Secondary | ICD-10-CM | POA: Diagnosis not present

## 2023-08-26 NOTE — Progress Notes (Signed)
Daily Session Note  Patient Details  Name: Dorean Wolak MRN: [id number] Date of Birth: 1935-11-18 Referring Provider:   Flowsheet Row Pulmonary Rehab from 04/04/2023 in [facility] Cardiac and Pulmonary Rehab  Referring Provider Mila Merry MD       Encounter Date: 08/26/2023  Check In:  Session Check In - 08/26/23 0957       Check-In   Supervising physician immediately available to respond to emergencies See telemetry face sheet for immediately available ER MD    Location [facility]-Cardiac & Pulmonary Rehab    Staff Present Cyndia Diver, RN, BSN, Rita Ohara, RN, ADN;Joseph Reino Kent, RCP,RRT,BSRT    Virtual Visit No    Medication changes reported     No    Fall or balance concerns reported    No    Tobacco Cessation No Change    Warm-up and Cool-down Performed on first and last piece of equipment    Resistance Training Performed Yes    VAD Patient? No    PAD/SET Patient? No      Pain Assessment   Currently in Pain? No/denies                Social History   Tobacco Use  Smoking Status Former   Current packs/day: 0.00   Average packs/day: 0.3 packs/day for 6.0 years (1.5 ttl pk-yrs)   Types: Cigarettes   Start date: 11/14/1963   Quit date: 11/13/1969   Years since quitting: 53.8  Smokeless Tobacco Never  Tobacco Comments   quit in 1970's    Goals Met:  Independence with exercise equipment Exercise tolerated well No report of concerns or symptoms today Strength training completed today  Goals Unmet:  Not Applicable  Comments: Pt able to follow exercise prescription today without complaint.  Will continue to monitor for progression.    Dr. Bethann Punches is Medical Director for [facility] Cardiac Rehabilitation.  Dr. Vida Rigger is Medical Director for [facility] Pulmonary Rehabilitation.

## 2023-08-31 ENCOUNTER — Encounter: Payer: Self-pay | Admitting: *Deleted

## 2023-08-31 ENCOUNTER — Encounter: Payer: Medicare HMO | Attending: Family Medicine | Admitting: *Deleted

## 2023-08-31 DIAGNOSIS — Z87891 Personal history of nicotine dependence: Secondary | ICD-10-CM | POA: Insufficient documentation

## 2023-08-31 DIAGNOSIS — J449 Chronic obstructive pulmonary disease, unspecified: Secondary | ICD-10-CM | POA: Diagnosis present

## 2023-08-31 NOTE — Progress Notes (Signed)
discharged

## 2023-08-31 NOTE — Progress Notes (Signed)
Discharge Summary   Daniel Carr DOB: 05/06/1935  Daniel Carr graduated today from  rehab with 36 sessions completed.  Details of the patient's exercise prescription and what He needs to do in order to continue the prescription and progress were discussed with patient.  Patient was given a copy of prescription and goals.  Patient verbalized understanding. Daniel Carr plans to continue to exercise by walking.   6 Minute Walk     Row Name 04/04/23 1533 08/10/23 1107       6 Minute Walk   Phase Initial Discharge    Distance 310 feet 425 feet    Distance % Change -- 37.1 %    Distance Feet Change -- 115 ft    Walk Time 5 minutes 5.5 minutes    # of Rest Breaks 1  1 minute rest 1    MPH 0.7 0.88    METS 0.51 0.63    RPE 13 13    Perceived Dyspnea  3 3    VO2 Peak 1.77 2.2    Symptoms Yes (comment) Yes (comment)    Comments fatigue, SOB, chronic foot pain SOB, used walker    Resting HR 75 bpm 74 bpm    Resting BP 134/66 130/70    Resting Oxygen Saturation  94 % 95 %    Exercise Oxygen Saturation  during 6 min walk 87 % 90 %    Max Ex. HR 102 bpm 101 bpm    Max Ex. BP 154/72 138/74    2 Minute Post BP 138/70 118/66      Interval HR   1 Minute HR 94 87    2 Minute HR 98 94    3 Minute HR 99 95    4 Minute HR 96 97    5 Minute HR 101 101    6 Minute HR 102 100    2 Minute Post HR 79 76    Interval Heart Rate? Yes Yes      Interval Oxygen   Interval Oxygen? Yes Yes    Baseline Oxygen Saturation % 94 % 95 %    1 Minute Oxygen Saturation % 89 % 92 %    1 Minute Liters of Oxygen 0 L  Room Air 0 L  Room Air    2 Minute Oxygen Saturation % 87 % 92 %    2 Minute Liters of Oxygen 0 L 0 L    3 Minute Oxygen Saturation % 84 % 90 %    3 Minute Liters of Oxygen 0 L 0 L    4 Minute Oxygen Saturation % 88 % 93 %    4 Minute Liters of Oxygen 0 L 0 L    5 Minute Oxygen Saturation % 87 % 92 %    5 Minute Liters of Oxygen 0 L 0 L    6 Minute Oxygen Saturation % 89 % 91 %    6 Minute Liters of Oxygen  0 L 0 L    2 Minute Post Oxygen Saturation % 98 % 96 %    2 Minute Post Liters of Oxygen 0 L 0 L

## 2023-08-31 NOTE — Progress Notes (Signed)
Daily Session Note  Patient Details  Name: Daniel Carr MRN: [id number] Date of Birth: [birth year] Referring Provider:   Flowsheet Row Pulmonary Rehab from 04/04/2023 in [facility] Cardiac and Pulmonary Rehab  Referring Provider Daniel Merry MD       Encounter Date: 08/31/2023  Check In:  Session Check In - 08/31/23 1027       Check-In   Supervising physician immediately available to respond to emergencies See telemetry face sheet for immediately available ER MD    Location [facility]-Cardiac & Pulmonary Rehab    Staff Present Lanny Hurst, RN, ADN;Maxon Conetta BS, , Exercise Physiologist;Margaret Best, MS, Exercise Physiologist;Noah Tickle, BS, Exercise Physiologist    Virtual Visit No    Medication changes reported     No    Fall or balance concerns reported    No    Warm-up and Cool-down Performed on first and last piece of equipment    Resistance Training Performed Yes    VAD Patient? No    PAD/SET Patient? No      Pain Assessment   Currently in Pain? No/denies                Social History   Tobacco Use  Smoking Status Former   Current packs/day: 0.00   Average packs/day: 0.3 packs/day for 6.0 years (1.5 ttl pk-yrs)   Types: Cigarettes   Start date: 11/14/1963   Quit date: 11/13/1969   Years since quitting: 53.8  Smokeless Tobacco Never  Tobacco Comments   quit in 1970's    Goals Met:  Independence with exercise equipment Exercise tolerated well No report of concerns or symptoms today Strength training completed today  Goals Unmet:  Not Applicable  Comments:  Daniel Carr graduated today from  rehab with 36 sessions completed.  Details of the patient's exercise prescription and what He needs to do in order to continue the prescription and progress were discussed with patient.  Patient was given a copy of prescription and goals.  Patient verbalized understanding. Daniel Carr plans to continue to exercise by walking.    Dr. Bethann Punches is Medical Director for [facility]  Cardiac Rehabilitation.  Dr. Vida Rigger is Medical Director for [facility] Pulmonary Rehabilitation.

## 2023-08-31 NOTE — Progress Notes (Signed)
Pulmonary Individual Treatment Plan  Patient Details  Name: Daniel Carr MRN: [id number] Date of Birth: 1935-07-04 Referring Provider:   Flowsheet Row Pulmonary Rehab from 04/04/2023 in [facility] Cardiac and Pulmonary Rehab  Referring Provider Mila Merry MD       Initial Encounter Date:  Flowsheet Row Pulmonary Rehab from 04/04/2023 in [facility] Cardiac and Pulmonary Rehab  Date 04/04/23       Visit Diagnosis: Stage 3 severe COPD by GOLD classification (HCC)  Patient's Home Medications on Admission:  Current Outpatient Medications:    amLODipine (NORVASC) 2.5 MG tablet, Take 1 tablet by mouth once daily, Disp: 90 tablet, Rfl: 4   aspirin 81 MG EC tablet, Take 1 tablet (81 mg total) by mouth daily. Swallow whole., Disp: , Rfl:    azelastine (ASTELIN) 0.1 % nasal spray, Place 1 spray into the nose daily as needed (allergies)., Disp: , Rfl:    brimonidine (ALPHAGAN) 0.2 % ophthalmic solution, Place 1 drop into both eyes in the morning and at bedtime., Disp: , Rfl:    clotrimazole-betamethasone (LOTRISONE) cream, APPLY TOPICALLY ONCE DAILY (Patient not taking: Reported on 03/21/2023), Disp: 15 g, Rfl: 0   COD LIVER OIL PO, Take 1 capsule by mouth daily. (Patient not taking: Reported on 03/21/2023), Disp: , Rfl:    dorzolamide (TRUSOPT) 2 % ophthalmic solution, Apply to eye., Disp: , Rfl:    dutasteride (AVODART) 0.5 MG capsule, Take 1 capsule by mouth once daily, Disp: 90 capsule, Rfl: 4   enalapril-hydrochlorothiazide (VASERETIC) 10-25 MG tablet, hydroCHLOROthiazide 25mg  (Patient not taking: Reported on 03/21/2023), Disp: , Rfl:    hydrochlorothiazide (HYDRODIURIL) 25 MG tablet, TAKE ONE-HALF TABLET BY MOUTH ONCE DAILY, Disp: 45 tablet, Rfl: 03   ipratropium (ATROVENT) 0.06 % nasal spray, Place 1 spray into both nostrils 2 (two) times daily as needed for rhinitis., Disp: , Rfl: 6   latanoprost (XALATAN) 0.005 % ophthalmic solution, SMARTSIG:In Eye(s), Disp: , Rfl:    levocetirizine (XYZAL) 5 MG  tablet, Take 5 mg by mouth every evening., Disp: , Rfl:    levothyroxine (SYNTHROID) 150 MCG tablet, Take 1 tablet by mouth once daily, Disp: 90 tablet, Rfl: 4   lovastatin (MEVACOR) 40 MG tablet, Take 1 tablet by mouth once daily, Disp: 90 tablet, Rfl: 3   metoprolol tartrate (LOPRESSOR) 100 MG tablet, Take tablet TWO hours prior to his cardiac CT scan., Disp: 1 tablet, Rfl: 0   Multiple Vitamin (DAILY VITAMIN PO), Take 1 tablet by mouth daily., Disp: , Rfl:    naproxen (NAPROSYN) 500 MG tablet, Take 1 tablet (500 mg total) by mouth 2 (two) times daily with a meal. (Patient not taking: Reported on 03/21/2023), Disp: 30 tablet, Rfl: 1   predniSONE (DELTASONE) 5 MG tablet, Take by mouth. (Patient not taking: Reported on 04/29/2023), Disp: , Rfl:    traMADol (ULTRAM) 50 MG tablet, Take 50 mg by mouth every 6 (six) hours as needed., Disp: , Rfl:    vitamin B-12 (CYANOCOBALAMIN) 100 MCG tablet, Take 100 mcg by mouth daily. Unsure of dose  is OTC med, Disp: , Rfl:   Past Medical History: Past Medical History:  Diagnosis Date   Arthritis    childhood infection    chicken pox, measels, and mumps   Glaucoma    Hypertension    Hypothyroidism    Sinus problem    Ulnar neuropathy    bilateral    Tobacco Use: Social History   Tobacco Use  Smoking Status Former   Current packs/day: 0.00  Average packs/day: 0.3 packs/day for 6.0 years (1.5 ttl pk-yrs)   Types: Cigarettes   Start date: 11/14/1963   Quit date: 11/13/1969   Years since quitting: 53.8  Smokeless Tobacco Never  Tobacco Comments   quit in 1970's    Labs: Review Flowsheet  More data exists      Latest Ref Rng & Units 06/11/2019 07/22/2020 07/06/2021 03/29/2022 10/26/2022  Labs for ITP Cardiac and Pulmonary Rehab  Cholestrol 100 - 199 mg/dL 409  811  914  782  956   LDL (calc) 0 - 99 mg/dL 76  64  74  66  71   HDL-C >39 mg/dL 65  68  76  81  90   Trlycerides 0 - 149 mg/dL 76  89  61  60  57     Details              Pulmonary Assessment Scores:  Pulmonary Assessment Scores     Row Name 04/04/23 1543 08/12/23 1038       ADL UCSD   ADL Phase Entry Exit    SOB Score total 72 76    Rest 3 0    Walk 4 3    Stairs 4 5    Bath 2 2    Dress 2 2    Shop 3 4      CAT Score   CAT Score 17 19      mMRC Score   mMRC Score 4 --             UCSD: Self-administered rating of dyspnea associated with activities of daily living (ADLs) 6-point scale (0 = "not at all" to 5 = "maximal or unable to do because of breathlessness")  Scoring Scores range from 0 to 120.  Minimally important difference is 5 units  CAT: CAT can identify the health impairment of COPD patients and is better correlated with disease progression.  CAT has a scoring range of zero to 40. The CAT score is classified into four groups of low (less than 10), medium (10 - 20), high (21-30) and very high (31-40) based on the impact level of disease on health status. A CAT score over 10 suggests significant symptoms.  A worsening CAT score could be explained by an exacerbation, poor medication adherence, poor inhaler technique, or progression of COPD or comorbid conditions.  CAT MCID is 2 points  mMRC: mMRC (Modified Medical Research Council) Dyspnea Scale is used to assess the degree of baseline functional disability in patients of respiratory disease due to dyspnea. No minimal important difference is established. A decrease in score of 1 point or greater is considered a positive change.   Pulmonary Function Assessment:  Pulmonary Function Assessment - 04/04/23 1543       Pulmonary Function Tests   FVC% 2.28 %   test from 02/04/21   FEV1% 1.17 %    FEV1/FVC Ratio 51.52      Breath   Shortness of Breath Yes;Limiting activity             Exercise Target Goals: Exercise Program Goal: Individual exercise prescription set using results from initial 6 min walk test and THRR while considering  patient's activity barriers and  safety.   Exercise Prescription Goal: Initial exercise prescription builds to 30-45 minutes a day of aerobic activity, 2-3 days per week.  Home exercise guidelines will be given to patient during program as part of exercise prescription that the participant will acknowledge.  Education: Aerobic  Exercise: - Group verbal and visual presentation on the components of exercise prescription. Introduces F.I.T.T principle from ACSM for exercise prescriptions.  Reviews F.I.T.T. principles of aerobic exercise including progression. Written material given at graduation.   Education: Resistance Exercise: - Group verbal and visual presentation on the components of exercise prescription. Introduces F.I.T.T principle from ACSM for exercise prescriptions  Reviews F.I.T.T. principles of resistance exercise including progression. Written material given at graduation.    Education: Exercise & Equipment Safety: - Individual verbal instruction and demonstration of equipment use and safety with use of the equipment. Flowsheet Row Pulmonary Rehab from 05/11/2023 in [facility] Cardiac and Pulmonary Rehab  Date 04/04/23  Educator [facility]  Instruction Review Code 1- Verbalizes Understanding       Education: Exercise Physiology & General Exercise Guidelines: - Group verbal and written instruction with models to review the exercise physiology of the cardiovascular system and associated critical values. Provides general exercise guidelines with specific guidelines to those with heart or lung disease.    Education: Flexibility, Balance, Mind/Body Relaxation: - Group verbal and visual presentation with interactive activity on the components of exercise prescription. Introduces F.I.T.T principle from ACSM for exercise prescriptions. Reviews F.I.T.T. principles of flexibility and balance exercise training including progression. Also discusses the mind body connection.  Reviews various relaxation techniques to help reduce and manage  stress (i.e. Deep breathing, progressive muscle relaxation, and visualization). Balance handout provided to take home. Written material given at graduation.   Activity Barriers & Risk Stratification:  Activity Barriers & Cardiac Risk Stratification - 04/04/23 1535       Activity Barriers & Cardiac Risk Stratification   Activity Barriers Joint Problems;Right Knee Replacement;Left Knee Replacement;Arthritis;Muscular Weakness;Deconditioning;Balance Concerns;Other (comment);Shortness of Breath;History of Falls;Assistive Device    Comments Arthritis all over causing deformities in hands, neuropathy in feet             6 Minute Walk:  6 Minute Walk     Row Name 04/04/23 1533 08/10/23 1107       6 Minute Walk   Phase Initial Discharge    Distance 310 feet 425 feet    Distance % Change -- 37.1 %    Distance Feet Change -- 115 ft    Walk Time 5 minutes 5.5 minutes    # of Rest Breaks 1  1 minute rest 1    MPH 0.7 0.88    METS 0.51 0.63    RPE 13 13    Perceived Dyspnea  3 3    VO2 Peak 1.77 2.2    Symptoms Yes (comment) Yes (comment)    Comments fatigue, SOB, chronic foot pain SOB, used walker    Resting HR 75 bpm 74 bpm    Resting BP 134/66 130/70    Resting Oxygen Saturation  94 % 95 %    Exercise Oxygen Saturation  during 6 min walk 87 % 90 %    Max Ex. HR 102 bpm 101 bpm    Max Ex. BP 154/72 138/74    2 Minute Post BP 138/70 118/66      Interval HR   1 Minute HR 94 87    2 Minute HR 98 94    3 Minute HR 99 95    4 Minute HR 96 97    5 Minute HR 101 101    6 Minute HR 102 100    2 Minute Post HR 79 76    Interval Heart Rate? Yes Yes  Interval Oxygen   Interval Oxygen? Yes Yes    Baseline Oxygen Saturation % 94 % 95 %    1 Minute Oxygen Saturation % 89 % 92 %    1 Minute Liters of Oxygen 0 L  Room Air 0 L  Room Air    2 Minute Oxygen Saturation % 87 % 92 %    2 Minute Liters of Oxygen 0 L 0 L    3 Minute Oxygen Saturation % 84 % 90 %    3 Minute Liters of  Oxygen 0 L 0 L    4 Minute Oxygen Saturation % 88 % 93 %    4 Minute Liters of Oxygen 0 L 0 L    5 Minute Oxygen Saturation % 87 % 92 %    5 Minute Liters of Oxygen 0 L 0 L    6 Minute Oxygen Saturation % 89 % 91 %    6 Minute Liters of Oxygen 0 L 0 L    2 Minute Post Oxygen Saturation % 98 % 96 %    2 Minute Post Liters of Oxygen 0 L 0 L            Oxygen Initial Assessment:  Oxygen Initial Assessment - 04/04/23 1542       Home Oxygen   Home Oxygen Device None    Sleep Oxygen Prescription None    Home Exercise Oxygen Prescription None    Home Resting Oxygen Prescription None    Compliance with Home Oxygen Use Yes      Initial 6 min Walk   Oxygen Used None      Program Oxygen Prescription   Program Oxygen Prescription None      Intervention   Short Term Goals To learn and demonstrate proper pursed lip breathing techniques or other breathing techniques. ;To learn and understand importance of maintaining oxygen saturations>88%;To learn and demonstrate proper use of respiratory medications;To learn and understand importance of monitoring SPO2 with pulse oximeter and demonstrate accurate use of the pulse oximeter.    Long  Term Goals Exhibits proper breathing techniques, such as pursed lip breathing or other method taught during program session;Compliance with respiratory medication;Demonstrates proper use of MDI's;Maintenance of O2 saturations>88%;Verbalizes importance of monitoring SPO2 with pulse oximeter and return demonstration             Oxygen Re-Evaluation:  Oxygen Re-Evaluation     Row Name 04/06/23 1004 05/04/23 1055 05/27/23 1027 08/24/23 1010       Program Oxygen Prescription   Program Oxygen Prescription -- None None None      Home Oxygen   Home Oxygen Device -- None None None    Sleep Oxygen Prescription -- None None None    Home Exercise Oxygen Prescription -- None None None    Home Resting Oxygen Prescription -- None None None    Compliance with  Home Oxygen Use -- Yes Yes Yes      Goals/Expected Outcomes   Short Term Goals -- To learn and demonstrate proper pursed lip breathing techniques or other breathing techniques. ;To learn and understand importance of maintaining oxygen saturations>88%;To learn and demonstrate proper use of respiratory medications;To learn and understand importance of monitoring SPO2 with pulse oximeter and demonstrate accurate use of the pulse oximeter. To learn and understand importance of maintaining oxygen saturations>88%;To learn and understand importance of monitoring SPO2 with pulse oximeter and demonstrate accurate use of the pulse oximeter. To learn and understand importance of maintaining oxygen saturations>88%;To  learn and understand importance of monitoring SPO2 with pulse oximeter and demonstrate accurate use of the pulse oximeter.    Long  Term Goals -- Exhibits proper breathing techniques, such as pursed lip breathing or other method taught during program session;Compliance with respiratory medication;Demonstrates proper use of MDI's;Maintenance of O2 saturations>88%;Verbalizes importance of monitoring SPO2 with pulse oximeter and return demonstration Verbalizes importance of monitoring SPO2 with pulse oximeter and return demonstration;Maintenance of O2 saturations>88% Verbalizes importance of monitoring SPO2 with pulse oximeter and return demonstration;Maintenance of O2 saturations>88%    Comments Reviewed PLB technique with pt.  Talked about how it works and it's importance in maintaining their exercise saturations. Ezri is trying to remember to use his PLB when needed. He takes breaks and sits as needed. He hasn't noticed a difference in his breathing since starting the program yet, but is optimistic. He uses his pulse ox sometimes. He is on the list for a sleep study but doesn't know when it will be since they are a few months behind on the schedule Has a pulse oximeter to check her/his oxygen saturation at  home. Informed him where to get one and explained why it is important to have one. Reviewed that oxygen saturations should be 88 percent and above. Zeph has a pulse oximeter to check his oxygen saturation at home. He has been checking his O2 saturations at home and states that it has stayed above 90%. Reviewed that oxygen saturations should be 88% and above. He has also been working on PLB as well.    Goals/Expected Outcomes Short: Become more profiecient at using PLB. Long: Become independent at using PLB. Short: get his sleep study scheduled and practice his PLB. Long: independently manage his breathing techniques Short: monitor oxygen at home with exertion. Long: maintain oxygen saturations above 88 percent independently. Short: Continue to monitor oxygen saturations at home with exertion. Long: Continue to maintain oxygen saturations above 88 percent independently.             Oxygen Discharge (Final Oxygen Re-Evaluation):  Oxygen Re-Evaluation - 08/24/23 1010       Program Oxygen Prescription   Program Oxygen Prescription None      Home Oxygen   Home Oxygen Device None    Sleep Oxygen Prescription None    Home Exercise Oxygen Prescription None    Home Resting Oxygen Prescription None    Compliance with Home Oxygen Use Yes      Goals/Expected Outcomes   Short Term Goals To learn and understand importance of maintaining oxygen saturations>88%;To learn and understand importance of monitoring SPO2 with pulse oximeter and demonstrate accurate use of the pulse oximeter.    Long  Term Goals Verbalizes importance of monitoring SPO2 with pulse oximeter and return demonstration;Maintenance of O2 saturations>88%    Comments Lakshya has a pulse oximeter to check his oxygen saturation at home. He has been checking his O2 saturations at home and states that it has stayed above 90%. Reviewed that oxygen saturations should be 88% and above. He has also been working on PLB as well.    Goals/Expected  Outcomes Short: Continue to monitor oxygen saturations at home with exertion. Long: Continue to maintain oxygen saturations above 88 percent independently.             Initial Exercise Prescription:  Initial Exercise Prescription - 04/04/23 1500       Date of Initial Exercise RX and Referring Provider   Date 04/04/23    Referring Provider Mila Merry MD  Oxygen   Maintain Oxygen Saturation 88% or higher      Recumbant Bike   Level 1    RPM 50    Watts 5    Minutes 15    METs 2      NuStep   Level 1    SPM 80    Minutes 15    METs 2      T5 Nustep   Level 1    SPM 80    Minutes 1    METs 2      Biostep-RELP   Level 1    SPM 50    Minutes 15    METs 2      Prescription Details   Frequency (times per week) 2    Duration Progress to 30 minutes of continuous aerobic without signs/symptoms of physical distress      Intensity   THRR 40-80% of Max Heartrate 99-122    Ratings of Perceived Exertion 11-13    Perceived Dyspnea 0-4      Progression   Progression Continue to progress workloads to maintain intensity without signs/symptoms of physical distress.      Resistance Training   Training Prescription Yes    Weight 3 lb    Reps 10-15             Perform Capillary Blood Glucose checks as needed.  Exercise Prescription Changes:   Exercise Prescription Changes     Row Name 04/04/23 1500 04/11/23 1300 04/25/23 1400 05/04/23 1000 05/09/23 1400     Response to Exercise   Blood Pressure (Admit) 134/66 128/64 114/60 -- --   Blood Pressure (Exercise) 154/72 136/70 146/60 -- --   Blood Pressure (Exit) 138/70 122/60 128/62 -- --   Heart Rate (Admit) 75 bpm 90 bpm 78 bpm -- 70 bpm   Heart Rate (Exercise) 102 bpm 100 bpm 102 bpm -- 111 bpm   Heart Rate (Exit) 80 bpm 100 bpm 89 bpm -- 82 bpm   Oxygen Saturation (Admit) 94 % 90 % 95 % -- 92 %   Oxygen Saturation (Exercise) 87 % 90 % 89 % -- 88 %   Oxygen Saturation (Exit) 95 % 95 % 97 % -- 96 %    Rating of Perceived Exertion (Exercise) 13 13 15  -- 13   Perceived Dyspnea (Exercise) 3 3 3  -- 3   Symptoms SOB, chronic foot pain SOB SOB -- SOB   Comments walk test results First full day of exercise -- -- --   Duration -- Progress to 30 minutes of  aerobic without signs/symptoms of physical distress Progress to 30 minutes of  aerobic without signs/symptoms of physical distress -- Progress to 30 minutes of  aerobic without signs/symptoms of physical distress   Intensity -- THRR unchanged THRR unchanged -- THRR unchanged     Progression   Progression -- Continue to progress workloads to maintain intensity without signs/symptoms of physical distress. Continue to progress workloads to maintain intensity without signs/symptoms of physical distress. -- Continue to progress workloads to maintain intensity without signs/symptoms of physical distress.   Average METs -- 2 2.53 -- 2.5     Resistance Training   Training Prescription -- Yes Yes -- Yes   Weight -- 3 lb 3 lb -- 3 lb   Reps -- 10-15 10-15 -- 10-15     Interval Training   Interval Training -- No No -- No     Treadmill   MPH -- -- 0.5 -- --  Grade -- -- 0 -- --   Minutes -- -- 15 -- --   METs -- -- 1.38 -- --     Recumbant Bike   Level -- 1 1 -- 2   Watts -- 25 25 -- 18   Minutes -- 15 15 -- 15   METs -- -- 2.82 -- --     NuStep   Level -- -- 3 -- 3   Minutes -- -- 15 -- 15   METs -- -- 3.5 -- 2.7     REL-XR   Level -- -- 1 -- --   Minutes -- -- 15 -- --     T5 Nustep   Level -- 1 1  level 2 for 5 minutes -- 1   Minutes -- 15 15 -- 15   METs -- 2 -- -- 2.1     Biostep-RELP   Level -- -- 1 -- 1   Minutes -- -- 15 -- 15   METs -- -- 3 -- 3     Home Exercise Plan   Plans to continue exercise at -- -- -- Home (comment)  [employer] Home (comment)  [employer]   Frequency -- -- -- Add 2 additional days to program exercise sessions. Add 2 additional days to program exercise sessions.   Initial Home Exercises  Provided -- -- -- 05/04/23 05/04/23     Oxygen   Maintain Oxygen Saturation -- 88% or higher 88% or higher -- 88% or higher    Row Name 05/24/23 0800 06/06/23 1600 06/22/23 1100 07/04/23 1600 07/20/23 1000     Response to Exercise   Blood Pressure (Admit) 124/70 122/82 124/80 132/68 138/84   Blood Pressure (Exit) 102/60 124/80 118/64 104/60 112/88   Heart Rate (Admit) 69 bpm 90 bpm 89 bpm 192 bpm 79 bpm   Heart Rate (Exercise) 99 bpm 96 bpm 110 bpm 91 bpm 99 bpm   Heart Rate (Exit) 90 bpm 88 bpm 87 bpm 70 bpm 90 bpm   Oxygen Saturation (Admit) 91 % 95 % 93 % 95 % 94 %   Oxygen Saturation (Exercise) 91 % 88 % 89 % 96 % 95 %   Oxygen Saturation (Exit) 98 % 92 % 92 % 97 % 96 %   Rating of Perceived Exertion (Exercise) 12 13 13 13 13    Perceived Dyspnea (Exercise) 2 3 3 3 2    Symptoms SOB SOB SOB SOB --   Duration Continue with 30 min of aerobic exercise without signs/symptoms of physical distress. Continue with 30 min of aerobic exercise without signs/symptoms of physical distress. Continue with 30 min of aerobic exercise without signs/symptoms of physical distress. Continue with 30 min of aerobic exercise without signs/symptoms of physical distress. Continue with 30 min of aerobic exercise without signs/symptoms of physical distress.   Intensity THRR unchanged THRR unchanged THRR unchanged THRR unchanged THRR unchanged     Progression   Progression Continue to progress workloads to maintain intensity without signs/symptoms of physical distress. Continue to progress workloads to maintain intensity without signs/symptoms of physical distress. Continue to progress workloads to maintain intensity without signs/symptoms of physical distress. Continue to progress workloads to maintain intensity without signs/symptoms of physical distress. Continue to progress workloads to maintain intensity without signs/symptoms of physical distress.   Average METs 2.67 2.76 2.65 2.25 2.63     Resistance Training    Training Prescription Yes Yes Yes Yes Yes   Weight 3 lb 3 lb 3 lb 3 lb 3  lb   Reps 10-15 10-15 10-15 10-15 10-15     Interval Training   Interval Training No No No No No     Recumbant Bike   Level 1 2 1  -- 2   Watts 13 16 16  -- 19   Minutes 15 15 15  -- 15   METs 2.6 2.53 2.54 -- 2.65     NuStep   Level 3 3 3 3 2    Minutes 15 30 30 15 15    METs 2.2 3.7 3.7 -- 3.5     REL-XR   Level 3 -- 1 1 --   Minutes 15 -- 15 15 --   METs 2.5 -- -- 1.9 --     T5 Nustep   Level -- 1 -- 1 2   Minutes -- 15 -- 15 15   METs -- -- -- 2.1 2.11     Biostep-RELP   Level 1 2 2 2 1    Minutes 15 15 15 15 15    METs 3 2 -- 3 3     Home Exercise Plan   Plans to continue exercise at Home (comment)  [employer] Machine Home (comment)  [employer] Machine Home (comment)  [employer] Machine Home (comment)  [employer] Machine Home (comment)  [employer] Machine   Frequency Add 2 additional days to program exercise sessions. Add 2 additional days to program exercise sessions. Add 2 additional days to program exercise sessions. Add 2 additional days to program exercise sessions. Add 2 additional days to program exercise sessions.   Initial Home Exercises Provided 05/04/23 05/04/23 05/04/23 05/04/23 05/04/23     Oxygen   Maintain Oxygen Saturation 88% or higher 88% or higher 88% or higher 88% or higher 88% or higher    Row Name 08/01/23 1400 08/15/23 1000 08/30/23 1300         Response to Exercise   Blood Pressure (Admit) 122/64 108/60 122/60     Blood Pressure (Exit) 104/60 112/58 112/64     Heart Rate (Admit) 77 bpm 70 bpm 79 bpm     Heart Rate (Exercise) 95 bpm 101 bpm 94 bpm     Heart Rate (Exit) 78 bpm 87 bpm 88 bpm     Oxygen Saturation (Admit) 90 % 94 % 92 %     Oxygen Saturation (Exercise) 86 % 91 % 86 %     Oxygen Saturation (Exit) 98 % 94 % 88 %     Rating of Perceived Exertion (Exercise) 15 13 13      Perceived Dyspnea (Exercise) 1 3 2      Symptoms SOB SOB SOB     Duration Continue with 30 min of  aerobic exercise without signs/symptoms of physical distress. Continue with 30 min of aerobic exercise without signs/symptoms of physical distress. Continue with 30 min of aerobic exercise without signs/symptoms of physical distress.     Intensity THRR unchanged THRR unchanged THRR unchanged       Progression   Progression Continue to progress workloads to maintain intensity without signs/symptoms of physical distress. Continue to progress workloads to maintain intensity without signs/symptoms of physical distress. Continue to progress workloads to maintain intensity without signs/symptoms of physical distress.     Average METs 2.26 2.87 2.66       Resistance Training   Training Prescription Yes Yes Yes     Weight 3 lb 3 lb 3 lb     Reps 10-15 10-15 10-15       Interval Training   Interval Training No  No No       Recumbant Bike   Level 2 3 3      Watts 19 23 22      Minutes 15 15 15      METs 2.63 2.78 2.74       NuStep   Level 3 2 --     Minutes 15 15 --     METs 2.5 2.7 --       T5 Nustep   Level 2 -- 1     Minutes 15 -- 15     METs 1.9 -- 2.1       Biostep-RELP   Level -- 2 2     Minutes -- 15 15     METs -- 3 5       Home Exercise Plan   Plans to continue exercise at Home (comment)  Elcipse Machine Home (comment)  Elcipse Machine Home (comment)  Elcipse Machine     Frequency Add 2 additional days to program exercise sessions. Add 2 additional days to program exercise sessions. Add 2 additional days to program exercise sessions.     Initial Home Exercises Provided 05/04/23 05/04/23 05/04/23       Oxygen   Maintain Oxygen Saturation 88% or higher 88% or higher 88% or higher              Exercise Comments:   Exercise Comments     Row Name 04/06/23 1003           Exercise Comments First full day of exercise!  Patient was oriented to gym and equipment including functions, settings, policies, and procedures.  Patient's individual exercise prescription and  treatment plan were reviewed.  All starting workloads were established based on the results of the 6 minute walk test done at initial orientation visit.  The plan for exercise progression was also introduced and progression will be customized based on patient's performance and goals.                Exercise Goals and Review:   Exercise Goals     Row Name 04/04/23 1538             Exercise Goals   Increase Physical Activity Yes       Intervention Provide advice, education, support and counseling about physical activity/exercise needs.;Develop an individualized exercise prescription for aerobic and resistive training based on initial evaluation findings, risk stratification, comorbidities and participant's personal goals.       Expected Outcomes Long Term: Add in home exercise to make exercise part of routine and to increase amount of physical activity.;Long Term: Exercising regularly at least 3-5 days a week.;Short Term: Attend rehab on a regular basis to increase amount of physical activity.       Increase Strength and Stamina Yes       Intervention Provide advice, education, support and counseling about physical activity/exercise needs.;Develop an individualized exercise prescription for aerobic and resistive training based on initial evaluation findings, risk stratification, comorbidities and participant's personal goals.       Expected Outcomes Short Term: Perform resistance training exercises routinely during rehab and add in resistance training at home;Short Term: Increase workloads from initial exercise prescription for resistance, speed, and METs.;Long Term: Improve cardiorespiratory fitness, muscular endurance and strength as measured by increased METs and functional capacity ( )       Able to understand and use rate of perceived exertion (RPE) scale Yes       Intervention Provide education and explanation on  how to use RPE scale       Expected Outcomes Short Term: Able to use  RPE daily in rehab to express subjective intensity level;Long Term:  Able to use RPE to guide intensity level when exercising independently       Able to understand and use Dyspnea scale Yes       Intervention Provide education and explanation on how to use Dyspnea scale       Expected Outcomes Long Term: Able to use Dyspnea scale to guide intensity level when exercising independently;Short Term: Able to use Dyspnea scale daily in rehab to express subjective sense of shortness of breath during exertion       Knowledge and understanding of Target Heart Rate Range (THRR) Yes       Intervention Provide education and explanation of THRR including how the numbers were predicted and where they are located for reference       Expected Outcomes Short Term: Able to state/look up THRR;Long Term: Able to use THRR to govern intensity when exercising independently;Short Term: Able to use daily as guideline for intensity in rehab       Able to check pulse independently Yes       Intervention Provide education and demonstration on how to check pulse in carotid and radial arteries.;Review the importance of being able to check your own pulse for safety during independent exercise       Expected Outcomes Short Term: Able to explain why pulse checking is important during independent exercise;Long Term: Able to check pulse independently and accurately       Understanding of Exercise Prescription Yes       Intervention Provide education, explanation, and written materials on patient's individual exercise prescription       Expected Outcomes Short Term: Able to explain program exercise prescription;Long Term: Able to explain home exercise prescription to exercise independently                Exercise Goals Re-Evaluation :  Exercise Goals Re-Evaluation     Row Name 04/06/23 1003 04/11/23 1352 04/25/23 1454 05/04/23 1029 05/09/23 1409     Exercise Goal Re-Evaluation   Exercise Goals Review Able to understand and  use rate of perceived exertion (RPE) scale;Able to understand and use Dyspnea scale;Knowledge and understanding of Target Heart Rate Range (THRR);Understanding of Exercise Prescription Increase Physical Activity;Increase Strength and Stamina;Understanding of Exercise Prescription Increase Physical Activity;Increase Strength and Stamina;Understanding of Exercise Prescription Increase Physical Activity;Increase Strength and Stamina;Understanding of Exercise Prescription;Able to understand and use rate of perceived exertion (RPE) scale;Knowledge and understanding of Target Heart Rate Range (THRR);Able to understand and use Dyspnea scale;Able to check pulse independently Increase Physical Activity;Increase Strength and Stamina;Understanding of Exercise Prescription   Comments Reviewed RPE scale, THR and program prescription with pt today.  Pt voiced understanding and was given a copy of goals to take home. Donquarius is off to a good start in rehab. During his first day of exercise he was able to work at level 1 on both the T5 nustep and the recumbent bike. He also tolerated using 3 lb hand weights for resistance training. We will continue to monitor his progress in the program. Cayd continues to do well in rehab. He tried out level 2 on the T5 Nustep for about 5 minutes. He also increased to level 3 on the T4 Nustep. Urian tried out the treadmill and was able to walk at a 0.5 mph speed. He had appropriate RPEs throughout his sessions.  We will continue to monitoras he progresses. Hosam is doing well in rehab.  He is already doing some exercise at home on his own.  Reviewed home exercise with pt today.  Pt plans to use Eclipse machine at home for exercise.  Reviewed THR, pulse, RPE, sign and symptoms, pulse oximetery and when to call 911 or MD.  Also discussed weather considerations and indoor options.  Pt voiced understanding. Takeem continues to do well in rehab. He continues to work at an overall average MET level above  2.5 METs. He also continues to work at level 1 on the T5 nustep and biostep, and level 2 on the recumbent bike and T4 nustep. We will continue to monitor his progress in the program.   Expected Outcomes Short: Use RPE daily to regulate intensity. Long: Follow program prescription in THR. Short: Continue to follow current exercise prescription. Long: Continue to improve strength and stamina. Short: Slowly increasing treadmill speed Long: Continue to increase overall MET level and stamina Short: Start to add in exercise at home routinely Long: Continue to exercise independently Short: Begin to progressively increase workloads. Long: Continue to improve strength and stamina.    Row Name 05/24/23 0755 06/06/23 1640 06/22/23 1140 07/04/23 1648 07/20/23 1048     Exercise Goal Re-Evaluation   Exercise Goals Review Increase Physical Activity;Increase Strength and Stamina;Understanding of Exercise Prescription Increase Physical Activity;Increase Strength and Stamina;Understanding of Exercise Prescription Increase Physical Activity;Increase Strength and Stamina;Understanding of Exercise Prescription Understanding of Exercise Prescription;Increase Strength and Stamina;Increase Physical Activity Increase Physical Activity;Increase Strength and Stamina;Understanding of Exercise Prescription   Comments Zackory is doing well in rehab.  He is up to level 3 on NuStep.  We will encourage him to increase other machines consistently and continue to monitor his progress. Askia is doing well in rehab. He has improved to level 2 on the biostep and continues to work at level 3 on the T4 nustep. He also increased his overall average MET level to 2.76 METs. He has continued to do well with 3 lb hand weights for resistance training as well. We will continue to monitor his progress in the program. Ismail continues to do well in rehab. He has stayed consistent with his workloads on all of his seated machines and has not done any walking in  rehab. He continues to do well with 3 lb hand weights for resistance training as well. We will continue to monitor his progress in the program. Cyndie Chime remains consistent with his workloads. He has not increased any yet.  will continue to monitor his progress and see if he can increase his workloads to continue working on strength and stamina. Aneudy missed some sessions, attending 2. He did increase his Recumbent BIke to level 2 from level 1. His RPE are at 13 and he continues to use the 3 lb handweights.   Expected Outcomes Short: Increase workload on BioStep and try 4 lb weights Long: Continue to improve stamina Short: Continue to progressively increase workloads and try 4 lb hand weights. Long: Continue to improve strength and stamina. Short: Try level 2 on the recumbent bike, and level 3 on the biostep. Long: Continue to improve strength and stamina. Short: Try level 2 on the recumbent bike, and level 3 on the biostep. Long: Continue to follow exercise progression to  improve strength and stamina. STG COntinue exercise progression as tolerated. LTG Continued exercise progression as tolerated during program and after discharge.    Row Name 08/01/23 1444 08/15/23 1040 08/24/23  4332 08/30/23 1358       Exercise Goal Re-Evaluation   Exercise Goals Review Increase Physical Activity;Increase Strength and Stamina;Understanding of Exercise Prescription Increase Physical Activity;Increase Strength and Stamina;Understanding of Exercise Prescription Increase Physical Activity;Increase Strength and Stamina;Understanding of Exercise Prescription Increase Physical Activity;Increase Strength and Stamina;Understanding of Exercise Prescription    Comments Bodie continues to do well in rehab. He has kept his workloads on the T4 nustep, T5 nustep, and recumbent bike consistent. He also is due for his post and will look to improve on it. We will continue to monitor his progress in the program. Tylerjames continues to do well  in rehab. He recently completed his and improved by 37.1%. He also increased his Recumbent bike resistance from level 2 to level 3. We will continue to monitor his progress in the program. Wilkin states that he has been doing well with exercise. He states that on the days when he is not at rehab he has been using a seated aerobic machine at home. He states that since he has been exercising he feels he has a little more stamina and his breathing has improved. He states that he plans to continue to use his aerobic machine at home as well as do some walking for exercise once he graduates from the program. Rondrick continues to do well in rehab and graduates today. He plans to continue exercising independently at home and incorporate walking for exercise.    Expected Outcomes Short: Improve on post . Long: Continue exercise to improve strength and stamina. Short: Graduate. Long: Continue to exercise independently. Short: Walking and use seated aerobic machines at home. Long: Continue to exercise independently. Short: Graduate and incorporate walking into his exercise routine. Long: Continue to exercise independently.             Discharge Exercise Prescription (Final Exercise Prescription Changes):  Exercise Prescription Changes - 08/30/23 1300       Response to Exercise   Blood Pressure (Admit) 122/60    Blood Pressure (Exit) 112/64    Heart Rate (Admit) 79 bpm    Heart Rate (Exercise) 94 bpm    Heart Rate (Exit) 88 bpm    Oxygen Saturation (Admit) 92 %    Oxygen Saturation (Exercise) 86 %    Oxygen Saturation (Exit) 88 %    Rating of Perceived Exertion (Exercise) 13    Perceived Dyspnea (Exercise) 2    Symptoms SOB    Duration Continue with 30 min of aerobic exercise without signs/symptoms of physical distress.    Intensity THRR unchanged      Progression   Progression Continue to progress workloads to maintain intensity without signs/symptoms of physical distress.    Average METs  2.66      Resistance Training   Training Prescription Yes    Weight 3 lb    Reps 10-15      Interval Training   Interval Training No      Recumbant Bike   Level 3    Watts 22    Minutes 15    METs 2.74      T5 Nustep   Level 1    Minutes 15    METs 2.1      Biostep-RELP   Level 2    Minutes 15    METs 5      Home Exercise Plan   Plans to continue exercise at Home (comment)   Elcipse Machine   Frequency Add 2 additional days to program  exercise sessions.    Initial Home Exercises Provided 05/04/23      Oxygen   Maintain Oxygen Saturation 88% or higher             Nutrition:  Target Goals: Understanding of nutrition guidelines, daily intake of sodium 1500mg , cholesterol 200mg , calories 30% from fat and 7% or less from saturated fats, daily to have 5 or more servings of fruits and vegetables.  Education: All About Nutrition: -Group instruction provided by verbal, written material, interactive activities, discussions, models, and posters to present general guidelines for heart healthy nutrition including fat, fiber, MyPlate, the role of sodium in heart healthy nutrition, utilization of the nutrition label, and utilization of this knowledge for meal planning. Follow up email sent as well. Written material given at graduation. Flowsheet Row Pulmonary Rehab from 05/11/2023 in [facility]  Date --  [part 2]       Biometrics:  Pre Biometrics - 04/06/23 1009       Pre Biometrics   Waist Circumference 32 inches    Hip Circumference 43 inches    Waist to Hip Ratio 0.74 %             Post Biometrics - 08/10/23 1110        Post  Biometrics   Height 6' (1.829 m)    Weight 204 lb 1.6 oz (92.6 kg)    BMI (Calculated) 27.67             Nutrition Therapy Plan and Nutrition Goals:  Nutrition Therapy & Goals - 05/04/23 1042       Nutrition Therapy   RD appointment deferred Yes             Nutrition Assessments:  MEDIFICTS  Score Key: ?70 Need to make dietary changes  40-70 Heart Healthy Diet ? 40 Therapeutic Level Cholesterol Diet  Flowsheet Row Pulmonary Rehab from 08/12/2023 in [facility]  Picture Your Plate Total Score on Admission 55  Picture Your Plate Total Score on Discharge 54      Picture Your Plate Scores: <08 Unhealthy dietary pattern with much room for improvement. 41-50 Dietary pattern unlikely to meet recommendations for good health and room for improvement. 51-60 More healthful dietary pattern, with some room for improvement.  >60 Healthy dietary pattern, although there may be some specific behaviors that could be improved.   Nutrition Goals Re-Evaluation:  Nutrition Goals Re-Evaluation     Row Name 05/04/23 1041 05/27/23 1031 08/24/23 1004         Goals   Current Weight -- 208 lb (94.3 kg) 205 lb 3.2 oz (93.1 kg)     Nutrition Goal -- Eat smaller meals --     Comment Wilhelm's family does most of the cooking for he and his bed bound wife. He mentions he eats well and has no nutrition concerns. He does not want to meet with the dietician at this time. He states his appetite is good and he feels like his needs are being met Patient was informed on why it is important to maintain a balanced diet when dealing with Respiratory issues. Explained that it takes a lot of energy to breath and when they are short of breath often they will need to have a good diet to help keep up with the calories they are expending for breathing. Srinath states that he has been doing well with his diet. He states that he does feel worn out after a big  meal and is still working on portion control. He has been eating smaller meals and staying concious about eating healthier foods.     Expected Outcome Short: continue maitenance with diet  Long: continue to follow a healthy pulmonary diet. Short: Choose and plan snacks accordingly to patients caloric intake to improve breathing. Long: Maintain a diet  independently that meets their caloric intake to aid in daily shortness of breath. Short: Continue to work on portion control. Long: Continue to work towards healthy dietary patterns.              Nutrition Goals Discharge (Final Nutrition Goals Re-Evaluation):  Nutrition Goals Re-Evaluation - 08/24/23 1004       Goals   Current Weight 205 lb 3.2 oz (93.1 kg)    Comment Abdurraheem states that he has been doing well with his diet. He states that he does feel worn out after a big meal and is still working on portion control. He has been eating smaller meals and staying concious about eating healthier foods.    Expected Outcome Short: Continue to work on portion control. Long: Continue to work towards healthy dietary patterns.             Psychosocial: Target Goals: Acknowledge presence or absence of significant depression and/or stress, maximize coping skills, provide positive support system. Participant is able to verbalize types and ability to use techniques and skills needed for reducing stress and depression.   Education: Stress, Anxiety, and Depression - Group verbal and visual presentation to define topics covered.  Reviews how body is impacted by stress, anxiety, and depression.  Also discusses healthy ways to reduce stress and to treat/manage anxiety and depression.  Written material given at graduation.   Education: Sleep Hygiene -Provides group verbal and written instruction about how sleep can affect your health.  Define sleep hygiene, discuss sleep cycles and impact of sleep habits. Review good sleep hygiene tips.    Initial Review & Psychosocial Screening:  Initial Psych Review & Screening - 03/21/23 1556       Initial Review   Current issues with None Identified      Family Dynamics   Good Support System? Yes   daughter, Rosey Bath     Barriers   Psychosocial barriers to participate in program There are no identifiable barriers or psychosocial needs.      Screening  Interventions   Interventions Encouraged to exercise;To provide support and resources with identified psychosocial needs;Provide feedback about the scores to participant    Expected Outcomes Short Term goal: Utilizing psychosocial counselor, staff and physician to assist with identification of specific Stressors or current issues interfering with healing process. Setting desired goal for each stressor or current issue identified.;Long Term Goal: Stressors or current issues are controlled or eliminated.;Short Term goal: Identification and review with participant of any Quality of Life or Depression concerns found by scoring the questionnaire.;Long Term goal: The participant improves quality of Life and PHQ9 Scores as seen by post scores and/or verbalization of changes             Quality of Life Scores:  Scores of 19 and below usually indicate a poorer quality of life in these areas.  A difference of  2-3 points is a clinically meaningful difference.  A difference of 2-3 points in the total score of the Quality of Life Index has been associated with significant improvement in overall quality of life, self-image, physical symptoms, and general health in studies assessing change in quality  of life.  PHQ-9: Review Flowsheet  More data exists      08/12/2023 04/04/2023 01/24/2023 10/26/2022 10/21/2021  Depression screen PHQ 2/9  Decreased Interest 0 0 0 0 0  Down, Depressed, Hopeless 0 0 1 0 0  PHQ - 2 Score 0 0 1 0 0  Altered sleeping 1 0 2 0 0  Tired, decreased energy 3 3 3 3 1   Change in appetite 1 1 1  0 0  Feeling bad or failure about yourself  0 0 0 0 0  Trouble concentrating 0 0 1 0 0  Moving slowly or fidgety/restless 0 0 0 0 0  Suicidal thoughts 0 0 0 0 0  PHQ-9 Score 5 4 8 3 1   Difficult doing work/chores Somewhat difficult Not difficult at all Not difficult at all Not difficult at all Not difficult at all    Details           Interpretation of Total Score  Total Score  Depression Severity:  1-4 = Minimal depression, 5-9 = Mild depression, 10-14 = Moderate depression, 15-19 = Moderately severe depression, 20-27 = Severe depression   Psychosocial Evaluation and Intervention:  Psychosocial Evaluation - 03/21/23 1608       Psychosocial Evaluation & Interventions   Interventions Encouraged to exercise with the program and follow exercise prescription    Comments Murry has no barriers to attending the program. He wants to leave the program breathing better. He has support from his daughter Rosey Bath. He has the desire to breath easier . He is ready to start the program    Expected Outcomes STG Kaysean is able to attend all scheduled sessions, he is able to progress with his exercise prescription and work on breathing techniques to improve his day to day breathing with his daily activities.  LTG Renel continues to work on his exerise ad breathing techniques    Continue Psychosocial Services  Follow up required by staff             Psychosocial Re-Evaluation:  Psychosocial Re-Evaluation     Row Name 05/04/23 1027 05/27/23 1032 08/24/23 1002         Psychosocial Re-Evaluation   Current issues with Current Stress Concerns;Current Sleep Concerns Current Stress Concerns --     Comments Huber reports his biggest stressor is managing his wife's care with Alzheimers. She has had it for a few years and is now bed bound. He has his daughter and aides helping him out. They help with shopping and some chores as well as stay the night with his wife. He worries about his wife and mentions he knows he needs to take care of himself as well. He reports he thinks he sleeps very well, however his doctor is sending him for a sleep study. They are a few months out on the schedule, so he doesn't know when he will go back for the actual study. He stays busy with his doctors appointments and caring for his wife. He does know that this program is good for him because it is making him take  time for himself. Patient reports no changes with their current mental states, sleep, stress, depression or anxiety. Will follow up with patient in a few weeks for any changes. Patient reports no changes with their current mental states, depression or anxiety. He also states that he is sleeping well at this time. He also states that his daughter has been a good support for him as well. Will follow up with patient  in a few weeks for any changes.     Expected Outcomes Short: attend pulmonary rehab consistently for exercise and education on breathing techniques and lifestyle changes. Long: independently maintain positive self care habits. Short: Continue to exercise regularly to support mental health and notify staff of any changes. Long: maintain mental health and well being through teaching of rehab or prescribed medications independently. Short: Continue to exercise for mental boost. Long: Continue to maintain positive outlook.     Interventions Stress management education Encouraged to attend Pulmonary Rehabilitation for the exercise Encouraged to attend Pulmonary Rehabilitation for the exercise     Continue Psychosocial Services  Follow up required by staff Follow up required by staff Follow up required by staff              Psychosocial Discharge (Final Psychosocial Re-Evaluation):  Psychosocial Re-Evaluation - 08/24/23 1002       Psychosocial Re-Evaluation   Comments Patient reports no changes with their current mental states, depression or anxiety. He also states that he is sleeping well at this time. He also states that his daughter has been a good support for him as well. Will follow up with patient in a few weeks for any changes.    Expected Outcomes Short: Continue to exercise for mental boost. Long: Continue to maintain positive outlook.    Interventions Encouraged to attend Pulmonary Rehabilitation for the exercise    Continue Psychosocial Services  Follow up required by staff              Education: Education Goals: Education classes will be provided on a weekly basis, covering required topics. Participant will state understanding/return demonstration of topics presented.  Learning Barriers/Preferences:   General Pulmonary Education Topics:  Infection Prevention: - Provides verbal and written material to individual with discussion of infection control including proper hand washing and proper equipment cleaning during exercise session. Flowsheet Row Pulmonary Rehab from 05/11/2023 in [facility]  Date 04/04/23  Educator [facility]  Instruction Review Code 1- Verbalizes Understanding       Falls Prevention: - Provides verbal and written material to individual with discussion of falls prevention and safety. Flowsheet Row Pulmonary Rehab from 05/11/2023 in [facility] Pulmonary Rehab  Date 03/21/23  Educator SB  Instruction Review Code 1- Verbalizes Understanding       Chronic Lung Disease Review: - Group verbal instruction with posters, models, PowerPoint presentations and videos,  to review new updates, new respiratory medications, new advancements in procedures and treatments. Providing information on websites and "800" numbers for continued self-education. Includes information about supplement oxygen, available portable oxygen systems, continuous and intermittent flow rates, oxygen safety, concentrators, and Medicare reimbursement for oxygen. Explanation of Pulmonary Drugs, including class, frequency, complications, importance of spacers, rinsing mouth after steroid MDI's, and proper cleaning methods for nebulizers. Review of basic lung anatomy and physiology related to function, structure, and complications of lung disease. Review of risk factors. Discussion about methods for diagnosing sleep apnea and types of masks and machines for OSA. Includes a review of the use of types of environmental controls: home humidity, furnaces, filters, dust  mite/pet prevention, HEPA vacuums. Discussion about weather changes, air quality and the benefits of nasal washing. Instruction on Warning signs, infection symptoms, calling MD promptly, preventive modes, and value of vaccinations. Review of effective airway clearance, coughing and/or vibration techniques. Emphasizing that all should Create an Action Plan. Written material given at graduation. Flowsheet Row Pulmonary Rehab from 05/11/2023 in [facility]  and Pulmonary Rehab  Education need identified 04/04/23  Date 05/11/23  Educator [facility]  Instruction Review Code 1- Verbalizes Understanding       AED/CPR: - Group verbal and written instruction with the use of models to demonstrate the basic use of the AED with the basic ABC's of resuscitation.    Anatomy and Cardiac Procedures: - Group verbal and visual presentation and models provide information about basic cardiac anatomy and function. Reviews the testing methods done to diagnose heart disease and the outcomes of the test results. Describes the treatment choices: Medical Management, Angioplasty, or Coronary Bypass Surgery for treating various heart conditions including Myocardial Infarction, Angina, Valve Disease, and Cardiac Arrhythmias.  Written material given at graduation.   Medication Safety: - Group verbal and visual instruction to review commonly prescribed medications for heart and lung disease. Reviews the medication, class of the drug, and side effects. Includes the steps to properly store meds and maintain the prescription regimen.  Written material given at graduation.   Other: -Provides group and verbal instruction on various topics (see comments)   Knowledge Questionnaire Score:  Knowledge Questionnaire Score - 08/12/23 1040       Knowledge Questionnaire Score   Pre Score 16/18    Post Score 16/18              Core Components/Risk Factors/Patient Goals at Admission:  Personal Goals and Risk Factors at Admission  - 04/04/23 1541       Core Components/Risk Factors/Patient Goals on Admission    Weight Management Yes;Weight Loss    Intervention Weight Management: Develop a combined nutrition and exercise program designed to reach desired caloric intake, while maintaining appropriate intake of nutrient and fiber, sodium and fats, and appropriate energy expenditure required for the weight goal.;Weight Management: Provide education and appropriate resources to help participant work on and attain dietary goals.;Weight Management/Obesity: Establish reasonable short term and long term weight goals.    Admit Weight 210 lb 1.6 oz (95.3 kg)    Goal Weight: Short Term 205 lb (93 kg)    Goal Weight: Long Term 200 lb (90.7 kg)    Expected Outcomes Short Term: Continue to assess and modify interventions until short term weight is achieved;Long Term: Adherence to nutrition and physical activity/exercise program aimed toward attainment of established weight goal;Weight Loss: Understanding of general recommendations for a balanced deficit meal plan, which promotes 1-2 lb weight loss per week and includes a negative energy balance of [id number] kcal/d;Understanding recommendations for meals to include 15-35% energy as protein, 25-35% energy from fat, 35-60% energy from carbohydrates, less than 200mg  of dietary cholesterol, 20-35 gm of total fiber daily;Understanding of distribution of calorie intake throughout the day with the consumption of 4-5 meals/snacks    Improve shortness of breath with ADL's Yes    Intervention Provide education, individualized exercise plan and daily activity instruction to help decrease symptoms of SOB with activities of daily living.    Expected Outcomes Short Term: Improve cardiorespiratory fitness to achieve a reduction of symptoms when performing ADLs;Long Term: Be able to perform more ADLs without symptoms or delay the onset of symptoms    Increase knowledge of respiratory medications and ability to  use respiratory devices properly  Yes    Intervention Provide education and demonstration as needed of appropriate use of medications, inhalers, and oxygen therapy.    Expected Outcomes Short Term: Achieves understanding of medications use. Understands that oxygen is a medication prescribed by physician. Demonstrates appropriate use of inhaler and  oxygen therapy.;Long Term: Maintain appropriate use of medications, inhalers, and oxygen therapy.    Hypertension Yes    Intervention Provide education on lifestyle modifcations including regular physical activity/exercise, weight management, moderate sodium restriction and increased consumption of fresh fruit, vegetables, and low fat dairy, alcohol moderation, and smoking cessation.;Monitor prescription use compliance.    Expected Outcomes Short Term: Continued assessment and intervention until BP is < 140/79mm HG in hypertensive participants. < 130/63mm HG in hypertensive participants with diabetes, heart failure or chronic kidney disease.;Long Term: Maintenance of blood pressure at goal levels.    Lipids Yes    Intervention Provide education and support for participant on nutrition & aerobic/resistive exercise along with prescribed medications to achieve LDL 70mg , HDL >40mg .    Expected Outcomes Short Term: Participant states understanding of desired cholesterol values and is compliant with medications prescribed. Participant is following exercise prescription and nutrition guidelines.;Long Term: Cholesterol controlled with medications as prescribed, with individualized exercise RX and with personalized nutrition plan. Value goals: LDL < 70mg , HDL > 40 mg.             Education:Diabetes - Individual verbal and written instruction to review signs/symptoms of diabetes, desired ranges of glucose level fasting, after meals and with exercise. Acknowledge that pre and post exercise glucose checks will be done for 3 sessions at entry of program.   Know  Your Numbers and Heart Failure: - Group verbal and visual instruction to discuss disease risk factors for cardiac and pulmonary disease and treatment options.  Reviews associated critical values for Overweight/Obesity, Hypertension, Cholesterol, and Diabetes.  Discusses basics of heart failure: signs/symptoms and treatments.  Introduces Heart Failure Zone chart for action plan for heart failure.  Written material given at graduation.   Core Components/Risk Factors/Patient Goals Review:   Goals and Risk Factor Review     Row Name 05/04/23 1021 05/27/23 1030 08/24/23 1007         Core Components/Risk Factors/Patient Goals Review   Personal Goals Review Improve shortness of breath with ADL's;Hypertension Improve shortness of breath with ADL's Improve shortness of breath with ADL's;Weight Management/Obesity     Review Hutchinson reports feeling at his baseline. He hasn't seen any improvemenet since starting the program, but he is motivated to continue coming and is optimistic to see what his progress will be. His blood pressure has been stable so far in the program. He does not check it regularly at home. He does have a pulse ox which he uses sometimes. He has a lot of help at home with shopping and chores, but he does note that he has a hard time doing some things around the house because of his breathing. He tries to remember to do his PLB and rest as needed. He had a knee replacement during COVID and couldn't go to rehab, so he has become more sedentary which he states that his breathing has gotten worse since then. He is trying to be more active by coming to the program. Spoke to patient about their shortness of breath and what they can do to improve. Patient has been informed of breathing techniques when starting the program. Patient is informed to tell staff if they have had any med changes and that certain meds they are taking or not taking can be causing shortness of breath. Hulbert states that he is doing  well with his weight and is comfortable with where he is at this time. Marland is still experiencing quite a bit of SOB with daily activities.  He has been practicing PLB techniques and taking all his medications as prescribed.     Expected Outcomes Short: attend pulmonary rehab to work on his shortness of breath with ADLs. Long: independently manage breathing techniques Short: Attend [facility] regularly to improve shortness of breath with ADL's. Long: maintain independence with ADL's Short: Continue to maintain weight through diet and exercise. Long: maintain independence with ADL's.              Core Components/Risk Factors/Patient Goals at Discharge (Final Review):   Goals and Risk Factor Review - 08/24/23 1007       Core Components/Risk Factors/Patient Goals Review   Personal Goals Review Improve shortness of breath with ADL's;Weight Management/Obesity    Review Marchel states that he is doing well with his weight and is comfortable with where he is at this time. Shaheim is still experiencing quite a bit of SOB with daily activities. He has been practicing PLB techniques and taking all his medications as prescribed.    Expected Outcomes Short: Continue to maintain weight through diet and exercise. Long: maintain independence with ADL's.             ITP Comments:  ITP Comments     Row Name 03/21/23 1614 04/04/23 1533 04/06/23 1002 04/13/23 1326 05/11/23 0919   ITP Comments Virtual orientation call completed today. he has an appointment on Date: 04/04/2023  for EP eval and gym Orientation.  Documentation of diagnosis [facility] be found in [facility] Date: [id number] . Completed and gym orientation. Initial ITP created and sent for review to Dr. Jinny Sanders, Medical Director. First full day of exercise!  Patient was oriented to gym and equipment including functions, settings, policies, and procedures.  Patient's individual exercise prescription and treatment plan were reviewed.  All starting workloads  were established based on the results of the 6 minute walk test done at initial orientation visit.  The plan for exercise progression was also introduced and progression will be customized based on patient's performance and goals. 30 day review completed. ITP sent to Dr. Jinny Sanders, Medical Director of  Pulmonary Rehab. Continue with ITP unless changes are made by physician. 30 Day review completed. Medical Director ITP review done, changes made as directed, and signed approval by Medical Director.    Row Name 06/08/23 1103 07/05/23 1440 08/03/23 1154 08/31/23 1028     ITP Comments 30 Day review completed. Medical Director ITP review done, changes made as directed, and signed approval by Medical Director. 30 Day review completed. Medical Director ITP review done, changes made as directed, and signed approval by Medical Director. 30 Day review completed. Medical Director ITP review done, changes made as directed, and signed approval by Medical Director. Samiir graduated today from  rehab with 36 sessions completed.  Details of the patient's exercise prescription and what He needs to do in order to continue the prescription and progress were discussed with patient.  Patient was given a copy of prescription and goals.  Patient verbalized understanding. Deaken plans to continue to exercise by walking.             Comments: Discharge ITP

## 2023-09-07 ENCOUNTER — Ambulatory Visit (INDEPENDENT_AMBULATORY_CARE_PROVIDER_SITE_OTHER): Payer: Medicare HMO

## 2023-09-07 VITALS — BP 118/66 | Ht 72.0 in | Wt 208.7 lb

## 2023-09-07 DIAGNOSIS — Z23 Encounter for immunization: Secondary | ICD-10-CM

## 2023-09-07 DIAGNOSIS — Z0000 Encounter for general adult medical examination without abnormal findings: Secondary | ICD-10-CM

## 2023-09-07 NOTE — Progress Notes (Signed)
Subjective:   Daniel Carr is a 88 y.o. male who presents for Medicare Annual/Subsequent preventive examination.  Visit Complete: Virtual  I connected with  Daniel Carr on 09/07/23 by a audio enabled telemedicine application and verified that I am speaking with the correct person using two identifiers.  Patient Location: Home  Provider Location: Office/Clinic  I discussed the limitations of evaluation and management by telemedicine. The patient expressed understanding and agreed to proceed.  Patient Medicare AWV questionnaire was completed by the patient on (not done); I have confirmed that all information answered by patient is correct and no changes since this date.  Review of Systems    Cardiac Risk Factors include: advanced age (>67men, >79 women);hypertension;male gender;sedentary lifestyle    Objective:    Today's Vitals   09/07/23 0925  BP: 118/66  Weight: 208 lb 11.2 oz (94.7 kg)  Height: 6' (1.829 m)  PainSc: 5    Body mass index is 28.3 kg/m.     09/07/2023    9:43 AM 04/29/2023   11:24 AM 03/21/2023    3:39 PM 09/21/2021   12:44 PM 12/24/2020   10:29 PM 12/16/2020    9:50 AM 09/12/2020   10:58 AM  Advanced Directives  Does Patient Have a Medical Advance Directive? No No No No No No No  Would patient like information on creating a medical advance directive?  No - Patient declined No - Patient declined No - Patient declined No - Patient declined No - Patient declined No - Patient declined    Current Medications (verified) Outpatient Encounter Medications as of 09/07/2023  Medication Sig   amLODipine (NORVASC) 2.5 MG tablet Take 1 tablet by mouth once daily   aspirin 81 MG EC tablet Take 1 tablet (81 mg total) by mouth daily. Swallow whole.   azelastine (ASTELIN) 0.1 % nasal spray Place 1 spray into the nose daily as needed (allergies).   brimonidine (ALPHAGAN) 0.2 % ophthalmic solution Place 1 drop into both eyes in the morning and at bedtime.   dorzolamide  (TRUSOPT) 2 % ophthalmic solution Apply to eye.   dutasteride (AVODART) 0.5 MG capsule Take 1 capsule by mouth once daily   hydrochlorothiazide (HYDRODIURIL) 25 MG tablet TAKE ONE-HALF TABLET BY MOUTH ONCE DAILY   ipratropium (ATROVENT) 0.06 % nasal spray Place 1 spray into both nostrils 2 (two) times daily as needed for rhinitis.   latanoprost (XALATAN) 0.005 % ophthalmic solution SMARTSIG:In Eye(s)   levocetirizine (XYZAL) 5 MG tablet Take 5 mg by mouth every evening.   levothyroxine (SYNTHROID) 150 MCG tablet Take 1 tablet by mouth once daily   lovastatin (MEVACOR) 40 MG tablet Take 1 tablet by mouth once daily   metoprolol tartrate (LOPRESSOR) 100 MG tablet Take tablet TWO hours prior to his cardiac CT scan.   Multiple Vitamin (DAILY VITAMIN PO) Take 1 tablet by mouth daily.   vitamin B-12 (CYANOCOBALAMIN) 100 MCG tablet Take 100 mcg by mouth daily. Unsure of dose  is OTC med   clotrimazole-betamethasone (LOTRISONE) cream APPLY TOPICALLY ONCE DAILY (Patient not taking: Reported on 03/21/2023)   COD LIVER OIL PO Take 1 capsule by mouth daily. (Patient not taking: Reported on 03/21/2023)   enalapril-hydrochlorothiazide (VASERETIC) 10-25 MG tablet hydroCHLOROthiazide 25mg  (Patient not taking: Reported on 03/21/2023)   naproxen (NAPROSYN) 500 MG tablet Take 1 tablet (500 mg total) by mouth 2 (two) times daily with a meal. (Patient not taking: Reported on 03/21/2023)   predniSONE (DELTASONE) 5 MG tablet Take by mouth. (Patient  not taking: Reported on 04/29/2023)   traMADol (ULTRAM) 50 MG tablet Take 50 mg by mouth every 6 (six) hours as needed. (Patient not taking: Reported on 09/07/2023)   No facility-administered encounter medications on file as of 09/07/2023.    Allergies (verified) Patient has no known allergies.   History: Past Medical History:  Diagnosis Date   Arthritis    childhood infection    chicken pox, measels, and mumps   Glaucoma    Hypertension    Hypothyroidism    Sinus  problem    Ulnar neuropathy    bilateral   Past Surgical History:  Procedure Laterality Date   APPENDECTOMY     CATARACT EXTRACTION Bilateral    COLONOSCOPY     JOINT REPLACEMENT Right    KNEE ARTHROPLASTY Left 12/24/2020   Procedure: COMPUTER ASSISTED TOTAL KNEE ARTHROPLASTY;  Surgeon: Donato Heinz, MD;  Location: [facility] ORS;  Service: Orthopedics;  Laterality: Left;  3RD CASE   KNEE SURGERY Right 10/04/2011   replacement   NM MYOVIEW ([facility] HX)  07/21/2017   KC. No ischemia.    RETINAL DETACHMENT REPAIR W/ SCLERAL BUCKLE LE     TEE WITHOUT CARDIOVERSION N/A 04/29/2023   Procedure: TRANSESOPHAGEAL ECHOCARDIOGRAM (TEE);  Surgeon: Tiajuana Amass, MD;  Location: [facility] ORS;  Service: Cardiovascular;  Laterality: N/A;   TONSILLECTOMY     Family History  Problem Relation Age of Onset   Stroke Sister    Hypertension Mother    Stroke Father    Hypertension Father    Social History   Socioeconomic History   Marital status: Married    Spouse name: Not on file   Number of children: 1   Years of education: college   Highest education level: Not on file  Occupational History   Occupation: Retired  Tobacco Use   Smoking status: Former    Current packs/day: 0.00    Average packs/day: 0.3 packs/day for 6.0 years (1.5 ttl pk-yrs)    Types: Cigarettes    Start date: 11/14/1963    Quit date: 11/13/1969    Years since quitting: 53.8   Smokeless tobacco: Never   Tobacco comments:    quit in 1970's  Vaping Use   Vaping status: Never Used  Substance and Sexual Activity   Alcohol use: No    Alcohol/week: 0.0 standard drinks of alcohol   Drug use: No   Sexual activity: Yes  Other Topics Concern   Not on file  Social History Narrative   Not on file   Social Determinants of Health   Financial Resource Strain: Low Risk  (09/07/2023)   Overall Financial Resource Strain (CARDIA)    Difficulty of Paying Living Expenses: Not hard at all  Food Insecurity: No Food Insecurity (09/07/2023)    Hunger Vital Sign    Worried About Running Out of Food in the Last Year: Never true    Ran Out of Food in the Last Year: Never true  Transportation Needs: No Transportation Needs (09/07/2023)   PRAPARE - Administrator, Civil Service (Medical): No    Lack of Transportation (Non-Medical): No  Physical Activity: Inactive (09/07/2023)   Exercise Vital Sign    Days of Exercise per Week: 0 days    Minutes of Exercise per Session: 0 min  Stress: No Stress Concern Present (09/07/2023)   [employer] of Occupational Health - Occupational Stress Questionnaire    Feeling of Stress : Not at all  Social Connections: Moderately Integrated (09/07/2023)  Social Connection and Isolation Panel [NHANES]    Frequency of Communication with Friends and Family: Twice a week    Frequency of Social Gatherings with Friends and Family: Three times a week    Attends Religious Services: 1 to 4 times per year    Active Member of Clubs or Organizations: No    Attends Banker Meetings: Never    Marital Status: Married    Tobacco Counseling Counseling given: Not Answered Tobacco comments: quit in 1970's  Clinical Intake:  Pre-visit preparation completed: Yes  Pain : 0-10 Pain Score: 5  Pain Type: Chronic pain Pain Location: Elbow (both) Pain Descriptors / Indicators: Aching Pain Onset: More than a month ago Pain Frequency: Constant Pain Relieving Factors: heating pad  Pain Relieving Factors: heating pad  BMI - recorded: 28.3 Nutritional Status: BMI 25 -29 Overweight Nutritional Risks: None Diabetes: No  How often do you need to have someone help you when you read instructions, pamphlets, or other written materials from your doctor or pharmacy?: 1 - Never  Interpreter Needed?: No  Comments: others live pt Information entered by :: B.Marlicia Sroka,LPN   Activities of Daily Living    09/07/2023    9:43 AM 04/29/2023   11:23 AM  In your present state of health, do you  have any difficulty performing the following activities:  Hearing? 0 0  Vision? 0 1  Difficulty concentrating or making decisions? 1 0  Walking or climbing stairs? 1 1  Dressing or bathing? 0 0  Doing errands, shopping? 1   Preparing Food and eating ? N   Using the Toilet? N   In the past six months, have you accidently leaked urine? N   Do you have problems with loss of bowel control? N   Managing your Medications? N   Managing your Finances? N   Housekeeping or managing your Housekeeping? N     Patient Care Team: Malva Limes, MD as PCP - General (Family Medicine) Dingeldein, Viviann Spare, MD as Consulting Physician (Ophthalmology)  Indicate any recent Medical Services you may have received from other than [facility] providers in the past year (date may be approximate).     Assessment:   This is a routine wellness examination for [employer].  Hearing/Vision screen Hearing Screening - Comments:: Adequate hearing Vision Screening - Comments:: Adequate vision Dr. Dellie Burns   Goals Addressed             This Visit's Progress    COMPLETED: Increase water intake   On track    Recommend increasing water intake to 4-6 glasses a day.     Patient Stated       I would like to be able to breathe better and decreased pain in elbows       Depression Screen    09/07/2023    9:38 AM 08/12/2023   10:39 AM 04/04/2023    3:42 PM 01/24/2023    4:12 PM 10/26/2022    9:06 AM 10/21/2021   10:29 AM 11/13/2020    2:26 PM  PHQ 2/9 Scores  PHQ - 2 Score 0 0 0 1 0 0 0  PHQ- 9 Score  5 4 8 3 1      Fall Risk    09/07/2023    9:28 AM 03/21/2023    3:39 PM 01/24/2023    4:12 PM 10/26/2022    9:06 AM 10/21/2021   10:29 AM  Fall Risk   Falls in the past year? 0 0 0 0 0  Number falls in past yr: 0 0 0 0 0  Injury with Fall? 0 0 0 0 0  Risk for fall due to : No Fall Risks Medication side effect  No Fall Risks No Fall Risks  Follow up Education provided;Falls prevention discussed Falls prevention  discussed;Education provided;Falls evaluation completed  Falls evaluation completed Falls evaluation completed    MEDICARE RISK AT HOME: Medicare Risk at Home Any stairs in or around the home?: Yes If so, are there any without handrails?: Yes Home free of loose throw rugs in walkways, pet beds, electrical cords, etc?: Yes Adequate lighting in your home to reduce risk of falls?: Yes Life alert?: No Use of a cane, walker or w/c?: Yes Grab bars in the bathroom?: Yes Shower chair or bench in shower?: Yes Elevated toilet seat or a handicapped toilet?: Yes  TIMED UP AND GO:  Was the test performed?  No    Cognitive Function:        09/07/2023    9:49 AM 10/21/2021   10:18 AM 08/25/2020    2:10 PM 06/08/2017    1:49 PM  6CIT Screen  What Year? 0 points 0 points 0 points 0 points  What month? 0 points 0 points 0 points 0 points  What time? 0 points 0 points 0 points 0 points  Count back from 20 0 points 0 points 0 points 0 points  Months in reverse 0 points 0 points 0 points 0 points  Repeat phrase 0 points 0 points 0 points 0 points  Total Score 0 points 0 points 0 points 0 points    Immunizations Immunization History  Administered Date(s) Administered   Fluad Quad(high Dose 65+) 10/23/2019, 10/01/2020, 10/21/2021, 10/26/2022   Influenza, High Dose Seasonal PF 10/07/2015, 10/16/2016, 09/02/2017, 09/19/2018   Influenza-Unspecified 09/19/2014   PFIZER(Purple Top)SARS-COV-2 Vaccination 01/16/2020, 02/09/2020, 09/30/2020   Pneumococcal Conjugate-13 06/03/2014   Pneumococcal Polysaccharide-23 04/16/2003, 08/19/2016, 07/28/2017, 11/08/2019, 09/30/2020   Td 03/04/1997   Tdap 11/09/2011   Zoster, Live 05/09/2008, 06/03/2014    TDAP status: Up to date  Flu Vaccine status: Completed at today's visit  Pneumococcal vaccine status: Up to date  Covid-19 vaccine status: Completed vaccines  Qualifies for Shingles Vaccine? Yes   Zostavax completed Yes  #1 Shingrix Completed?: No.     Education has been provided regarding the importance of this vaccine. Patient has been advised to call insurance company to determine out of pocket expense if they have not yet received this vaccine. Advised may also receive vaccine at local pharmacy or Health Dept. Verbalized acceptance and understanding.  Screening Tests Health Maintenance  Topic Date Due   Zoster Vaccines- Shingrix (1 of 2) 03/02/1954   DTaP/Tdap/Td (3 - Td or Tdap) 11/08/2021   INFLUENZA VACCINE  07/28/2023   COVID-19 Vaccine (4 - 2023-24 season) 08/28/2023   Medicare Annual Wellness (AWV)  09/06/2024   Pneumonia Vaccine 71+ Years old  Completed   HPV VACCINES  Aged Out    Health Maintenance  Health Maintenance Due  Topic Date Due   Zoster Vaccines- Shingrix (1 of 2) 03/02/1954   DTaP/Tdap/Td (3 - Td or Tdap) 11/08/2021   INFLUENZA VACCINE  07/28/2023   COVID-19 Vaccine (4 - 2023-24 season) 08/28/2023    Colorectal cancer screening: No longer required.   Lung Cancer Screening: (Low Dose CT Chest recommended if Age 58-80 years, 20 pack-year currently smoking OR have quit w/in 15years.) does not qualify.   Lung Cancer Screening Referral: no  Additional Screening:  Hepatitis  C Screening: does not qualify; Completed yes  Vision Screening: Recommended annual ophthalmology exams for early detection of glaucoma and other disorders of the eye. Is the patient up to date with their annual eye exam?  Yes  Who is the provider or what is the name of the office in which the patient attends annual eye exams? [facility] If pt is not established with a provider, would they like to be referred to a provider to establish care? No .   Dental Screening: Recommended annual dental exams for proper oral hygiene  Diabetic Foot Exam: no  Community Resource Referral / Chronic Care Management: CRR required this visit?  No   CCM required this visit?  No    Plan:     I have personally reviewed and noted the following in  the patient's chart:   Medical and social history Use of alcohol, tobacco or illicit drugs  Current medications and supplements including opioid prescriptions. Patient is not currently taking opioid prescriptions. Functional ability and status Nutritional status Physical activity Advanced directives List of other physicians Hospitalizations, surgeries, and ER visits in previous 12 months Vitals Screenings to include cognitive, depression, and falls Referrals and appointments  In addition, I have reviewed and discussed with patient certain preventive protocols, quality metrics, and best practice recommendations. A written personalized care plan for preventive services as well as general preventive health recommendations were provided to patient.    Sue Lush, LPN   1/61/0960   After Visit Summary: (MyChart) Due to this being a telephonic visit, the after visit summary with patients personalized plan was offered to patient via MyChart   Nurse Notes: The patient states he is doing well and has no concerns or questions at this time.

## 2023-09-07 NOTE — Patient Instructions (Addendum)
Daniel Carr , Thank you for taking time to come for your Medicare Wellness Visit. I appreciate your ongoing commitment to your health goals. Please review the following plan we discussed and let me know if I can assist you in the future.   Referrals/Orders/Follow-Ups/Clinician Recommendations: none  This is a list of the screening recommended for you and due dates:  Health Maintenance  Topic Date Due   Zoster (Shingles) Vaccine (1 of 2) 03/02/1954   DTaP/Tdap/Td vaccine (3 - Td or Tdap) 11/08/2021   Flu Shot  07/28/2023   COVID-19 Vaccine (4 - 2023-24 season) 08/28/2023   Medicare Annual Wellness Visit  09/06/2024   Pneumonia Vaccine  Completed   HPV Vaccine  Aged Out    Advanced directives: (Declined) Advance directive discussed with you today. Even though you declined this today, please call our office should you change your mind, and we can give you the proper paperwork for you to fill out.  Next Medicare Annual Wellness Visit scheduled for next year: Yes 09/10/24 @ 10:45am telephone

## 2023-09-15 ENCOUNTER — Inpatient Hospital Stay: Payer: Medicare HMO | Attending: Internal Medicine

## 2023-09-15 DIAGNOSIS — E871 Hypo-osmolality and hyponatremia: Secondary | ICD-10-CM | POA: Insufficient documentation

## 2023-09-15 DIAGNOSIS — Z87891 Personal history of nicotine dependence: Secondary | ICD-10-CM | POA: Insufficient documentation

## 2023-09-15 DIAGNOSIS — D472 Monoclonal gammopathy: Secondary | ICD-10-CM | POA: Insufficient documentation

## 2023-09-16 ENCOUNTER — Telehealth: Payer: Self-pay

## 2023-09-16 NOTE — Telephone Encounter (Signed)
Pateint requested if Dr. Senaida Lange labs can be drawn at [facility]umatology apt on Mon. Called office requested to email Dr. Theodoro Kalata labs and will also fax

## 2023-09-21 ENCOUNTER — Telehealth: Payer: Self-pay | Admitting: Internal Medicine

## 2023-09-21 NOTE — Telephone Encounter (Signed)
Pt daughter called and wants to know if pt is still supposed to have labs prior to his MD visit, the daughter stated pt just had labs done at the hospital. Please call Rosey Bath back to let her know.

## 2023-09-21 NOTE — Telephone Encounter (Signed)
CBC was done on 09/13/23, CMP was done 09/19/23.  Per last office needs still needs Multiple myeloma and K lights drawn. Dr. B do you want cbc and cmp re-drawn?

## 2023-09-21 NOTE — Progress Notes (Unsigned)
[facility] CONSULT NOTE  Patient Care Team: Malva Limes, MD as PCP - General (Family Medicine) Dingeldein, Viviann Spare, MD as Consulting Physician (Ophthalmology) Earna Coder, MD as Consulting Physician (Internal Medicine)  CHIEF COMPLAINTS/PURPOSE OF CONSULTATION: MGUS   HISTORY OF PRESENTING ILLNESS: Patient ambulating-with a cane. Accompanied by fdaughter  Daniel Carr 88 y.o.  male pleasant patient with history of MGUS is here for follow-up.  Patient denies any worsening bone pain.  Denies any worsening fatigue or nausea vomiting abdominal pain.  Of note patient has recent workup with his rheumatologist noted to have sodium of 129.  Is currently awaiting evaluation with PCP.   Denies any blood in stools or black-colored stools.  Review of Systems  Constitutional:  Negative for chills, diaphoresis, fever, malaise/fatigue and weight loss.  HENT:  Negative for nosebleeds and sore throat.   Eyes:  Negative for double vision.  Respiratory:  Negative for cough, hemoptysis, sputum production, shortness of breath and wheezing.   Cardiovascular:  Negative for chest pain, palpitations, orthopnea and leg swelling.  Gastrointestinal:  Negative for abdominal pain, blood in stool, constipation, diarrhea, heartburn, melena, nausea and vomiting.  Genitourinary:  Negative for dysuria, frequency and urgency.  Musculoskeletal:  Positive for joint pain.  Skin: Negative.  Negative for itching and rash.  Neurological:  Negative for dizziness, tingling, focal weakness, weakness and headaches.  Endo/Heme/Allergies:  Does not bruise/bleed easily.  Psychiatric/Behavioral:  Negative for depression. The patient is not nervous/anxious and does not have insomnia.     MEDICAL HISTORY:  Past Medical History:  Diagnosis Date   Arthritis    childhood infection    chicken pox, measels, and mumps   Glaucoma    Hypertension    Hypothyroidism    Sinus problem    Ulnar neuropathy     bilateral    SURGICAL HISTORY: Past Surgical History:  Procedure Laterality Date   APPENDECTOMY     CATARACT EXTRACTION Bilateral    COLONOSCOPY     JOINT REPLACEMENT Right    KNEE ARTHROPLASTY Left 12/24/2020   Procedure: COMPUTER ASSISTED TOTAL KNEE ARTHROPLASTY;  Surgeon: Donato Heinz, MD;  Location: [facility] ORS;  Service: Orthopedics;  Laterality: Left;  3RD CASE   KNEE SURGERY Right 10/04/2011   replacement   NM MYOVIEW ([facility] HX)  07/21/2017   KC. No ischemia.    RETINAL DETACHMENT REPAIR W/ SCLERAL BUCKLE LE     TEE WITHOUT CARDIOVERSION N/A 04/29/2023   Procedure: TRANSESOPHAGEAL ECHOCARDIOGRAM (TEE);  Surgeon: Tiajuana Amass, MD;  Location: [facility] ORS;  Service: Cardiovascular;  Laterality: N/A;   TONSILLECTOMY      SOCIAL HISTORY: Social History   Socioeconomic History   Marital status: Married    Spouse name: Not on file   Number of children: 1   Years of education: college   Highest education level: Not on file  Occupational History   Occupation: Retired  Tobacco Use   Smoking status: Former    Current packs/day: 0.00    Average packs/day: 0.3 packs/day for 6.0 years (1.5 ttl pk-yrs)    Types: Cigarettes    Start date: 11/14/1963    Quit date: 11/13/1969    Years since quitting: 53.8   Smokeless tobacco: Never   Tobacco comments:    quit in 1970's  Vaping Use   Vaping status: Never Used  Substance and Sexual Activity   Alcohol use: No    Alcohol/week: 0.0 standard drinks of alcohol   Drug use: No  Sexual activity: Yes  Other Topics Concern   Not on file  Social History Narrative   Not on file   Social Determinants of Health   Financial Resource Strain: Low Risk  (09/07/2023)   Overall Financial Resource Strain (CARDIA)    Difficulty of Paying Living Expenses: Not hard at all  Food Insecurity: No Food Insecurity (09/07/2023)   Hunger Vital Sign    Worried About Running Out of Food in the Last Year: Never true    Ran Out of Food in the Last  Year: Never true  Transportation Needs: No Transportation Needs (09/07/2023)   PRAPARE - Administrator, Civil Service (Medical): No    Lack of Transportation (Non-Medical): No  Physical Activity: Inactive (09/07/2023)   Exercise Vital Sign    Days of Exercise per Week: 0 days    Minutes of Exercise per Session: 0 min  Stress: No Stress Concern Present (09/07/2023)   [employer] of Occupational Health - Occupational Stress Questionnaire    Feeling of Stress : Not at all  Social Connections: Moderately Integrated (09/07/2023)   Social Connection and Isolation Panel [NHANES]    Frequency of Communication with Friends and Family: Twice a week    Frequency of Social Gatherings with Friends and Family: Three times a week    Attends Religious Services: 1 to 4 times per year    Active Member of Clubs or Organizations: No    Attends Banker Meetings: Never    Marital Status: Married  Catering manager Violence: Not At Risk (09/07/2023)   Humiliation, Afraid, Rape, and Kick questionnaire    Fear of Current or Ex-Partner: No    Emotionally Abused: No    Physically Abused: No    Sexually Abused: No    FAMILY HISTORY: Family History  Problem Relation Age of Onset   Stroke Sister    Hypertension Mother    Stroke Father    Hypertension Father     ALLERGIES:  has No Known Allergies.  MEDICATIONS:  Current Outpatient Medications  Medication Sig Dispense Refill   albuterol (VENTOLIN HFA) 108 (90 Base) MCG/ACT inhaler SMARTSIG:2 Puff(s) By Mouth As Directed     aspirin 81 MG EC tablet Take 1 tablet (81 mg total) by mouth daily. Swallow whole.     azelastine (ASTELIN) 0.1 % nasal spray Place 1 spray into the nose daily as needed (allergies).     brimonidine (ALPHAGAN) 0.2 % ophthalmic solution Place 1 drop into both eyes in the morning and at bedtime.     dorzolamide (TRUSOPT) 2 % ophthalmic solution Apply to eye.     DULoxetine (CYMBALTA) 30 MG capsule Take 30 mg  by mouth daily.     dutasteride (AVODART) 0.5 MG capsule Take 1 capsule by mouth once daily 90 capsule 4   furosemide (LASIX) 20 MG tablet Take 20 mg by mouth daily.     hydrochlorothiazide (HYDRODIURIL) 25 MG tablet TAKE ONE-HALF TABLET BY MOUTH ONCE DAILY 45 tablet 03   hydroxychloroquine (PLAQUENIL) 200 MG tablet Take 200 mg by mouth daily.     ipratropium (ATROVENT) 0.06 % nasal spray Place 1 spray into both nostrils 2 (two) times daily as needed for rhinitis.  6   latanoprost (XALATAN) 0.005 % ophthalmic solution SMARTSIG:In Eye(s)     levocetirizine (XYZAL) 5 MG tablet Take 5 mg by mouth every evening.     levothyroxine (SYNTHROID) 150 MCG tablet Take 1 tablet by mouth once daily 90 tablet 4  losartan (COZAAR) 25 MG tablet Take 25 mg by mouth daily.     lovastatin (MEVACOR) 40 MG tablet Take 1 tablet by mouth once daily 90 tablet 3   Multiple Vitamin (DAILY VITAMIN PO) Take 1 tablet by mouth daily.     amLODipine (NORVASC) 2.5 MG tablet Take 1 tablet by mouth once daily (Patient not taking: Reported on 09/22/2023) 90 tablet 4   clotrimazole-betamethasone (LOTRISONE) cream APPLY TOPICALLY ONCE DAILY (Patient not taking: Reported on 03/21/2023) 15 g 0   COD LIVER OIL PO Take 1 capsule by mouth daily. (Patient not taking: Reported on 03/21/2023)     metoprolol tartrate (LOPRESSOR) 100 MG tablet Take tablet TWO hours prior to his cardiac CT scan. (Patient not taking: Reported on 09/22/2023) 1 tablet 0   naproxen (NAPROSYN) 500 MG tablet Take 1 tablet (500 mg total) by mouth 2 (two) times daily with a meal. (Patient not taking: Reported on 03/21/2023) 30 tablet 1   predniSONE (DELTASONE) 5 MG tablet Take by mouth. (Patient not taking: Reported on 04/29/2023)     traMADol (ULTRAM) 50 MG tablet Take 50 mg by mouth every 6 (six) hours as needed. (Patient not taking: Reported on 09/07/2023)     vitamin B-12 (CYANOCOBALAMIN) 100 MCG tablet Take 100 mcg by mouth daily. Unsure of dose  is OTC med (Patient not  taking: Reported on 09/22/2023)     No current facility-administered medications for this visit.    PHYSICAL EXAMINATION:   Vitals:   09/22/23 1031  BP: 125/73  Pulse: 79  Temp: 98.1 F (36.7 C)  SpO2: 100%   Filed Weights   09/22/23 1031  Weight: 207 lb 12.8 oz (94.3 kg)    Physical Exam Vitals and nursing note reviewed.  HENT:     Head: Normocephalic and atraumatic.     Mouth/Throat:     Pharynx: Oropharynx is clear.  Eyes:     Extraocular Movements: Extraocular movements intact.     Pupils: Pupils are equal, round, and reactive to light.  Cardiovascular:     Rate and Rhythm: Normal rate and regular rhythm.  Pulmonary:     Comments: Decreased breath sounds bilaterally.  Abdominal:     Palpations: Abdomen is soft.  Musculoskeletal:        General: Normal range of motion.     Cervical back: Normal range of motion.  Skin:    General: Skin is warm.  Neurological:     General: No focal deficit present.     Mental Status: He is alert and oriented to person, place, and time.  Psychiatric:        Behavior: Behavior normal.        Judgment: Judgment normal.     LABORATORY DATA:  I have reviewed the data as listed Lab Results  Component Value Date   WBC 6.5 10/26/2022   HGB 13.1 10/26/2022   HCT 37.2 (L) 10/26/2022   MCV 91 10/26/2022   PLT 205 10/26/2022   Recent Labs    10/26/22 1015  NA 134  K 4.0  CL 94*  CO2 29  GLUCOSE 90  BUN 12  CREATININE 1.17  CALCIUM 9.4  PROT 7.1  ALBUMIN 4.3  AST 27  ALT 12  ALKPHOS 42*  BILITOT 0.7    RADIOGRAPHIC STUDIES: I have personally reviewed the radiological images as listed and agreed with the findings in the report. No results found.   MGUS (monoclonal gammopathy of unknown significance) # MGUS:IgG K-  09/11/2022- show  a hemoglobin of 12.7, M spike 1.1 and a kappa lambda light chain ratio of 1.84.  This is very stable from previous year from 2019.    # [PCP-]SEP 2024- Hb 12.3; normal creatinine;  normal calcium- no evidence of progression.  Patient does not meet crab criteria.  No evidence of any endorgan damage.  Kappa lambda light chain ratio multiple myeloma panel pending.  Low risk of progression to myeloma was discussed- if worse [> 1.5]- recommend bone marrow bisopy.  Clinically less likely at this time.  # PN vs PVD- 1-2.- s/p evaluation with Neurology- Stable.   # Hyponatremia- sodium 129 [Rheum labs]- awaiting PCP evaluation.    My chart-  # DISPOSITION: # follow up in 12 months-1 weeks PRIOR labs- cbc/cmp;MM panel; K/l light chain- Dr. Leonard Schwartz   Above plan of care was discussed with patient/family in detail.  My contact information was given to the patient/family.     Earna Coder, MD 09/22/2023 12:19 PM

## 2023-09-21 NOTE — Assessment & Plan Note (Signed)
#   MGUS:IgG K-  09/11/2022- show a hemoglobin of 12.7, M spike 1.1 and a kappa lambda light chain ratio of 1.84.  This is very stable from previous year from 2019.    # [PCP-]SEP 2024- Hb 12.3; normal creatinine; normal calcium- no evidence of progression.  Patient does not meet crab criteria.  No evidence of any endorgan damage.  Kappa lambda light chain ratio multiple myeloma panel pending.  Low risk of progression to myeloma was discussed- if worse [> 1.5]- recommend bone marrow bisopy.  Clinically less likely at this time.  # PN vs PVD- 1-2.- s/p evaluation with Neurology- Stable.   # Hyponatremia- sodium 129 [Rheum labs]- awaiting PCP evaluation.    My chart-  # DISPOSITION: # follow up in 12 months-1 weeks PRIOR labs- cbc/cmp;MM panel; K/l light chain- Dr. Leonard Schwartz

## 2023-09-22 ENCOUNTER — Ambulatory Visit: Payer: Medicare HMO | Admitting: Internal Medicine

## 2023-09-22 ENCOUNTER — Inpatient Hospital Stay: Payer: Medicare HMO

## 2023-09-22 ENCOUNTER — Encounter: Payer: Self-pay | Admitting: Internal Medicine

## 2023-09-22 ENCOUNTER — Inpatient Hospital Stay (HOSPITAL_BASED_OUTPATIENT_CLINIC_OR_DEPARTMENT_OTHER): Payer: Medicare HMO | Admitting: Internal Medicine

## 2023-09-22 VITALS — BP 125/73 | HR 79 | Temp 98.1°F | Ht 72.0 in | Wt 207.8 lb

## 2023-09-22 DIAGNOSIS — D472 Monoclonal gammopathy: Secondary | ICD-10-CM

## 2023-09-22 DIAGNOSIS — E871 Hypo-osmolality and hyponatremia: Secondary | ICD-10-CM | POA: Diagnosis not present

## 2023-09-22 DIAGNOSIS — Z87891 Personal history of nicotine dependence: Secondary | ICD-10-CM | POA: Diagnosis not present

## 2023-09-22 NOTE — Progress Notes (Signed)
C/o constipation, lactulose.  SOB all the time, getting worse per wife,pt states the same.

## 2023-09-23 ENCOUNTER — Ambulatory Visit: Payer: Medicare HMO | Admitting: Family Medicine

## 2023-09-23 LAB — KAPPA/LAMBDA LIGHT CHAINS
Kappa free light chain: 22.8 mg/L — ABNORMAL HIGH (ref 3.3–19.4)
Kappa, lambda light chain ratio: 1.62 (ref 0.26–1.65)
Lambda free light chains: 14.1 mg/L (ref 5.7–26.3)

## 2023-09-25 LAB — MULTIPLE MYELOMA PANEL, SERUM
Albumin SerPl Elph-Mcnc: 3.5 g/dL (ref 2.9–4.4)
Albumin/Glob SerPl: 1.5 (ref 0.7–1.7)
Alpha 1: 0.2 g/dL (ref 0.0–0.4)
Alpha2 Glob SerPl Elph-Mcnc: 0.4 g/dL (ref 0.4–1.0)
B-Globulin SerPl Elph-Mcnc: 0.6 g/dL — ABNORMAL LOW (ref 0.7–1.3)
Gamma Glob SerPl Elph-Mcnc: 1.3 g/dL (ref 0.4–1.8)
Globulin, Total: 2.5 g/dL (ref 2.2–3.9)
IgA: 141 mg/dL (ref 61–437)
IgG (Immunoglobin G), Serum: 1428 mg/dL (ref 603–1613)
IgM (Immunoglobulin M), Srm: 47 mg/dL (ref 15–143)
M Protein SerPl Elph-Mcnc: 0.9 g/dL — ABNORMAL HIGH
Total Protein ELP: 6 g/dL (ref 6.0–8.5)

## 2023-09-28 ENCOUNTER — Ambulatory Visit (INDEPENDENT_AMBULATORY_CARE_PROVIDER_SITE_OTHER): Payer: Medicare HMO | Admitting: Family Medicine

## 2023-09-28 ENCOUNTER — Encounter: Payer: Self-pay | Admitting: Internal Medicine

## 2023-09-28 VITALS — BP 126/65 | HR 82 | Temp 97.5°F | Ht 73.0 in | Wt 205.4 lb

## 2023-09-28 DIAGNOSIS — I10 Essential (primary) hypertension: Secondary | ICD-10-CM | POA: Diagnosis not present

## 2023-09-28 DIAGNOSIS — G5621 Lesion of ulnar nerve, right upper limb: Secondary | ICD-10-CM | POA: Diagnosis not present

## 2023-09-28 DIAGNOSIS — E871 Hypo-osmolality and hyponatremia: Secondary | ICD-10-CM

## 2023-09-28 DIAGNOSIS — M19021 Primary osteoarthritis, right elbow: Secondary | ICD-10-CM | POA: Diagnosis not present

## 2023-09-28 DIAGNOSIS — E039 Hypothyroidism, unspecified: Secondary | ICD-10-CM

## 2023-09-28 MED ORDER — PREGABALIN 50 MG PO CAPS
50.0000 mg | ORAL_CAPSULE | Freq: Two times a day (BID) | ORAL | 1 refills | Status: DC
Start: 2023-09-28 — End: 2024-01-12

## 2023-09-28 NOTE — Patient Instructions (Signed)
.   Please review the attached list of medications and notify my office if there are any errors.   . Please bring all of your medications to every appointment so we can make sure that our medication list is the same as yours.   

## 2023-09-28 NOTE — Progress Notes (Addendum)
Established patient visit   Patient: Daniel Carr   DOB: 05-13-1935   88 y.o. Male  MRN: [id number] Visit Date: 09/28/2023  Today's healthcare provider: Mila Merry, MD   No chief complaint on file.  Subjective    HPI  Discussed the use of AI scribe software for clinical note transcription with the patient, who gave verbal consent to proceed.  History of Present Illness   The patient, with a history of thyroid issues, neuropathy, and chronic right elbow pain presents with concerns about recent lab results by his rheumatologist and persistent elbow pain. He also reports a recent near-fall incident at home, attributing it to a loss of balance due to stepping on a loose bedroom shoe. The patient has been experiencing increased instability, but denies any actual falls. He has been using a cane for support, and has a walker available, but prefers not to use it due to the inconvenience of folding and unfolding it. He also reports he recently completed course of physical therapy.  The patient's elbow pain has been a significant issue, causing discomfort and impacting daily activities. His rheumatologist did not feel this was inflammatory in nature and prescribed duloxetine, but patient stopped taking it after two days due to severe lethargy and a general feeling of being unwell. He was also found to be hyponatremic with sodium of 129 and hypokalemic on labs done at his rheumatology appointment. He is currently taking hydrochlorothiazide 12.5mg  a day and typically has borderline hyponatremia, but this is a drop from his baseline,   The patient also reports frequent nocturia, waking up twice a night on a good night, which disrupts his sleep. Despite these interruptions, he reports that he generally sleeps well. He also mentions feeling cold frequently, even when the house is kept at a warm temperature.       Medications: Outpatient Medications Prior to Visit  Medication Sig   albuterol  (VENTOLIN HFA) 108 (90 Base) MCG/ACT inhaler SMARTSIG:2 Puff(s) By Mouth As Directed   amLODipine (NORVASC) 2.5 MG tablet Take 1 tablet by mouth once daily (Patient not taking: Reported on 09/22/2023)   aspirin 81 MG EC tablet Take 1 tablet (81 mg total) by mouth daily. Swallow whole.   azelastine (ASTELIN) 0.1 % nasal spray Place 1 spray into the nose daily as needed (allergies).   brimonidine (ALPHAGAN) 0.2 % ophthalmic solution Place 1 drop into both eyes in the morning and at bedtime.   clotrimazole-betamethasone (LOTRISONE) cream APPLY TOPICALLY ONCE DAILY (Patient not taking: Reported on 03/21/2023)   COD LIVER OIL PO Take 1 capsule by mouth daily. (Patient not taking: Reported on 03/21/2023)   dorzolamide (TRUSOPT) 2 % ophthalmic solution Apply to eye.   dutasteride (AVODART) 0.5 MG capsule Take 1 capsule by mouth once daily   furosemide (LASIX) 20 MG tablet Take 20 mg by mouth daily.   hydrochlorothiazide (HYDRODIURIL) 25 MG tablet TAKE ONE-HALF TABLET BY MOUTH ONCE DAILY   hydroxychloroquine (PLAQUENIL) 200 MG tablet Take 200 mg by mouth daily.   ipratropium (ATROVENT) 0.06 % nasal spray Place 1 spray into both nostrils 2 (two) times daily as needed for rhinitis.   latanoprost (XALATAN) 0.005 % ophthalmic solution SMARTSIG:In Eye(s)   levocetirizine (XYZAL) 5 MG tablet Take 5 mg by mouth every evening.   levothyroxine (SYNTHROID) 150 MCG tablet Take 1 tablet by mouth once daily   losartan (COZAAR) 25 MG tablet Take 25 mg by mouth daily.   lovastatin (MEVACOR) 40 MG tablet Take  1 tablet by mouth once daily   metoprolol tartrate (LOPRESSOR) 100 MG tablet Take tablet TWO hours prior to his cardiac CT scan. (Patient not taking: Reported on 09/22/2023)   Multiple Vitamin (DAILY VITAMIN PO) Take 1 tablet by mouth daily.   naproxen (NAPROSYN) 500 MG tablet Take 1 tablet (500 mg total) by mouth 2 (two) times daily with a meal. (Patient not taking: Reported on 03/21/2023)   predniSONE (DELTASONE) 5  MG tablet Take by mouth. (Patient not taking: Reported on 04/29/2023)   traMADol (ULTRAM) 50 MG tablet Take 50 mg by mouth every 6 (six) hours as needed. (Patient not taking: Reported on 09/07/2023)   vitamin B-12 (CYANOCOBALAMIN) 100 MCG tablet Take 100 mcg by mouth daily. Unsure of dose  is OTC med (Patient not taking: Reported on 09/22/2023)   No facility-administered medications prior to visit.    Review of Systems  Constitutional:  Negative for appetite change, chills and fever.  Respiratory:  Negative for chest tightness, shortness of breath and wheezing.   Cardiovascular:  Negative for chest pain and palpitations.  Gastrointestinal:  Negative for abdominal pain, nausea and vomiting.      Objective    BP 126/65 (BP Location: Left Arm, Patient Position: Sitting, Cuff Size: Normal)   Pulse 82   Temp (!) 97.5 F (36.4 C) (Oral)   Ht 6\' 1"  (1.854 m)   Wt 205 lb 6.4 oz (93.2 kg)   SpO2 100%   BMI 27.10 kg/m    Physical Exam  General appearance: Well developed, well nourished male, cooperative and in no acute distress Head: Normocephalic, without obvious abnormality, atraumatic Respiratory: Respirations even and unlabored, normal respiratory rate Extremities: All extremities are intact.  Skin: Skin color, texture, turgor normal. No rashes seen  Psych: Appropriate mood and affect. Neurologic: Mental status: Alert, oriented to person, place, and time, thought content appropriate. Shuffled gait.   Assessment & Plan        Elbow Pain Chronic elbow pain, possibly due to neuropathy and/or osteo arthritis. Duloxetine was not tolerated. continues on hydroxychloroquine prescribed by Rheumatology. -Start Lyrica for neuropathy pain. -Continue Hydroxychloroquine.  Hyponatremia/Hypokalemia Low sodium and potassium levels, likely due to Hydrochlorothiazide use. -Discontinue Hydrochlorothiazide as BP is very well controlled.   hypothyroid History of thyroid issues, possible contribution  to patient's cold intolerance and hyponatremia -Order T4 and TSH  Frequent Urination Frequent nocturnal urination, possibly due to diuretic use. -Discontinue hctz.  Fall Risk Recent near fall, possibly due to balance issues. -Encourage use of walker for stability. -Recently completed course of physical therapy  General Health Maintenance -Schedule physical exam in November and follow up on effects of lyrica and discontinuation of hctz at that time.              Mila Merry, MD  [facility] [phone] (phone) [phone] (fax)  [facility]

## 2023-09-29 LAB — RENAL FUNCTION PANEL
Albumin: 4 g/dL (ref 3.7–4.7)
BUN/Creatinine Ratio: 12 (ref 10–24)
BUN: 13 mg/dL (ref 8–27)
CO2: 28 mmol/L (ref 20–29)
Calcium: 9.1 mg/dL (ref 8.6–10.2)
Chloride: 91 mmol/L — ABNORMAL LOW (ref 96–106)
Creatinine, Ser: 1.05 mg/dL (ref 0.76–1.27)
Glucose: 88 mg/dL (ref 70–99)
Phosphorus: 3 mg/dL (ref 2.8–4.1)
Potassium: 3.6 mmol/L (ref 3.5–5.2)
Sodium: 132 mmol/L — ABNORMAL LOW (ref 134–144)
eGFR: 69 mL/min/{1.73_m2} (ref >59)

## 2023-09-29 LAB — TSH: TSH: 1.95 u[IU]/mL (ref 0.450–4.500)

## 2023-09-29 LAB — MAGNESIUM: Magnesium: 2 mg/dL (ref 1.6–2.3)

## 2023-09-29 LAB — T4, FREE: Free T4: 1.8 ng/dL — ABNORMAL HIGH (ref 0.82–1.77)

## 2023-10-05 ENCOUNTER — Ambulatory Visit: Payer: Medicare HMO | Admitting: Family Medicine

## 2023-11-16 ENCOUNTER — Encounter: Payer: Medicare HMO | Admitting: Family Medicine

## 2023-11-29 ENCOUNTER — Ambulatory Visit: Payer: Self-pay

## 2023-11-29 NOTE — Telephone Encounter (Signed)
  Chief Complaint: fall Symptoms: fell getting out of car d/t missed step, R arm pain and unable to move normally Frequency: today Pertinent Negatives: NA Disposition: [] ED /[x] Urgent Care (no appt availability in office) / [] Appointment(In office/virtual)/ []  [facility]/ [] Home Care/ [] Refused Recommended Disposition /[] [place] [facility]/ []  Follow-up with PCP Additional Notes: spoke with pt's daughter Aggie Cosier. She states she is wanting him to have XR done of arm since he is in pain and unable to move arm. Advised pt can go to UC or [facility] and be seen. She states she will take him to [facility].   Reason for Disposition  [1] MODERATE weakness (i.e., interferes with work, school, normal activities) AND [2] new-onset or worsening  Answer Assessment - Initial Assessment Questions 1. MECHANISM: "How did the fall happen?"     Got out of car and missed step  3. ONSET: "When did the fall happen?" (e.g., minutes, hours, or days ago)     Fall today  4. LOCATION: "What part of the body hit the ground?" (e.g., back, buttocks, head, hips, knees, hands, head, stomach)     R arm  5. INJURY: "Did you hurt (injure) yourself when you fell?" If Yes, ask: "What did you injure? Tell me more about this?" (e.g., body area; type of injury; pain severity)"     R arm  6. PAIN: "Is there any pain?" If Yes, ask: "How bad is the pain?" (e.g., Scale 1-10; or mild,  moderate, severe)   - NONE (0): No pain   - MILD (1-3): Doesn't interfere with normal activities    - MODERATE (4-7): Interferes with normal activities or awakens from sleep    - SEVERE (8-10): Excruciating pain, unable to do any normal activities      Severe unable to move normal  7. SIZE: For cuts, bruises, or swelling, ask: "How large is it?" (e.g., inches or centimeters)      Unsure  Protocols used: Falls and [facility]

## 2023-12-01 ENCOUNTER — Ambulatory Visit: Payer: Medicare HMO | Admitting: Family Medicine

## 2023-12-01 NOTE — Progress Notes (Signed)
Opened in error

## 2023-12-13 ENCOUNTER — Telehealth: Payer: Self-pay

## 2023-12-13 NOTE — Telephone Encounter (Signed)
Dr. Jamal Maes calling to give report of pt status. Recent hospitalization LOS 11 days, initial R humeral fx, Pulmonary HTN primary  diagnosis switched to Anoro inhaler,  CTA, CTH Pulmo, started Eliquis 5 MG every day, SNF [facility] 2.5 weeks -3 weeks, Zettie Cooley Pulm HTN provider to FU with [facility]. Remeron 15mg  BID for appetite, Flomax wanting to be on but  [facility] hold, ortho consult after DC  Hard time with BiPap, ENT referral for [facility], sleep study scheduled Dr. Sherrie Mustache to FU after SNF DC

## 2023-12-26 ENCOUNTER — Encounter: Payer: Medicare HMO | Admitting: Family Medicine

## 2023-12-27 ENCOUNTER — Inpatient Hospital Stay: Payer: Medicare HMO

## 2023-12-27 ENCOUNTER — Emergency Department: Payer: Medicare HMO

## 2023-12-27 ENCOUNTER — Telehealth: Payer: Self-pay | Admitting: Family Medicine

## 2023-12-27 ENCOUNTER — Inpatient Hospital Stay
Admission: EM | Admit: 2023-12-27 | Discharge: 2024-01-12 | DRG: 698 | Disposition: A | Discharge status: Hospice | Payer: Medicare HMO | Source: Skilled Nursing Facility | Attending: Obstetrics and Gynecology | Admitting: Obstetrics and Gynecology

## 2023-12-27 ENCOUNTER — Emergency Department: Admission: EM | Admit: 2023-12-27 | Payer: Medicare Other | Source: Home / Self Care

## 2023-12-27 DIAGNOSIS — D638 Anemia in other chronic diseases classified elsewhere: Secondary | ICD-10-CM | POA: Diagnosis present

## 2023-12-27 DIAGNOSIS — J9622 Acute and chronic respiratory failure with hypercapnia: Secondary | ICD-10-CM | POA: Diagnosis present

## 2023-12-27 DIAGNOSIS — E860 Dehydration: Secondary | ICD-10-CM | POA: Diagnosis present

## 2023-12-27 DIAGNOSIS — Z8249 Family history of ischemic heart disease and other diseases of the circulatory system: Secondary | ICD-10-CM

## 2023-12-27 DIAGNOSIS — E039 Hypothyroidism, unspecified: Secondary | ICD-10-CM | POA: Diagnosis present

## 2023-12-27 DIAGNOSIS — E8721 Acute metabolic acidosis: Secondary | ICD-10-CM | POA: Diagnosis present

## 2023-12-27 DIAGNOSIS — A4181 Sepsis due to Enterococcus: Secondary | ICD-10-CM | POA: Diagnosis present

## 2023-12-27 DIAGNOSIS — Z515 Encounter for palliative care: Secondary | ICD-10-CM | POA: Diagnosis not present

## 2023-12-27 DIAGNOSIS — D472 Monoclonal gammopathy: Secondary | ICD-10-CM | POA: Diagnosis present

## 2023-12-27 DIAGNOSIS — N170 Acute kidney failure with tubular necrosis: Secondary | ICD-10-CM | POA: Diagnosis present

## 2023-12-27 DIAGNOSIS — J441 Chronic obstructive pulmonary disease with (acute) exacerbation: Secondary | ICD-10-CM | POA: Diagnosis present

## 2023-12-27 DIAGNOSIS — Z823 Family history of stroke: Secondary | ICD-10-CM

## 2023-12-27 DIAGNOSIS — Z79890 Hormone replacement therapy (postmenopausal): Secondary | ICD-10-CM

## 2023-12-27 DIAGNOSIS — S42301D Unspecified fracture of shaft of humerus, right arm, subsequent encounter for fracture with routine healing: Secondary | ICD-10-CM

## 2023-12-27 DIAGNOSIS — I110 Hypertensive heart disease with heart failure: Secondary | ICD-10-CM | POA: Diagnosis present

## 2023-12-27 DIAGNOSIS — G928 Other toxic encephalopathy: Secondary | ICD-10-CM | POA: Diagnosis present

## 2023-12-27 DIAGNOSIS — N179 Acute kidney failure, unspecified: Secondary | ICD-10-CM | POA: Diagnosis not present

## 2023-12-27 DIAGNOSIS — N390 Urinary tract infection, site not specified: Secondary | ICD-10-CM

## 2023-12-27 DIAGNOSIS — I82512 Chronic embolism and thrombosis of left femoral vein: Secondary | ICD-10-CM | POA: Diagnosis present

## 2023-12-27 DIAGNOSIS — Y658 Other specified misadventures during surgical and medical care: Secondary | ICD-10-CM | POA: Diagnosis present

## 2023-12-27 DIAGNOSIS — E559 Vitamin D deficiency, unspecified: Secondary | ICD-10-CM | POA: Diagnosis present

## 2023-12-27 DIAGNOSIS — E440 Moderate protein-calorie malnutrition: Secondary | ICD-10-CM | POA: Diagnosis present

## 2023-12-27 DIAGNOSIS — A419 Sepsis, unspecified organism: Secondary | ICD-10-CM | POA: Diagnosis not present

## 2023-12-27 DIAGNOSIS — I82511 Chronic embolism and thrombosis of right femoral vein: Secondary | ICD-10-CM | POA: Diagnosis present

## 2023-12-27 DIAGNOSIS — J9621 Acute and chronic respiratory failure with hypoxia: Secondary | ICD-10-CM | POA: Diagnosis present

## 2023-12-27 DIAGNOSIS — E871 Hypo-osmolality and hyponatremia: Secondary | ICD-10-CM | POA: Diagnosis present

## 2023-12-27 DIAGNOSIS — I5033 Acute on chronic diastolic (congestive) heart failure: Secondary | ICD-10-CM | POA: Diagnosis not present

## 2023-12-27 DIAGNOSIS — E1165 Type 2 diabetes mellitus with hyperglycemia: Secondary | ICD-10-CM | POA: Diagnosis present

## 2023-12-27 DIAGNOSIS — G9341 Metabolic encephalopathy: Secondary | ICD-10-CM | POA: Diagnosis not present

## 2023-12-27 DIAGNOSIS — Z7189 Other specified counseling: Secondary | ICD-10-CM | POA: Diagnosis not present

## 2023-12-27 DIAGNOSIS — E785 Hyperlipidemia, unspecified: Secondary | ICD-10-CM | POA: Diagnosis present

## 2023-12-27 DIAGNOSIS — R0602 Shortness of breath: Secondary | ICD-10-CM | POA: Diagnosis present

## 2023-12-27 DIAGNOSIS — J690 Pneumonitis due to inhalation of food and vomit: Secondary | ICD-10-CM | POA: Diagnosis present

## 2023-12-27 DIAGNOSIS — I350 Nonrheumatic aortic (valve) stenosis: Secondary | ICD-10-CM | POA: Diagnosis present

## 2023-12-27 DIAGNOSIS — Z7982 Long term (current) use of aspirin: Secondary | ICD-10-CM

## 2023-12-27 DIAGNOSIS — T83511A Infection and inflammatory reaction due to indwelling urethral catheter, initial encounter: Principal | ICD-10-CM | POA: Diagnosis present

## 2023-12-27 DIAGNOSIS — B952 Enterococcus as the cause of diseases classified elsewhere: Secondary | ICD-10-CM

## 2023-12-27 DIAGNOSIS — Z87891 Personal history of nicotine dependence: Secondary | ICD-10-CM

## 2023-12-27 DIAGNOSIS — D62 Acute posthemorrhagic anemia: Secondary | ICD-10-CM | POA: Diagnosis present

## 2023-12-27 DIAGNOSIS — W19XXXD Unspecified fall, subsequent encounter: Secondary | ICD-10-CM | POA: Diagnosis present

## 2023-12-27 DIAGNOSIS — I2724 Chronic thromboembolic pulmonary hypertension: Secondary | ICD-10-CM | POA: Diagnosis present

## 2023-12-27 DIAGNOSIS — I82531 Chronic embolism and thrombosis of right popliteal vein: Secondary | ICD-10-CM | POA: Diagnosis present

## 2023-12-27 DIAGNOSIS — H409 Unspecified glaucoma: Secondary | ICD-10-CM | POA: Diagnosis present

## 2023-12-27 DIAGNOSIS — J9601 Acute respiratory failure with hypoxia: Secondary | ICD-10-CM | POA: Diagnosis present

## 2023-12-27 DIAGNOSIS — R7881 Bacteremia: Secondary | ICD-10-CM | POA: Diagnosis not present

## 2023-12-27 DIAGNOSIS — Z7901 Long term (current) use of anticoagulants: Secondary | ICD-10-CM

## 2023-12-27 DIAGNOSIS — R6521 Severe sepsis with septic shock: Secondary | ICD-10-CM | POA: Diagnosis present

## 2023-12-27 DIAGNOSIS — E875 Hyperkalemia: Secondary | ICD-10-CM | POA: Diagnosis present

## 2023-12-27 DIAGNOSIS — Z96653 Presence of artificial knee joint, bilateral: Secondary | ICD-10-CM | POA: Diagnosis present

## 2023-12-27 DIAGNOSIS — Z6826 Body mass index (BMI) 26.0-26.9, adult: Secondary | ICD-10-CM

## 2023-12-27 DIAGNOSIS — J449 Chronic obstructive pulmonary disease, unspecified: Secondary | ICD-10-CM | POA: Diagnosis present

## 2023-12-27 DIAGNOSIS — I82532 Chronic embolism and thrombosis of left popliteal vein: Secondary | ICD-10-CM | POA: Diagnosis present

## 2023-12-27 DIAGNOSIS — R7401 Elevation of levels of liver transaminase levels: Secondary | ICD-10-CM | POA: Diagnosis present

## 2023-12-27 DIAGNOSIS — Z79899 Other long term (current) drug therapy: Secondary | ICD-10-CM

## 2023-12-27 DIAGNOSIS — M1990 Unspecified osteoarthritis, unspecified site: Secondary | ICD-10-CM | POA: Diagnosis present

## 2023-12-27 DIAGNOSIS — Z86711 Personal history of pulmonary embolism: Secondary | ICD-10-CM

## 2023-12-27 DIAGNOSIS — R54 Age-related physical debility: Secondary | ICD-10-CM | POA: Diagnosis present

## 2023-12-27 DIAGNOSIS — E869 Volume depletion, unspecified: Secondary | ICD-10-CM | POA: Diagnosis present

## 2023-12-27 LAB — LACTIC ACID, PLASMA
Lactic Acid, Venous: 2.4 mmol/L (ref 0.5–1.9)
Lactic Acid, Venous: 4 mmol/L (ref 0.5–1.9)
Lactic Acid, Venous: 3.6 mmol/L (ref 0.5–1.9)

## 2023-12-27 LAB — COMPREHENSIVE METABOLIC PANEL
ALT: 89 U/L — ABNORMAL HIGH (ref 0–44)
AST: 143 U/L — ABNORMAL HIGH (ref 15–41)
Albumin: 2.5 g/dL — ABNORMAL LOW (ref 3.5–5.0)
Alkaline Phosphatase: 80 U/L (ref 38–126)
Anion gap: 13 (ref 5–15)
BUN: 72 mg/dL — ABNORMAL HIGH (ref 8–23)
CO2: 26 mmol/L (ref 22–32)
Calcium: 8.7 mg/dL — ABNORMAL LOW (ref 8.9–10.3)
Chloride: 86 mmol/L — ABNORMAL LOW (ref 98–111)
Creatinine, Ser: 4.46 mg/dL — ABNORMAL HIGH (ref 0.61–1.24)
GFR, Estimated: 12 mL/min — ABNORMAL LOW (ref >60)
Glucose, Bld: 99 mg/dL (ref 70–99)
Potassium: 5.9 mmol/L — ABNORMAL HIGH (ref 3.5–5.1)
Sodium: 125 mmol/L — ABNORMAL LOW (ref 135–145)
Total Bilirubin: 0.9 mg/dL (ref 0.0–1.2)
Total Protein: 6.8 g/dL (ref 6.5–8.1)

## 2023-12-27 LAB — RESP PANEL BY RT-PCR (RSV, FLU A&B, COVID)  RVPGX2
Influenza A by PCR: NEGATIVE
Influenza B by PCR: NEGATIVE
Resp Syncytial Virus by PCR: NEGATIVE
SARS Coronavirus 2 by RT PCR: NEGATIVE

## 2023-12-27 LAB — BASIC METABOLIC PANEL
Anion gap: 17 — ABNORMAL HIGH (ref 5–15)
BUN: 75 mg/dL — ABNORMAL HIGH (ref 8–23)
CO2: 22 mmol/L (ref 22–32)
Calcium: 8.4 mg/dL — ABNORMAL LOW (ref 8.9–10.3)
Chloride: 87 mmol/L — ABNORMAL LOW (ref 98–111)
Creatinine, Ser: 4.44 mg/dL — ABNORMAL HIGH (ref 0.61–1.24)
GFR, Estimated: 12 mL/min — ABNORMAL LOW (ref >60)
Glucose, Bld: 115 mg/dL — ABNORMAL HIGH (ref 70–99)
Potassium: 5.5 mmol/L — ABNORMAL HIGH (ref 3.5–5.1)
Sodium: 126 mmol/L — ABNORMAL LOW (ref 135–145)

## 2023-12-27 LAB — CBC WITH DIFFERENTIAL/PLATELET
Abs Immature Granulocytes: 0.22 10*3/uL — ABNORMAL HIGH (ref 0.00–0.07)
Basophils Absolute: 0.1 10*3/uL (ref 0.0–0.1)
Basophils Relative: 1 %
Eosinophils Absolute: 0 10*3/uL (ref 0.0–0.5)
Eosinophils Relative: 0 %
HCT: 36.3 % — ABNORMAL LOW (ref 39.0–52.0)
Hemoglobin: 11.2 g/dL — ABNORMAL LOW (ref 13.0–17.0)
Immature Granulocytes: 1 %
Lymphocytes Relative: 8 %
Lymphs Abs: 1.2 10*3/uL (ref 0.7–4.0)
MCH: 31.3 pg (ref 26.0–34.0)
MCHC: 30.9 g/dL (ref 30.0–36.0)
MCV: 101.4 fL — ABNORMAL HIGH (ref 80.0–100.0)
Monocytes Absolute: 2 10*3/uL — ABNORMAL HIGH (ref 0.1–1.0)
Monocytes Relative: 12 %
Neutro Abs: 12.3 10*3/uL — ABNORMAL HIGH (ref 1.7–7.7)
Neutrophils Relative %: 78 %
Platelets: 151 10*3/uL (ref 150–400)
RBC: 3.58 MIL/uL — ABNORMAL LOW (ref 4.22–5.81)
RDW: 14.8 % (ref 11.5–15.5)
Smear Review: NORMAL
WBC: 15.9 10*3/uL — ABNORMAL HIGH (ref 4.0–10.5)
nRBC: 0.9 % — ABNORMAL HIGH (ref 0.0–0.2)

## 2023-12-27 LAB — BLOOD GAS, VENOUS
Acid-base deficit: 3 mmol/L — ABNORMAL HIGH (ref 0.0–2.0)
Acid-base deficit: 3.3 mmol/L — ABNORMAL HIGH (ref 0.0–2.0)
Bicarbonate: 27.4 mmol/L (ref 20.0–28.0)
Bicarbonate: 27 mmol/L (ref 20.0–28.0)
O2 Saturation: 76.6 %
O2 Saturation: 62.3 %
Patient temperature: 37
Patient temperature: 37
pCO2, Ven: 75 mm[Hg] (ref 44–60)
pCO2, Ven: 74 mm[Hg] (ref 44–60)
pH, Ven: 7.17 — CL (ref 7.25–7.43)
pH, Ven: 7.17 — CL (ref 7.25–7.43)
pO2, Ven: 48 mm[Hg] — ABNORMAL HIGH (ref 32–45)
pO2, Ven: 38 mm[Hg] (ref 32–45)

## 2023-12-27 LAB — APTT: aPTT: 48 s — ABNORMAL HIGH (ref 24–36)

## 2023-12-27 LAB — BRAIN NATRIURETIC PEPTIDE: B Natriuretic Peptide: 1760.3 pg/mL — ABNORMAL HIGH (ref 0.0–100.0)

## 2023-12-27 LAB — HEPARIN LEVEL (UNFRACTIONATED): Heparin Unfractionated: >1.1 [IU]/mL — ABNORMAL HIGH (ref 0.30–0.70)

## 2023-12-27 LAB — GLUCOSE, CAPILLARY
Glucose-Capillary: 107 mg/dL — ABNORMAL HIGH (ref 70–99)
Glucose-Capillary: 129 mg/dL — ABNORMAL HIGH (ref 70–99)
Glucose-Capillary: 81 mg/dL (ref 70–99)

## 2023-12-27 LAB — AMMONIA: Ammonia: 99 umol/L — ABNORMAL HIGH (ref 9–35)

## 2023-12-27 LAB — TROPONIN I (HIGH SENSITIVITY)
Troponin I (High Sensitivity): 550 ng/L (ref <18)
Troponin I (High Sensitivity): 529 ng/L (ref <18)

## 2023-12-27 LAB — PROTIME-INR
INR: 2.2 — ABNORMAL HIGH (ref 0.8–1.2)
Prothrombin Time: 25 s — ABNORMAL HIGH (ref 11.4–15.2)

## 2023-12-27 LAB — PROCALCITONIN: Procalcitonin: 19.58 ng/mL

## 2023-12-27 LAB — T4, FREE: Free T4: 0.99 ng/dL (ref 0.61–1.12)

## 2023-12-27 LAB — OSMOLALITY: Osmolality: 297 mosm/kg — ABNORMAL HIGH (ref 275–295)

## 2023-12-27 LAB — MAGNESIUM: Magnesium: 2.2 mg/dL (ref 1.7–2.4)

## 2023-12-27 LAB — PHOSPHORUS: Phosphorus: 6.3 mg/dL — ABNORMAL HIGH (ref 2.5–4.6)

## 2023-12-27 LAB — MRSA NEXT GEN BY PCR, NASAL: MRSA by PCR Next Gen: NOT DETECTED

## 2023-12-27 LAB — TSH: TSH: 10.39 u[IU]/mL — ABNORMAL HIGH (ref 0.350–4.500)

## 2023-12-27 LAB — LIPASE, BLOOD: Lipase: 25 U/L (ref 11–51)

## 2023-12-27 MED ORDER — POLYETHYLENE GLYCOL 3350 17 G PO PACK: 17.0000 g | PACK | Freq: Every day | ORAL | Status: DC | PRN

## 2023-12-27 MED ORDER — FENTANYL 2500MCG IN NS 250ML (10MCG/ML) PREMIX INFUSION
0.0000 ug/h | INTRAVENOUS | Status: DC
  Administered 2023-12-27: 25 ug/h via INTRAVENOUS
  Filled 2023-12-27: qty 250

## 2023-12-27 MED ORDER — POLYETHYLENE GLYCOL 3350 17 G PO PACK
17.0000 g | PACK | Freq: Every day | ORAL | Status: DC
  Administered 2023-12-28 – 2023-12-31 (×4): 17 g
  Filled 2023-12-27 (×4): qty 1

## 2023-12-27 MED ORDER — SODIUM CHLORIDE 0.9 % IV BOLUS
1000.0000 mL | Freq: Once | INTRAVENOUS | Status: AC
  Administered 2023-12-27: 1000 mL via INTRAVENOUS

## 2023-12-27 MED ORDER — PRISMASOL BGK 2/3.5 32-2-3.5 MEQ/L EC SOLN
Status: DC
  Filled 2023-12-27: qty 5000

## 2023-12-27 MED ORDER — HEPARIN (PORCINE) 25000 UT/250ML-% IV SOLN
1200.0000 [IU]/h | INTRAVENOUS | Status: DC
  Filled 2023-12-27: qty 250

## 2023-12-27 MED ORDER — NOREPINEPHRINE 4 MG/250ML-% IV SOLN
INTRAVENOUS | Status: AC
  Administered 2023-12-27: 2 ug/min via INTRAVENOUS
  Filled 2023-12-27: qty 250

## 2023-12-27 MED ORDER — HEPARIN (PORCINE) 25000 UT/250ML-% IV SOLN
1000.0000 [IU]/h | INTRAVENOUS | Status: DC
  Administered 2023-12-27: 1200 [IU]/h via INTRAVENOUS

## 2023-12-27 MED ORDER — SODIUM CHLORIDE 0.9 % FOR CRRT: INTRAVENOUS_CENTRAL | Status: DC | PRN

## 2023-12-27 MED ORDER — KETAMINE HCL 50 MG/5ML IJ SOSY
1.5000 mg/kg | PREFILLED_SYRINGE | Freq: Once | INTRAMUSCULAR | Status: AC
  Administered 2023-12-27: 186 mg via INTRAVENOUS
  Filled 2023-12-27: qty 18.6

## 2023-12-27 MED ORDER — DOCUSATE SODIUM 50 MG/5ML PO LIQD: 100.0000 mg | Freq: Two times a day (BID) | ORAL | Status: DC | PRN

## 2023-12-27 MED ORDER — METHYLPREDNISOLONE SODIUM SUCC 40 MG IJ SOLR
40.0000 mg | Freq: Two times a day (BID) | INTRAMUSCULAR | Status: DC
  Administered 2023-12-28 – 2023-12-29 (×3): 40 mg via INTRAVENOUS
  Filled 2023-12-27 (×3): qty 1

## 2023-12-27 MED ORDER — SODIUM CHLORIDE 0.9 % IV SOLN
250.0000 mL | INTRAVENOUS | Status: AC
  Administered 2023-12-28: 250 mL via INTRAVENOUS

## 2023-12-27 MED ORDER — FENTANYL BOLUS VIA INFUSION
25.0000 ug | INTRAVENOUS | Status: DC | PRN
  Administered 2023-12-27 – 2023-12-28 (×2): 50 ug via INTRAVENOUS

## 2023-12-27 MED ORDER — FENTANYL CITRATE PF 50 MCG/ML IJ SOSY: 25.0000 ug | PREFILLED_SYRINGE | Freq: Once | INTRAMUSCULAR | Status: DC

## 2023-12-27 MED ORDER — INSULIN ASPART 100 UNIT/ML IV SOLN
10.0000 [IU] | Freq: Once | INTRAVENOUS | Status: DC
  Filled 2023-12-27: qty 0.1

## 2023-12-27 MED ORDER — MIDAZOLAM-SODIUM CHLORIDE 100-0.9 MG/100ML-% IV SOLN: 0.5000 mg/h | INTRAVENOUS | Status: DC

## 2023-12-27 MED ORDER — HEPARIN SODIUM (PORCINE) 5000 UNIT/ML IJ SOLN: 5000.0000 [IU] | Freq: Three times a day (TID) | INTRAMUSCULAR | Status: DC

## 2023-12-27 MED ORDER — FENTANYL 2500MCG IN NS 250ML (10MCG/ML) PREMIX INFUSION
25.0000 ug/h | INTRAVENOUS | Status: DC
  Administered 2023-12-27: 25 ug/h via INTRAVENOUS
  Administered 2023-12-28: 125 ug/h via INTRAVENOUS
  Administered 2023-12-29 – 2023-12-30 (×2): 100 ug/h via INTRAVENOUS
  Filled 2023-12-27 (×4): qty 250

## 2023-12-27 MED ORDER — SODIUM ZIRCONIUM CYCLOSILICATE 5 G PO PACK: 10.0000 g | PACK | Freq: Once | ORAL | Status: DC

## 2023-12-27 MED ORDER — NOREPINEPHRINE 4 MG/250ML-% IV SOLN
2.0000 ug/min | INTRAVENOUS | Status: DC
  Administered 2023-12-27: 2 ug/min via INTRAVENOUS
  Filled 2023-12-27: qty 250

## 2023-12-27 MED ORDER — HEPARIN SODIUM (PORCINE) 1000 UNIT/ML DIALYSIS
1000.0000 [IU] | INTRAMUSCULAR | Status: DC | PRN
  Filled 2023-12-27: qty 4
  Filled 2023-12-27: qty 2

## 2023-12-27 MED ORDER — IPRATROPIUM-ALBUTEROL 0.5-2.5 (3) MG/3ML IN SOLN
3.0000 mL | Freq: Four times a day (QID) | RESPIRATORY_TRACT | Status: DC
  Administered 2023-12-27 – 2024-01-02 (×25): 3 mL via RESPIRATORY_TRACT
  Filled 2023-12-27 (×25): qty 3

## 2023-12-27 MED ORDER — DEXTROSE 50 % IV SOLN: 1.0000 | Freq: Once | INTRAVENOUS | Status: DC

## 2023-12-27 MED ORDER — FENTANYL CITRATE PF 50 MCG/ML IJ SOSY: 25.0000 ug | PREFILLED_SYRINGE | INTRAMUSCULAR | Status: DC | PRN

## 2023-12-27 MED ORDER — LACTATED RINGERS IV BOLUS: 1000.0000 mL | Freq: Once | INTRAVENOUS | Status: DC

## 2023-12-27 MED ORDER — ROCURONIUM BROMIDE 10 MG/ML (PF) SYRINGE
1.0000 mg/kg | PREFILLED_SYRINGE | Freq: Once | INTRAVENOUS | Status: AC
  Administered 2023-12-27: 123.7 mg via INTRAVENOUS
  Filled 2023-12-27: qty 20

## 2023-12-27 MED ORDER — IPRATROPIUM-ALBUTEROL 0.5-2.5 (3) MG/3ML IN SOLN: 3.0000 mL | Freq: Four times a day (QID) | RESPIRATORY_TRACT | Status: DC | PRN

## 2023-12-27 MED ORDER — DOCUSATE SODIUM 50 MG/5ML PO LIQD
100.0000 mg | Freq: Two times a day (BID) | ORAL | Status: DC
  Administered 2023-12-27 – 2023-12-31 (×8): 100 mg
  Filled 2023-12-27 (×8): qty 10

## 2023-12-27 MED ORDER — FAMOTIDINE 20 MG PO TABS
20.0000 mg | ORAL_TABLET | Freq: Two times a day (BID) | ORAL | Status: DC
  Administered 2023-12-27 – 2023-12-28 (×2): 20 mg
  Filled 2023-12-27 (×2): qty 1

## 2023-12-27 MED ORDER — SODIUM CHLORIDE 0.9 % IV SOLN
2.0000 g | Freq: Once | INTRAVENOUS | Status: AC
  Administered 2023-12-27: 2 g via INTRAVENOUS
  Filled 2023-12-27: qty 10

## 2023-12-27 MED ORDER — PROPOFOL 1000 MG/100ML IV EMUL
0.0000 ug/kg/min | INTRAVENOUS | Status: DC
  Administered 2023-12-27: 5 ug/kg/min via INTRAVENOUS
  Administered 2023-12-27: 35 ug/kg/min via INTRAVENOUS
  Administered 2023-12-28: 10 ug/kg/min via INTRAVENOUS
  Administered 2023-12-28 (×2): 40 ug/kg/min via INTRAVENOUS
  Administered 2023-12-29: 35 ug/kg/min via INTRAVENOUS
  Administered 2023-12-29: 30 ug/kg/min via INTRAVENOUS
  Administered 2023-12-29: 35 ug/kg/min via INTRAVENOUS
  Administered 2023-12-29 – 2023-12-31 (×5): 25 ug/kg/min via INTRAVENOUS
  Filled 2023-12-27 (×14): qty 100

## 2023-12-27 MED ORDER — VANCOMYCIN HCL IN DEXTROSE 1-5 GM/200ML-% IV SOLN
1000.0000 mg | Freq: Once | INTRAVENOUS | Status: AC
  Administered 2023-12-27: 1000 mg via INTRAVENOUS
  Filled 2023-12-27: qty 200

## 2023-12-27 MED ORDER — PIPERACILLIN-TAZOBACTAM IN DEX 2-0.25 GM/50ML IV SOLN
2.2500 g | Freq: Three times a day (TID) | INTRAVENOUS | Status: DC
  Administered 2023-12-27: 2.25 g via INTRAVENOUS
  Filled 2023-12-27 (×2): qty 50

## 2023-12-27 NOTE — ED Notes (Signed)
Pt intubated @this  time. + color change. 7.5, 25 lips

## 2023-12-27 NOTE — Progress Notes (Signed)
 1530 Received brief patient report. 1600 Existing foley replaced with new #16 foley. Blood retrieved not urine. 1630 NP informed of no urine output. Verbal order to irrigate foley received from NP.  Foley irrigated with 120 ml of normal saline. Urine remains blood tinged but flowing freely. Patient more alert ,following command and nodding head to questions. 1700 Patient constantly setting off ventilator alarms. NP notified. 1720 Started on Propofol  as ordered. Slowly titrated due to age and blood pressure 1815 Propofol  increased as needed to prevent patient fighting ventilator. Blood pressure slowly decreasing 1906 Started patient on low dose Levophed  for blood pressure per charge nurse.

## 2023-12-27 NOTE — ED Notes (Signed)
Ketamine given @this  time

## 2023-12-27 NOTE — ED Notes (Signed)
RT bedside.

## 2023-12-27 NOTE — ED Notes (Addendum)
 Roc 123mg  given @ this time, Pt being bagged at this time via RT

## 2023-12-27 NOTE — Telephone Encounter (Signed)
 Rehab calling this morning to ask if Dr Gasper can direct admit at [facility].  Pt is having breathing issues, and his labs came back concerning.  The family does not want him to go to [facility]. But the nurse Scarlette) at pt's current inpatinet rehab location, states they cannot manage the pt's care, and need him to go to the hospital. Please call the daughter w/ an update.

## 2023-12-27 NOTE — ED Notes (Signed)
RT unable to get ABG, asked RN to put order for VBG.

## 2023-12-27 NOTE — H&P (Signed)
 NAME:  Daniel Carr, MRN:  [id number], DOB:  [birth year], LOS: 0 ADMISSION DATE:  12/27/2023, CONSULTATION DATE: 12/27/2023 REFERRING MD: Dr. Dicky, CHIEF COMPLAINT: Shortness of Breath    History of Present Illness:  This is an 88 yo male who presented to [facility] ER via EMS on 12/31 from [facility] with altered mental status and acute respiratory failure.  Pts family reports he has had worsening bilateral lower extremity edema and decreased urinary output.  His PCP increased his outpatient lasix  dose to 40 mg daily, however his urinary output did not improve.  Pts granddaughter reports he also c/o chills and fevers.  He was recently hospitalized at [facility] on 12/02/23 with a right humerus fracture from a mechanical fall on 12/3 and evaluated by ortho who recommended a sling for comfort and outpatient follow-up.  However, during presentation he was found to have progressive dyspnea; severe pulmonary hypertension; hyponatremia; acute blood loss anemia suspected secondary to right chest wall hematoma and acute kidney injury on chronic kidney disease requiring hospital admission.  Following treatment he was discharged on 12/17 to [facility] for rehab and instructed not to resume his outpatient apixaban  until 12/18.  His daughter reports the pts apixaban  has been resumed as instructed.    ED Course  Upon arrival to the ER pt in severe respiratory distress, hypotensive, and minimally responsive requiring mechanical intubation.  Significant lab results were: Na+ 125/K+ 5.9/chloride 86/BUN 72/creatinine 4.46/calcium 8.7/albumin  2.5/AST 143/ALT 89/troponin 550/lactic acid 2.4/wbc 15.9/hgb 11.2/PT 25.0/INR 2.2/APTT 48.  CXR concerning for possible small right pleural effusion.  He met sepsis criteria and received: 2L saline bolus; vancomycin , and aztreonam .  Despite iv fluid resuscitation pt remained hypotensive requiring levophed  gtt.  PCCM team contacted for ICU admission.   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    Objective   Blood pressure 110/74, pulse (!) 106, resp. rate 16, height 6' 1 (1.854 m), weight 123.7 kg, SpO2 100%.    Vent Mode: PRVC FiO2 (%):  [40 %] 40 % Set Rate:  [16 bmp] 16 bmp Vt Set:  [500 mL] 500 mL PEEP:  [5 cmH20] 5 cmH20  No intake or output data in the 24 hours ending 12/27/23 1320 Filed Weights   12/27/23 1209  Weight: 123.7 kg   Examination: General: Acute on chronically-ill appearing male, NAD mechanically intubated  HENT: Supple, no JVD present  Lungs: Diminished throughout, even, non labored  Cardiovascular: NSR, s1s2, no r/g, 2+ radial/1+ distal pulses, 3+ bilateral lower extremity pitting edema  Abdomen: +BS x4, obese, soft, non distended  Extremities: Right arm sling present secondary to recent right humerus fracture with 2+ edema  Skin: Intact no rashes/lesions/pressure ulcers present  Neuro: Unresponsive RASS -5, not following commands, bilateral pupils pinpoint, no corneal/gag reflexes present  GU: Outpatient foley exchanged; new indwelling foley draining tea colored urine   Resolved Hospital Problem list     Assessment & Plan:   #Acute  toxic metabolic encephalopathy  - Avoid sedating medications as able  - Maintain RASS goal 0 to -1 - PAD protocol to maintain RASS goal: propofol  gtt and prn fentanyl   - Ammonia level pending  - WUA daily - If mentation does not improve in the next 72 hrs will order MRI Brain   #Hypotension suspect secondary to sepsis and dehydration  #HFpEF  #CTEPH   Hx: HTN, aortic stenosis, PE, and HLD  - Continuous telemetry monitoring  - Trend troponin until peaked  - BNP pending  - Prn levophed  gtt to maintain map 65 or higher  - Heparin  gtt: dosing per pharmacy  - Cooxy pending  - US  Venous right upper extremity and bilateral lower extremities to rule out DVT  - Will consider heart failure team consultation   #Acute hypercapnic respiratory failure  #AECOPD  #Mechanical intubation  - Full vent support for now: vent settings reviewed and established  - Continue lung protective strategies  - Maintain plateau pressures less than 30 cm H2O  - VAP bundle implemented - SBT once all parameters met  - Intermittent CXR's and ABG's - Scheduled and prn bronchodilator therapy  - IV steroids   #Acute kidney injury suspect secondary to ATN and diuretic therapy  #Hyperkalemia  #Acute on chronic hyponatremia  #Lactic acidosis  - Trend BMP and lactic acid  - Replace electrolytes as indicated  - Strict intake and output  - Urine osmolality/Na+ and serum osmolality pending  - Shifting measures administered for hyperkalemia with repeat BMP pending @2000   #Pneumonia suspect aspiration  - Trend WBC and monitor fever curve - Trend PCT  - Follow cultures  - Continue zosyn  pending culture results and sensitivities   #Transaminitis  - Trend hepatic function panel  - Avoid hepatotoxic medications   #Anemia of chronic disease  - Trend CBC - Monitor for s/sx for bleeding  - Transfuse for hgb <7  #Previous right humerus fracture  - Venous US  to r/o VTE  - Continue sling per outpatient ortho  recommendations  - Follow-up with outpatient orthopedic  #Hypothyroidism  - TSH and free T4 pending   #Endo - CBG's q4hrs  - Follow hypo/hyperglycemic protocol  - Target range 140 to 180  Best Practice (right click and Reselect all SmartList Selections daily)   Diet/type: NPO DVT prophylaxis systemic heparin  Pressure ulcer(s): N/A GI prophylaxis: H2B Lines: N/A Foley:  Yes, and it is still needed: outpatient foley exchanged 12/27/23 Code Status:  full code Last date of multidisciplinary goals of care discussion [N/A]  12/31: Updated pts family regarding pts condition and current plan of care  Labs   CBC: Recent Labs  Lab 12/27/23 1202  WBC 15.9*  NEUTROABS 12.3*  HGB 11.2*  HCT 36.3*  MCV 101.4*  PLT 151    Basic Metabolic Panel: Recent Labs  Lab 12/27/23 1202  NA 125*  K 5.9*  CL 86*  CO2 26  GLUCOSE 99  BUN 72*  CREATININE 4.46*  CALCIUM 8.7*   GFR: Estimated Creatinine Clearance: 16.4 mL/min (A) (by C-G formula based on SCr of 4.46 mg/dL (H)). Recent Labs  Lab 12/27/23 1202  WBC 15.9*  LATICACIDVEN 2.4*    Liver Function Tests: Recent Labs  Lab 12/27/23 1202  AST 143*  ALT 89*  ALKPHOS 80  BILITOT 0.9  PROT 6.8  ALBUMIN   2.5*   Recent Labs  Lab 12/27/23 1202  LIPASE 25   No results for input(s): AMMONIA in the last 168 hours.  ABG No results found for: PHART, PCO2ART, PO2ART, HCO3, TCO2, ACIDBASEDEF, O2SAT   Coagulation Profile: Recent Labs  Lab 12/27/23 1202  INR 2.2*    Cardiac Enzymes: No results for input(s): CKTOTAL, CKMB, CKMBINDEX, TROPONINI in the last 168 hours.  HbA1C: No results found for: HGBA1C  CBG: No results for input(s): GLUCAP in the last 168 hours.  Review of Systems:   Unable to assess pt mechanically intubated   Past Medical History:  He,  has a past medical history of Arthritis, childhood infection, Glaucoma, Hypertension, Hypothyroidism, Sinus problem, and Ulnar  neuropathy.   Surgical History:   Past Surgical History:  Procedure Laterality Date   APPENDECTOMY     CATARACT EXTRACTION Bilateral    COLONOSCOPY     JOINT REPLACEMENT Right    KNEE ARTHROPLASTY Left 12/24/2020   Procedure: COMPUTER ASSISTED TOTAL KNEE ARTHROPLASTY;  Surgeon: Mardee Lynwood SQUIBB, MD;  Location: [facility] ORS;  Service: Orthopedics;  Laterality: Left;  3RD CASE   KNEE SURGERY Right 10/04/2011   replacement   NM MYOVIEW  ([facility] HX)  07/21/2017   [place]. No ischemia.    RETINAL DETACHMENT REPAIR W/ SCLERAL BUCKLE LE     TEE WITHOUT CARDIOVERSION N/A 04/29/2023   Procedure: TRANSESOPHAGEAL ECHOCARDIOGRAM (TEE);  Surgeon: Hilarie Rocher, MD;  Location: [facility] ORS;  Service: Cardiovascular;  Laterality: N/A;   TONSILLECTOMY       Social History:   reports that he quit smoking about 54 years ago. His smoking use included cigarettes. He started smoking about 60 years ago. He has a 1.5 pack-year smoking history. He has never used smokeless tobacco. He reports that he does not drink alcohol  and does not use drugs.   Family History:  His family history includes Hypertension in his father and mother; Stroke in his father and sister.   Allergies No Known Allergies   Home Medications  Prior to Admission medications   Medication Sig Start Date End Date Taking? Authorizing Provider  albuterol  (VENTOLIN  HFA) 108 (90 Base) MCG/ACT inhaler SMARTSIG:2 Puff(s) By Mouth As Directed    [provider]  aspirin  81 MG EC tablet Take 1 tablet (81 mg total) by mouth daily. Swallow whole. 07/06/21   Gasper Nancyann BRAVO, MD  azelastine  (ASTELIN ) 0.1 % nasal spray Place 1 spray into the nose daily as needed (allergies). 11/09/19   [provider]  brimonidine  (ALPHAGAN ) 0.2 % ophthalmic solution Place 1 drop into both eyes in the morning and at bedtime. 03/26/15   [provider]  clotrimazole -betamethasone  (LOTRISONE ) cream APPLY TOPICALLY ONCE DAILY Patient not taking: Reported on  03/21/2023 04/05/22   Gasper Nancyann BRAVO, MD  dorzolamide  (TRUSOPT ) 2 % ophthalmic solution Apply to eye. 11/08/21   [provider]  DULoxetine  (CYMBALTA ) 30 MG capsule Take 30 mg by mouth daily. 09/13/23 12/11/23  [provider]  dutasteride  (AVODART ) 0.5 MG capsule Take 1 capsule by mouth once daily 12/23/22   Gasper Nancyann BRAVO, MD  furosemide  (LASIX ) 20 MG tablet Take 20 mg by mouth daily.    [provider]  hydroxychloroquine (PLAQUENIL) 200 MG tablet Take 200 mg by mouth daily. 09/07/23   [provider]  ipratropium (ATROVENT ) 0.06 % nasal spray Place 1 spray into both nostrils 2 (two) times daily as needed for rhinitis. 03/15/18   [provider]  latanoprost  (XALATAN ) 0.005 % ophthalmic  solution SMARTSIG:In Eye(s) 03/25/22   [provider]  levocetirizine (XYZAL ) 5 MG tablet Take 5 mg by mouth every evening.    [provider]  levothyroxine  (SYNTHROID ) 150 MCG tablet Take 1 tablet by mouth once daily 12/23/22   [name], [name] E, MD  losartan (COZAAR) 25 MG tablet Take 25 mg by mouth daily.    [provider]  lovastatin  (MEVACOR ) 40 MG tablet Take 1 tablet by mouth once daily 05/09/23   [name], [name] E, MD  Multiple Vitamin (DAILY VITAMIN PO) Take 1 tablet by mouth daily.    [provider]  pregabalin  (LYRICA ) 50 MG capsule Take 1 capsule (50 mg total) by mouth 2 (two) times daily. 09/28/23   Gasper Nancyann BRAVO, MD  vitamin B-12 (CYANOCOBALAMIN) 100 MCG tablet Take 100 mcg by mouth daily. Unsure of dose  is OTC med Patient not taking: Reported on 09/22/2023    [provider]     Critical care time: 70 minutes     Lonell Moose, AGNP  Pulmonary/Critical Care Pager [phone] (please enter 7 digits) PCCM Consult Pager [phone] (please enter 7 digits)

## 2023-12-27 NOTE — Consult Note (Signed)
 PHARMACY - ANTICOAGULATION CONSULT NOTE  Pharmacy Consult for IV Heparin  Indication:  CTEPH / Hx PE on apixaban  prior to admission  Patient Measurements: Height: 6' 1 (185.4 cm) Weight: 107.8 kg (237 lb 10.5 oz) IBW/kg (Calculated) : 79.9 Heparin  Dosing Weight: 102.3 kg  Labs: Recent Labs    12/27/23 1202  HGB 11.2*  HCT 36.3*  PLT 151  APTT 48*  LABPROT 25.0*  INR 2.2*  CREATININE 4.46*  TROPONINIHS 550*    Estimated Creatinine Clearance: 15.3 mL/min (A) (by C-G formula based on SCr of 4.46 mg/dL (H)).   Medical History: Past Medical History:  Diagnosis Date   Arthritis    childhood infection    chicken pox, measels, and mumps   Glaucoma    Hypertension    Hypothyroidism    Sinus problem    Ulnar neuropathy    bilateral    Medications:  Apixaban  5 mg BID prior to admission. Last dose was 12/27/23 at 0900  Assessment: 88 y/o M with medical history as above and including Hx PE / CTEPH on apixaban  who is admitted with acute on chronic respiratory failure and renal failure. Apixaban  on hold and pharmacy consulted to initiate and manage heparin  in the meantime.  Baseline aPTT 48s (elevated), INR 2.2 (elevated), heparin  level pending. CBC notable for anemia with Hgb 11.2 but which appears at or better than baseline.  Goal of Therapy:  Heparin  level 0.3-0.7 units/ml aPTT 66 - 102 seconds Monitor platelets by anticoagulation protocol: Yes   Plan:  --Will start heparin  at 1200 units/hr on 12/31 at 2100, no bolus --Check aPTT / HL 8 hours from initiation of infusion. Follow aPTT until correlation established with HL and then HL monitoring only --Daily CBC per protocol while on IV heparin   [name] 12/27/2023,3:44 PM

## 2023-12-27 NOTE — Progress Notes (Signed)
[facility]link is following code sepsis 

## 2023-12-27 NOTE — ED Provider Notes (Signed)
.     Sharyn Creamer, MD 12/27/23 [phone]

## 2023-12-27 NOTE — Progress Notes (Signed)
 PHARMACY CONSULT NOTE   Pharmacy Consult for Electrolyte Monitoring and Replacement   Recent Labs: Potassium (mmol/L)  Date Value  12/27/2023 5.9 (H)   Magnesium  (mg/dL)  Date Value  89/97/7975 2.0   Calcium (mg/dL)  Date Value  87/68/7975 8.7 (L)   Albumin  (g/dL)  Date Value  87/68/7975 2.5 (L)  09/28/2023 4.0   Phosphorus (mg/dL)  Date Value  89/97/7975 3.0   Sodium (mmol/L)  Date Value  12/27/2023 125 (L)  09/28/2023 132 (L)     Assessment: Patient is a 88 year old male who presented to ED with acute hypoxic respiratory failure. Pharmacy was consulted to monitor and replace electrolytes as needed.  Goal of Therapy:  Electrolytes WNL  Plan:  -- Potassium elevated at 5.9 -- Give 1 dose of Lokelma  10 g  -- Check electrolytes with AM labs  Lum VEAR Mania ,PharmD Clinical Pharmacist 12/27/2023 1:38 PM

## 2023-12-27 NOTE — ED Triage Notes (Signed)
 BIBEMS, Pt is coming from [facility]. Pt c/o SOB and increased lethargy. Baseline GCS 15, GCS 9 now. Dx with PNA on Tuesday. Baseline 2L [place], SNF placed Pt on 6L [place] improved to 88%, EMS placed on NRB @15L  @100 %. Facility notes urinary retention, Pt has chronic indwelling foley. Pt has only urinated over the last 3 days.

## 2023-12-27 NOTE — ED Triage Notes (Addendum)
 BIBEMS, coming from [facility]. SOB since Friday. Dx PNA on Sunday. Today Pt was lethargic and clammy. GCS 15, Reports urinary retention of for the past 3 days. Pt was 88% on 6L, placed on 15L NRB @100 %. Baseline of 2L [place]. Pt is in sling for broken shoulder last month. 22G LAC. Pt is able to normally able to weight bear. NSR, 106/80. 99.47f axillary. BGL: 108. Pt received 2 albuterol  tx with EMS.

## 2023-12-27 NOTE — Telephone Encounter (Signed)
Rosey Bath - daughter- given message. States she will have pt. Sent to ED.

## 2023-12-27 NOTE — ED Provider Notes (Signed)
 [facility] Provider Note    Event Date/Time   First MD Initiated Contact with Patient 12/27/23 1147     (approximate)   History   Shortness of breath, altered mental status  EM caveat: Altered mental status.  History is provided somewhat by chart review as well as discussion with patient family  HPI  Daniel Carr is a 88 y.o. male recently hospitalized at [facility] for a right arm fracture, additionally history of previous pulmonary embolisms now on blood thinner   EMS reports they were called as the patient has not had urine output about 3 days noted to be short of breath with altered mental status at care facility  Physical Exam   Triage Vital Signs: ED Triage Vitals [12/27/23 1152]  Encounter Vitals Group     BP (!) 79/51     Systolic BP Percentile      Diastolic BP Percentile      Pulse Rate 90     Resp (!) 40     Temp      Temp src      SpO2 100 %     Weight      Height      Head Circumference      Peak Flow      Pain Score      Pain Loc      Pain Education      Exclude from Growth Chart    Severe hypotension on arrival  Most recent vital signs: Vitals:   12/27/23 1235 12/27/23 1444  BP: 110/74 (!) 158/99  Pulse: (!) 106 86  Resp: 16 (!) 22  Temp:  (!) 96.4 F (35.8 C)  SpO2: 100%     General: Lethargic, able to barely voice his name and date of birth but confirmed name and date of birth correct.  He is not oriented to location CV:  Valgus peripheral perfusion, normal tones Resp:  Tachypnea with poor inspiratory effort, diminished lung sounds throughout.  Some rhonchi noted in the right lower lobe Abd:  No distention.  Soft nontender no obvious distention Other:  Moderate bilateral lower extremity edema  Patient has obvious respiratory distress, is also severely hypertensive  Decision made to further resuscitate, initiate vasopressor and fluid support prior to proceeding with evaluation for possible intubation.  He is  protecting his airway but appears to be in extremis and will be a poor candidate for BiPAP given the degree of somnolence   ED Results / Procedures / Treatments   Labs (all labs ordered are listed, but only abnormal results are displayed) Labs Reviewed  LACTIC ACID, PLASMA - Abnormal; Notable for the following components:      Result Value   Lactic Acid, Venous 2.4 (*)    All other components within normal limits  COMPREHENSIVE METABOLIC PANEL - Abnormal; Notable for the following components:   Sodium 125 (*)    Potassium 5.9 (*)    Chloride 86 (*)    BUN 72 (*)    Creatinine, Ser 4.46 (*)    Calcium 8.7 (*)    Albumin  2.5 (*)    AST 143 (*)    ALT 89 (*)    GFR, Estimated 12 (*)    All other components within normal limits  CBC WITH DIFFERENTIAL/PLATELET - Abnormal; Notable for the following components:   WBC 15.9 (*)    RBC 3.58 (*)    Hemoglobin 11.2 (*)    HCT 36.3 (*)    MCV  101.4 (*)    nRBC 0.9 (*)    Neutro Abs 12.3 (*)    Monocytes Absolute 2.0 (*)    Abs Immature Granulocytes 0.22 (*)    All other components within normal limits  PROTIME-INR - Abnormal; Notable for the following components:   Prothrombin Time 25.0 (*)    INR 2.2 (*)    All other components within normal limits  APTT - Abnormal; Notable for the following components:   aPTT 48 (*)    All other components within normal limits  BLOOD GAS, VENOUS - Abnormal; Notable for the following components:   pH, Ven 7.17 (*)    pCO2, Ven 75 (*)    pO2, Ven 48 (*)    Acid-base deficit 3.0 (*)    All other components within normal limits  TROPONIN I (HIGH SENSITIVITY) - Abnormal; Notable for the following components:   Troponin I (High Sensitivity) 550 (*)    All other components within normal limits  CULTURE, BLOOD (ROUTINE X 2)  CULTURE, BLOOD (ROUTINE X 2)  SARS CORONAVIRUS 2 BY RT PCR  MRSA NEXT GEN BY PCR, NASAL  CULTURE, RESPIRATORY W GRAM STAIN  RESP PANEL BY RT-PCR (RSV, FLU A&B, COVID)  RVPGX2   LIPASE, BLOOD  GLUCOSE, CAPILLARY  LACTIC ACID, PLASMA  BRAIN NATRIURETIC PEPTIDE  URINALYSIS, W/ REFLEX TO CULTURE (INFECTION SUSPECTED)  BLOOD GAS, VENOUS  TSH  T4, FREE  AMMONIA  MAGNESIUM   PHOSPHORUS  COOXEMETRY PANEL  PROCALCITONIN  BASIC METABOLIC PANEL  HEPARIN  LEVEL (UNFRACTIONATED)  OSMOLALITY, URINE  SODIUM, URINE, RANDOM  OSMOLALITY  TROPONIN I (HIGH SENSITIVITY)  TROPONIN I (HIGH SENSITIVITY)     EKG  Inter by me at noon heart rate 90 QRS 120 QTc 420 Sinus rhythm occasional PAC.  No evidence of frank ischemia   RADIOLOGY  Chest x-ray interpreted by me personally as negative for obvious infiltrate.  Endotracheal tube in good position, orogastric tube trustees below the diaphragm  CT HEAD WO CONTRAST ( ) Result Date: 12/27/2023 CLINICAL DATA:  Mental status change, unknown cause EXAM: CT HEAD WITHOUT CONTRAST TECHNIQUE: Contiguous axial images were obtained from the base of the skull through the vertex without intravenous contrast. RADIATION DOSE REDUCTION: This exam was performed according to the departmental dose-optimization program which includes automated exposure control, adjustment of the mA and/or kV according to patient size and/or use of iterative reconstruction technique. COMPARISON:  CT head May 06, 2011. FINDINGS: Brain: No evidence of acute infarction, hemorrhage, hydrocephalus, extra-axial collection or mass lesion/mass effect. Cerebral atrophy. Vascular: No hyperdense vessel. Skull: No acute fracture. Sinuses/Orbits: Mild paranasal sinus mucosal thickening. No acute orbital findings. Right scleral buckle. Other: No mastoid effusions. IMPRESSION: 1. No evidence of acute intracranial abnormality. 2.  Cerebral Atrophy (ICD10-G31.9). Electronically Signed   By: Gilmore GORMAN Molt M.D.   On: 12/27/2023 16:08   DG Abdomen 1 View Result Date: 12/27/2023 CLINICAL DATA:  ET and orogastric tube placement EXAM: ABDOMEN - 1 VIEW COMPARISON:  None Available.  FINDINGS: Single view of the left upper quadrant and lower left chest demonstrates an NG tube terminating in the upper abdomen, likely within the gastric body. Anatomy is distorted by the extent of left hemidiaphragm elevation. No gross free intraperitoneal air. IMPRESSION: A nasogastric tube terminating in the upper abdomen, likely within the gastric body. Anatomic distortion secondary to left hemidiaphragm elevation. Electronically Signed   By: Rockey Kilts M.D.   On: 12/27/2023 14:45   DG Chest Port 1 View Result Date: 12/27/2023 CLINICAL  DATA:  Possible sepsis. Status post ET and nasogastric tube placement EXAM: PORTABLE CHEST 1 VIEW COMPARISON:  03/02/2023 FINDINGS: Marked left hemidiaphragm elevation. Endotracheal tube terminates 5.0 cm above carina. Nasogastric tube terminates in the upper abdomen, likely in the gastric body. Apical lordotic positioning with minimal exclusion of the upper chest. Mild cardiomegaly. No pneumothorax. Cannot exclude layering small right pleural effusion. Nonspecific interstitial coarsening. Left base scarring or atelectasis. IMPRESSION: Appropriate position of endotracheal tube. Nasogastric tube likely terminates in the gastric body. Anatomy is distorted by markedly elevated left hemidiaphragm. Somewhat technique limited portable radiograph. Cannot exclude small layering right pleural effusion. Cardiomegaly. Electronically Signed   By: Rockey Kilts M.D.   On: 12/27/2023 14:43      PROCEDURES:  Critical Care performed: Yes, see critical care procedure note(s)  Procedure Name: Intubation Date/Time: 12/27/2023 4:18 PM  Performed by: Dicky Anes, MDPre-anesthesia Checklist: Patient identified Oxygen  Delivery Method: Ambu bag Preoxygenation: Pre-oxygenation with 100% oxygen  Induction Type: Rapid sequence Ventilation: Mask ventilation without difficulty Laryngoscope Size: 4 and Glidescope Grade View: Grade I Tube size: 7.5 mm Number of attempts: 1 Placement  Confirmation: Positive ETCO2, CO2 detector and Breath sounds checked- equal and bilateral Secured at: 25 cm Tube secured with: ETT holder Dental Injury: Teeth and Oropharynx as per pre-operative assessment  Comments: Patient's dentures given to nurse.  Patient intubated without difficulty or complication.  No evidence of hypoxia or  hypotension after.  Fentanyl  and Versed  ordered for sedation.        MEDICATIONS ORDERED IN ED: Medications  0.9 %  sodium chloride  infusion (has no administration in time range)  norepinephrine  (LEVOPHED ) 4mg  in (0.016 mg/mL) premix infusion (0 mcg/min Intravenous Stopped 12/27/23 1440)  lactated ringers  bolus 1,000 mL (has no administration in time range)  docusate (COLACE) 50 MG/5ML liquid 100 mg (has no administration in time range)  polyethylene glycol (MIRALAX  / GLYCOLAX ) packet 17 g (has no administration in time range)  famotidine  (PEPCID ) tablet 20 mg (has no administration in time range)  docusate (COLACE) 50 MG/5ML liquid 100 mg (has no administration in time range)  polyethylene glycol (MIRALAX  / GLYCOLAX ) packet 17 g (has no administration in time range)  propofol  (DIPRIVAN ) 1000 MG/100ML infusion (has no administration in time range)  fentaNYL  (SUBLIMAZE ) injection 25 mcg (has no administration in time range)  fentaNYL  (SUBLIMAZE ) injection 25-100 mcg (has no administration in time range)  insulin  aspart (novoLOG ) injection 10 Units (has no administration in time range)    And  dextrose  50 % solution 50 mL (has no administration in time range)  piperacillin -tazobactam (ZOSYN ) IVPB 2.25 g (has no administration in time range)  sodium zirconium cyclosilicate  (LOKELMA ) packet 10 g (has no administration in time range)  ipratropium-albuterol  (DUONEB) 0.5-2.5 (3) MG/3ML nebulizer solution 3 mL (has no administration in time range)  methylPREDNISolone  sodium succinate (SOLU-MEDROL ) 40 mg/mL injection 40 mg (has no administration in time range)   ipratropium-albuterol  (DUONEB) 0.5-2.5 (3) MG/3ML nebulizer solution 3 mL (has no administration in time range)  heparin  ADULT infusion 100 units/mL (25000 units/250mL) (has no administration in time range)  sodium chloride  0.9 % bolus 1,000 mL (1,000 mLs Intravenous New Bag/Given 12/27/23 1155)  ketamine  50 mg in normal saline 5 mL (10 mg/mL) syringe (186 mg Intravenous Given 12/27/23 1225)  rocuronium  (ZEMURON ) injection 123.7 mg (123.7 mg Intravenous Given 12/27/23 1225)  aztreonam  (AZACTAM ) 2 g in sodium chloride  0.9 % 100 mL IVPB (0 g Intravenous Stopped 12/27/23 1343)  vancomycin  (VANCOCIN ) IVPB 1000 mg/200  mL premix (1,000 mg Intravenous New Bag/Given 12/27/23 1346)     IMPRESSION / MDM / ASSESSMENT AND PLAN / ED COURSE  I reviewed the triage vital signs and the nursing notes.                              Differential diagnosis includes, but is not limited to, pneumonia, infection, urinary tract infection with Foley catheter in place, acute anemia metabolic disturbance acute kidney injury dehydration etc.  The patient appears critically ill, he is protecting his airway somewhat but extremely attended. After resuscitation blood pressure improving decision to intubate him which was successful.  Blood pressure improving with fluid resuscitation peripheral pressors.    [place] rhonchi, clinical assessment 1 respiratory distress oxygen  requirement high suspicion for possible pulmonary infection though this is not yet affirmed.  But given the patient's presentation we will initiate broad-spectrum antibiotic leverage including Azactam  and vancomycin    Patient's family including daughter at the bedside.  Patient is full code.  They are understanding of the degree of his critical illness  Appreciate consultation by ICU team Dr. Parris  Patient's presentation is most consistent with acute presentation with potential threat to life or bodily function.  The patient is on the cardiac  monitor to evaluate for evidence of arrhythmia and/or significant heart rate changes.  Low pretest probability for thromboembolism, currently on active coagulation.  INR 2.2.  Labs demonstrate leukocytosis, acute kidney injury and hyperkalemia being addressed with fluid resuscitation and further care and treatment as per direction of the ICU team who is now assumed care  Sepsis reassessment completed, patient condition is now improved with blood pressure improving, resting calmly currently on mechanical ventilation.        FINAL CLINICAL IMPRESSION(S) / ED DIAGNOSES   Final diagnoses:  AKI (acute kidney injury) (HCC)  Acute hypoxemic respiratory failure (HCC)     Rx / DC Orders   ED Discharge Orders     None        Note:  This document was prepared using Dragon voice recognition software and may include unintentional dictation errors.   Dicky Anes, MD 12/27/23 [phone]

## 2023-12-27 NOTE — Consult Note (Signed)
 PHARMACY CONSULT NOTE - ELECTROLYTES  Pharmacy Consult for Electrolyte Monitoring and Replacement   Recent Labs: Potassium (mmol/L)  Date Value  12/27/2023 5.9 (H)   Magnesium  (mg/dL)  Date Value  89/97/7975 2.0   Calcium (mg/dL)  Date Value  87/68/7975 8.7 (L)   Albumin  (g/dL)  Date Value  87/68/7975 2.5 (L)  09/28/2023 4.0   Phosphorus (mg/dL)  Date Value  89/97/7975 3.0   Sodium (mmol/L)  Date Value  12/27/2023 125 (L)  09/28/2023 132 (L)   Height: 6' 1 (185.4 cm) Weight: 107.8 kg (237 lb 10.5 oz) IBW/kg (Calculated) : 79.9 Estimated Creatinine Clearance: 15.3 mL/min (A) (by C-G formula based on SCr of 4.46 mg/dL (H)).  Assessment  Daniel Carr is a 88 y.o. male presenting with acute on chronic respiratory failure. PMH significant for HTN, hypothyroidism, HFpEF, COPD, aortic stenosis, Hx PE / CTEPH. Pharmacy has been consulted to monitor and replace electrolytes.  Diet: NPO MIVF: N/A Pertinent medications: Insulin  / dextrose  and Lokelma  ordered for hyperK+  Goal of Therapy: Electrolytes within normal limits  Plan:  Repeat electrolytes at 2000 to ensure hyperkalemia resolved  Thank you for allowing pharmacy to be a part of this patient's care.  [name] 12/27/2023 4:01 PM

## 2023-12-27 NOTE — Progress Notes (Signed)
 CODE SEPSIS - PHARMACY COMMUNICATION  **Broad Spectrum Antibiotics should be administered within 1 hour of Sepsis diagnosis**  Time Code Sepsis Called/Page Received: 1234  Antibiotics Ordered: Aztreonam , vancomycin   Time of 1st antibiotic administration: 1251  Additional action taken by pharmacy: N/A  If necessary, Name of Provider/Nurse Contacted: N/A    Lum VEAR Mania ,PharmD Clinical Pharmacist  12/27/2023  12:34 PM se

## 2023-12-28 ENCOUNTER — Inpatient Hospital Stay: Payer: Medicare HMO

## 2023-12-28 ENCOUNTER — Inpatient Hospital Stay (HOSPITAL_COMMUNITY)
Admit: 2023-12-28 | Discharge: 2023-12-28 | Disposition: A | Discharge status: Home / Self Care | Payer: Medicare HMO | Attending: Pulmonary Disease | Admitting: Pulmonary Disease

## 2023-12-28 DIAGNOSIS — R7881 Bacteremia: Secondary | ICD-10-CM

## 2023-12-28 LAB — ECHOCARDIOGRAM COMPLETE
AV Mean grad: 20 mm[Hg]
AV Peak grad: 35.5 mm[Hg]
Ao pk vel: 2.98 m/s
Height: 73 in
S' Lateral: 2.9 cm
Weight: 3809.55 [oz_av]

## 2023-12-28 LAB — BLOOD CULTURE ID PANEL (REFLEXED) - BCID2

## 2023-12-28 LAB — BLOOD GAS, ARTERIAL
Acid-Base Excess: 3 mmol/L — ABNORMAL HIGH (ref 0.0–2.0)
Bicarbonate: 25.5 mmol/L (ref 20.0–28.0)
FIO2: 40 %
MECHVT: 500 mL
Mechanical Rate: 26
O2 Saturation: 99.8 %
PEEP: 5 cmH2O
Patient temperature: 37
pCO2 arterial: 32 mm[Hg] (ref 32–48)
pH, Arterial: 7.51 — ABNORMAL HIGH (ref 7.35–7.45)
pO2, Arterial: 164 mm[Hg] — ABNORMAL HIGH (ref 83–108)

## 2023-12-28 LAB — RENAL FUNCTION PANEL
Albumin: 2 g/dL — ABNORMAL LOW (ref 3.5–5.0)
Albumin: 2.3 g/dL — ABNORMAL LOW (ref 3.5–5.0)
Anion gap: 13 (ref 5–15)
Anion gap: 13 (ref 5–15)
BUN: 71 mg/dL — ABNORMAL HIGH (ref 8–23)
BUN: 56 mg/dL — ABNORMAL HIGH (ref 8–23)
CO2: 23 mmol/L (ref 22–32)
CO2: 23 mmol/L (ref 22–32)
Calcium: 8.1 mg/dL — ABNORMAL LOW (ref 8.9–10.3)
Calcium: 8.4 mg/dL — ABNORMAL LOW (ref 8.9–10.3)
Chloride: 91 mmol/L — ABNORMAL LOW (ref 98–111)
Chloride: 94 mmol/L — ABNORMAL LOW (ref 98–111)
Creatinine, Ser: 3.62 mg/dL — ABNORMAL HIGH (ref 0.61–1.24)
Creatinine, Ser: 2.86 mg/dL — ABNORMAL HIGH (ref 0.61–1.24)
GFR, Estimated: 16 mL/min — ABNORMAL LOW (ref >60)
GFR, Estimated: 21 mL/min — ABNORMAL LOW (ref >60)
Glucose, Bld: 124 mg/dL — ABNORMAL HIGH (ref 70–99)
Glucose, Bld: 138 mg/dL — ABNORMAL HIGH (ref 70–99)
Phosphorus: 3.8 mg/dL (ref 2.5–4.6)
Phosphorus: 3.7 mg/dL (ref 2.5–4.6)
Potassium: 4.6 mmol/L (ref 3.5–5.1)
Potassium: 4.5 mmol/L (ref 3.5–5.1)
Sodium: 127 mmol/L — ABNORMAL LOW (ref 135–145)
Sodium: 130 mmol/L — ABNORMAL LOW (ref 135–145)

## 2023-12-28 LAB — GLUCOSE, CAPILLARY
Glucose-Capillary: 122 mg/dL — ABNORMAL HIGH (ref 70–99)
Glucose-Capillary: 124 mg/dL — ABNORMAL HIGH (ref 70–99)
Glucose-Capillary: 125 mg/dL — ABNORMAL HIGH (ref 70–99)
Glucose-Capillary: 129 mg/dL — ABNORMAL HIGH (ref 70–99)
Glucose-Capillary: 132 mg/dL — ABNORMAL HIGH (ref 70–99)
Glucose-Capillary: 143 mg/dL — ABNORMAL HIGH (ref 70–99)

## 2023-12-28 LAB — APTT
aPTT: 128 s — ABNORMAL HIGH (ref 24–36)
aPTT: 48 s — ABNORMAL HIGH (ref 24–36)

## 2023-12-28 LAB — URINALYSIS, W/ REFLEX TO CULTURE (INFECTION SUSPECTED)
Bilirubin Urine: NEGATIVE
Glucose, UA: NEGATIVE mg/dL
Ketones, ur: NEGATIVE mg/dL
Leukocytes,Ua: NEGATIVE
Nitrite: NEGATIVE
Protein, ur: 100 mg/dL — AB
RBC / HPF: >50 RBC/hpf (ref 0–5)
Specific Gravity, Urine: 1.013 (ref 1.005–1.030)
pH: 5 (ref 5.0–8.0)

## 2023-12-28 LAB — SODIUM, URINE, RANDOM: Sodium, Ur: 64 mmol/L

## 2023-12-28 LAB — HEPATITIS B CORE ANTIBODY, TOTAL: Hep B Core Total Ab: NONREACTIVE

## 2023-12-28 LAB — AMMONIA: Ammonia: 31 umol/L (ref 9–35)

## 2023-12-28 LAB — HEPATITIS B SURFACE ANTIGEN: Hepatitis B Surface Ag: NONREACTIVE

## 2023-12-28 LAB — CBC
HCT: 26.5 % — ABNORMAL LOW (ref 39.0–52.0)
Hemoglobin: 9.2 g/dL — ABNORMAL LOW (ref 13.0–17.0)
MCH: 31.6 pg (ref 26.0–34.0)
MCHC: 34.7 g/dL (ref 30.0–36.0)
MCV: 91.1 fL (ref 80.0–100.0)
Platelets: 152 10*3/uL (ref 150–400)
RBC: 2.91 MIL/uL — ABNORMAL LOW (ref 4.22–5.81)
RDW: 14.2 % (ref 11.5–15.5)
WBC: 13 10*3/uL — ABNORMAL HIGH (ref 4.0–10.5)
nRBC: 0.5 % — ABNORMAL HIGH (ref 0.0–0.2)

## 2023-12-28 LAB — HEPATIC FUNCTION PANEL
ALT: 59 U/L — ABNORMAL HIGH (ref 0–44)
AST: 72 U/L — ABNORMAL HIGH (ref 15–41)
Albumin: 2.1 g/dL — ABNORMAL LOW (ref 3.5–5.0)
Alkaline Phosphatase: 63 U/L (ref 38–126)
Bilirubin, Direct: 0.3 mg/dL — ABNORMAL HIGH (ref 0.0–0.2)
Indirect Bilirubin: 0.6 mg/dL (ref 0.3–0.9)
Total Bilirubin: 0.9 mg/dL (ref 0.0–1.2)
Total Protein: 5.4 g/dL — ABNORMAL LOW (ref 6.5–8.1)

## 2023-12-28 LAB — HEPATITIS B CORE ANTIBODY, IGM: Hep B C IgM: NONREACTIVE

## 2023-12-28 LAB — HEPATITIS C ANTIBODY: HCV Ab: NONREACTIVE

## 2023-12-28 LAB — MAGNESIUM: Magnesium: 2 mg/dL (ref 1.7–2.4)

## 2023-12-28 LAB — OSMOLALITY, URINE: Osmolality, Ur: 330 mosm/kg (ref 300–900)

## 2023-12-28 LAB — TRIGLYCERIDES: Triglycerides: 161 mg/dL — ABNORMAL HIGH (ref <150)

## 2023-12-28 LAB — HEPARIN LEVEL (UNFRACTIONATED): Heparin Unfractionated: >1.1 [IU]/mL — ABNORMAL HIGH (ref 0.30–0.70)

## 2023-12-28 LAB — LACTIC ACID, PLASMA: Lactic Acid, Venous: 2.1 mmol/L (ref 0.5–1.9)

## 2023-12-28 LAB — PROCALCITONIN: Procalcitonin: 12.67 ng/mL

## 2023-12-28 LAB — FIBRINOGEN: Fibrinogen: >800 mg/dL — ABNORMAL HIGH (ref 210–475)

## 2023-12-28 MED ORDER — ORAL CARE MOUTH RINSE: 15.0000 mL | OROMUCOSAL | Status: DC | PRN

## 2023-12-28 MED ORDER — ORAL CARE MOUTH RINSE
15.0000 mL | OROMUCOSAL | Status: DC
  Administered 2023-12-28 – 2023-12-31 (×42): 15 mL via OROMUCOSAL

## 2023-12-28 MED ORDER — MIDAZOLAM HCL 2 MG/2ML IJ SOLN
INTRAMUSCULAR | Status: AC
  Filled 2023-12-28: qty 2

## 2023-12-28 MED ORDER — ALBUMIN HUMAN 25 % IV SOLN
12.5000 g | Freq: Every day | INTRAVENOUS | Status: DC
  Administered 2023-12-28 – 2023-12-30 (×3): 12.5 g via INTRAVENOUS
  Filled 2023-12-28 (×3): qty 50

## 2023-12-28 MED ORDER — HEPARIN SODIUM (PORCINE) 5000 UNIT/ML IJ SOLN
5000.0000 [IU] | Freq: Three times a day (TID) | INTRAMUSCULAR | Status: DC
  Administered 2023-12-28 – 2024-01-02 (×16): 5000 [IU] via SUBCUTANEOUS
  Filled 2023-12-28 (×16): qty 1

## 2023-12-28 MED ORDER — MIDAZOLAM HCL 2 MG/2ML IJ SOLN: 2.0000 mg | Freq: Once | INTRAMUSCULAR | Status: DC

## 2023-12-28 MED ORDER — SODIUM CHLORIDE 0.9 % IV SOLN
2.0000 g | Freq: Three times a day (TID) | INTRAVENOUS | Status: DC
  Administered 2023-12-28 – 2023-12-29 (×4): 2 g via INTRAVENOUS
  Filled 2023-12-28 (×5): qty 2000

## 2023-12-28 MED ORDER — RENA-VITE PO TABS
1.0000 | ORAL_TABLET | Freq: Every day | ORAL | Status: DC
  Administered 2023-12-28: 1 via ORAL
  Filled 2023-12-28: qty 1

## 2023-12-28 MED ORDER — CHLORHEXIDINE GLUCONATE CLOTH 2 % EX PADS
6.0000 | MEDICATED_PAD | Freq: Every day | CUTANEOUS | Status: DC
  Administered 2023-12-28 – 2024-01-03 (×7): 6 via TOPICAL

## 2023-12-28 MED ORDER — NOREPINEPHRINE 16 MG/250ML-% IV SOLN
0.0000 ug/min | INTRAVENOUS | Status: DC
  Administered 2023-12-28: 8 ug/min via INTRAVENOUS
  Administered 2023-12-31: 1 ug/min via INTRAVENOUS
  Filled 2023-12-28 (×2): qty 250

## 2023-12-28 MED ORDER — FAMOTIDINE 20 MG PO TABS
20.0000 mg | ORAL_TABLET | Freq: Every day | ORAL | Status: DC
  Administered 2023-12-29 – 2023-12-31 (×3): 20 mg
  Filled 2023-12-28 (×3): qty 1

## 2023-12-28 NOTE — Progress Notes (Signed)
 Initial Nutrition Assessment  DOCUMENTATION CODES:   Not applicable  INTERVENTION:  Renal Multivitamin w/ minerals daily Once medically appropriate, recommend starting enteral nutrition via OG-tube: Start Vital 1.5 at 20 mL/hr and advance by 10 mL q8h to a goal rate of 60 mL/hr (1440 mL per day) 60 mL ProSource TF20 - QID Tube feed regimen at goal provides 2480 kcal, 177 gm protein, and 110 mL free water  daily.   NUTRITION DIAGNOSIS:   Increased nutrient needs related to acute illness (CRRT, COPD exacerbation) as evidenced by estimated needs.  GOAL:   Patient will meet greater than or equal to 90% of their needs  MONITOR:   Vent status, Labs, Weight trends, I & O's  REASON FOR ASSESSMENT:   Consult, Ventilator Assessment of nutrition requirement/status  ASSESSMENT:   89 y.o. male presented to [facility] ED with AMS and acute respiratory failure. PMH includes HLD, HTN, hypothyroidism, COPD, glaucoma, BPH, MGUS, and recent humerus fracture s/p mechanical fall. Pt admitted with acute hypoxic respiratory failure, septic shock, PNA, AKI, acute metabolic encephalopathy, and acute on chronic hyponatremia.   12/31 - Admitted; Intubated; CRRT started  RD working remotely at time of assessment. Patient is currently intubated on ventilator support. CRRT started overnight for AKI. Per EMR, pt with a steady weight loss over the past year. Current weight up 14.8 kg from most recent weight in chart, likely due to acute illness. Pt noted with deep pitting edema per RN assessment.  MV: 10.9 L/min MAP (a-line): 96 Temp (24hrs), Avg:97.9 F (36.6 C), Min:96.4 F (35.8 C), Max:98.3 F (36.8 C)  Drips Fentanyl  Heparin  Levophed  Propofol : 25.9 ml/hr (684 kcal/day)  Medications reviewed and include: Colace, Pepcid , Solu-medrol , Miralax  Labs reviewed: Sodium 127, Potassium 4.6, BUN 71, Creatinine 3.62, Phosphorus 3.8, Magnesium  2.0 CBG: 81-129 x 24 hrs  UOP: 510 mL x 24 hrs OG output: 100 mL  x 24 hrs  CRRT: 520 mL x 24 hr  NUTRITION - FOCUSED PHYSICAL EXAM:  Deferred due to RD working remotely at time of assessment.  Diet Order:   Diet Order             Diet NPO time specified  Diet effective now                   EDUCATION NEEDS:   Not appropriate for education at this time  Skin:  Skin Assessment: Reviewed RN Assessment  Last BM:  12/27/23  Height:  Ht Readings from Last 1 Encounters:  12/27/23 6' 1 (1.854 m)    Weight:  Wt Readings from Last 1 Encounters:  12/28/23 108 kg    Ideal Body Weight:  83.6 kg  BMI:  Body mass index is 31.41 kg/m.  Estimated Nutritional Needs:  Kcal:  2300-2500 Protein:  165-185 gm Fluid:  > 2 L   Nestora Glatter RD, LDN Clinical Dietitian See [facility] for contact information.

## 2023-12-28 NOTE — Procedures (Signed)
 CENTRAL VENOUS CATHETER INSERTION PROCEDURE NOTE  Daniel Carr  [id number]  01-04-1935  Date:12/27/23 Time:11:05  Provider Performing:Daniel Carr   Procedure: Insertion of Non-tunneled Central Venous Catheter(36556) with US  guidance (23062)   Indication(s) Hemodialysis  Consent Risks of the procedure as well as the alternatives and risks of each were explained to the patient and/or caregiver.  Consent for the procedure was obtained and is signed in the bedside chart  Anesthesia Topical only with 1% lidocaine    Timeout Verified patient identification, verified procedure, site/side was marked, verified correct patient position, special equipment/implants available, medications/allergies/relevant history reviewed, required imaging and test results available.  Sterile Technique Maximal sterile technique including full sterile barrier drape, hand hygiene, sterile gown, sterile gloves, mask, hair covering, sterile ultrasound probe cover (if used).  Procedure Description Area of catheter insertion was cleaned with chlorhexidine  and draped in sterile fashion.  With real-time ultrasound guidance a central venous catheter was placed into the left internal jugular vein. Nonpulsatile blood flow and easy flushing noted in all ports.  The catheter was sutured in place and sterile dressing applied.  Complications/Tolerance None; patient tolerated the procedure well. Chest X-ray is ordered to verify placement for internal jugular or subclavian cannulation.   Chest x-ray is not ordered for femoral cannulation.  EBL Minimal  Specimen(s) None  Daniel Kathrene, DNP, CCRN, FNP-C, AGACNP-BC Acute Care & Family Nurse Practitioner  [facility]  See Amion for personal pager PCCM on call pager [phone] until 7 am

## 2023-12-28 NOTE — Consult Note (Signed)
 PHARMACY - ANTICOAGULATION CONSULT NOTE  Pharmacy Consult for IV Heparin  Indication:  CTEPH / Hx PE on apixaban  prior to admission  Patient Measurements: Height: 6' 1 (185.4 cm) Weight: 108 kg (238 lb 1.6 oz) IBW/kg (Calculated) : 79.9 Heparin  Dosing Weight: 102.3 kg  Labs: Recent Labs    12/27/23 1202 12/27/23 1650 12/27/23 1956 12/28/23 0427 12/28/23 0803  HGB 11.2*  --   --  9.2*  --   HCT 36.3*  --   --  26.5*  --   PLT 151  --   --  152  --   APTT 48*  --   --   --  128*  LABPROT 25.0*  --   --   --   --   INR 2.2*  --   --   --   --   HEPARINUNFRC  --  >1.10*  --   --  >1.10*  CREATININE 4.46*  --  4.44* 3.62*  --   TROPONINIHS 550* 529*  --   --   --     Estimated Creatinine Clearance: 18.9 mL/min (A) (by C-G formula based on SCr of 3.62 mg/dL (H)).   Medical History: Past Medical History:  Diagnosis Date   Arthritis    childhood infection    chicken pox, measels, and mumps   Glaucoma    Hypertension    Hypothyroidism    Sinus problem    Ulnar neuropathy    bilateral    Medications:  Apixaban  5 mg BID prior to admission. Last dose was 12/27/23 at 0900  Assessment: 89 y/o M with medical history as above and including Hx PE / CTEPH on apixaban  who is admitted with acute on chronic respiratory failure and renal failure. Apixaban  on hold and pharmacy consulted to initiate and manage heparin  in the meantime.  Baseline aPTT 48s (elevated), INR 2.2 (elevated), heparin  level pending. CBC notable for anemia with Hgb 11.2 but which appears at or better than baseline.  Goal of Therapy:  Heparin  level 0.3-0.7 units/ml aPTT 66 - 102 seconds Monitor platelets by anticoagulation protocol: Yes  Date/Time: aPTT/HL: Rate/Comment: 01/01@0803  128/>1.10 SUPRAtherapeutic@1200  units/hr   Plan:  --Will decrease heparin  infusion to 1000 units/hr --Check aPTT in 8 hours after rate change --Follow aPTT until correlation established with HL and then HL monitoring  only --Daily CBC per protocol while on IV heparin   89 y/o M 12/28/2023,8:45 AM

## 2023-12-28 NOTE — Plan of Care (Signed)
  Problem: Pain Management: Goal: General experience of comfort will improve Outcome: Progressing   Problem: Safety: Goal: Ability to remain free from injury will improve Outcome: Progressing   Problem: Skin Integrity: Goal: Risk for impaired skin integrity will decrease Outcome: Progressing

## 2023-12-28 NOTE — Progress Notes (Signed)
*  PRELIMINARY RESULTS* Echocardiogram 2D Echocardiogram has been performed.  Daniel Carr 12/28/2023, 2:17 PM

## 2023-12-28 NOTE — Progress Notes (Signed)
 Patients daughter states patient came into the ED with glasses and hat. This RN called ED to ask about the hat and glasses but no patient belongings were found by ED.

## 2023-12-28 NOTE — Progress Notes (Signed)
 PHARMACY - PHYSICIAN COMMUNICATION CRITICAL VALUE ALERT - BLOOD CULTURE IDENTIFICATION (BCID)  Daniel Carr is an 89 y.o. male who presented to [facility] on 12/27/2023 with a chief complaint of respiratory failure, sepsis, possible aspiration PNA.   Assessment:  E Faecalis in 2 of 3 bottles, no resistance detected.  (include suspected source if known)  Name of physician (or Provider) Contacted: Almarie Nose, NP   Current antibiotics: Zosyn  2.25 gm IV Q8H   Changes to prescribed antibiotics recommended:  Yes, will d/c zosyn  and start Ampicillin  2 gm IV Q8H.   Results for orders placed or performed during the hospital encounter of 12/27/23  Blood Culture ID Panel (Reflexed) (Collected: 12/27/2023 12:02 PM)  Result Value Ref Range   Enterococcus faecalis DETECTED (A) NOT DETECTED   Enterococcus Faecium NOT DETECTED NOT DETECTED   Listeria monocytogenes NOT DETECTED NOT DETECTED   Staphylococcus species NOT DETECTED NOT DETECTED   Staphylococcus aureus (BCID) NOT DETECTED NOT DETECTED   Staphylococcus epidermidis NOT DETECTED NOT DETECTED   Staphylococcus lugdunensis NOT DETECTED NOT DETECTED   Streptococcus species NOT DETECTED NOT DETECTED   Streptococcus agalactiae NOT DETECTED NOT DETECTED   Streptococcus pneumoniae NOT DETECTED NOT DETECTED   Streptococcus pyogenes NOT DETECTED NOT DETECTED   A.calcoaceticus-baumannii NOT DETECTED NOT DETECTED   Bacteroides fragilis NOT DETECTED NOT DETECTED   Enterobacterales NOT DETECTED NOT DETECTED   Enterobacter cloacae complex NOT DETECTED NOT DETECTED   Escherichia coli NOT DETECTED NOT DETECTED   Klebsiella aerogenes NOT DETECTED NOT DETECTED   Klebsiella oxytoca NOT DETECTED NOT DETECTED   Klebsiella pneumoniae NOT DETECTED NOT DETECTED   Proteus species NOT DETECTED NOT DETECTED   Salmonella species NOT DETECTED NOT DETECTED   Serratia marcescens NOT DETECTED NOT DETECTED   Haemophilus influenzae NOT DETECTED NOT DETECTED    Neisseria meningitidis NOT DETECTED NOT DETECTED   Pseudomonas aeruginosa NOT DETECTED NOT DETECTED   Stenotrophomonas maltophilia NOT DETECTED NOT DETECTED   Candida albicans NOT DETECTED NOT DETECTED   Candida auris NOT DETECTED NOT DETECTED   Candida glabrata NOT DETECTED NOT DETECTED   Candida krusei NOT DETECTED NOT DETECTED   Candida parapsilosis NOT DETECTED NOT DETECTED   Candida tropicalis NOT DETECTED NOT DETECTED   Cryptococcus neoformans/gattii NOT DETECTED NOT DETECTED   Vancomycin  resistance NOT DETECTED NOT DETECTED    Lisaanne Lawrie D 12/28/2023  3:09 AM

## 2023-12-28 NOTE — Progress Notes (Signed)
 NAME:  Daniel Carr, MRN:  [id number], DOB:  1935-08-31, LOS: 1 ADMISSION DATE:  12/27/2023, CONSULTATION DATE: 12/27/2023 REFERRING MD: Dr. Dicky, CHIEF COMPLAINT: Shortness of Breath    History of Present Illness:  This is an 89 yo male who presented to [facility] ER via EMS on 12/31 from [facility] with altered mental status and acute respiratory failure.  Pts family reports he has had worsening bilateral lower extremity edema and decreased urinary output.  His PCP increased his outpatient lasix  dose to 40 mg daily, however his urinary output did not improve.  Pts granddaughter reports he also c/o chills and fevers.  He was recently hospitalized at [facility] on 12/02/23 with a right humerus fracture from a mechanical fall on 12/3 and evaluated by ortho who recommended a sling for comfort and outpatient follow-up.  However, during presentation he was found to have progressive dyspnea; severe pulmonary hypertension; hyponatremia; acute blood loss anemia suspected secondary to right chest wall hematoma and acute kidney injury on chronic kidney disease requiring hospital admission.  Following treatment he was discharged on 12/17 to [facility] for rehab and instructed not to resume his outpatient apixaban  until 12/18.  His daughter reports the pts apixaban  has been resumed as instructed.    ED Course  Upon arrival to the ER pt in severe respiratory distress, hypotensive, and minimally responsive requiring mechanical intubation.  Significant lab results were: Na+ 125/K+ 5.9/chloride 86/BUN 72/creatinine 4.46/calcium 8.7/albumin  2.5/AST 143/ALT 89/troponin 550/lactic acid 2.4/wbc 15.9/hgb 11.2/PT 25.0/INR 2.2/APTT 48.  CXR concerning for possible small right pleural effusion.  He met sepsis criteria and received: 2L saline bolus; vancomycin , and aztreonam .  Despite iv fluid resuscitation pt remained hypotensive requiring levophed  gtt.  PCCM team contacted for ICU admission.   12/27/22- patient with improved  mentation post CRRT.  Reducing his sedatives on MV today.  Sepsis bacteremia, ID on case.  He is oozing blood from IV sites, will order DIC panel and pathology slide review.    Pertinent  Medical History  Arthritis  Glaucoma HTN  Hypothyroidism  HLD Hyponatremia  HFpEF  Right Humerus Fracture secondary to mechanical fall (11/29/2023) Anemia of Chronic Disease  COPD Aortic Stenosis   MGUS PE in 2022 previously on apixaban  but pt self discontinued restarted on 12/14/23 due to CTEPH   Significant Hospital Events: Including procedures, antibiotic start and stop dates in addition to other pertinent events   12/31: Pt admitted with acute hypercapnic respiratory failure, septic shock, AECOPD, pneumonia suspect aspiration, acute kidney injury, acute on chronic hyponatremia, transaminitis, and acute metabolic encephalopathy mechanical intubation    Interim History / Subjective:  Pt sedated with fentanyl  gtt at 25 mcg/hr and mechanically intubated requiring levophed  gtt @4  mcg/min to maintain map 65 or higher.    Objective   Blood pressure 120/70, pulse 81, temperature 97.9 F (36.6 C), temperature source Axillary, resp. rate (!) 22, height 6' 1 (1.854 m), weight 108 kg, SpO2 100%.    Vent Mode: PRVC FiO2 (%):  [30 %-40 %] 30 % Set Rate:  [16 bmp-26 bmp] 22 bmp Vt Set:  [500 mL] 500 mL PEEP:  [5 cmH20] 5 cmH20 Plateau Pressure:  [21 cmH20-24 cmH20] 21 cmH20   Intake/Output Summary (Last 24 hours) at 12/28/2023 0928 Last data filed at 12/28/2023 0900 Gross per 24 hour  Intake 1309.68 ml  Output 1295 ml  Net 14.68 ml   Filed Weights   12/27/23 1209 12/27/23 1444 12/28/23 0231  Weight: 123.7 kg 107.8 kg 108 kg  Examination: General: Acute on chronically-ill appearing male, NAD mechanically intubated  HENT: Supple, no JVD present  Lungs: Diminished throughout, even, non labored  Cardiovascular: NSR, s1s2, no r/g, 2+ radial/1+ distal pulses, 3+ bilateral lower extremity pitting edema   Abdomen: +BS x4, obese, soft, non distended  Extremities: Right arm sling present secondary to recent right humerus fracture with 2+ edema  Skin: Intact no rashes/lesions/pressure ulcers present  Neuro: Unresponsive RASS -5, not following commands, bilateral pupils pinpoint, no corneal/gag reflexes present  GU: Outpatient foley exchanged; new indwelling foley draining tea colored urine   Resolved Hospital Problem list     Assessment & Plan:   Acute toxic metabolic encephalopathy  - Avoid sedating medications as able  - Maintain RASS goal 0 to -1 - PAD protocol to maintain RASS goal: propofol  gtt and prn fentanyl   - Ammonia level pending  - WUA daily - If mentation does not improve in the next 72 hrs will order MRI Brain   2. SEPTIC SHOCK- PRESENT ON ADMISSION -Due E fecaelis bacteremia   -ID consult - currently on ampicillin ,   3. Acute on chronic HFpEF  #CTEPH   Hx: HTN, aortic stenosis, PE, and HLD  - Continuous telemetry monitoring  - Trend troponin until peaked  - BNP pending  - Prn levophed  gtt to maintain map 65 or higher  - Heparin  gtt: dosing per pharmacy  - Cooxy pending  - US  Venous right upper extremity and bilateral lower extremities to rule out DVT  - Will consider heart failure team consultation   #Acute hypercapnic respiratory failure  #AECOPD  #Mechanical intubation  - Full vent support for now: vent settings reviewed and established  - Continue lung protective strategies  - Maintain plateau pressures less than 30 cm H2O  - VAP bundle implemented - SBT once all parameters met  - Intermittent CXR's and ABG's - Scheduled and prn bronchodilator therapy  - IV steroids   #Acute kidney injury suspect secondary to ATN and diuretic therapy  #Hyperkalemia  #Acute on chronic hyponatremia  #Lactic acidosis  - Trend BMP and lactic acid  - Replace electrolytes as indicated  - Strict intake and output  - Urine osmolality/Na+ and serum osmolality pending  -  Shifting measures administered for hyperkalemia with repeat BMP pending @2000   #Pneumonia suspect aspiration  - Trend WBC and monitor fever curve - Trend PCT  - Follow cultures  - Continue zosyn  pending culture results and sensitivities   #Transaminitis  - Trend hepatic function panel  - Avoid hepatotoxic medications   #Anemia of chronic disease  - Trend CBC - Monitor for s/sx for bleeding  - Transfuse for hgb <7  #Previous right humerus fracture  - Venous US  to r/o VTE  - Continue sling per outpatient ortho recommendations  - Follow-up with outpatient orthopedic  #Hypothyroidism  - TSH and free T4 pending   #Endo - CBG's q4hrs  - Follow hypo/hyperglycemic protocol  - Target range 140 to 180  Best Practice (right click and Reselect all SmartList Selections daily)   Diet/type: NPO DVT prophylaxis systemic heparin  Pressure ulcer(s): N/A GI prophylaxis: H2B Lines: N/A Foley:  Yes, and it is still needed: outpatient foley exchanged 12/27/23 Code Status:  full code Last date of multidisciplinary goals of care discussion [N/A]  12/31: Updated pts family regarding pts condition and current plan of care  Labs   CBC: Recent Labs  Lab 12/27/23 1202 12/28/23 0427  WBC 15.9* 13.0*  NEUTROABS 12.3*  --  HGB 11.2* 9.2*  HCT 36.3* 26.5*  MCV 101.4* 91.1  PLT 151 152    Basic Metabolic Panel: Recent Labs  Lab 12/27/23 1202 12/27/23 1650 12/27/23 1956 12/28/23 0427  NA 125*  --  126* 127*  K 5.9*  --  5.5* 4.6  CL 86*  --  87* 91*  CO2 26  --  22 23  GLUCOSE 99  --  115* 124*  BUN 72*  --  75* 71*  CREATININE 4.46*  --  4.44* 3.62*  CALCIUM 8.7*  --  8.4* 8.1*  MG  --  2.2  --  2.0  PHOS  --  6.3*  --  3.8   GFR: Estimated Creatinine Clearance: 18.9 mL/min (A) (by C-G formula based on SCr of 3.62 mg/dL (H)). Recent Labs  Lab 12/27/23 1202 12/27/23 1650 12/27/23 1651 12/27/23 1956 12/28/23 0021 12/28/23 0427  PROCALCITON  --  19.58  --   --   --   12.67  WBC 15.9*  --   --   --   --  13.0*  LATICACIDVEN 2.4*  --  4.0* 3.6* 2.1*  --     Liver Function Tests: Recent Labs  Lab 12/27/23 1202 12/28/23 0427  AST 143* 72*  ALT 89* 59*  ALKPHOS 80 63  BILITOT 0.9 0.9  PROT 6.8 5.4*  ALBUMIN  2.5* 2.1*  2.0*   Recent Labs  Lab 12/27/23 1202  LIPASE 25   Recent Labs  Lab 12/27/23 1650 12/28/23 0427  AMMONIA 99* 31    ABG    Component Value Date/Time   PHART 7.51 (H) 12/28/2023 0022   PCO2ART 32 12/28/2023 0022   PO2ART 164 (H) 12/28/2023 0022   HCO3 25.5 12/28/2023 0022   ACIDBASEDEF 3.3 (H) 12/27/2023 1659   O2SAT 99.8 12/28/2023 0022     Coagulation Profile: Recent Labs  Lab 12/27/23 1202  INR 2.2*    Cardiac Enzymes: No results for input(s): CKTOTAL, CKMB, CKMBINDEX, TROPONINI in the last 168 hours.  HbA1C: No results found for: HGBA1C  CBG: Recent Labs  Lab 12/27/23 1442 12/27/23 1938 12/27/23 2331 12/28/23 0441 12/28/23 0736  GLUCAP 81 107* 129* 125* 122*    Review of Systems:   Unable to assess pt mechanically intubated   Past Medical History:  He,  has a past medical history of Arthritis, childhood infection, Glaucoma, Hypertension, Hypothyroidism, Sinus problem, and Ulnar neuropathy.   Surgical History:   Past Surgical History:  Procedure Laterality Date   APPENDECTOMY     CATARACT EXTRACTION Bilateral    COLONOSCOPY     JOINT REPLACEMENT Right    KNEE ARTHROPLASTY Left 12/24/2020   Procedure: COMPUTER ASSISTED TOTAL KNEE ARTHROPLASTY;  Surgeon: Mardee Lynwood SQUIBB, MD;  Location: [facility] ORS;  Service: Orthopedics;  Laterality: Left;  3RD CASE   KNEE SURGERY Right 10/04/2011   replacement   NM MYOVIEW  ([facility] HX)  07/21/2017   [place]. No ischemia.    RETINAL DETACHMENT REPAIR W/ SCLERAL BUCKLE LE     TEE WITHOUT CARDIOVERSION N/A 04/29/2023   Procedure: TRANSESOPHAGEAL ECHOCARDIOGRAM (TEE);  Surgeon: Hilarie Rocher, MD;  Location: [facility] ORS;  Service: Cardiovascular;  Laterality:  N/A;   TONSILLECTOMY       Social History:   reports that he quit smoking about 54 years ago. His smoking use included cigarettes. He started smoking about 60 years ago. He has a 1.5 pack-year smoking history. He has never used smokeless tobacco. He reports that he does not drink alcohol  and  does not use drugs.   Family History:  His family history includes Hypertension in his father and mother; Stroke in his father and sister.   Allergies No Known Allergies   Home Medications  Prior to Admission medications   Medication Sig Start Date End Date Taking? Authorizing Provider  albuterol  (VENTOLIN  HFA) 108 (90 Base) MCG/ACT inhaler SMARTSIG:2 Puff(s) By Mouth As Directed    [provider]  aspirin  81 MG EC tablet Take 1 tablet (81 mg total) by mouth daily. Swallow whole. 07/06/21   Gasper Nancyann BRAVO, MD  azelastine  (ASTELIN ) 0.1 % nasal spray Place 1 spray into the nose daily as needed (allergies). 11/09/19   [provider]  brimonidine  (ALPHAGAN ) 0.2 % ophthalmic solution Place 1 drop into both eyes in the morning and at bedtime. 03/26/15   [provider]  clotrimazole -betamethasone  (LOTRISONE ) cream APPLY TOPICALLY ONCE DAILY Patient not taking: Reported on 03/21/2023 04/05/22   Gasper Nancyann BRAVO, MD  dorzolamide  (TRUSOPT ) 2 % ophthalmic solution Apply to eye. 11/08/21   [provider]  DULoxetine  (CYMBALTA ) 30 MG capsule Take 30 mg by mouth daily. 09/13/23 12/11/23  [provider]  dutasteride  (AVODART ) 0.5 MG capsule Take 1 capsule by mouth once daily 12/23/22   Gasper Nancyann BRAVO, MD  furosemide  (LASIX ) 20 MG tablet Take 20 mg by mouth daily.    [provider]  hydroxychloroquine (PLAQUENIL) 200 MG tablet Take 200 mg by mouth daily. 09/07/23   [provider]  ipratropium (ATROVENT ) 0.06 % nasal spray Place 1 spray into both nostrils 2 (two) times daily as needed for rhinitis. 03/15/18   [provider]  latanoprost   (XALATAN ) 0.005 % ophthalmic solution SMARTSIG:In Eye(s) 03/25/22   [provider]  levocetirizine (XYZAL ) 5 MG tablet Take 5 mg by mouth every evening.    [provider]  levothyroxine  (SYNTHROID ) 150 MCG tablet Take 1 tablet by mouth once daily 12/23/22   [name], [name] E, MD  losartan (COZAAR) 25 MG tablet Take 25 mg by mouth daily.    [provider]  lovastatin  (MEVACOR ) 40 MG tablet Take 1 tablet by mouth once daily 05/09/23   [name], [name] E, MD  Multiple Vitamin (DAILY VITAMIN PO) Take 1 tablet by mouth daily.    [provider]  pregabalin  (LYRICA ) 50 MG capsule Take 1 capsule (50 mg total) by mouth 2 (two) times daily. 09/28/23   Gasper Nancyann BRAVO, MD  vitamin B-12 (CYANOCOBALAMIN) 100 MCG tablet Take 100 mcg by mouth daily. Unsure of dose  is OTC med Patient not taking: Reported on 09/22/2023    [provider]     Critical care provider statement:   Total critical care time: 33 minutes   Performed by: Parris MD   Critical care time was exclusive of separately billable procedures and treating other patients.   Critical care was necessary to treat or prevent imminent or life-threatening deterioration.   Critical care was time spent personally by me on the following activities: development of treatment plan with patient and/or surrogate as well as nursing, discussions with consultants, evaluation of patient's response to treatment, examination of patient, obtaining history from patient or surrogate, ordering and performing treatments and interventions, ordering and review of laboratory studies, ordering and review of radiographic studies, pulse oximetry and re-evaluation of patient's condition.    Tracie Dore, M.D.  Pulmonary & Critical Care Medicine

## 2023-12-28 NOTE — Progress Notes (Signed)
 ID Chart reviewed  89 yr male with h/o COPD, left hemidiaphragm paralysis, aortic stenosis , HTN, Hypothyroidism, h/o PE off ac,  Recent hpsitalization at [facility] 12/6-12/17 with progressive dyspnea and found to have Severe Pulmonary HTN, chronic co2 retention, CTEPH on eliquis , fracture rt humerus from mechanical fall on 12/3 ( managed non surgical), chronic hyponatremia, urinary retention foley , started remeron  for appetite and mood and discharged to [facility]  Presented to [facility] ED on 12/27/23 from [facility] with altered mental status and not passing urine for 3 days Vitals in the ED 110/74, HR 106, Temp 96.4, RR 16 NA was 125, cr 4.46 Wbc 15.9, Hb 11.2 VBG- ph 7.17, Pco2 75 Intubated On pressor   Acute encephalopathy Acute hypoxic resp failure- intubated Septic shock Enterococcus fecalis bacteremia 1 of 2 sets  AKI-Started CRRT Transaminitis Urinary retention with indwelling foley Aortic stenosis    Continue ampicillin  Iv adjusted for Crcl Need 2 d echo' will repeat blood cultures 48 hrs after appropriate antibiotic

## 2023-12-28 NOTE — Procedures (Signed)
 Arterial Catheter Insertion Procedure Note  Alann Avey  [id number]  [birth year]  Date:12/27/23 Time:23:30   Provider Performing: Almarie DELENA Nose    Procedure: Insertion of Arterial Line (63379) with US  guidance (23062)   Indication(s) Blood pressure monitoring and/or need for frequent ABGs  Consent Risks of the procedure as well as the alternatives and risks of each were explained to the patient and/or caregiver.  Consent for the procedure was obtained and is signed in the bedside chart  Anesthesia None  Time Out Verified patient identification, verified procedure, site/side was marked, verified correct patient position, special equipment/implants available, medications/allergies/relevant history reviewed, required imaging and test results available.  Sterile Technique Maximal sterile technique including full sterile barrier drape, hand hygiene, sterile gown, sterile gloves, mask, hair covering, sterile ultrasound probe cover (if used).  Procedure Description Area of catheter insertion was cleaned with chlorhexidine  and draped in sterile fashion. With real-time ultrasound guidance an arterial catheter was placed into the left femoral artery.  Appropriate arterial tracings confirmed on monitor.    Complications/Tolerance None; patient tolerated the procedure well.  EBL Minimal  Specimen(s) None   Almarie Nose, DNP, CCRN, FNP-C, AGACNP-BC Acute Care & Family Nurse Practitioner  [facility]  See Amion for personal pager PCCM on call pager [phone] until 7 am

## 2023-12-28 NOTE — Progress Notes (Signed)
 Patient admitted to ICU 17 from ED. Patient currently intubated and sedated. Patient belongings include dentures only which were returned to patients daughter.

## 2023-12-28 NOTE — Consult Note (Signed)
 PHARMACY CONSULT NOTE - ELECTROLYTES  Pharmacy Consult for Electrolyte Monitoring and Replacement   Recent Labs: Potassium (mmol/L)  Date Value  12/28/2023 4.6   Magnesium  (mg/dL)  Date Value  98/98/7974 2.0   Calcium (mg/dL)  Date Value  98/98/7974 8.1 (L)   Albumin  (g/dL)  Date Value  98/98/7974 2.1 (L)  12/28/2023 2.0 (L)  09/28/2023 4.0   Phosphorus (mg/dL)  Date Value  98/98/7974 3.8   Sodium (mmol/L)  Date Value  12/28/2023 127 (L)  09/28/2023 132 (L)   Height: 6' 1 (185.4 cm) Weight: 108 kg (238 lb 1.6 oz) IBW/kg (Calculated) : 79.9 Estimated Creatinine Clearance: 18.9 mL/min (A) (by C-G formula based on SCr of 3.62 mg/dL (H)).  Assessment  Daniel Carr is a 89 y.o. male presenting with acute on chronic respiratory failure. PMH significant for HTN, hypothyroidism, HFpEF, COPD, aortic stenosis, Hx PE / CTEPH. Pharmacy has been consulted to monitor and replace electrolytes.  Diet: NPO MIVF: N/A Pertinent medications: Insulin  / dextrose  and Lokelma  ordered for hyperK+  Goal of Therapy: Electrolytes within normal limits  Plan:  No replacement currently indicated Patient is now on CRRT. Twice daily renal function panel labs ordered Will recheck all electrolytes with AM labs  Thank you for allowing pharmacy to be a part of this patient's care.  Haydn Hutsell A Seward Coran 12/28/2023 7:22 AM

## 2023-12-28 NOTE — Plan of Care (Addendum)
 BMP repeated at 2000. CRRT started this shift. A line and Trialysis cath inserted c/o Elizabeth NP.  Problem: Clinical Measurements: Goal: Cardiovascular complication will be avoided Outcome: Progressing   Problem: Activity: Goal: Risk for activity intolerance will decrease Outcome: Progressing   Problem: Pain Management: Goal: General experience of comfort will improve Outcome: Progressing   Problem: Safety: Goal: Ability to remain free from injury will improve Outcome: Progressing

## 2023-12-28 NOTE — Consult Note (Signed)
 [facility]  CONSULT NOTE    Date: 12/28/2023                  Patient Name:  Daniel Carr  MRN: [id number]  DOB: March 31, 1935  Age / Sex: 89 y.o., male         PCP: Gasper Nancyann BRAVO, MD                 Service Requesting Consult: Dr. Parris                 Reason for Consult: Acute Kidney Injury            History of Present Illness: Daniel Carr admitted from [facility] with acute respiratory failure and altered mental status. Patient's daughter is at bedside. Patient recently was hospitalized at [facility] for fall and right humerus fracture. No surgery was done.   Nephrology consulted for acute kidney injury, hyperkalemia, hyponatremia. Patient with hypotension and started on norepinephrine .   Patient with oliguria and was decided to start patient on CVVHDF overnight.   Patient intubated, sedated and critically ill.    Medications: Outpatient medications: Medications Prior to Admission  Medication Sig Dispense Refill Last Dose/Taking   acetaminophen  (TYLENOL ) 500 MG tablet Take 1,000 mg by mouth every 8 (eight) hours.   12/27/2023 Morning   apixaban  (ELIQUIS ) 5 MG TABS tablet Take 5 mg by mouth 2 (two) times daily.   12/27/2023 at  9:00 AM   azelastine  (ASTELIN ) 0.1 % nasal spray Place 2 sprays into both nostrils 2 (two) times daily.   12/27/2023 Morning   brimonidine  (ALPHAGAN ) 0.2 % ophthalmic solution Place 1 drop into both eyes 3 (three) times daily.   12/27/2023 Morning   Cholecalciferol (VITAMIN D3) 10 MCG (400 UNIT) tablet Take 800 Units by mouth daily.   12/27/2023 at  9:00 AM   diclofenac  Sodium (VOLTAREN ) 1 % GEL Apply 2 g topically 4 (four) times daily.   12/27/2023 Morning   dorzolamide  (TRUSOPT ) 2 % ophthalmic solution Place 1 drop into the left eye 3 (three) times daily.   12/27/2023 Morning   doxycycline (ADOXA) 100 MG tablet Take 100 mg by mouth 2 (two) times daily. X 7 days   12/27/2023 Morning   dutasteride  (AVODART ) 0.5 MG  capsule Take 1 capsule by mouth once daily 90 capsule 4 12/27/2023 at  9:00 AM   ferrous sulfate  325 (65 FE) MG tablet Take 325 mg by mouth daily.   12/27/2023 Morning   furosemide  (LASIX ) 20 MG tablet Take 20 mg by mouth daily.   12/27/2023 Morning   hydroxychloroquine (PLAQUENIL) 200 MG tablet Take 200 mg by mouth daily.   12/26/2023 Morning   levocetirizine (XYZAL ) 5 MG tablet Take 5 mg by mouth every evening.   12/26/2023 Evening   levothyroxine  (SYNTHROID ) 150 MCG tablet Take 1 tablet by mouth once daily 90 tablet 4 12/27/2023 Morning   lidocaine  4 % Place 1 patch onto the skin daily.   12/27/2023 Morning   lovastatin  (MEVACOR ) 40 MG tablet Take 1 tablet by mouth once daily 90 tablet 3 12/25/2023   melatonin 3 MG TABS tablet Take 3 mg by mouth at bedtime.   12/26/2023 at  9:00 PM   mirtazapine  (REMERON ) 15 MG tablet Take 15 mg by mouth at bedtime.   12/26/2023 at  9:00 PM   Multiple Vitamin (DAILY VITAMIN PO) Take 1 tablet by mouth daily.   12/27/2023 Morning   oxyCODONE  (OXY IR/ROXICODONE ) 5 MG  immediate release tablet Take 2.5 mg by mouth every 6 (six) hours as needed for severe pain (pain score 7-10) or moderate pain (pain score 4-6).   12/26/2023 at  8:05 PM   polyethylene glycol (MIRALAX  / GLYCOLAX ) 17 g packet Take 17 g by mouth 2 (two) times daily.   12/27/2023 Morning   senna (SENOKOT) 8.6 MG tablet Take 2 tablets by mouth 2 (two) times daily.   12/27/2023 Morning   simethicone (MYLICON) 80 MG chewable tablet Chew 80 mg by mouth every 6 (six) hours as needed for flatulence.   Taking As Needed   sodium chloride  1 g tablet Take 1 g by mouth daily.   12/27/2023 Morning   umeclidinium-vilanterol (ANORO ELLIPTA) 62.5-25 MCG/ACT AEPB Inhale 1 puff into the lungs daily.   12/27/2023 Morning   albuterol  (VENTOLIN  HFA) 108 (90 Base) MCG/ACT inhaler SMARTSIG:2 Puff(s) By Mouth As Directed (Patient not taking: Reported on 12/27/2023)   Not Taking   aspirin  81 MG EC tablet Take 1 tablet (81 mg  total) by mouth daily. Swallow whole. (Patient not taking: Reported on 12/27/2023)   Not Taking   clotrimazole -betamethasone  (LOTRISONE ) cream APPLY TOPICALLY ONCE DAILY (Patient not taking: Reported on 03/21/2023) 15 g 0 Not Taking   DULoxetine  (CYMBALTA ) 30 MG capsule Take 30 mg by mouth daily.      ipratropium (ATROVENT ) 0.06 % nasal spray Place 1 spray into both nostrils 2 (two) times daily as needed for rhinitis. (Patient not taking: Reported on 12/27/2023)  6 Not Taking   latanoprost  (XALATAN ) 0.005 % ophthalmic solution SMARTSIG:In Eye(s) (Patient not taking: Reported on 12/27/2023)   Not Taking   losartan (COZAAR) 25 MG tablet Take 25 mg by mouth daily. (Patient not taking: Reported on 12/27/2023)   Not Taking   pregabalin  (LYRICA ) 50 MG capsule Take 1 capsule (50 mg total) by mouth 2 (two) times daily. (Patient not taking: Reported on 12/27/2023) 60 capsule 1 Not Taking   vitamin B-12 (CYANOCOBALAMIN) 100 MCG tablet Take 100 mcg by mouth daily. Unsure of dose  is OTC med (Patient not taking: Reported on 09/22/2023)   Not Taking    Current medications: Current Facility-Administered Medications  Medication Dose Route Frequency Provider Last Rate Last Admin   0.9 %  sodium chloride  infusion  250 mL Intravenous Continuous Dicky Anes, MD 20 mL/hr at 12/28/23 0800 Infusion Verify at 12/28/23 0800   ampicillin  (OMNIPEN) 2 g in sodium chloride  0.9 % 100 mL IVPB  2 g Intravenous Q8H Kathrene Almarie Bake, NP   Stopped at 12/28/23 9261   Chlorhexidine  Gluconate Cloth 2 % PADS 6 each  6 each Topical Daily Kathrene Almarie Bake, NP       insulin  aspart (novoLOG ) injection 10 Units  10 Units Intravenous Once Nelson, Dana G, NP       And   dextrose  50 % solution 50 mL  1 ampule Intravenous Once Nelson, Dana G, NP       docusate (COLACE) 50 MG/5ML liquid 100 mg  100 mg Per Tube BID PRN Nelson, Dana G, NP       docusate (COLACE) 50 MG/5ML liquid 100 mg  100 mg Per Tube BID Nelson, Dana G, NP   100 mg  at 12/27/23 2216   famotidine  (PEPCID ) tablet 20 mg  20 mg Per Tube BID Nelson, Dana G, NP   20 mg at 12/27/23 2216   fentaNYL  (SUBLIMAZE ) bolus via infusion 25-100 mcg  25-100 mcg Intravenous Q15 min PRN Kathrene Almarie Bake,  NP   50 mcg at 12/27/23 2154   fentaNYL  (SUBLIMAZE ) injection 25 mcg  25 mcg Intravenous Q15 min PRN Nelson, Dana G, NP       fentaNYL  (SUBLIMAZE ) injection 25 mcg  25 mcg Intravenous Once Kathrene Almarie Bake, NP       fentaNYL  (SUBLIMAZE ) injection 25-100 mcg  25-100 mcg Intravenous Q30 min PRN Nelson, Dana G, NP       fentaNYL  in NS 250mL (39mcg/ml) infusion-PREMIX  25-200 mcg/hr Intravenous Continuous Kathrene Almarie Bake, NP 12.5 mL/hr at 12/28/23 0800 125 mcg/hr at 12/28/23 0800   heparin  ADULT infusion 100 units/mL (25000 units/250mL)  1,200 Units/hr Intravenous Continuous Aleskerov, Fuad, MD 12 mL/hr at 12/28/23 0800 1,200 Units/hr at 12/28/23 0800   heparin  injection 1,000-6,000 Units  1,000-6,000 Units CRRT PRN Seville Downs, MD       ipratropium-albuterol  (DUONEB) 0.5-2.5 (3) MG/3ML nebulizer solution 3 mL  3 mL Nebulization Q6H Nelson, Dana G, NP   3 mL at 12/28/23 0802   ipratropium-albuterol  (DUONEB) 0.5-2.5 (3) MG/3ML nebulizer solution 3 mL  3 mL Nebulization Q6H PRN Nelson, Dana G, NP       methylPREDNISolone  sodium succinate (SOLU-MEDROL ) 40 mg/mL injection 40 mg  40 mg Intravenous Q12H Nelson, Dana G, NP   40 mg at 12/28/23 9666   norepinephrine  (LEVOPHED ) 16 mg in (0.064 mg/mL) premix infusion  0-40 mcg/min Intravenous Titrated Kathrene Almarie Bake, NP 5.63 mL/hr at 12/28/23 0801 6 mcg/min at 12/28/23 0801   Oral care mouth rinse  15 mL Mouth Rinse Q2H Kathrene Almarie Bake, NP   15 mL at 12/28/23 0802   Oral care mouth rinse  15 mL Mouth Rinse PRN Kathrene Almarie Bake, NP       polyethylene glycol (MIRALAX  / GLYCOLAX ) packet 17 g  17 g Per Tube Daily PRN Nelson, Dana G, NP       polyethylene glycol (MIRALAX  / GLYCOLAX )  packet 17 g  17 g Per Tube Daily Maranda Lonell MATSU, NP       PrismaSol  BGK 2/3.5 infusion   CRRT Continuous Hester Forget, MD 400 mL/hr at 12/27/23 2325 New Bag at 12/27/23 2325   PrismaSol  BGK 2/3.5 infusion   CRRT Continuous Jun Osment, MD 400 mL/hr at 12/27/23 2325 New Bag at 12/27/23 2325   PrismaSol  BGK 2/3.5 infusion   CRRT Continuous Dannie Hattabaugh, Woodward, MD 1,000 mL/hr at 12/28/23 0443 New Bag at 12/28/23 0443   propofol  (DIPRIVAN ) 1000 MG/100ML infusion  0-50 mcg/kg/min Intravenous Continuous Nelson, Dana G, NP 25.9 mL/hr at 12/28/23 0800 40 mcg/kg/min at 12/28/23 0800   sodium chloride  0.9 % primer fluid for CRRT   CRRT PRN Francille Wittmann, Woodward, MD       sodium zirconium cyclosilicate  (LOKELMA ) packet 10 g  10 g Per Tube Once Maranda Lonell MATSU, NP          Allergies: No Known Allergies    Past Medical History: Past Medical History:  Diagnosis Date   Arthritis    childhood infection    chicken pox, measels, and mumps   Glaucoma    Hypertension    Hypothyroidism    Sinus problem    Ulnar neuropathy    bilateral     Past Surgical History: Past Surgical History:  Procedure Laterality Date   APPENDECTOMY     CATARACT EXTRACTION Bilateral    COLONOSCOPY     JOINT REPLACEMENT Right    KNEE ARTHROPLASTY Left 12/24/2020   Procedure: COMPUTER ASSISTED TOTAL KNEE ARTHROPLASTY;  Surgeon: Mardee Lynwood SQUIBB, MD;  Location: [facility] ORS;  Service: Orthopedics;  Laterality: Left;  3RD CASE   KNEE SURGERY Right 10/04/2011   replacement   NM MYOVIEW  ([facility] HX)  07/21/2017   [place]. No ischemia.    RETINAL DETACHMENT REPAIR W/ SCLERAL BUCKLE LE     TEE WITHOUT CARDIOVERSION N/A 04/29/2023   Procedure: TRANSESOPHAGEAL ECHOCARDIOGRAM (TEE);  Surgeon: Hilarie Rocher, MD;  Location: [facility] ORS;  Service: Cardiovascular;  Laterality: N/A;   TONSILLECTOMY       Family History: Family History  Problem Relation Age of Onset   Stroke Sister    Hypertension Mother    Stroke Father    Hypertension Father       Social History: Social History   Socioeconomic History   Marital status: Married    Spouse name: Not on file   Number of children: 1   Years of education: college   Highest education level: Some college, no degree  Occupational History   Occupation: Retired  Tobacco Use   Smoking status: Former    Current packs/day: 0.00    Average packs/day: 0.3 packs/day for 6.0 years (1.5 ttl pk-yrs)    Types: Cigarettes    Start date: 11/14/1963    Quit date: 11/13/1969    Years since quitting: 54.1   Smokeless tobacco: Never   Tobacco comments:    quit in 1970's  Vaping Use   Vaping status: Never Used  Substance and Sexual Activity   Alcohol  use: No    Alcohol /week: 0.0 standard drinks of alcohol    Drug use: No   Sexual activity: Yes  Other Topics Concern   Not on file  Social History Narrative   Not on file   Social Drivers of Health   Financial Resource Strain: Low Risk  (11/22/2023)   Received from [facility]   Overall Financial Resource Strain (CARDIA)    Difficulty of Paying Living Expenses: Not hard at all  Food Insecurity: Patient Unable To Answer (12/28/2023)   Hunger Vital Sign    Worried About Running Out of Food in the Last Year: Patient unable to answer    Ran Out of Food in the Last Year: Patient unable to answer  Transportation Needs: Patient Unable To Answer (12/28/2023)   PRAPARE - Transportation    Lack of Transportation (Medical): Patient unable to answer    Lack of Transportation (Non-Medical): Patient unable to answer  Physical Activity: Inactive (09/25/2023)   Exercise Vital Sign    Days of Exercise per Week: 0 days    Minutes of Exercise per Session: 0 min  Stress: No Stress Concern Present (09/25/2023)   [employer] of Occupational Health - Occupational Stress Questionnaire    Feeling of Stress : Not at all  Social Connections: Unknown (12/28/2023)   Social Connection and Isolation Panel [NHANES]    Frequency of  Communication with Friends and Family: Patient unable to answer    Frequency of Social Gatherings with Friends and Family: Patient unable to answer    Attends Religious Services: Patient unable to answer    Active Member of Clubs or Organizations: No    Attends Banker Meetings: Never    Marital Status: Married  Catering Manager Violence: Patient Unable To Answer (12/28/2023)   Humiliation, Afraid, Rape, and Kick questionnaire    Fear of Current or Ex-Partner: Patient unable to answer    Emotionally Abused: Patient unable to answer    Physically Abused: Patient unable to answer  Sexually Abused: Patient unable to answer       Vital Signs: Blood pressure 109/63, pulse 82, temperature 97.9 F (36.6 C), temperature source Axillary, resp. rate (!) 22, height 6' 1 (1.854 m), weight 108 kg, SpO2 100%.  Weight trends: Filed Weights   12/27/23 1209 12/27/23 1444 12/28/23 0231  Weight: 123.7 kg 107.8 kg 108 kg    Physical Exam: General: Critically ill,   Head: +ETT, +OGT to suction  Eyes: Anicteric  Neck:  trachea midline  Lungs:  PRVC FiO2 30%  Heart: Regular rate and rhythm  Abdomen:  Soft, +bowel sounds  Extremities:  ++ peripheral edema.  Neurologic: sedated  Skin: No lesions  Access: Left internal jugular temp HD catheter 12/27/23     Lab results: Basic Metabolic Panel: Recent Labs  Lab 12/27/23 1202 12/27/23 1650 12/27/23 1956 12/28/23 0427  NA 125*  --  126* 127*  K 5.9*  --  5.5* 4.6  CL 86*  --  87* 91*  CO2 26  --  22 23  GLUCOSE 99  --  115* 124*  BUN 72*  --  75* 71*  CREATININE 4.46*  --  4.44* 3.62*  CALCIUM 8.7*  --  8.4* 8.1*  MG  --  2.2  --  2.0  PHOS  --  6.3*  --  3.8    Liver Function Tests: Recent Labs  Lab 12/27/23 1202 12/28/23 0427  AST 143* 72*  ALT 89* 59*  ALKPHOS 80 63  BILITOT 0.9 0.9  PROT 6.8 5.4*  ALBUMIN  2.5* 2.1*  2.0*   Recent Labs  Lab 12/27/23 1202  LIPASE 25   Recent Labs  Lab 12/27/23 1650  12/28/23 0427  AMMONIA 99* 31    CBC: Recent Labs  Lab 12/27/23 1202 12/28/23 0427  WBC 15.9* 13.0*  NEUTROABS 12.3*  --   HGB 11.2* 9.2*  HCT 36.3* 26.5*  MCV 101.4* 91.1  PLT 151 152    Cardiac Enzymes: No results for input(s): CKTOTAL, CKMB, CKMBINDEX, TROPONINI in the last 168 hours.  BNP: Invalid input(s): POCBNP  CBG: Recent Labs  Lab 12/27/23 1442 12/27/23 1938 12/27/23 2331 12/28/23 0441 12/28/23 0736  GLUCAP 81 107* 129* 125* 122*    Microbiology: Results for orders placed or performed during the hospital encounter of 12/27/23  Blood Culture (routine x 2)     Status: None (Preliminary result)   Collection Time: 12/27/23 12:02 PM   Specimen: BLOOD  Result Value Ref Range Status   Specimen Description BLOOD BLOOD RIGHT HAND  Final   Special Requests   Final    BOTTLES DRAWN AEROBIC AND ANAEROBIC Blood Culture adequate volume   Culture  Setup Time   Final    GRAM POSITIVE COCCI IN BOTH AEROBIC AND ANAEROBIC BOTTLES Organism ID to follow ENTEROCOCCUS FAECALIS CRITICAL RESULT CALLED TO, READ BACK BY AND VERIFIED WITHBETHA SELINDA SIMPERS @ 0254 12/28/23 [facility] Performed at [facility], [address] [place], [place] [address]    Culture [facility] POSITIVE COCCI  Final   Report Status PENDING  Incomplete  Blood Culture ID Panel (Reflexed)     Status: Abnormal   Collection Time: 12/27/23 12:02 PM  Result Value Ref Range Status   Enterococcus faecalis DETECTED (A) NOT DETECTED Final    Comment: CRITICAL RESULT CALLED TO, READ BACK BY AND VERIFIED WITH: JASON ROBBINS @ 0245 12/28/23 [facility]    Enterococcus Faecium NOT DETECTED NOT DETECTED Final   Listeria monocytogenes NOT DETECTED NOT DETECTED Final  Staphylococcus species NOT DETECTED NOT DETECTED Final   Staphylococcus aureus (BCID) NOT DETECTED NOT DETECTED Final   Staphylococcus epidermidis NOT DETECTED NOT DETECTED Final   Staphylococcus lugdunensis NOT DETECTED NOT DETECTED Final    Streptococcus species NOT DETECTED NOT DETECTED Final   Streptococcus agalactiae NOT DETECTED NOT DETECTED Final   Streptococcus pneumoniae NOT DETECTED NOT DETECTED Final   Streptococcus pyogenes NOT DETECTED NOT DETECTED Final   A.calcoaceticus-baumannii NOT DETECTED NOT DETECTED Final   Bacteroides fragilis NOT DETECTED NOT DETECTED Final   Enterobacterales NOT DETECTED NOT DETECTED Final   Enterobacter cloacae complex NOT DETECTED NOT DETECTED Final   Escherichia coli NOT DETECTED NOT DETECTED Final   Klebsiella aerogenes NOT DETECTED NOT DETECTED Final   Klebsiella oxytoca NOT DETECTED NOT DETECTED Final   Klebsiella pneumoniae NOT DETECTED NOT DETECTED Final   Proteus species NOT DETECTED NOT DETECTED Final   Salmonella species NOT DETECTED NOT DETECTED Final   Serratia marcescens NOT DETECTED NOT DETECTED Final   Haemophilus influenzae NOT DETECTED NOT DETECTED Final   Neisseria meningitidis NOT DETECTED NOT DETECTED Final   Pseudomonas aeruginosa NOT DETECTED NOT DETECTED Final   Stenotrophomonas maltophilia NOT DETECTED NOT DETECTED Final   Candida albicans NOT DETECTED NOT DETECTED Final   Candida auris NOT DETECTED NOT DETECTED Final   Candida glabrata NOT DETECTED NOT DETECTED Final   Candida krusei NOT DETECTED NOT DETECTED Final   Candida parapsilosis NOT DETECTED NOT DETECTED Final   Candida tropicalis NOT DETECTED NOT DETECTED Final   Cryptococcus neoformans/gattii NOT DETECTED NOT DETECTED Final   Vancomycin  resistance NOT DETECTED NOT DETECTED Final    Comment: Performed at [facility], [address] [place], [place] [address]  MRSA Next Gen by PCR, Nasal     Status: None   Collection Time: 12/27/23  4:44 PM   Specimen: Nasal Mucosa; Nasal Swab  Result Value Ref Range Status   MRSA by PCR Next Gen NOT DETECTED NOT DETECTED Final    Comment: (NOTE) The GeneXpert MRSA Assay (FDA approved for NASAL specimens only), is one component of a comprehensive  MRSA colonization surveillance program. It is not intended to diagnose MRSA infection nor to guide or monitor treatment for MRSA infections. Test performance is not FDA approved in patients less than 75 years old. Performed at [facility], [address] [place], [place] [address]   Resp panel by RT-PCR (RSV, Flu A&B, Covid) Anterior Nasal Swab     Status: None   Collection Time: 12/27/23  4:48 PM   Specimen: Anterior Nasal Swab  Result Value Ref Range Status   SARS Coronavirus 2 by RT PCR NEGATIVE NEGATIVE Final    Comment: (NOTE) SARS-CoV-2 target nucleic acids are NOT DETECTED.  The SARS-CoV-2 RNA is generally detectable in upper respiratory specimens during the acute phase of infection. The lowest concentration of SARS-CoV-2 viral copies this assay can detect is 138 copies/mL. A negative result does not preclude SARS-Cov-2 infection and should not be used as the sole basis for treatment or other patient management decisions. A negative result may occur with  improper specimen collection/handling, submission of specimen other than nasopharyngeal swab, presence of viral mutation(s) within the areas targeted by this assay, and inadequate number of viral copies(<138 copies/mL). A negative result must be combined with clinical observations, patient history, and epidemiological information. The expected result is Negative.  Fact Sheet for Patients:  bloggercourse.com  Fact Sheet for Healthcare Providers:  seriousbroker.it  This test is no t  yet approved or cleared by the [place]  FDA and  has been authorized for detection and/or diagnosis of SARS-CoV-2 by FDA under an Emergency Use Authorization (EUA). This EUA will remain  in effect (meaning this test can be used) for the duration of the COVID-19 declaration under Section 564(b)(1) of the Act, 21 U.S.C.section 360bbb-3(b)(1), unless the authorization is terminated   or revoked sooner.       Influenza A by PCR NEGATIVE NEGATIVE Final   Influenza B by PCR NEGATIVE NEGATIVE Final    Comment: (NOTE) The Xpert Xpress SARS-CoV-2/FLU/RSV plus assay is intended as an aid in the diagnosis of influenza from Nasopharyngeal swab specimens and should not be used as a sole basis for treatment. Nasal washings and aspirates are unacceptable for Xpert Xpress SARS-CoV-2/FLU/RSV testing.  Fact Sheet for Patients: bloggercourse.com  Fact Sheet for Healthcare Providers: seriousbroker.it  This test is not yet approved or cleared by the [place]  FDA and has been authorized for detection and/or diagnosis of SARS-CoV-2 by FDA under an Emergency Use Authorization (EUA). This EUA will remain in effect (meaning this test can be used) for the duration of the COVID-19 declaration under Section 564(b)(1) of the Act, 21 U.S.C. section 360bbb-3(b)(1), unless the authorization is terminated or revoked.     Resp Syncytial Virus by PCR NEGATIVE NEGATIVE Final    Comment: (NOTE) Fact Sheet for Patients: bloggercourse.com  Fact Sheet for Healthcare Providers: seriousbroker.it  This test is not yet approved or cleared by the [place]  FDA and has been authorized for detection and/or diagnosis of SARS-CoV-2 by FDA under an Emergency Use Authorization (EUA). This EUA will remain in effect (meaning this test can be used) for the duration of the COVID-19 declaration under Section 564(b)(1) of the Act, 21 U.S.C. section 360bbb-3(b)(1), unless the authorization is terminated or revoked.  Performed at [facility], [address] [place], [place] [address]   Blood Culture (routine x 2)     Status: None (Preliminary result)   Collection Time: 12/27/23  4:50 PM   Specimen: BLOOD  Result Value Ref Range Status   Specimen Description BLOOD BLOOD LEFT HAND   Final   Special Requests   Final    BOTTLES DRAWN AEROBIC ONLY Blood Culture results may not be optimal due to an inadequate volume of blood received in culture bottles   Culture   Final    NO GROWTH < 12 HOURS Performed at [facility], [address], [place], [place] [address]    Report Status PENDING  Incomplete    Coagulation Studies: Recent Labs    12/27/23 1202  LABPROT 25.0*  INR 2.2*    Urinalysis: Recent Labs    12/28/23 0024  COLORURINE AMBER*  LABSPEC 1.013  PHURINE 5.0  GLUCOSEU NEGATIVE  HGBUR LARGE*  BILIRUBINUR NEGATIVE  KETONESUR NEGATIVE  PROTEINUR 100*  NITRITE NEGATIVE  LEUKOCYTESUR NEGATIVE      Imaging: DG Chest Port 1 View Result Date: 12/27/2023 CLINICAL DATA:  Check central line placement EXAM: PORTABLE CHEST 1 VIEW COMPARISON:  Film from earlier in the same day. FINDINGS: The tube and gastric catheter are noted and stable. Temporary dialysis catheter via the left jugular approach is noted with the tip in proximal superior vena cava. No pneumothorax is noted. Skin fold is noted over the left apex. No focal infiltrate is seen. IMPRESSION: No pneumothorax following central line placement. Electronically Signed   By: Oneil Devonshire M.D.   On: 12/27/2023 23:08   US   Venous Img Lower Bilateral (DVT) Result Date: 12/27/2023 CLINICAL DATA:  Bilateral lower extremity edema. Recent fall. History of pulmonary embolism. Evaluate for DVT. EXAM: BILATERAL LOWER EXTREMITY VENOUS DOPPLER ULTRASOUND TECHNIQUE: Gray-scale sonography with graded compression, as well as color Doppler and duplex ultrasound were performed to evaluate the lower extremity deep venous systems from the level of the common femoral vein and including the common femoral, femoral, profunda femoral, popliteal and calf veins including the posterior tibial, peroneal and gastrocnemius veins when visible. The superficial great saphenous vein was also interrogated. Spectral Doppler was utilized  to evaluate flow at rest and with distal augmentation maneuvers in the common femoral, femoral and popliteal veins. COMPARISON:  None Available. FINDINGS: RIGHT LOWER EXTREMITY Common Femoral Vein: There is nonocclusive wall thickening/chronic DVT involving the right common femoral vein (image 2). Saphenofemoral Junction: There is nonocclusive wall thickening involving the saphenofemoral junction (image 7). Profunda Femoral Vein: Appears patent where visualized. Femoral Vein: There is nonocclusive wall thickening/chronic DVT involving the proximal (image 15), mid (images 16 and 17) and distal (images 19 and 20) aspects of the femoral vein. Popliteal Vein: There is nonocclusive wall thickening/chronic DVT involving the right popliteal vein (images 22 and 23, images 25 and 26. Calf Veins: Appear patent where visualized. Superficial Great Saphenous Vein: No evidence of thrombus. Normal compressibility. Other Findings:  None. LEFT LOWER EXTREMITY Common Femoral Vein: There is nonocclusive wall thickening/chronic DVT involving the left common femoral vein (images 34 and 36). Saphenofemoral Junction: There is nonocclusive wall thickening/chronic DVT involving the saphenofemoral junction (image 39). Profunda Femoral Vein: Appears patent where visualized. Femoral Vein: There is nonocclusive wall thickening involving the proximal (images 45 and 46), mid (images 48 and 49 and distal (image 52) aspects of the left femoral vein Popliteal Vein: There is nonocclusive wall thickening involving the left popliteal vein (image 55). Calf Veins: Appear patent where visualized. Superficial Great Saphenous Vein: No evidence of thrombus. Normal compressibility. Other Findings:  None. IMPRESSION: The examination is positive for extensive bilateral non occlusive wall thickening/chronic DVT extending from the level of the bilateral common femoral through the bilateral popliteal veins. No evidence of acute or occlusive DVT within either lower  extremity. Electronically Signed   By: Norleen Roulette M.D.   On: 12/27/2023 16:33   CT HEAD WO CONTRAST ( ) Result Date: 12/27/2023 CLINICAL DATA:  Mental status change, unknown cause EXAM: CT HEAD WITHOUT CONTRAST TECHNIQUE: Contiguous axial images were obtained from the base of the skull through the vertex without intravenous contrast. RADIATION DOSE REDUCTION: This exam was performed according to the departmental dose-optimization program which includes automated exposure control, adjustment of the mA and/or kV according to patient size and/or use of iterative reconstruction technique. COMPARISON:  CT head May 06, 2011. FINDINGS: Brain: No evidence of acute infarction, hemorrhage, hydrocephalus, extra-axial collection or mass lesion/mass effect. Cerebral atrophy. Vascular: No hyperdense vessel. Skull: No acute fracture. Sinuses/Orbits: Mild paranasal sinus mucosal thickening. No acute orbital findings. Right scleral buckle. Other: No mastoid effusions. IMPRESSION: 1. No evidence of acute intracranial abnormality. 2.  Cerebral Atrophy (ICD10-G31.9). Electronically Signed   By: Gilmore GORMAN Molt M.D.   On: 12/27/2023 16:08   DG Abdomen 1 View Result Date: 12/27/2023 CLINICAL DATA:  ET and orogastric tube placement EXAM: ABDOMEN - 1 VIEW COMPARISON:  None Available. FINDINGS: Single view of the left upper quadrant and lower left chest demonstrates an NG tube terminating in the upper abdomen, likely within the gastric body. Anatomy is distorted by the extent of  left hemidiaphragm elevation. No gross free intraperitoneal air. IMPRESSION: A nasogastric tube terminating in the upper abdomen, likely within the gastric body. Anatomic distortion secondary to left hemidiaphragm elevation. Electronically Signed   By: Rockey Kilts M.D.   On: 12/27/2023 14:45   DG Chest Port 1 View Result Date: 12/27/2023 CLINICAL DATA:  Possible sepsis. Status post ET and nasogastric tube placement EXAM: PORTABLE CHEST 1 VIEW  COMPARISON:  03/02/2023 FINDINGS: Marked left hemidiaphragm elevation. Endotracheal tube terminates 5.0 cm above carina. Nasogastric tube terminates in the upper abdomen, likely in the gastric body. Apical lordotic positioning with minimal exclusion of the upper chest. Mild cardiomegaly. No pneumothorax. Cannot exclude layering small right pleural effusion. Nonspecific interstitial coarsening. Left base scarring or atelectasis. IMPRESSION: Appropriate position of endotracheal tube. Nasogastric tube likely terminates in the gastric body. Anatomy is distorted by markedly elevated left hemidiaphragm. Somewhat technique limited portable radiograph. Cannot exclude small layering right pleural effusion. Cardiomegaly. Electronically Signed   By: Rockey Kilts M.D.   On: 12/27/2023 14:43     Assessment & Plan: Mr. Damarien Nyman is a 89 y.o.  male with hypertension, hypothyroidism, neuropathy, COPD, MGUS, history of PE on apixaban , diastolic congestive heart failure and hyperlipidemia, who was admitted to [facility] on 12/27/2023 for Acute hypoxic respiratory failure (HCC) [J96.01]  Acute Kidney Injury: with acute acidosis, hyperkalemia, and hyponatremia. Baseline creatinine of 0.94 on 12/13/23 at [facility]. History of bland urinalysis. Oliguric urine output. CT contrast on 12/9 and 12/7 at [facility]. However out of the window for acute contrast induced ATN. Pending renal ultrasound - Continue CRRT.  - holding losartan - holding furosemide  - monitor for renal recovery.   Acute respiratory failure: requiring intubation and mechanical ventilation. Concern for pneumonia and acute exacerbation of COPD - IV methylprendisolone - ampicillin   Hypotension: secondary to systemic shock from sepsis and volume depletion.  - requiring vasopressor: norepinephrine  gtt.      LOS: 1 Milam Allbaugh 1/1/20258:17 AM

## 2023-12-29 DIAGNOSIS — B952 Enterococcus as the cause of diseases classified elsewhere: Secondary | ICD-10-CM | POA: Diagnosis not present

## 2023-12-29 DIAGNOSIS — R7881 Bacteremia: Secondary | ICD-10-CM

## 2023-12-29 DIAGNOSIS — G9341 Metabolic encephalopathy: Secondary | ICD-10-CM | POA: Diagnosis not present

## 2023-12-29 LAB — GLUCOSE, CAPILLARY
Glucose-Capillary: 134 mg/dL — ABNORMAL HIGH (ref 70–99)
Glucose-Capillary: 115 mg/dL — ABNORMAL HIGH (ref 70–99)
Glucose-Capillary: 126 mg/dL — ABNORMAL HIGH (ref 70–99)
Glucose-Capillary: 119 mg/dL — ABNORMAL HIGH (ref 70–99)
Glucose-Capillary: 128 mg/dL — ABNORMAL HIGH (ref 70–99)
Glucose-Capillary: 129 mg/dL — ABNORMAL HIGH (ref 70–99)

## 2023-12-29 LAB — RENAL FUNCTION PANEL
Albumin: 1.9 g/dL — ABNORMAL LOW (ref 3.5–5.0)
Albumin: 2.2 g/dL — ABNORMAL LOW (ref 3.5–5.0)
Anion gap: 11 (ref 5–15)
Anion gap: 12 (ref 5–15)
BUN: 46 mg/dL — ABNORMAL HIGH (ref 8–23)
BUN: 41 mg/dL — ABNORMAL HIGH (ref 8–23)
CO2: 21 mmol/L — ABNORMAL LOW (ref 22–32)
CO2: 23 mmol/L (ref 22–32)
Calcium: 8.1 mg/dL — ABNORMAL LOW (ref 8.9–10.3)
Calcium: 8.8 mg/dL — ABNORMAL LOW (ref 8.9–10.3)
Chloride: 98 mmol/L (ref 98–111)
Chloride: 96 mmol/L — ABNORMAL LOW (ref 98–111)
Creatinine, Ser: 2.03 mg/dL — ABNORMAL HIGH (ref 0.61–1.24)
Creatinine, Ser: 1.92 mg/dL — ABNORMAL HIGH (ref 0.61–1.24)
GFR, Estimated: 31 mL/min — ABNORMAL LOW (ref >60)
GFR, Estimated: 34 mL/min — ABNORMAL LOW (ref >60)
Glucose, Bld: 124 mg/dL — ABNORMAL HIGH (ref 70–99)
Glucose, Bld: 119 mg/dL — ABNORMAL HIGH (ref 70–99)
Phosphorus: 3 mg/dL (ref 2.5–4.6)
Phosphorus: 3.1 mg/dL (ref 2.5–4.6)
Potassium: 3.9 mmol/L (ref 3.5–5.1)
Potassium: 3.9 mmol/L (ref 3.5–5.1)
Sodium: 130 mmol/L — ABNORMAL LOW (ref 135–145)
Sodium: 131 mmol/L — ABNORMAL LOW (ref 135–145)

## 2023-12-29 LAB — CBC
HCT: 23 % — ABNORMAL LOW (ref 39.0–52.0)
Hemoglobin: 8.1 g/dL — ABNORMAL LOW (ref 13.0–17.0)
MCH: 31.5 pg (ref 26.0–34.0)
MCHC: 35.2 g/dL (ref 30.0–36.0)
MCV: 89.5 fL (ref 80.0–100.0)
Platelets: 150 10*3/uL (ref 150–400)
RBC: 2.57 MIL/uL — ABNORMAL LOW (ref 4.22–5.81)
RDW: 14.4 % (ref 11.5–15.5)
WBC: 12.1 10*3/uL — ABNORMAL HIGH (ref 4.0–10.5)
nRBC: 0.2 % (ref 0.0–0.2)

## 2023-12-29 LAB — MAGNESIUM: Magnesium: 2.1 mg/dL (ref 1.7–2.4)

## 2023-12-29 LAB — PATHOLOGIST SMEAR REVIEW

## 2023-12-29 MED ORDER — PIVOT 1.5 CAL PO LIQD: 1000.0000 mL | ORAL | Status: DC

## 2023-12-29 MED ORDER — SODIUM CHLORIDE 0.9 % IV SOLN
2.0000 g | Freq: Four times a day (QID) | INTRAVENOUS | Status: AC
  Administered 2023-12-29 – 2024-01-10 (×50): 2 g via INTRAVENOUS
  Filled 2023-12-29 (×50): qty 2000

## 2023-12-29 MED ORDER — METHYLPREDNISOLONE SODIUM SUCC 40 MG IJ SOLR
40.0000 mg | Freq: Every day | INTRAMUSCULAR | Status: DC
  Administered 2023-12-30: 40 mg via INTRAVENOUS
  Filled 2023-12-29: qty 1

## 2023-12-29 MED ORDER — PROSOURCE TF20 ENFIT COMPATIBL EN LIQD
60.0000 mL | Freq: Two times a day (BID) | ENTERAL | Status: DC
  Administered 2023-12-30 – 2023-12-31 (×3): 60 mL

## 2023-12-29 MED ORDER — RENA-VITE PO TABS
1.0000 | ORAL_TABLET | Freq: Every day | ORAL | Status: DC
  Administered 2023-12-29 – 2023-12-30 (×2): 1
  Filled 2023-12-29 (×2): qty 1

## 2023-12-29 MED ORDER — FREE WATER
30.0000 mL | Freq: Four times a day (QID) | Status: DC
Start: 2023-12-29 — End: 2024-01-01
  Administered 2023-12-29 – 2023-12-31 (×6): 30 mL

## 2023-12-29 MED ORDER — JUVEN PO PACK
1.0000 | PACK | Freq: Two times a day (BID) | ORAL | Status: DC
  Administered 2023-12-30 – 2023-12-31 (×2): 1

## 2023-12-29 NOTE — Progress Notes (Signed)
 NAME:  Daniel Carr, MRN:  [id number], DOB:  [birth year], LOS: 2 ADMISSION DATE:  12/27/2023, CONSULTATION DATE: 12/27/2023 REFERRING MD: Dr. Dicky, CHIEF COMPLAINT: Shortness of Breath    History of Present Illness:  This is an 89 yo male who presented to [facility] ER via EMS on 12/31 from [facility] with altered mental status and acute respiratory failure.  Pts family reports he has had worsening bilateral lower extremity edema and decreased urinary output.  His PCP increased his outpatient lasix  dose to 40 mg daily, however his urinary output did not improve.  Pts granddaughter reports he also c/o chills and fevers.  He was recently hospitalized at [facility] on 12/02/23 with a right humerus fracture from a mechanical fall on 12/3 and evaluated by ortho who recommended a sling for comfort and outpatient follow-up.  However, during presentation he was found to have progressive dyspnea; severe pulmonary hypertension; hyponatremia; acute blood loss anemia suspected secondary to right chest wall hematoma and acute kidney injury on chronic kidney disease requiring hospital admission.  Following treatment he was discharged on 12/17 to [facility] for rehab and instructed not to resume his outpatient apixaban  until 12/18.  His daughter reports the pts apixaban  has been resumed as instructed.    ED Course  Upon arrival to the ER pt in severe respiratory distress, hypotensive, and minimally responsive requiring mechanical intubation.  Significant lab results were: Na+ 125/K+ 5.9/chloride 86/BUN 72/creatinine 4.46/calcium 8.7/albumin  2.5/AST 143/ALT 89/troponin 550/lactic acid 2.4/wbc 15.9/hgb 11.2/PT 25.0/INR 2.2/APTT 48.  CXR concerning for possible small right pleural effusion.  He met sepsis criteria and received: 2L saline bolus; vancomycin , and aztreonam .  Despite iv fluid resuscitation pt remained hypotensive requiring levophed  gtt.  PCCM team contacted for ICU admission.   12/27/22- patient with improved  mentation post CRRT.  Reducing his sedatives on MV today.  Sepsis bacteremia, ID on case.  He is oozing blood from IV sites, will order DIC panel and pathology slide review.   12/29/23- patient following verbal communication this am, remains in anasarca. Bacteremia being treated. He has CRRT with now removal of fluid at 100/hr.  Starting NG feeds.  Tracheal aspirate with GPCs in pairs.  ID on case continues with ampicillin  reducing steroids and off levophed  today.  Pertinent  Medical History  Arthritis  Glaucoma HTN  Hypothyroidism  HLD Hyponatremia  HFpEF  Right Humerus Fracture secondary to mechanical fall (11/29/2023) Anemia of Chronic Disease  COPD Aortic Stenosis   MGUS PE in 2022 previously on apixaban  but pt self discontinued restarted on 12/14/23 due to CTEPH   Significant Hospital Events: Including procedures, antibiotic start and stop dates in addition to other pertinent events   12/31: Pt admitted with acute hypercapnic respiratory failure, septic shock, AECOPD, pneumonia suspect aspiration, acute kidney injury, acute on chronic hyponatremia, transaminitis, and acute metabolic encephalopathy mechanical intubation    Interim History / Subjective:  Pt sedated with fentanyl  gtt at 25 mcg/hr and mechanically intubated requiring levophed  gtt @4  mcg/min to maintain map 65 or higher.    Objective   Blood pressure 108/62, pulse 78, temperature 98.2 F (36.8 C), temperature source Axillary, resp. rate 17, height 6' 1 (1.854 m), weight 106.6 kg, SpO2 100%.    Vent Mode: PRVC FiO2 (%):  [24 %-30 %] 24 % Set Rate:  [22 bmp] 22 bmp Vt Set:  [500 mL] 500 mL PEEP:  [5 cmH20] 5 cmH20 Plateau Pressure:  [18 cmH20-22 cmH20] 19 cmH20   Intake/Output Summary (Last 24 hours) at  12/29/2023 0850 Last data filed at 12/29/2023 0800 Gross per 24 hour  Intake 1199.69 ml  Output 1811 ml  Net -611.31 ml   Filed Weights   12/27/23 1444 12/28/23 0231 12/29/23 0500  Weight: 107.8 kg 108 kg 106.6  kg   Examination: General: Acute on chronically-ill appearing male, NAD mechanically intubated  HENT: Supple, no JVD present  Lungs: Diminished throughout, even, non labored  Cardiovascular: NSR, s1s2, no r/g, 2+ radial/1+ distal pulses, 3+ bilateral lower extremity pitting edema  Abdomen: +BS x4, obese, soft, non distended  Extremities: Right arm sling present secondary to recent right humerus fracture with 2+ edema  Skin: Intact no rashes/lesions/pressure ulcers present  Neuro: Unresponsive RASS -5, not following commands, bilateral pupils pinpoint, no corneal/gag reflexes present  GU: Outpatient foley exchanged; new indwelling foley draining tea colored urine   Resolved Hospital Problem list     Assessment & Plan:   Acute toxic metabolic encephalopathy  - Avoid sedating medications as able  - Maintain RASS goal 0 to -1 - PAD protocol to maintain RASS goal: propofol  gtt and prn fentanyl   - WUA daily - mental status improved  2. SEPTIC SHOCK- PRESENT ON ADMISSION -Due E fecaelis bacteremia   -ID consult - currently on ampicillin ,   3. Acute on chronic HFpEF  #CTEPH   Hx: HTN, aortic stenosis, PE, and HLD  - Continuous telemetry monitoring  - Trend troponin until peaked  - BNP pending  - Prn levophed  gtt to maintain map 65 or higher  - Heparin  gtt: dosing per pharmacy  - Cooxy pending  - US  Venous right upper extremity and bilateral lower extremities to rule out DVT  - Will consider heart failure team consultation   #Acute hypercapnic respiratory failure  #AECOPD  #Mechanical intubation  - Full vent support for now: vent settings reviewed and established  - Continue lung protective strategies  - Maintain plateau pressures less than 30 cm H2O  - VAP bundle implemented - SBT once all parameters met  - Intermittent CXR's and ABG's - Scheduled and prn bronchodilator therapy  - IV steroids   #Acute kidney injury suspect secondary to ATN and diuretic therapy   #Hyperkalemia  #Acute on chronic hyponatremia  #Lactic acidosis  - Trend BMP and lactic acid  - Replace electrolytes as indicated  - Strict intake and output  - Urine osmolality/Na+ and serum osmolality pending  - Shifting measures administered for hyperkalemia with repeat BMP pending @2000   #Pneumonia suspect aspiration  - Trend WBC and monitor fever curve - Trend PCT  - Follow cultures  - Continue ampicillin  -tracheal aspirate - GPCs   #Transaminitis  - Trend hepatic function panel  - Avoid hepatotoxic medications   #Anemia of chronic disease  - Trend CBC - Monitor for s/sx for bleeding  - Transfuse for hgb <7  #Previous right humerus fracture  - Venous US  to r/o VTE  - Continue sling per outpatient ortho recommendations  - Follow-up with outpatient orthopedic  #Hypothyroidism  - TSH and free T4 pending   #Endo - CBG's q4hrs  - Follow hypo/hyperglycemic protocol  - Target range 140 to 180  Best Practice (right click and Reselect all SmartList Selections daily)   Diet/type: NPO DVT prophylaxis systemic heparin  Pressure ulcer(s): N/A GI prophylaxis: H2B Lines: N/A Foley:  Yes, and it is still needed: outpatient foley exchanged 12/27/23 Code Status:  full code Last date of multidisciplinary goals of care discussion [N/A]  Labs   CBC: Recent Labs  Lab  12/27/23 1202 12/28/23 0427 12/29/23 0501  WBC 15.9* 13.0* 12.1*  NEUTROABS 12.3*  --   --   HGB 11.2* 9.2* 8.1*  HCT 36.3* 26.5* 23.0*  MCV 101.4* 91.1 89.5  PLT 151 152 150    Basic Metabolic Panel: Recent Labs  Lab 12/27/23 1202 12/27/23 1650 12/27/23 1956 12/28/23 0427 12/28/23 1556 12/29/23 0501  NA 125*  --  126* 127* 130* 130*  K 5.9*  --  5.5* 4.6 4.5 3.9  CL 86*  --  87* 91* 94* 98  CO2 26  --  22 23 23  21*  GLUCOSE 99  --  115* 124* 138* 124*  BUN 72*  --  75* 71* 56* 46*  CREATININE 4.46*  --  4.44* 3.62* 2.86* 2.03*  CALCIUM 8.7*  --  8.4* 8.1* 8.4* 8.1*  MG  --  2.2  --  2.0   --  2.1  PHOS  --  6.3*  --  3.8 3.7 3.0   GFR: Estimated Creatinine Clearance: 33.5 mL/min (A) (by C-G formula based on SCr of 2.03 mg/dL (H)). Recent Labs  Lab 12/27/23 1202 12/27/23 1650 12/27/23 1651 12/27/23 1956 12/28/23 0021 12/28/23 0427 12/29/23 0501  PROCALCITON  --  19.58  --   --   --  12.67  --   WBC 15.9*  --   --   --   --  13.0* 12.1*  LATICACIDVEN 2.4*  --  4.0* 3.6* 2.1*  --   --     Liver Function Tests: Recent Labs  Lab 12/27/23 1202 12/28/23 0427 12/28/23 1556 12/29/23 0501  AST 143* 72*  --   --   ALT 89* 59*  --   --   ALKPHOS 80 63  --   --   BILITOT 0.9 0.9  --   --   PROT 6.8 5.4*  --   --   ALBUMIN  2.5* 2.1*  2.0* 2.3* 1.9*   Recent Labs  Lab 12/27/23 1202  LIPASE 25   Recent Labs  Lab 12/27/23 1650 12/28/23 0427  AMMONIA 99* 31    ABG    Component Value Date/Time   PHART 7.51 (H) 12/28/2023 0022   PCO2ART 32 12/28/2023 0022   PO2ART 164 (H) 12/28/2023 0022   HCO3 25.5 12/28/2023 0022   ACIDBASEDEF 3.3 (H) 12/27/2023 1659   O2SAT 99.8 12/28/2023 0022     Coagulation Profile: Recent Labs  Lab 12/27/23 1202  INR 2.2*    Cardiac Enzymes: No results for input(s): CKTOTAL, CKMB, CKMBINDEX, TROPONINI in the last 168 hours.  HbA1C: No results found for: HGBA1C  CBG: Recent Labs  Lab 12/28/23 1603 12/28/23 1922 12/28/23 2353 12/29/23 0359 12/29/23 0754  GLUCAP 132* 129* 124* 134* 115*    Review of Systems:   Unable to assess pt mechanically intubated   Past Medical History:  He,  has a past medical history of Arthritis, childhood infection, Glaucoma, Hypertension, Hypothyroidism, Sinus problem, and Ulnar neuropathy.   Surgical History:   Past Surgical History:  Procedure Laterality Date   APPENDECTOMY     CATARACT EXTRACTION Bilateral    COLONOSCOPY     JOINT REPLACEMENT Right    KNEE ARTHROPLASTY Left 12/24/2020   Procedure: COMPUTER ASSISTED TOTAL KNEE ARTHROPLASTY;  Surgeon: Mardee Lynwood SQUIBB, MD;  Location: [facility] ORS;  Service: Orthopedics;  Laterality: Left;  3RD CASE   KNEE SURGERY Right 10/04/2011   replacement   NM MYOVIEW  ([facility] HX)  07/21/2017   [place]. No ischemia.  RETINAL DETACHMENT REPAIR W/ SCLERAL BUCKLE LE     TEE WITHOUT CARDIOVERSION N/A 04/29/2023   Procedure: TRANSESOPHAGEAL ECHOCARDIOGRAM (TEE);  Surgeon: Hilarie Rocher, MD;  Location: [facility] ORS;  Service: Cardiovascular;  Laterality: N/A;   TONSILLECTOMY       Social History:   reports that he quit smoking about 54 years ago. His smoking use included cigarettes. He started smoking about 60 years ago. He has a 1.5 pack-year smoking history. He has never used smokeless tobacco. He reports that he does not drink alcohol  and does not use drugs.   Family History:  His family history includes Hypertension in his father and mother; Stroke in his father and sister.   Allergies No Known Allergies   Home Medications  Prior to Admission medications   Medication Sig Start Date End Date Taking? Authorizing Provider  albuterol  (VENTOLIN  HFA) 108 (90 Base) MCG/ACT inhaler SMARTSIG:2 Puff(s) By Mouth As Directed    [provider]  aspirin  81 MG EC tablet Take 1 tablet (81 mg total) by mouth daily. Swallow whole. 07/06/21   Gasper Nancyann BRAVO, MD  azelastine  (ASTELIN ) 0.1 % nasal spray Place 1 spray into the nose daily as needed (allergies). 11/09/19   [provider]  brimonidine  (ALPHAGAN ) 0.2 % ophthalmic solution Place 1 drop into both eyes in the morning and at bedtime. 03/26/15   [provider]  clotrimazole -betamethasone  (LOTRISONE ) cream APPLY TOPICALLY ONCE DAILY Patient not taking: Reported on 03/21/2023 04/05/22   Gasper Nancyann BRAVO, MD  dorzolamide  (TRUSOPT ) 2 % ophthalmic solution Apply to eye. 11/08/21   [provider]  DULoxetine  (CYMBALTA ) 30 MG capsule Take 30 mg by mouth daily. 09/13/23 12/11/23  [provider]  dutasteride  (AVODART ) 0.5 MG capsule Take 1 capsule by  mouth once daily 12/23/22   Gasper Nancyann BRAVO, MD  furosemide  (LASIX ) 20 MG tablet Take 20 mg by mouth daily.    [provider]  hydroxychloroquine (PLAQUENIL) 200 MG tablet Take 200 mg by mouth daily. 09/07/23   [provider]  ipratropium (ATROVENT ) 0.06 % nasal spray Place 1 spray into both nostrils 2 (two) times daily as needed for rhinitis. 03/15/18   [provider]  latanoprost  (XALATAN ) 0.005 % ophthalmic solution SMARTSIG:In Eye(s) 03/25/22   [provider]  levocetirizine (XYZAL ) 5 MG tablet Take 5 mg by mouth every evening.    [provider]  levothyroxine  (SYNTHROID ) 150 MCG tablet Take 1 tablet by mouth once daily 12/23/22   [name], [name] E, MD  losartan (COZAAR) 25 MG tablet Take 25 mg by mouth daily.    [provider]  lovastatin  (MEVACOR ) 40 MG tablet Take 1 tablet by mouth once daily 05/09/23   [name], [name] E, MD  Multiple Vitamin (DAILY VITAMIN PO) Take 1 tablet by mouth daily.    [provider]  pregabalin  (LYRICA ) 50 MG capsule Take 1 capsule (50 mg total) by mouth 2 (two) times daily. 09/28/23   Gasper Nancyann BRAVO, MD  vitamin B-12 (CYANOCOBALAMIN) 100 MCG tablet Take 100 mcg by mouth daily. Unsure of dose  is OTC med Patient not taking: Reported on 09/22/2023    [provider]     Critical care provider statement:   Total critical care time: 33 minutes   Performed by: Parris MD   Critical care time was exclusive of separately billable procedures and treating other patients.   Critical care was necessary to treat or prevent imminent or life-threatening deterioration.   Critical care was time  spent personally by me on the following activities: development of treatment plan with patient and/or surrogate as well as nursing, discussions with consultants, evaluation of patient's response to treatment, examination of patient, obtaining history from patient or surrogate, ordering and performing  treatments and interventions, ordering and review of laboratory studies, ordering and review of radiographic studies, pulse oximetry and re-evaluation of patient's condition.    Bryor Rami, M.D.  Pulmonary & Critical Care Medicine

## 2023-12-29 NOTE — Consult Note (Signed)
 NAME: Daniel Carr  DOB: 01-04-1935  MRN: [id number]  Date/Time: 12/29/2023 9:44 AM  REQUESTING PROVIDER: Michaelene Subjective:  REASON FOR CONSULT: enterococcus bacteremia ? Daniel Carr is a 89 y.o. with a history of COPD, left hemidiaphragm paralysis, aortic stenosis , HTN, Hypothyroidism, h/o PE , b/l TKA Recent hpsitalization at [facility] 12/6-12/17 with progressive dyspnea and found to have Severe Pulmonary HTN, chronic co2 retention, CTEPH on eliquis , fracture rt humerus from mechanical fall on 12/3 ( managed non surgical), chronic hyponatremia, urinary retention and foley was placed draining of urine  , started remeron  for appetite and mood and discharged to [facility] on 12/13/23   Presented to [facility] ED on 12/27/23 from [facility] with altered mental status and decreased urine output, inspite of lasix  for severe leg edema. HE was also reported to have chills and fever as per granddaughter Vitals in the ED 110/74, HR 106, Temp 96.4, RR 16 NA was 125, cr 4.46 Wbc 15.9, Hb 11.2 VBG- ph 7.17, Pco2 75 CXR showed small rt Pleural effusion Troponin was 550 Blood culture sent and he was started on vanco and aztreonam  Pt was started on pressors as BP was low inspite of fluids. He was intubated for acute hypoxic resp failure and in ICU I am seeing patient because of enterococcus in blood culture  Past Medical History:  Diagnosis Date   Arthritis    childhood infection    chicken pox, measels, and mumps   Glaucoma    Hypertension    Hypothyroidism    Sinus problem    Ulnar neuropathy    bilateral    Past Surgical History:  Procedure Laterality Date   APPENDECTOMY     CATARACT EXTRACTION Bilateral    COLONOSCOPY     JOINT REPLACEMENT Right    KNEE ARTHROPLASTY Left 12/24/2020   Procedure: COMPUTER ASSISTED TOTAL KNEE ARTHROPLASTY;  Surgeon: Mardee Lynwood SQUIBB, MD;  Location: [facility] ORS;  Service: Orthopedics;  Laterality: Left;  3RD CASE   KNEE SURGERY Right 10/04/2011   replacement    NM MYOVIEW  ([facility] HX)  07/21/2017   KC. No ischemia.    RETINAL DETACHMENT REPAIR W/ SCLERAL BUCKLE LE     TEE WITHOUT CARDIOVERSION N/A 04/29/2023   Procedure: TRANSESOPHAGEAL ECHOCARDIOGRAM (TEE);  Surgeon: Hilarie Rocher, MD;  Location: [facility] ORS;  Service: Cardiovascular;  Laterality: N/A;   TONSILLECTOMY      Social History   Socioeconomic History   Marital status: Married    Spouse name: Not on file   Number of children: 1   Years of education: college   Highest education level: Some college, no degree  Occupational History   Occupation: Retired  Tobacco Use   Smoking status: Former    Current packs/day: 0.00    Average packs/day: 0.3 packs/day for 6.0 years (1.5 ttl pk-yrs)    Types: Cigarettes    Start date: 11/14/1963    Quit date: 11/13/1969    Years since quitting: 54.1   Smokeless tobacco: Never   Tobacco comments:    quit in 1970's  Vaping Use   Vaping status: Never Used  Substance and Sexual Activity   Alcohol  use: No    Alcohol /week: 0.0 standard drinks of alcohol    Drug use: No   Sexual activity: Yes  Other Topics Concern   Not on file  Social History Narrative   Not on file   Social Drivers of Health   Financial Resource Strain: Low Risk  (11/22/2023)   Received from [facility]   Overall Financial Resource Strain (CARDIA)    Difficulty of Paying Living Expenses: Not hard at all  Food Insecurity: Patient Unable To Answer (12/28/2023)   Hunger Vital Sign    Worried About Running Out of Food in the Last Year: Patient unable to answer    Ran Out of Food in the Last Year: Patient unable to answer  Transportation Needs: Patient Unable To Answer (12/28/2023)   PRAPARE - Transportation    Lack of Transportation (Medical): Patient unable to answer    Lack of Transportation (Non-Medical): Patient unable to answer  Physical Activity: Inactive (09/25/2023)   Exercise Vital Sign    Days of Exercise per Week: 0 days    Minutes of Exercise per  Session: 0 min  Stress: No Stress Concern Present (09/25/2023)   [employer] of Occupational Health - Occupational Stress Questionnaire    Feeling of Stress : Not at all  Social Connections: Unknown (12/28/2023)   Social Connection and Isolation Panel [NHANES]    Frequency of Communication with Friends and Family: Patient unable to answer    Frequency of Social Gatherings with Friends and Family: Patient unable to answer    Attends Religious Services: Patient unable to answer    Active Member of Clubs or Organizations: No    Attends Banker Meetings: Never    Marital Status: Married  Catering Manager Violence: Patient Unable To Answer (12/28/2023)   Humiliation, Afraid, Rape, and Kick questionnaire    Fear of Current or Ex-Partner: Patient unable to answer    Emotionally Abused: Patient unable to answer    Physically Abused: Patient unable to answer    Sexually Abused: Patient unable to answer    Family History  Problem Relation Age of Onset   Stroke Sister    Hypertension Mother    Stroke Father    Hypertension Father    No Known Allergies I? Current Facility-Administered Medications  Medication Dose Route Frequency Provider Last Rate Last Admin   albumin  human 25 % solution 12.5 g  12.5 g Intravenous Daily Aleskerov, Fuad, MD   Stopped at 12/28/23 1517   ampicillin  (OMNIPEN) 2 g in sodium chloride  0.9 % 100 mL IVPB  2 g Intravenous Q6H Chappell, Alex B, RPH       Chlorhexidine  Gluconate Cloth 2 % PADS 6 each  6 each Topical Daily Kathrene Almarie Bake, NP   6 each at 12/28/23 1013   docusate (COLACE) 50 MG/5ML liquid 100 mg  100 mg Per Tube BID PRN Nelson, Dana G, NP       docusate (COLACE) 50 MG/5ML liquid 100 mg  100 mg Per Tube BID Nelson, Dana G, NP   100 mg at 12/28/23 2205   famotidine  (PEPCID ) tablet 20 mg  20 mg Per Tube Daily Niels Kayla FALCON, [facility]       fentaNYL  (SUBLIMAZE ) bolus via infusion 25-100 mcg  25-100 mcg Intravenous Q15 min PRN Kathrene Almarie Bake, NP   50 mcg at 12/28/23 1405   fentaNYL  (SUBLIMAZE ) injection 25-100 mcg  25-100 mcg Intravenous Q30 min PRN Nelson, Dana G, NP       fentaNYL  in NS (10mcg/ml) infusion-PREMIX  25-200 mcg/hr Intravenous Continuous Kathrene Almarie Bake, NP 10 mL/hr at 12/29/23 0900 100 mcg/hr at 12/29/23 0900   heparin  injection 1,000-6,000 Units  1,000-6,000 Units CRRT PRN Kolluru, Woodward, MD       heparin  injection 5,000 Units  5,000 Units Subcutaneous Q8H Parris Manna, MD  5,000 Units at 12/29/23 0602   ipratropium-albuterol  (DUONEB) 0.5-2.5 (3) MG/3ML nebulizer solution 3 mL  3 mL Nebulization Q6H Nelson, Dana G, NP   3 mL at 12/29/23 0724   ipratropium-albuterol  (DUONEB) 0.5-2.5 (3) MG/3ML nebulizer solution 3 mL  3 mL Nebulization Q6H PRN Nelson, Dana G, NP       methylPREDNISolone  sodium succinate (SOLU-MEDROL ) 40 mg/mL injection 40 mg  40 mg Intravenous Q12H Nelson, Dana G, NP   40 mg at 12/29/23 0409   multivitamin (RENA-VIT) tablet 1 tablet  1 tablet Oral QHS Aleskerov, Fuad, MD   1 tablet at 12/28/23 2206   norepinephrine  (LEVOPHED ) 16 mg in (0.064 mg/mL) premix infusion  0-40 mcg/min Intravenous Titrated Ouma, Elizabeth Achieng, NP   Stopped at 12/29/23 0224   Oral care mouth rinse  15 mL Mouth Rinse Q2H Kathrene Almarie Bake, NP   15 mL at 12/29/23 0756   Oral care mouth rinse  15 mL Mouth Rinse PRN Kathrene Almarie Bake, NP       polyethylene glycol (MIRALAX  / GLYCOLAX ) packet 17 g  17 g Per Tube Daily PRN Nelson, Dana G, NP       polyethylene glycol (MIRALAX  / GLYCOLAX ) packet 17 g  17 g Per Tube Daily Nelson, Dana G, NP   17 g at 12/28/23 1100   PrismaSol  BGK 2/3.5 infusion   CRRT Continuous Kolluru, Sarath, MD 400 mL/hr at 12/27/23 2325 New Bag at 12/27/23 2325   PrismaSol  BGK 2/3.5 infusion   CRRT Continuous Kolluru, Woodward, MD 400 mL/hr at 12/27/23 2325 New Bag at 12/27/23 2325   PrismaSol  BGK 2/3.5 infusion   CRRT Continuous Kolluru, Woodward, MD  1,000 mL/hr at 12/28/23 0443 New Bag at 12/28/23 0443   propofol  (DIPRIVAN ) 1000 MG/100ML infusion  0-50 mcg/kg/min Intravenous Continuous Nelson, Dana G, NP 22.6 mL/hr at 12/29/23 0800 35 mcg/kg/min at 12/29/23 0800   sodium chloride  0.9 % primer fluid for CRRT   CRRT PRN Kolluru, Woodward, MD         Abtx:  Anti-infectives (From admission, onward)    Start     Dose/Rate Route Frequency Ordered Stop   12/29/23 1200  ampicillin  (OMNIPEN) 2 g in sodium chloride  0.9 % 100 mL IVPB        2 g 300 mL/hr over 20 Minutes Intravenous Every 6 hours 12/29/23 0740     12/28/23 0700  ampicillin  (OMNIPEN) 2 g in sodium chloride  0.9 % 100 mL IVPB  Status:  Discontinued        2 g 300 mL/hr over 20 Minutes Intravenous Every 8 hours 12/28/23 0308 12/29/23 0740   12/27/23 2200  piperacillin -tazobactam (ZOSYN ) IVPB 2.25 g  Status:  Discontinued        2.25 g 100 mL/hr over 30 Minutes Intravenous Every 8 hours 12/27/23 1530 12/28/23 0307   12/27/23 1245  aztreonam  (AZACTAM ) 2 g in sodium chloride  0.9 % 100 mL IVPB        2 g 200 mL/hr over 30 Minutes Intravenous  Once 12/27/23 1233 12/27/23 1343   12/27/23 1245  vancomycin  (VANCOCIN ) IVPB 1000 mg/200 mL premix        1,000 mg 200 mL/hr over 60 Minutes Intravenous  Once 12/27/23 1233 12/27/23 1446       REVIEW OF SYSTEMS:  NA Objective:  VITALS:  BP 118/63   Pulse 82   Temp 98.2 F (36.8 C) (Axillary)   Resp (!) 24   Ht 6' 1 (1.854 m)  Wt 106.6 kg   SpO2 100%   BMI 31.01 kg/m  LDA Left internal jugular HD  Left femoral A line Foley PHYSICAL EXAM:  General:Intubated, sedated Head: Normocephalic, without obvious abnormality, atraumatic. Eyes: Conjunctivae clear, anicteric sclerae. Pupils are equal ENT cannot examine  Lungs: b/l air entry- decreased bases. Heart: s1s2. Abdomen: Soft, non-tender,not distended. Bowel sounds normal. No masses Extremities:rt arm swollen  edema legs left > rt Hands have arthritis changes Skin: No rashes  or lesions. Or bruising Lymph: Cervical, supraclavicular normal. Neurologic: cannot assess Pertinent Labs Lab Results CBC    Component Value Date/Time   WBC 12.1 (H) 12/29/2023 0501   RBC 2.57 (L) 12/29/2023 0501   HGB 8.1 (L) 12/29/2023 0501   HGB 13.1 10/26/2022 1015   HCT 23.0 (L) 12/29/2023 0501   HCT 37.2 (L) 10/26/2022 1015   PLT 150 12/29/2023 0501   PLT 205 10/26/2022 1015   MCV 89.5 12/29/2023 0501   MCV 91 10/26/2022 1015   MCH 31.5 12/29/2023 0501   MCHC 35.2 12/29/2023 0501   RDW 14.4 12/29/2023 0501   RDW 12.0 10/26/2022 1015   LYMPHSABS 1.2 12/27/2023 1202   MONOABS 2.0 (H) 12/27/2023 1202   EOSABS 0.0 12/27/2023 1202   BASOSABS 0.1 12/27/2023 1202       Latest Ref Rng & Units 12/29/2023    5:01 AM 12/28/2023    3:56 PM 12/28/2023    4:27 AM  CMP  Glucose 70 - 99 mg/dL 875  861  875   BUN 8 - 23 mg/dL 46  56  71   Creatinine 0.61 - 1.24 mg/dL 7.96  7.13  6.37   Sodium 135 - 145 mmol/L 130  130  127   Potassium 3.5 - 5.1 mmol/L 3.9  4.5  4.6   Chloride 98 - 111 mmol/L 98  94  91   CO2 22 - 32 mmol/L 21  23  23    Calcium 8.9 - 10.3 mg/dL 8.1  8.4  8.1   Total Protein 6.5 - 8.1 g/dL   5.4   Total Bilirubin 0.0 - 1.2 mg/dL   0.9   Alkaline Phos 38 - 126 U/L   63   AST 15 - 41 U/L   72   ALT 0 - 44 U/L   59       Microbiology: Recent Results (from the past 240 hours)  Blood Culture (routine x 2)     Status: None (Preliminary result)   Collection Time: 12/27/23 12:02 PM   Specimen: BLOOD  Result Value Ref Range Status   Specimen Description BLOOD BLOOD RIGHT HAND  Final   Special Requests   Final    BOTTLES DRAWN AEROBIC AND ANAEROBIC Blood Culture adequate volume   Culture  Setup Time   Final    GRAM POSITIVE COCCI IN BOTH AEROBIC AND ANAEROBIC BOTTLES Organism ID to follow ENTEROCOCCUS FAECALIS CRITICAL RESULT CALLED TO, READ BACK BY AND VERIFIED WITHBETHA SELINDA SIMPERS @ 0254 12/28/23 [facility] Performed at [facility], [address]  [place], [place] [address]    Culture [facility] POSITIVE COCCI  Final   Report Status PENDING  Incomplete  Blood Culture ID Panel (Reflexed)     Status: Abnormal   Collection Time: 12/27/23 12:02 PM  Result Value Ref Range Status   Enterococcus faecalis DETECTED (A) NOT DETECTED Final    Comment: CRITICAL RESULT CALLED TO, READ BACK BY AND VERIFIED WITH: JASON ROBBINS @ 0245 12/28/23 [facility]    Enterococcus  Faecium NOT DETECTED NOT DETECTED Final   Listeria monocytogenes NOT DETECTED NOT DETECTED Final   Staphylococcus species NOT DETECTED NOT DETECTED Final   Staphylococcus aureus (BCID) NOT DETECTED NOT DETECTED Final   Staphylococcus epidermidis NOT DETECTED NOT DETECTED Final   Staphylococcus lugdunensis NOT DETECTED NOT DETECTED Final   Streptococcus species NOT DETECTED NOT DETECTED Final   Streptococcus agalactiae NOT DETECTED NOT DETECTED Final   Streptococcus pneumoniae NOT DETECTED NOT DETECTED Final   Streptococcus pyogenes NOT DETECTED NOT DETECTED Final   A.calcoaceticus-baumannii NOT DETECTED NOT DETECTED Final   Bacteroides fragilis NOT DETECTED NOT DETECTED Final   Enterobacterales NOT DETECTED NOT DETECTED Final   Enterobacter cloacae complex NOT DETECTED NOT DETECTED Final   Escherichia coli NOT DETECTED NOT DETECTED Final   Klebsiella aerogenes NOT DETECTED NOT DETECTED Final   Klebsiella oxytoca NOT DETECTED NOT DETECTED Final   Klebsiella pneumoniae NOT DETECTED NOT DETECTED Final   Proteus species NOT DETECTED NOT DETECTED Final   Salmonella species NOT DETECTED NOT DETECTED Final   Serratia marcescens NOT DETECTED NOT DETECTED Final   Haemophilus influenzae NOT DETECTED NOT DETECTED Final   Neisseria meningitidis NOT DETECTED NOT DETECTED Final   Pseudomonas aeruginosa NOT DETECTED NOT DETECTED Final   Stenotrophomonas maltophilia NOT DETECTED NOT DETECTED Final   Candida albicans NOT DETECTED NOT DETECTED Final   Candida auris NOT DETECTED NOT DETECTED Final   Candida  glabrata NOT DETECTED NOT DETECTED Final   Candida krusei NOT DETECTED NOT DETECTED Final   Candida parapsilosis NOT DETECTED NOT DETECTED Final   Candida tropicalis NOT DETECTED NOT DETECTED Final   Cryptococcus neoformans/gattii NOT DETECTED NOT DETECTED Final   Vancomycin  resistance NOT DETECTED NOT DETECTED Final    Comment: Performed at [facility], [address] [place], [place] [address]  MRSA Next Gen by PCR, Nasal     Status: None   Collection Time: 12/27/23  4:44 PM   Specimen: Nasal Mucosa; Nasal Swab  Result Value Ref Range Status   MRSA by PCR Next Gen NOT DETECTED NOT DETECTED Final    Comment: (NOTE) The GeneXpert MRSA Assay (FDA approved for NASAL specimens only), is one component of a comprehensive MRSA colonization surveillance program. It is not intended to diagnose MRSA infection nor to guide or monitor treatment for MRSA infections. Test performance is not FDA approved in patients less than 78 years old. Performed at [facility], [address] [place], [place] [address]   Resp panel by RT-PCR (RSV, Flu A&B, Covid) Anterior Nasal Swab     Status: None   Collection Time: 12/27/23  4:48 PM   Specimen: Anterior Nasal Swab  Result Value Ref Range Status   SARS Coronavirus 2 by RT PCR NEGATIVE NEGATIVE Final    Comment: (NOTE) SARS-CoV-2 target nucleic acids are NOT DETECTED.  The SARS-CoV-2 RNA is generally detectable in upper respiratory specimens during the acute phase of infection. The lowest concentration of SARS-CoV-2 viral copies this assay can detect is 138 copies/mL. A negative result does not preclude SARS-Cov-2 infection and should not be used as the sole basis for treatment or other patient management decisions. A negative result may occur with  improper specimen collection/handling, submission of specimen other than nasopharyngeal swab, presence of viral mutation(s) within the areas targeted by this assay, and inadequate  number of viral copies(<138 copies/mL). A negative result must be combined with clinical observations, patient history, and epidemiological information. The expected result is Negative.  Fact Sheet for  Patients:  bloggercourse.com  Fact Sheet for Healthcare Providers:  seriousbroker.it  This test is no t yet approved or cleared by the [place]  FDA and  has been authorized for detection and/or diagnosis of SARS-CoV-2 by FDA under an Emergency Use Authorization (EUA). This EUA will remain  in effect (meaning this test can be used) for the duration of the COVID-19 declaration under Section 564(b)(1) of the Act, 21 U.S.C.section 360bbb-3(b)(1), unless the authorization is terminated  or revoked sooner.       Influenza A by PCR NEGATIVE NEGATIVE Final   Influenza B by PCR NEGATIVE NEGATIVE Final    Comment: (NOTE) The Xpert Xpress SARS-CoV-2/FLU/RSV plus assay is intended as an aid in the diagnosis of influenza from Nasopharyngeal swab specimens and should not be used as a sole basis for treatment. Nasal washings and aspirates are unacceptable for Xpert Xpress SARS-CoV-2/FLU/RSV testing.  Fact Sheet for Patients: bloggercourse.com  Fact Sheet for Healthcare Providers: seriousbroker.it  This test is not yet approved or cleared by the [place]  FDA and has been authorized for detection and/or diagnosis of SARS-CoV-2 by FDA under an Emergency Use Authorization (EUA). This EUA will remain in effect (meaning this test can be used) for the duration of the COVID-19 declaration under Section 564(b)(1) of the Act, 21 U.S.C. section 360bbb-3(b)(1), unless the authorization is terminated or revoked.     Resp Syncytial Virus by PCR NEGATIVE NEGATIVE Final    Comment: (NOTE) Fact Sheet for Patients: bloggercourse.com  Fact Sheet for Healthcare  Providers: seriousbroker.it  This test is not yet approved or cleared by the [place]  FDA and has been authorized for detection and/or diagnosis of SARS-CoV-2 by FDA under an Emergency Use Authorization (EUA). This EUA will remain in effect (meaning this test can be used) for the duration of the COVID-19 declaration under Section 564(b)(1) of the Act, 21 U.S.C. section 360bbb-3(b)(1), unless the authorization is terminated or revoked.  Performed at [facility], [address] [place], [place] [address]   Blood Culture (routine x 2)     Status: None (Preliminary result)   Collection Time: 12/27/23  4:50 PM   Specimen: BLOOD  Result Value Ref Range Status   Specimen Description BLOOD BLOOD LEFT HAND  Final   Special Requests   Final    BOTTLES DRAWN AEROBIC ONLY Blood Culture results may not be optimal due to an inadequate volume of blood received in culture bottles   Culture   Final    NO GROWTH 2 DAYS Performed at [facility], [address], [place], [place] [address]    Report Status PENDING  Incomplete  Culture, Respiratory w Gram Stain     Status: None (Preliminary result)   Collection Time: 12/28/23 11:22 PM   Specimen: Tracheal Aspirate; Respiratory  Result Value Ref Range Status   Specimen Description   Final    TRACHEAL ASPIRATE Performed at [facility], [address] [place], [place] [address]    Special Requests   Final    NONE Performed at [facility], [address] [place], [place] [address]    Gram Stain   Final    FEW WBC PRESENT, PREDOMINANTLY PMN FEW GRAM POSITIVE COCCI IN PAIRS FEW GRAM VARIABLE ROD Performed at [facility], [address] [place], [place] [address]    Culture PENDING  Incomplete   Report Status PENDING  Incomplete    IMAGING RESULTS: CT Head -cerebral atrophy CXR- cardiomegaly, small rt pleural  effusion  I have personally reviewed the  films ? Impression/Recommendation  Acute metabolic encephalopathy  Enterococcus fecalis bacteremia with septic shock- likely source is complicated UTI as he has indwelling foley On ampicillin  2 d echo May need TEE  Acute Hypercapnic c resp failure- mechanically ventilated COPD exacerbation  CTEPH  AKI secondary to ATN/  Septic shock with lactic acidosis  Urinary retention- has foley  Recent rt humeral fracture- managed conservatively  Hyponatremia ? Anemia? ? ________________________________________________ Discussed with care team and his granddaughter

## 2023-12-29 NOTE — TOC CM/SW Note (Signed)
 Transition of Care [facility]) - Inpatient Brief Assessment   Patient Details  Name: Daniel Carr MRN: [id number] Date of Birth: December 08, 1935  Transition of Care [facility]) CM/SW Contact:    Daniel [name], LCSW Phone Number: 12/29/2023, 3:27 PM   Clinical Narrative: CSW reviewed chart. Patient was discharged from [facility] to [facility] on 12/17 until he was admitted here on 12/31 (14 days). Patient is currently on the ventilator. CSW will continue to follow progress. If plan is for patient to return to SNF at discharge, he will need PT and OT consults when appropriate. CSW will continue to follow progress.  Transition of Care Asessment: Insurance and Status: Insurance coverage has been reviewed Patient has primary care physician: Yes Home environment has been reviewed: Single family home Prior level of function:: Not documented Prior/Current Home Services: No current home services Social Drivers of Health Review: SDOH reviewed no interventions necessary Readmission risk has been reviewed: Yes Transition of care needs: transition of care needs identified, TOC will continue to follow

## 2023-12-29 NOTE — Progress Notes (Signed)
 [facility]  ROUNDING NOTE   Subjective:   Patient initiated on CRRT yesterday. Tolerating well. Continues to have hyponatremia with serum sodium of 130. Potassium normal at 3.9 at the moment. Creatinine trended down to 2.03. Does appear to be producing some urine.  Objective:  Vital signs in last 24 hours:  Temp:  [97.9 F (36.6 C)-98.7 F (37.1 C)] 97.9 F (36.6 C) (01/02 1200) Pulse Rate:  [77-98] 78 (01/02 1400) Resp:  [15-43] 28 (01/02 1400) BP: (97-144)/(50-112) 100/53 (01/02 1400) SpO2:  [96 %-100 %] 100 % (01/02 1400) Arterial Line BP: (102-196)/(49-95) 120/58 (01/02 1400) FiO2 (%):  [24 %-30 %] 24 % (01/02 1517) Weight:  [106.6 kg] 106.6 kg (01/02 0500)  Weight change: -17.1 kg Filed Weights   12/27/23 1444 12/28/23 0231 12/29/23 0500  Weight: 107.8 kg 108 kg 106.6 kg    Intake/Output: I/O last 3 completed shifts: In: 2361.1 [P.O.:180; I.V.:1690.4; IV Piggyback:490.7] Out: 2791 [Urine:1005; Emesis/NG output:100]   Intake/Output this shift:  Total I/O In: 267.5 [I.V.:117.3; IV Piggyback:150.2] Out: 1060   Physical Exam: General: Critically ill-appearing  Head: Endotracheal tube in place  Neck: Supple  Lungs:  Scattered rhonchi, vent assisted  Heart: S1S2 no rubs  Abdomen:  Soft, nontender, bowel sounds present  Extremities: 2+ peripheral edema.  Neurologic: Intubated  Skin: No acute rash  Access: Left IJ temporary dialysis catheter    Basic Metabolic Panel: Recent Labs  Lab 12/27/23 1202 12/27/23 1650 12/27/23 1956 12/28/23 0427 12/28/23 1556 12/29/23 0501  NA 125*  --  126* 127* 130* 130*  K 5.9*  --  5.5* 4.6 4.5 3.9  CL 86*  --  87* 91* 94* 98  CO2 26  --  22 23 23  21*  GLUCOSE 99  --  115* 124* 138* 124*  BUN 72*  --  75* 71* 56* 46*  CREATININE 4.46*  --  4.44* 3.62* 2.86* 2.03*  CALCIUM 8.7*  --  8.4* 8.1* 8.4* 8.1*  MG  --  2.2  --  2.0  --  2.1  PHOS  --  6.3*  --  3.8 3.7 3.0    Liver Function Tests: Recent  Labs  Lab 12/27/23 1202 12/28/23 0427 12/28/23 1556 12/29/23 0501  AST 143* 72*  --   --   ALT 89* 59*  --   --   ALKPHOS 80 63  --   --   BILITOT 0.9 0.9  --   --   PROT 6.8 5.4*  --   --   ALBUMIN  2.5* 2.1*  2.0* 2.3* 1.9*   Recent Labs  Lab 12/27/23 1202  LIPASE 25   Recent Labs  Lab 12/27/23 1650 12/28/23 0427  AMMONIA 99* 31    CBC: Recent Labs  Lab 12/27/23 1202 12/28/23 0427 12/29/23 0501  WBC 15.9* 13.0* 12.1*  NEUTROABS 12.3*  --   --   HGB 11.2* 9.2* 8.1*  HCT 36.3* 26.5* 23.0*  MCV 101.4* 91.1 89.5  PLT 151 152 150    Cardiac Enzymes: No results for input(s): CKTOTAL, CKMB, CKMBINDEX, TROPONINI in the last 168 hours.  BNP: Invalid input(s): POCBNP  CBG: Recent Labs  Lab 12/28/23 1922 12/28/23 2353 12/29/23 0359 12/29/23 0754 12/29/23 1145  GLUCAP 129* 124* 134* 115* 126*    Microbiology: Results for orders placed or performed during the hospital encounter of 12/27/23  Blood Culture (routine x 2)     Status: Abnormal (Preliminary result)   Collection Time: 12/27/23 12:02 PM  Specimen: BLOOD  Result Value Ref Range Status   Specimen Description   Final    BLOOD BLOOD RIGHT HAND Performed at [facility], [address], [place], [place] [address]    Special Requests   Final    BOTTLES DRAWN AEROBIC AND ANAEROBIC Blood Culture adequate volume Performed at [facility], [address] [place], [place] [address]    Culture  Setup Time   Final    GRAM POSITIVE COCCI IN BOTH AEROBIC AND ANAEROBIC BOTTLES Organism ID to follow ENTEROCOCCUS FAECALIS CRITICAL RESULT CALLED TO, READ BACK BY AND VERIFIED WITH: JASON ROBBINS @ 0254 12/28/23 [facility] Performed at [facility], [address] [place], [place] [address]    Culture (A)  Final    ENTEROCOCCUS FAECALIS SUSCEPTIBILITIES TO FOLLOW Performed at [facility], [address], [place], [place] [address]    Report Status PENDING  Incomplete   Blood Culture ID Panel (Reflexed)     Status: Abnormal   Collection Time: 12/27/23 12:02 PM  Result Value Ref Range Status   Enterococcus faecalis DETECTED (A) NOT DETECTED Final    Comment: CRITICAL RESULT CALLED TO, READ BACK BY AND VERIFIED WITH: JASON ROBBINS @ 0245 12/28/23 [facility]    Enterococcus Faecium NOT DETECTED NOT DETECTED Final   Listeria monocytogenes NOT DETECTED NOT DETECTED Final   Staphylococcus species NOT DETECTED NOT DETECTED Final   Staphylococcus aureus (BCID) NOT DETECTED NOT DETECTED Final   Staphylococcus epidermidis NOT DETECTED NOT DETECTED Final   Staphylococcus lugdunensis NOT DETECTED NOT DETECTED Final   Streptococcus species NOT DETECTED NOT DETECTED Final   Streptococcus agalactiae NOT DETECTED NOT DETECTED Final   Streptococcus pneumoniae NOT DETECTED NOT DETECTED Final   Streptococcus pyogenes NOT DETECTED NOT DETECTED Final   A.calcoaceticus-baumannii NOT DETECTED NOT DETECTED Final   Bacteroides fragilis NOT DETECTED NOT DETECTED Final   Enterobacterales NOT DETECTED NOT DETECTED Final   Enterobacter cloacae complex NOT DETECTED NOT DETECTED Final   Escherichia coli NOT DETECTED NOT DETECTED Final   Klebsiella aerogenes NOT DETECTED NOT DETECTED Final   Klebsiella oxytoca NOT DETECTED NOT DETECTED Final   Klebsiella pneumoniae NOT DETECTED NOT DETECTED Final   Proteus species NOT DETECTED NOT DETECTED Final   Salmonella species NOT DETECTED NOT DETECTED Final   Serratia marcescens NOT DETECTED NOT DETECTED Final   Haemophilus influenzae NOT DETECTED NOT DETECTED Final   Neisseria meningitidis NOT DETECTED NOT DETECTED Final   Pseudomonas aeruginosa NOT DETECTED NOT DETECTED Final   Stenotrophomonas maltophilia NOT DETECTED NOT DETECTED Final   Candida albicans NOT DETECTED NOT DETECTED Final   Candida auris NOT DETECTED NOT DETECTED Final   Candida glabrata NOT DETECTED NOT DETECTED Final   Candida krusei NOT DETECTED NOT DETECTED Final   Candida  parapsilosis NOT DETECTED NOT DETECTED Final   Candida tropicalis NOT DETECTED NOT DETECTED Final   Cryptococcus neoformans/gattii NOT DETECTED NOT DETECTED Final   Vancomycin  resistance NOT DETECTED NOT DETECTED Final    Comment: Performed at [facility], [address] [place], [place] [address]  MRSA Next Gen by PCR, Nasal     Status: None   Collection Time: 12/27/23  4:44 PM   Specimen: Nasal Mucosa; Nasal Swab  Result Value Ref Range Status   MRSA by PCR Next Gen NOT DETECTED NOT DETECTED Final    Comment: (NOTE) The GeneXpert MRSA Assay (FDA approved for NASAL specimens only), is one component of a comprehensive MRSA colonization surveillance program. It  is not intended to diagnose MRSA infection nor to guide or monitor treatment for MRSA infections. Test performance is not FDA approved in patients less than 65 years old. Performed at [facility], [address] [place], [place] [address]   Resp panel by RT-PCR (RSV, Flu A&B, Covid) Anterior Nasal Swab     Status: None   Collection Time: 12/27/23  4:48 PM   Specimen: Anterior Nasal Swab  Result Value Ref Range Status   SARS Coronavirus 2 by RT PCR NEGATIVE NEGATIVE Final    Comment: (NOTE) SARS-CoV-2 target nucleic acids are NOT DETECTED.  The SARS-CoV-2 RNA is generally detectable in upper respiratory specimens during the acute phase of infection. The lowest concentration of SARS-CoV-2 viral copies this assay can detect is 138 copies/mL. A negative result does not preclude SARS-Cov-2 infection and should not be used as the sole basis for treatment or other patient management decisions. A negative result may occur with  improper specimen collection/handling, submission of specimen other than nasopharyngeal swab, presence of viral mutation(s) within the areas targeted by this assay, and inadequate number of viral copies(<138 copies/mL). A negative result must be combined with clinical observations,  patient history, and epidemiological information. The expected result is Negative.  Fact Sheet for Patients:  bloggercourse.com  Fact Sheet for Healthcare Providers:  seriousbroker.it  This test is no t yet approved or cleared by the [place]  FDA and  has been authorized for detection and/or diagnosis of SARS-CoV-2 by FDA under an Emergency Use Authorization (EUA). This EUA will remain  in effect (meaning this test can be used) for the duration of the COVID-19 declaration under Section 564(b)(1) of the Act, 21 U.S.C.section 360bbb-3(b)(1), unless the authorization is terminated  or revoked sooner.       Influenza A by PCR NEGATIVE NEGATIVE Final   Influenza B by PCR NEGATIVE NEGATIVE Final    Comment: (NOTE) The Xpert Xpress SARS-CoV-2/FLU/RSV plus assay is intended as an aid in the diagnosis of influenza from Nasopharyngeal swab specimens and should not be used as a sole basis for treatment. Nasal washings and aspirates are unacceptable for Xpert Xpress SARS-CoV-2/FLU/RSV testing.  Fact Sheet for Patients: bloggercourse.com  Fact Sheet for Healthcare Providers: seriousbroker.it  This test is not yet approved or cleared by the [place]  FDA and has been authorized for detection and/or diagnosis of SARS-CoV-2 by FDA under an Emergency Use Authorization (EUA). This EUA will remain in effect (meaning this test can be used) for the duration of the COVID-19 declaration under Section 564(b)(1) of the Act, 21 U.S.C. section 360bbb-3(b)(1), unless the authorization is terminated or revoked.     Resp Syncytial Virus by PCR NEGATIVE NEGATIVE Final    Comment: (NOTE) Fact Sheet for Patients: bloggercourse.com  Fact Sheet for Healthcare Providers: seriousbroker.it  This test is not yet approved or cleared by the [place]  [place] FDA and has been authorized for detection and/or diagnosis of SARS-CoV-2 by FDA under an Emergency Use Authorization (EUA). This EUA will remain in effect (meaning this test can be used) for the duration of the COVID-19 declaration under Section 564(b)(1) of the Act, 21 U.S.C. section 360bbb-3(b)(1), unless the authorization is terminated or revoked.  Performed at [facility], [address] [place], [place] [address]   Blood Culture (routine x 2)     Status: None (Preliminary result)   Collection Time: 12/27/23  4:50 PM   Specimen: BLOOD  Result Value Ref Range Status   Specimen Description BLOOD BLOOD  LEFT HAND  Final   Special Requests   Final    BOTTLES DRAWN AEROBIC ONLY Blood Culture results may not be optimal due to an inadequate volume of blood received in culture bottles   Culture   Final    NO GROWTH 2 DAYS Performed at [facility], [address] [place], [place] [address]    Report Status PENDING  Incomplete  Culture, Respiratory w Gram Stain     Status: None (Preliminary result)   Collection Time: 12/28/23 11:22 PM   Specimen: Tracheal Aspirate; Respiratory  Result Value Ref Range Status   Specimen Description   Final    TRACHEAL ASPIRATE Performed at [facility], [address] [place], [place] [address]    Special Requests   Final    NONE Performed at [facility], [address] [place], [place] [address]    Gram Stain   Final    FEW WBC PRESENT, PREDOMINANTLY PMN FEW GRAM POSITIVE COCCI IN PAIRS FEW GRAM VARIABLE ROD Performed at [facility], [address], [place], [place] [address]    Culture PENDING  Incomplete   Report Status PENDING  Incomplete    Coagulation Studies: Recent Labs    12/27/23 1202  LABPROT 25.0*  INR 2.2*    Urinalysis: Recent Labs    12/28/23 0024  COLORURINE AMBER*  LABSPEC 1.013  PHURINE 5.0  GLUCOSEU NEGATIVE  HGBUR LARGE*  BILIRUBINUR NEGATIVE   KETONESUR NEGATIVE  PROTEINUR 100*  NITRITE NEGATIVE  LEUKOCYTESUR NEGATIVE      Imaging: ECHOCARDIOGRAM COMPLETE Result Date: 12/28/2023    ECHOCARDIOGRAM REPORT   Patient Name:   Daniel Carr Date of Exam: 12/28/2023 Medical Rec #:  [id number]     Height:       73.0 in Accession #:    [id number]    Weight:       238.1 lb Date of Birth:  11-Sep-1935      BSA:          2.317 m Patient Age:    88 years      BP:           134/72 mmHg Patient Gender: M             HR:           84 bpm. Exam Location:  [facility] Procedure: 2D Echo, Cardiac Doppler and Color Doppler Indications:     Bacteremia  History:         Patient has prior history of Echocardiogram examinations, most                  recent 08/20/2020. Aortic Valve Disease, Signs/Symptoms:Edema;                  Risk Factors:Former Smoker.  Sonographer:     Tillman Nora RVT RCS Referring Phys:  [id number] INGE JONETTA LECHER Diagnosing Phys: Redell Cave MD  Sonographer Comments: No apical window, suboptimal subcostal window, suboptimal apical window, echo performed with patient supine and on artificial respirator and no subcostal window. No acoustic windows, from any scanning plane. Aortic valve meaurements  are somewhat consistent but their accuracy is questionable IMPRESSIONS  1. Left ventricular ejection fraction, by estimation, is 60 to 65%. Left ventricular ejection fraction by PLAX is 61 %. The left ventricle has normal function. The left ventricle has no regional wall motion abnormalities. Left ventricular diastolic parameters are indeterminate.  2. Right ventricular systolic function is normal. The right  ventricular size is not well visualized.  3. Left atrial size was mildly dilated.  4. The mitral valve is degenerative. Mild mitral valve regurgitation.  5. The aortic valve is calcified. Aortic valve regurgitation is mild. Moderate aortic valve stenosis. Aortic valve mean gradient measures 20.0 mmHg. FINDINGS  Left Ventricle: Left ventricular ejection  fraction, by estimation, is 60 to 65%. Left ventricular ejection fraction by PLAX is 61 %. The left ventricle has normal function. The left ventricle has no regional wall motion abnormalities. The left ventricular internal cavity size was normal in size. There is no left ventricular hypertrophy. Left ventricular diastolic parameters are indeterminate. Right Ventricle: The right ventricular size is not well visualized. No increase in right ventricular wall thickness. Right ventricular systolic function is normal. Left Atrium: Left atrial size was mildly dilated. Right Atrium: Right atrial size was normal in size. Pericardium: There is no evidence of pericardial effusion. Mitral Valve: The mitral valve is degenerative in appearance. Mild to moderate mitral annular calcification. Mild mitral valve regurgitation. Tricuspid Valve: The tricuspid valve is normal in structure. Tricuspid valve regurgitation is mild. Aortic Valve: The aortic valve is calcified. Aortic valve regurgitation is mild. Moderate aortic stenosis is present. Aortic valve mean gradient measures 20.0 mmHg. Aortic valve peak gradient measures 35.5 mmHg. Pulmonic Valve: The pulmonic valve was normal in structure. Pulmonic valve regurgitation is trivial. Aorta: The aortic root is normal in size and structure. Venous: The inferior vena cava was not well visualized. IAS/Shunts: No atrial level shunt detected by color flow Doppler.  LEFT VENTRICLE PLAX 2D LV EF:         Left ventricular ejection fraction by PLAX is 61 %. LVIDd:         4.30 cm LVIDs:         2.90 cm LV PW:         1.00 cm LV IVS:        0.90 cm LVOT diam:     2.00 cm LVOT Area:     3.14 cm  LEFT ATRIUM         Index LA diam:    3.20 cm 1.38 cm/m  AORTIC VALVE               PULMONIC VALVE AV Vmax:      298.00 cm/s  PV Vmax:       1.01 m/s AV Vmean:     208.000 cm/s PV Peak grad:  4.1 mmHg AV VTI:       0.488 m AV Peak Grad: 35.5 mmHg AV Mean Grad: 20.0 mmHg  AORTA Ao Root diam: 3.30 cm   SHUNTS Systemic Diam: 2.00 cm Redell Cave MD Electronically signed by Redell Cave MD Signature Date/Time: 12/28/2023/5:29:18 PM    Final    US  RENAL Result Date: 12/28/2023 CLINICAL DATA:  Acute kidney injury EXAM: RENAL / URINARY TRACT ULTRASOUND COMPLETE COMPARISON:  04/15/2022 FINDINGS: Right Kidney: Renal measurements: 11.4 x 5.8 x 5.8 cm = volume: 198 mL. Echogenicity within normal limits. No mass or hydronephrosis visualized. Left Kidney: Renal measurements: 10.5 x 6.6 x 6.2 cm = volume: 226 mL. Echogenicity within normal limits. No mass or hydronephrosis visualized. Bladder: Partially collapsed around Foley catheter balloon. Other: None. IMPRESSION: No significant sonographic abnormality of the kidneys. Electronically Signed   By: Aliene Lloyd M.D.   On: 12/28/2023 11:32   US  Venous Img Upper Uni Right(DVT) Result Date: 12/28/2023 CLINICAL DATA:  Right humeral fracture now with pain and edema. Evaluate for  DVT. EXAM: RIGHT UPPER EXTREMITY VENOUS DOPPLER ULTRASOUND TECHNIQUE: Gray-scale sonography with graded compression, as well as color Doppler and duplex ultrasound were performed to evaluate the upper extremity deep venous system from the level of the subclavian vein and including the jugular, axillary, basilic, radial, ulnar and upper cephalic vein. Spectral Doppler was utilized to evaluate flow at rest and with distal augmentation maneuvers. COMPARISON:  None Available. FINDINGS: Contralateral Subclavian Vein: Respiratory phasicity is normal and symmetric with the symptomatic side. No evidence of thrombus. Normal compressibility. Internal Jugular Vein: No evidence of thrombus. Normal compressibility, respiratory phasicity and response to augmentation. Subclavian Vein: No evidence of thrombus. Normal compressibility, respiratory phasicity and response to augmentation. Axillary Vein: No evidence of thrombus. Normal compressibility, respiratory phasicity and response to augmentation. Cephalic  Vein: No evidence of thrombus. Normal compressibility, respiratory phasicity and response to augmentation. Basilic Vein: No evidence of thrombus. Normal compressibility, respiratory phasicity and response to augmentation. Brachial Veins: No evidence of thrombus. Normal compressibility, respiratory phasicity and response to augmentation. Radial Veins: No evidence of thrombus. Normal compressibility, respiratory phasicity and response to augmentation. Ulnar Veins: No evidence of thrombus. Normal compressibility, respiratory phasicity and response to augmentation. Other Findings: There is a minimal amount of subcutaneous edema at the level of the right forearm. IMPRESSION: No evidence of DVT within the right upper extremity. Electronically Signed   By: Norleen Roulette M.D.   On: 12/28/2023 08:48   DG Chest Port 1 View Result Date: 12/27/2023 CLINICAL DATA:  Check central line placement EXAM: PORTABLE CHEST 1 VIEW COMPARISON:  Film from earlier in the same day. FINDINGS: The tube and gastric catheter are noted and stable. Temporary dialysis catheter via the left jugular approach is noted with the tip in proximal superior vena cava. No pneumothorax is noted. Skin fold is noted over the left apex. No focal infiltrate is seen. IMPRESSION: No pneumothorax following central line placement. Electronically Signed   By: Oneil Devonshire M.D.   On: 12/27/2023 23:08   US  Venous Img Lower Bilateral (DVT) Result Date: 12/27/2023 CLINICAL DATA:  Bilateral lower extremity edema. Recent fall. History of pulmonary embolism. Evaluate for DVT. EXAM: BILATERAL LOWER EXTREMITY VENOUS DOPPLER ULTRASOUND TECHNIQUE: Gray-scale sonography with graded compression, as well as color Doppler and duplex ultrasound were performed to evaluate the lower extremity deep venous systems from the level of the common femoral vein and including the common femoral, femoral, profunda femoral, popliteal and calf veins including the posterior tibial, peroneal  and gastrocnemius veins when visible. The superficial great saphenous vein was also interrogated. Spectral Doppler was utilized to evaluate flow at rest and with distal augmentation maneuvers in the common femoral, femoral and popliteal veins. COMPARISON:  None Available. FINDINGS: RIGHT LOWER EXTREMITY Common Femoral Vein: There is nonocclusive wall thickening/chronic DVT involving the right common femoral vein (image 2). Saphenofemoral Junction: There is nonocclusive wall thickening involving the saphenofemoral junction (image 7). Profunda Femoral Vein: Appears patent where visualized. Femoral Vein: There is nonocclusive wall thickening/chronic DVT involving the proximal (image 15), mid (images 16 and 17) and distal (images 19 and 20) aspects of the femoral vein. Popliteal Vein: There is nonocclusive wall thickening/chronic DVT involving the right popliteal vein (images 22 and 23, images 25 and 26. Calf Veins: Appear patent where visualized. Superficial Great Saphenous Vein: No evidence of thrombus. Normal compressibility. Other Findings:  None. LEFT LOWER EXTREMITY Common Femoral Vein: There is nonocclusive wall thickening/chronic DVT involving the left common femoral vein (images 34 and 36). Saphenofemoral Junction: There  is nonocclusive wall thickening/chronic DVT involving the saphenofemoral junction (image 39). Profunda Femoral Vein: Appears patent where visualized. Femoral Vein: There is nonocclusive wall thickening involving the proximal (images 45 and 46), mid (images 48 and 49 and distal (image 52) aspects of the left femoral vein Popliteal Vein: There is nonocclusive wall thickening involving the left popliteal vein (image 55). Calf Veins: Appear patent where visualized. Superficial Great Saphenous Vein: No evidence of thrombus. Normal compressibility. Other Findings:  None. IMPRESSION: The examination is positive for extensive bilateral non occlusive wall thickening/chronic DVT extending from the level  of the bilateral common femoral through the bilateral popliteal veins. No evidence of acute or occlusive DVT within either lower extremity. Electronically Signed   By: Norleen Roulette M.D.   On: 12/27/2023 16:33     Medications:    albumin  human Stopped (12/29/23 1102)   ampicillin  (OMNIPEN) IV Stopped (12/29/23 1228)   feeding supplement (PIVOT 1.5 CAL)     fentaNYL  infusion INTRAVENOUS 100 mcg/hr (12/29/23 1500)   norepinephrine  (LEVOPHED ) Adult infusion 1 mcg/min (12/29/23 1500)   PrismaSol  BGK 2/3.5 400 mL/hr at 12/29/23 1355   PrismaSol  BGK 2/3.5 400 mL/hr at 12/29/23 1353   PrismaSol  BGK 2/3.5 1,000 mL/hr at 12/29/23 1119   propofol  (DIPRIVAN ) infusion 35 mcg/kg/min (12/29/23 1455)    Chlorhexidine  Gluconate Cloth  6 each Topical Daily   docusate  100 mg Per Tube BID   famotidine   20 mg Per Tube Daily   [START ON 12/30/2023] feeding supplement (PROSource TF20)  60 mL Per Tube BID   free water   30 mL Per Tube Q6H   heparin  injection (subcutaneous)  5,000 Units Subcutaneous Q8H   ipratropium-albuterol   3 mL Nebulization Q6H   [START ON 12/30/2023] methylPREDNISolone  (SOLU-MEDROL ) injection  40 mg Intravenous Daily   multivitamin  1 tablet Per Tube QHS   [START ON 12/30/2023] nutrition supplement (JUVEN)  1 packet Per Tube BID BM   mouth rinse  15 mL Mouth Rinse Q2H   polyethylene glycol  17 g Per Tube Daily   docusate, fentaNYL , fentaNYL  (SUBLIMAZE ) injection, heparin , ipratropium-albuterol , mouth rinse, polyethylene glycol, sodium chloride   Assessment/ Plan:  89 y.o. male with past medical history of hypertension, hypothyroidism, neuropathy, COPD, MGUS, history of PE on apixaban , diastolic heart failure, hyperlipidemia who was admitted on 12/27/2023 for acute hypoxic respiratory failure.  1.  Acute kidney injury/acute metabolic acidosis/hyperkalemia/hyponatremia.  Baseline creatinine 0.9 on 12/13/2023.  Urine output does appear to be improving.  Maintain the patient on CRRT.  UF of 100  cc/h added.  2.  Acute respiratory failure.  Continue ventilatory support at this time.  3.  Hypotension secondary to systemic shock from sepsis and volume depletion.  Patient maintained on Levophed  drip at the moment.   LOS: 2 Pavel Gadd 1/2/20253:35 PM

## 2023-12-29 NOTE — Plan of Care (Signed)

## 2023-12-29 NOTE — Progress Notes (Signed)
 Nutrition Follow-up  DOCUMENTATION CODES:   Not applicable  INTERVENTION:   Pivot 1.5@50ml /hr- Initiate at 34ml/hr and increase by 10ml/hr q8 hours until goal rate is reached.   ProSource TF 20- Give 60ml BID via tube, each supplement provides 80kcal and 20g of protein.   Free water  flushes 30ml q6 hours to maintain tube patency   Regimen provides 1960kcal/day, 152g/day protein and 1036ml/day of free water .   Pt at high refeed risk; recommend monitor potassium, magnesium  and phosphorus labs daily until stable  Rena-vit daily via tube   Juven Fruit Punch BID via tube, each serving provides 95kcal and 2.5g of protein (amino acids glutamine and arginine)  Daily weights   NUTRITION DIAGNOSIS:   Inadequate oral intake related to inability to eat (pt sedated and ventilated) as evidenced by NPO status.  GOAL:   Provide needs based on ASPEN/SCCM guidelines  MONITOR:   Vent status, Labs, Weight trends, TF tolerance, I & O's, Skin  REASON FOR ASSESSMENT:   Consult, Ventilator Assessment of nutrition requirement/status  ASSESSMENT:   89 y.o. male with h/o HLD, HTN, hypothyroidism, COPD, glaucoma, BPH, MGUS, PE (2022), CHF, aortic stenosis and recent humerus fracture s/p mechanical fall (12/3) who is admitted with PNA, sepsis, AKI, anemia and shock.  Pt sedated and ventilated. OGT in place. Will plan to initiate tube feeds today. Pt is at high refeed risk. Family member at bedside reports pt with fairly good oral intake pta but reports that pt is not a big eater at baseline. Pt does drink Ensure sometimes at home. Per chart, pt appears to be up ~29lbs from his UBW of 205lbs. Pt is noted to have anasarca. CRRT initiated yesterday.   Medications reviewed and include: colace, pepcid , heparin , solu-medrol , rena-vit, miralax , albumin , ampicillin , levophed , propofol    Labs reviewed: Na 130(L), K 3.9 wnl, P 3.0 wnl, Mg 2.1 wnl Wbc- 12.1(H), Hgb 8.1(L), Hct 23.0(L) Cbgs- 126, 115,  134 x 24 hrs  Patient is currently intubated on ventilator support MV: 8.0 L/min Temp (24hrs), Avg:98.3 F (36.8 C), Min:97.9 F (36.6 C), Max:98.7 F (37.1 C)  Propofol : 22.66ml/hr- provides 596kcal/day   MAP- >79mmHg   UOP-   NUTRITION - FOCUSED PHYSICAL EXAM:  Flowsheet Row Most Recent Value  Orbital Region No depletion  Upper Arm Region Unable to assess  Thoracic and Lumbar Region No depletion  Buccal Region No depletion  Temple Region No depletion  Clavicle Bone Region Moderate depletion  Clavicle and Acromion Bone Region Moderate depletion  Scapular Bone Region Mild depletion  Dorsal Hand Unable to assess  Patellar Region Moderate depletion  Anterior Thigh Region Moderate depletion  Posterior Calf Region Unable to assess  Edema (RD Assessment) Severe  Hair Reviewed  Eyes Reviewed  Mouth Reviewed  Skin Reviewed  Nails Reviewed   Diet Order:   Diet Order             Diet NPO time specified  Diet effective now                  EDUCATION NEEDS:   No education needs have been identified at this time  Skin:  Skin Assessment: Reviewed RN Assessment (wound R buttocks)  Last BM:  12/27/23  Height:   Ht Readings from Last 1 Encounters:  12/27/23 6' 1 (1.854 m)    Weight:   Wt Readings from Last 1 Encounters:  12/29/23 106.6 kg    Ideal Body Weight:  83.6 kg  BMI:  Body mass index is  31.01 kg/m.  Estimated Nutritional Needs:   Kcal:  1851kcal/day  Protein:  140-155g/day  Fluid:  UOP +1L  Augustin Shams MS, RD, LDN If unable to be reached, please send secure chat to RD inpatient available from 8:00a-4:00p daily

## 2023-12-29 NOTE — Consult Note (Signed)
 PHARMACY CONSULT NOTE - ELECTROLYTES  Pharmacy Consult for Electrolyte Monitoring and Replacement   Recent Labs: Potassium (mmol/L)  Date Value  12/29/2023 3.9   Magnesium  (mg/dL)  Date Value  98/97/7974 2.1   Calcium (mg/dL)  Date Value  98/97/7974 8.1 (L)   Albumin  (g/dL)  Date Value  98/97/7974 1.9 (L)  09/28/2023 4.0   Phosphorus (mg/dL)  Date Value  98/97/7974 3.0   Sodium (mmol/L)  Date Value  12/29/2023 130 (L)  09/28/2023 132 (L)   Height: 6' 1 (185.4 cm) Weight: 106.6 kg (235 lb 0.2 oz) IBW/kg (Calculated) : 79.9 Estimated Creatinine Clearance: 33.5 mL/min (A) (by C-G formula based on SCr of 2.03 mg/dL (H)).  Assessment  Daniel Carr is a 89 y.o. male presenting with acute on chronic respiratory failure. PMH significant for HTN, hypothyroidism, HFpEF, COPD, aortic stenosis, Hx PE / CTEPH. Pharmacy has been consulted to monitor and replace electrolytes.  Diet: NPO MIVF: N/A Pertinent medications: N/A  Goal of Therapy: Electrolytes within normal limits  Plan:  --No replacement currently indicated --Patient is now on CRRT. Twice daily renal function panel labs ordered  Thank you for allowing pharmacy to be a part of this patient's care.  Daniel Carr 12/29/2023 7:38 AM

## 2023-12-30 ENCOUNTER — Inpatient Hospital Stay: Payer: Medicare HMO

## 2023-12-30 DIAGNOSIS — G9341 Metabolic encephalopathy: Secondary | ICD-10-CM | POA: Diagnosis not present

## 2023-12-30 DIAGNOSIS — R7881 Bacteremia: Secondary | ICD-10-CM | POA: Diagnosis not present

## 2023-12-30 DIAGNOSIS — B952 Enterococcus as the cause of diseases classified elsewhere: Secondary | ICD-10-CM | POA: Diagnosis not present

## 2023-12-30 LAB — CBC
HCT: 23.2 % — ABNORMAL LOW (ref 39.0–52.0)
Hemoglobin: 8 g/dL — ABNORMAL LOW (ref 13.0–17.0)
MCH: 30.9 pg (ref 26.0–34.0)
MCHC: 34.5 g/dL (ref 30.0–36.0)
MCV: 89.6 fL (ref 80.0–100.0)
Platelets: 179 10*3/uL (ref 150–400)
RBC: 2.59 MIL/uL — ABNORMAL LOW (ref 4.22–5.81)
RDW: 14.7 % (ref 11.5–15.5)
WBC: 15.1 10*3/uL — ABNORMAL HIGH (ref 4.0–10.5)
nRBC: 0.3 % — ABNORMAL HIGH (ref 0.0–0.2)

## 2023-12-30 LAB — RENAL FUNCTION PANEL
Albumin: 2.2 g/dL — ABNORMAL LOW (ref 3.5–5.0)
Albumin: 2.4 g/dL — ABNORMAL LOW (ref 3.5–5.0)
Anion gap: 7 (ref 5–15)
Anion gap: 9 (ref 5–15)
BUN: 40 mg/dL — ABNORMAL HIGH (ref 8–23)
BUN: 44 mg/dL — ABNORMAL HIGH (ref 8–23)
CO2: 26 mmol/L (ref 22–32)
CO2: 23 mmol/L (ref 22–32)
Calcium: 9 mg/dL (ref 8.9–10.3)
Calcium: 9.1 mg/dL (ref 8.9–10.3)
Chloride: 102 mmol/L (ref 98–111)
Chloride: 100 mmol/L (ref 98–111)
Creatinine, Ser: 1.62 mg/dL — ABNORMAL HIGH (ref 0.61–1.24)
Creatinine, Ser: 1.64 mg/dL — ABNORMAL HIGH (ref 0.61–1.24)
GFR, Estimated: 41 mL/min — ABNORMAL LOW (ref >60)
GFR, Estimated: 40 mL/min — ABNORMAL LOW (ref >60)
Glucose, Bld: 134 mg/dL — ABNORMAL HIGH (ref 70–99)
Glucose, Bld: 140 mg/dL — ABNORMAL HIGH (ref 70–99)
Phosphorus: 2.9 mg/dL (ref 2.5–4.6)
Phosphorus: 2.7 mg/dL (ref 2.5–4.6)
Potassium: 3.8 mmol/L (ref 3.5–5.1)
Potassium: 4.1 mmol/L (ref 3.5–5.1)
Sodium: 135 mmol/L (ref 135–145)
Sodium: 132 mmol/L — ABNORMAL LOW (ref 135–145)

## 2023-12-30 LAB — GLUCOSE, CAPILLARY
Glucose-Capillary: 123 mg/dL — ABNORMAL HIGH (ref 70–99)
Glucose-Capillary: 127 mg/dL — ABNORMAL HIGH (ref 70–99)
Glucose-Capillary: 136 mg/dL — ABNORMAL HIGH (ref 70–99)
Glucose-Capillary: 138 mg/dL — ABNORMAL HIGH (ref 70–99)
Glucose-Capillary: 138 mg/dL — ABNORMAL HIGH (ref 70–99)
Glucose-Capillary: 143 mg/dL — ABNORMAL HIGH (ref 70–99)

## 2023-12-30 LAB — CULTURE, BLOOD (ROUTINE X 2): Special Requests: ADEQUATE

## 2023-12-30 LAB — MAGNESIUM: Magnesium: 2.1 mg/dL (ref 1.7–2.4)

## 2023-12-30 NOTE — Plan of Care (Signed)
  Problem: Education: Goal: Knowledge of General Education information will improve Description: Including pain rating scale, medication(s)/side effects and non-pharmacologic comfort measures Outcome: Not Met (add Reason)   Problem: Health Behavior/Discharge Planning: Goal: Ability to manage health-related needs will improve Outcome: Progressing   Problem: Clinical Measurements: Goal: Ability to maintain clinical measurements within normal limits will improve Outcome: Progressing Goal: Will remain free from infection Outcome: Progressing Goal: Diagnostic test results will improve Outcome: Progressing Goal: Respiratory complications will improve Outcome: Progressing Goal: Cardiovascular complication will be avoided Outcome: Progressing   Problem: Activity: Goal: Risk for activity intolerance will decrease Outcome: Not Met (add Reason)   Problem: Nutrition: Goal: Adequate nutrition will be maintained Outcome: Progressing   Problem: Coping: Goal: Level of anxiety will decrease Outcome: Progressing   Problem: Elimination: Goal: Will not experience complications related to bowel motility Outcome: Progressing Goal: Will not experience complications related to urinary retention Outcome: Progressing   Problem: Pain Management: Goal: General experience of comfort will improve Outcome: Progressing   Problem: Safety: Goal: Ability to remain free from injury will improve Outcome: Progressing   Problem: Skin Integrity: Goal: Risk for impaired skin integrity will decrease Outcome: Progressing

## 2023-12-30 NOTE — Progress Notes (Signed)
 Date of Admission:  12/27/2023      ID: Daniel Carr is a 89 y.o. male  Principal Problem:   Acute hypoxic respiratory failure (HCC)  89 y.o. with a history of COPD, left hemidiaphragm paralysis, aortic stenosis , HTN, Hypothyroidism, h/o PE , b/l TKA Recent hospitalization at [facility] 12/6-12/17 with progressive dyspnea and found to have Severe Pulmonary HTN, chronic co2 retention, CTEPH on eliquis , fracture rt humerus from mechanical fall on 12/3 ( managed non surgical), chronic hyponatremia, urinary retention and foley was placed draining of urine  , started remeron  for appetite and mood and discharged to [facility] on 12/13/23   Presented to [facility] ED on 12/27/23 from [facility] with altered mental status and decreased urine output, inspite of lasix  for severe leg edema. HE was also reported to have chills and fever as per granddaughter  Subjective: Remains intubated Grand daughter at bed side  Medications:   Chlorhexidine  Gluconate Cloth  6 each Topical Daily   docusate  100 mg Per Tube BID   famotidine   20 mg Per Tube Daily   feeding supplement (PROSource TF20)  60 mL Per Tube BID   free water   30 mL Per Tube Q6H   heparin  injection (subcutaneous)  5,000 Units Subcutaneous Q8H   ipratropium-albuterol   3 mL Nebulization Q6H   multivitamin  1 tablet Per Tube QHS   nutrition supplement (JUVEN)  1 packet Per Tube BID BM   mouth rinse  15 mL Mouth Rinse Q2H   polyethylene glycol  17 g Per Tube Daily    Objective: Vital signs in last 24 hours: Patient Vitals for the past 24 hrs:  BP Temp Temp src Pulse Resp SpO2 Height Weight  12/30/23 1430 -- -- -- 94 (!) 38 92 % -- --  12/30/23 1420 -- -- -- -- -- 93 % -- --  12/30/23 1415 -- -- -- 90 (!) 26 97 % -- --  12/30/23 1400 115/61 -- -- 92 (!) 30 99 % -- --  12/30/23 1345 -- -- -- 88 (!) 28 100 % -- --  12/30/23 1330 -- -- -- 88 (!) 23 99 % -- --  12/30/23 1315 -- -- -- 89 (!) 25 99 % -- --  12/30/23 1300 97/64 -- -- 89 (!) 22  99 % -- --  12/30/23 1245 -- -- -- 89 (!) 23 98 % -- --  12/30/23 1230 -- -- -- 90 (!) 22 97 % -- --  12/30/23 1215 -- -- -- 89 (!) 33 98 % -- --  12/30/23 1200 99/65 98.1 F (36.7 C) Axillary 80 (!) 26 99 % -- --  12/30/23 1145 -- -- -- 81 (!) 23 97 % -- --  12/30/23 1130 -- -- -- 80 (!) 22 100 % -- --  12/30/23 1115 -- -- -- 83 (!) 24 100 % -- --  12/30/23 1100 99/63 -- -- 85 (!) 22 100 % -- --  12/30/23 1045 -- -- -- 83 (!) 22 100 % -- --  12/30/23 1030 -- -- -- 74 (!) 22 99 % -- --  12/30/23 1015 -- -- -- 74 (!) 22 98 % -- --  12/30/23 1000 109/68 98 F (36.7 C) Axillary 71 (!) 21 95 % -- --  12/30/23 0945 -- -- -- 72 (!) 22 97 % -- --  12/30/23 0930 -- -- -- 75 (!) 22 95 % -- --  12/30/23 0915 -- -- -- 83 (!) 26 (!) 77 % -- --  12/30/23 0900 (!) 94/48 -- -- 81 (!) 23 97 % -- --  12/30/23 0845 -- 98 F (36.7 C) Axillary 70 (!) 22 99 % -- --  12/30/23 0830 -- -- -- 76 (!) 22 100 % -- --  12/30/23 0815 -- -- -- 83 (!) 22 100 % -- --  12/30/23 0800 (!) 95/55 -- -- 69 (!) 22 100 % -- --  12/30/23 0745 -- 98.1 F (36.7 C) Axillary 66 (!) 22 100 % -- --  12/30/23 0730 -- -- -- 68 (!) 22 100 % -- --  12/30/23 0715 -- -- -- 67 (!) 22 100 % -- --  12/30/23 0700 (!) 87/50 -- -- 68 (!) 22 100 % -- --  12/30/23 0645 -- -- -- 68 (!) 22 100 % -- --  12/30/23 0630 -- -- -- 64 (!) 22 100 % -- --  12/30/23 0615 -- -- -- 67 (!) 22 100 % -- --  12/30/23 0600 121/60 -- -- 62 (!) 22 100 % -- --  12/30/23 0510 118/62 -- -- 68 (!) 22 100 % -- --  12/30/23 0500 -- -- -- 67 (!) 22 100 % -- --  12/30/23 0416 -- -- -- -- -- -- -- 106.4 kg  12/30/23 0400 106/88 98.1 F (36.7 C) Axillary 71 (!) 22 100 % -- --  12/30/23 0328 -- -- -- -- -- 100 % -- --  12/30/23 0325 -- -- -- -- -- 100 % -- --  12/30/23 0300 (!) 144/73 -- -- 64 (!) 22 100 % -- --  12/30/23 0200 (!) 100/58 -- -- 65 (!) 22 100 % -- --  12/30/23 0100 (!) 113/56 -- -- 70 (!) 22 100 % -- --  12/30/23 0030 -- -- -- 73 (!) 22 100 % -- --   12/30/23 0000 (!) 144/76 98.7 F (37.1 C) Axillary 71 (!) 22 100 % -- --  12/29/23 2310 -- -- -- -- -- 100 % -- --  12/29/23 2300 124/64 -- -- 72 (!) 22 100 % -- --  12/29/23 2200 (!) 110/57 -- -- 72 (!) 22 100 % -- --  12/29/23 2100 110/61 -- -- 70 (!) 22 100 % -- --  12/29/23 2000 134/64 98.1 F (36.7 C) Axillary 69 (!) 22 100 % -- --  12/29/23 1933 -- -- -- 70 (!) 22 100 % 6' 0.99 (1.854 m) --  12/29/23 1900 (!) 93/56 -- -- 80 (!) 22 100 % -- --  12/29/23 1800 (!) 103/55 -- -- 69 (!) 22 100 % -- --  12/29/23 1700 128/65 -- -- 64 (!) 22 100 % -- --  12/29/23 1600 (!) 95/55 98.2 F (36.8 C) Axillary 70 20 100 % -- --  12/29/23 1500 -- -- -- 73 (!) 22 100 % -- --      PHYSICAL EXAM:  General: intubated Lungs: b/l air entry Heart: Regular rate and rhythm, no murmur, rub or gallop. Abdomen: Soft, non-tender,not distended. Bowel sounds normal. No masses Extremities: atraumatic, no cyanosis. No edema. No clubbing Skin: No rashes or lesions. Or bruising Lymph: Cervical, supraclavicular normal. Neurologic: Grossly non-focal  Lab Results    Latest Ref Rng & Units 12/30/2023    4:08 AM 12/29/2023    5:01 AM 12/28/2023    4:27 AM  CBC  WBC 4.0 - 10.5 K/uL 15.1  12.1  13.0   Hemoglobin 13.0 - 17.0 g/dL 8.0  8.1  9.2   Hematocrit 39.0 - 52.0 % 23.2  23.0  26.5   Platelets 150 - 400 K/uL 179  150  152        Latest Ref Rng & Units 12/30/2023    4:08 AM 12/29/2023    5:02 PM 12/29/2023    5:01 AM  CMP  Glucose 70 - 99 mg/dL 865  880  875   BUN 8 - 23 mg/dL 40  41  46   Creatinine 0.61 - 1.24 mg/dL 8.37  8.07  7.96   Sodium 135 - 145 mmol/L 135  131  130   Potassium 3.5 - 5.1 mmol/L 3.8  3.9  3.9   Chloride 98 - 111 mmol/L 102  96  98   CO2 22 - 32 mmol/L 26  23  21    Calcium 8.9 - 10.3 mg/dL 9.0  8.8  8.1       Microbiology:  Studies/Results: DG Chest Port 1 View Result Date: 12/30/2023 CLINICAL DATA:  Lung infiltrate. EXAM: PORTABLE CHEST 1 VIEW COMPARISON:  X-ray 12/27/2023.  older exams as well FINDINGS: Stable ET tube, enteric tube left IJ double-lumen catheter. Rotated radiograph to the right with some kyphosis obscuring the right lung apex. Stable elevation of the right hemidiaphragm. Persistent right lung base opacity with a point of effusion. No edema or pneumothorax. Overlapping cardiac leads. IMPRESSION: No significant interval change when adjusting for technique. Electronically Signed   By: Ranell Bring M.D.   On: 12/30/2023 13:05     Assessment/Plan: Acute metabolic encephalopathy   Enterococcus fecalis bacteremia with septic shock- source is complicated UTI with indwelling foley Urine culture positive for enterococcus On ampicillin  2 d echo Will not need TEE as risk for endocarditis is very low   Acute Hypercapnic c resp failure- mechanically ventilated COPD exacerbation   CTEPH   AKI secondary to ATN/   Septic shock with lactic acidosis   Urinary retention- has foley   Recent rt humeral fracture- managed conservatively   Hyponatremia ? Anemia?  Discussed the management with his Granddaughter and intensivist  ID will follow him peripherally this weekend- on call IDP available by phone for urgent issue- call if needed

## 2023-12-30 NOTE — Consult Note (Signed)
 PHARMACY CONSULT NOTE - ELECTROLYTES  Pharmacy Consult for Electrolyte Monitoring and Replacement   Recent Labs: Potassium (mmol/L)  Date Value  12/30/2023 4.1   Magnesium  (mg/dL)  Date Value  98/96/7974 2.1   Calcium (mg/dL)  Date Value  98/96/7974 9.1   Albumin  (g/dL)  Date Value  98/96/7974 2.4 (L)  09/28/2023 4.0   Phosphorus (mg/dL)  Date Value  98/96/7974 2.7   Sodium (mmol/L)  Date Value  12/30/2023 132 (L)  09/28/2023 132 (L)   Height: 6' 0.99 (185.4 cm) Weight: 106.4 kg (234 lb 9.1 oz) IBW/kg (Calculated) : 79.88 Estimated Creatinine Clearance: 41.4 mL/min (A) (by C-G formula based on SCr of 1.64 mg/dL (H)).  Assessment  Daniel Carr is a 89 y.o. male presenting with acute on chronic respiratory failure. PMH significant for HTN, hypothyroidism, HFpEF, COPD, aortic stenosis, Hx PE / CTEPH. Pharmacy has been consulted to monitor and replace electrolytes. Patient initiated on CRRT on 12/28/2023   Diet: NPO MIVF: N/A Pertinent medications: N/A  Goal of Therapy: Electrolytes within normal limits  Plan:  --No replacement currently indicated --Patient is now on CRRT. Twice daily renal function panel labs ordered  Thank you for allowing pharmacy to be a part of this patient's care.  Grete Bosko A 12/30/2023 4:45 PM

## 2023-12-30 NOTE — Plan of Care (Signed)
   Problem: Clinical Measurements: Goal: Ability to maintain clinical measurements within normal limits will improve Outcome: Progressing Goal: Will remain free from infection Outcome: Progressing Goal: Diagnostic test results will improve Outcome: Progressing Goal: Respiratory complications will improve Outcome: Progressing Goal: Cardiovascular complication will be avoided Outcome: Progressing   Problem: Nutrition: Goal: Adequate nutrition will be maintained Outcome: Progressing   Problem: Safety: Goal: Ability to remain free from injury will improve Outcome: Progressing   Problem: Skin Integrity: Goal: Risk for impaired skin integrity will decrease Outcome: Progressing

## 2023-12-30 NOTE — Progress Notes (Signed)
 NAME:  Daniel Carr, MRN:  [id number], DOB:  January 11, 1935, LOS: 3 ADMISSION DATE:  12/27/2023, CONSULTATION DATE: 12/27/2023 REFERRING MD: Dr. Dicky, CHIEF COMPLAINT: Shortness of Breath    History of Present Illness:  This is an 89 yo male who presented to [facility] ER via EMS on 12/31 from [facility] with altered mental status and acute respiratory failure.  Pts family reports he has had worsening bilateral lower extremity edema and decreased urinary output.  His PCP increased his outpatient lasix  dose to 40 mg daily, however his urinary output did not improve.  Pts granddaughter reports he also c/o chills and fevers.  He was recently hospitalized at [facility] on 12/02/23 with a right humerus fracture from a mechanical fall on 12/3 and evaluated by ortho who recommended a sling for comfort and outpatient follow-up.  However, during presentation he was found to have progressive dyspnea; severe pulmonary hypertension; hyponatremia; acute blood loss anemia suspected secondary to right chest wall hematoma and acute kidney injury on chronic kidney disease requiring hospital admission.  Following treatment he was discharged on 12/17 to [facility] for rehab and instructed not to resume his outpatient apixaban  until 12/18.  His daughter reports the pts apixaban  has been resumed as instructed.    ED Course  Upon arrival to the ER pt in severe respiratory distress, hypotensive, and minimally responsive requiring mechanical intubation.  Significant lab results were: Na+ 125/K+ 5.9/chloride 86/BUN 72/creatinine 4.46/calcium 8.7/albumin  2.5/AST 143/ALT 89/troponin 550/lactic acid 2.4/wbc 15.9/hgb 11.2/PT 25.0/INR 2.2/APTT 48.  CXR concerning for possible small right pleural effusion.  He met sepsis criteria and received: 2L saline bolus; vancomycin , and aztreonam .  Despite iv fluid resuscitation pt remained hypotensive requiring levophed  gtt.  PCCM team contacted for ICU admission.   12/27/22- patient with improved  mentation post CRRT.  Reducing his sedatives on MV today.  Sepsis bacteremia, ID on case.  He is oozing blood from IV sites, will order DIC panel and pathology slide review.   12/29/23- patient following verbal communication this am, remains in anasarca. Bacteremia being treated. He has CRRT with now removal of fluid at 100/hr.  Starting NG feeds.  Tracheal aspirate with GPCs in pairs.  ID on case continues with ampicillin  reducing steroids and off levophed  today. 12/30/23- patient for SBT today.  Last day of steroids today. Failed SBT today.  S/p iD evaluation   Pertinent  Medical History  Arthritis  Glaucoma HTN  Hypothyroidism  HLD Hyponatremia  HFpEF  Right Humerus Fracture secondary to mechanical fall (11/29/2023) Anemia of Chronic Disease  COPD Aortic Stenosis   MGUS PE in 2022 previously on apixaban  but pt self discontinued restarted on 12/14/23 due to CTEPH   Significant Hospital Events: Including procedures, antibiotic start and stop dates in addition to other pertinent events   12/31: Pt admitted with acute hypercapnic respiratory failure, septic shock, AECOPD, pneumonia suspect aspiration, acute kidney injury, acute on chronic hyponatremia, transaminitis, and acute metabolic encephalopathy mechanical intubation    Interim History / Subjective:  Pt sedated with fentanyl  gtt at 25 mcg/hr and mechanically intubated requiring levophed  gtt @4  mcg/min to maintain map 65 or higher.    Objective   Blood pressure (!) 87/50, pulse 68, temperature 98.1 F (36.7 C), temperature source Axillary, resp. rate (!) 22, height 6' 0.99 (1.854 m), weight 106.4 kg, SpO2 100%.    Vent Mode: PRVC FiO2 (%):  [24 %] 24 % Set Rate:  [22 bmp] 22 bmp Vt Set:  [500 mL] 500 mL PEEP:  [  5 cmH20] 5 cmH20 Pressure Support:  [15 cmH20] 15 cmH20 Plateau Pressure:  [19 cmH20-20 cmH20] 19 cmH20   Intake/Output Summary (Last 24 hours) at 12/30/2023 0856 Last data filed at 12/30/2023 0800 Gross per 24 hour   Intake 1484.02 ml  Output 3883 ml  Net -2398.98 ml   Filed Weights   12/28/23 0231 12/29/23 0500 12/30/23 0416  Weight: 108 kg 106.6 kg 106.4 kg   Examination: General: Acute on chronically-ill appearing male, NAD mechanically intubated  HENT: Supple, no JVD present  Lungs: Diminished throughout, even, non labored  Cardiovascular: NSR, s1s2, no r/g, 2+ radial/1+ distal pulses, 3+ bilateral lower extremity pitting edema  Abdomen: +BS x4, obese, soft, non distended  Extremities: Right arm sling present secondary to recent right humerus fracture with 2+ edema  Skin: Intact no rashes/lesions/pressure ulcers present  Neuro: Unresponsive RASS -5, not following commands, bilateral pupils pinpoint, no corneal/gag reflexes present  GU: Outpatient foley exchanged; new indwelling foley draining tea colored urine   Resolved Hospital Problem list     Assessment & Plan:   Acute toxic metabolic encephalopathy  - Avoid sedating medications as able  - Maintain RASS goal 0 to -1 - PAD protocol to maintain RASS goal: propofol  gtt and prn fentanyl   - WUA daily - mental status improved  2. SEPTIC SHOCK- PRESENT ON ADMISSION -Due E fecaelis bacteremia   -ID consult - currently on ampicillin ,   3. Acute on chronic HFpEF  #CTEPH   Hx: HTN, aortic stenosis, PE, and HLD  - Continuous telemetry monitoring  - Trend troponin until peaked  - BNP pending  - Prn levophed  gtt to maintain map 65 or higher  - Heparin  gtt: dosing per pharmacy  - Cooxy pending  - US  Venous right upper extremity and bilateral lower extremities to rule out DVT  - Will consider heart failure team consultation   #Acute hypercapnic respiratory failure  #AECOPD  #Mechanical intubation  - Full vent support for now: vent settings reviewed and established  - Continue lung protective strategies  - Maintain plateau pressures less than 30 cm H2O  - VAP bundle implemented - SBT once all parameters met  - Intermittent CXR's and  ABG's - Scheduled and prn bronchodilator therapy  - IV steroids   #Acute kidney injury suspect secondary to ATN and diuretic therapy  #Hyperkalemia  #Acute on chronic hyponatremia  #Lactic acidosis  - Trend BMP and lactic acid  - Replace electrolytes as indicated  - Strict intake and output  - Urine osmolality/Na+ and serum osmolality pending  - Shifting measures administered for hyperkalemia with repeat BMP pending @2000   #Pneumonia suspect aspiration  - Trend WBC and monitor fever curve - Trend PCT  - Follow cultures  - Continue ampicillin  -tracheal aspirate - GPCs   #Transaminitis  - Trend hepatic function panel  - Avoid hepatotoxic medications   #Anemia of chronic disease  - Trend CBC - Monitor for s/sx for bleeding  - Transfuse for hgb <7  #Previous right humerus fracture  - Venous US  to r/o VTE  - Continue sling per outpatient ortho recommendations  - Follow-up with outpatient orthopedic  #Hypothyroidism  - TSH and free T4 pending   #Endo - CBG's q4hrs  - Follow hypo/hyperglycemic protocol  - Target range 140 to 180  Best Practice (right click and Reselect all SmartList Selections daily)   Diet/type: NPO DVT prophylaxis systemic heparin  Pressure ulcer(s): N/A GI prophylaxis: H2B Lines: N/A Foley:  Yes, and it is still needed:  outpatient foley exchanged 12/27/23 Code Status:  full code Last date of multidisciplinary goals of care discussion [N/A]  Labs   CBC: Recent Labs  Lab 12/27/23 1202 12/28/23 0427 12/29/23 0501 12/30/23 0408  WBC 15.9* 13.0* 12.1* 15.1*  NEUTROABS 12.3*  --   --   --   HGB 11.2* 9.2* 8.1* 8.0*  HCT 36.3* 26.5* 23.0* 23.2*  MCV 101.4* 91.1 89.5 89.6  PLT 151 152 150 179    Basic Metabolic Panel: Recent Labs  Lab 12/27/23 1650 12/27/23 1956 12/28/23 0427 12/28/23 1556 12/29/23 0501 12/29/23 1702 12/30/23 0408  NA  --    < > 127* 130* 130* 131* 135  K  --    < > 4.6 4.5 3.9 3.9 3.8  CL  --    < > 91* 94* 98  96* 102  CO2  --    < > 23 23 21* 23 26  GLUCOSE  --    < > 124* 138* 124* 119* 134*  BUN  --    < > 71* 56* 46* 41* 40*  CREATININE  --    < > 3.62* 2.86* 2.03* 1.92* 1.62*  CALCIUM  --    < > 8.1* 8.4* 8.1* 8.8* 9.0  MG 2.2  --  2.0  --  2.1  --  2.1  PHOS 6.3*  --  3.8 3.7 3.0 3.1 2.9   < > = values in this interval not displayed.   GFR: Estimated Creatinine Clearance: 41.9 mL/min (A) (by C-G formula based on SCr of 1.62 mg/dL (H)). Recent Labs  Lab 12/27/23 1202 12/27/23 1650 12/27/23 1651 12/27/23 1956 12/28/23 0021 12/28/23 0427 12/29/23 0501 12/30/23 0408  PROCALCITON  --  19.58  --   --   --  12.67  --   --   WBC 15.9*  --   --   --   --  13.0* 12.1* 15.1*  LATICACIDVEN 2.4*  --  4.0* 3.6* 2.1*  --   --   --     Liver Function Tests: Recent Labs  Lab 12/27/23 1202 12/28/23 0427 12/28/23 1556 12/29/23 0501 12/29/23 1702 12/30/23 0408  AST 143* 72*  --   --   --   --   ALT 89* 59*  --   --   --   --   ALKPHOS 80 63  --   --   --   --   BILITOT 0.9 0.9  --   --   --   --   PROT 6.8 5.4*  --   --   --   --   ALBUMIN  2.5* 2.1*  2.0* 2.3* 1.9* 2.2* 2.2*   Recent Labs  Lab 12/27/23 1202  LIPASE 25   Recent Labs  Lab 12/27/23 1650 12/28/23 0427  AMMONIA 99* 31    ABG    Component Value Date/Time   PHART 7.51 (H) 12/28/2023 0022   PCO2ART 32 12/28/2023 0022   PO2ART 164 (H) 12/28/2023 0022   HCO3 25.5 12/28/2023 0022   ACIDBASEDEF 3.3 (H) 12/27/2023 1659   O2SAT 99.8 12/28/2023 0022     Coagulation Profile: Recent Labs  Lab 12/27/23 1202  INR 2.2*    Cardiac Enzymes: No results for input(s): CKTOTAL, CKMB, CKMBINDEX, TROPONINI in the last 168 hours.  HbA1C: No results found for: HGBA1C  CBG: Recent Labs  Lab 12/29/23 1639 12/29/23 1944 12/29/23 2325 12/30/23 0401 12/30/23 0739  GLUCAP 119* 128* 129* 123* 143*  Review of Systems:   Unable to assess pt mechanically intubated   Past Medical History:  He,  has a  past medical history of Arthritis, childhood infection, Glaucoma, Hypertension, Hypothyroidism, Sinus problem, and Ulnar neuropathy.   Surgical History:   Past Surgical History:  Procedure Laterality Date   APPENDECTOMY     CATARACT EXTRACTION Bilateral    COLONOSCOPY     JOINT REPLACEMENT Right    KNEE ARTHROPLASTY Left 12/24/2020   Procedure: COMPUTER ASSISTED TOTAL KNEE ARTHROPLASTY;  Surgeon: Mardee Lynwood SQUIBB, MD;  Location: [facility] ORS;  Service: Orthopedics;  Laterality: Left;  3RD CASE   KNEE SURGERY Right 10/04/2011   replacement   NM MYOVIEW  ([facility] HX)  07/21/2017   [facility]. No ischemia.    RETINAL DETACHMENT REPAIR W/ SCLERAL BUCKLE LE     TEE WITHOUT CARDIOVERSION N/A 04/29/2023   Procedure: TRANSESOPHAGEAL ECHOCARDIOGRAM (TEE);  Surgeon: Hilarie Rocher, MD;  Location: [facility] ORS;  Service: Cardiovascular;  Laterality: N/A;   TONSILLECTOMY       Social History:   reports that he quit smoking about 54 years ago. His smoking use included cigarettes. He started smoking about 60 years ago. He has a 1.5 pack-year smoking history. He has never used smokeless tobacco. He reports that he does not drink alcohol  and does not use drugs.   Family History:  His family history includes Hypertension in his father and mother; Stroke in his father and sister.   Allergies No Known Allergies   Home Medications  Prior to Admission medications   Medication Sig Start Date End Date Taking? Authorizing Provider  albuterol  (VENTOLIN  HFA) 108 (90 Base) MCG/ACT inhaler SMARTSIG:2 Puff(s) By Mouth As Directed    [provider]  aspirin  81 MG EC tablet Take 1 tablet (81 mg total) by mouth daily. Swallow whole. 07/06/21   Gasper Nancyann BRAVO, MD  azelastine  (ASTELIN ) 0.1 % nasal spray Place 1 spray into the nose daily as needed (allergies). 11/09/19   [provider]  brimonidine  (ALPHAGAN ) 0.2 % ophthalmic solution Place 1 drop into both eyes in the morning and at bedtime. 03/26/15   [provider]  clotrimazole -betamethasone  (LOTRISONE ) cream APPLY TOPICALLY ONCE DAILY Patient not taking: Reported on 03/21/2023 04/05/22   Gasper Nancyann BRAVO, MD  dorzolamide  (TRUSOPT ) 2 % ophthalmic solution Apply to eye. 11/08/21   [provider]  DULoxetine  (CYMBALTA ) 30 MG capsule Take 30 mg by mouth daily. 09/13/23 12/11/23  [provider]  dutasteride  (AVODART ) 0.5 MG capsule Take 1 capsule by mouth once daily 12/23/22   Gasper Nancyann BRAVO, MD  furosemide  (LASIX ) 20 MG tablet Take 20 mg by mouth daily.    [provider]  hydroxychloroquine (PLAQUENIL) 200 MG tablet Take 200 mg by mouth daily. 09/07/23   [provider]  ipratropium (ATROVENT ) 0.06 % nasal spray Place 1 spray into both nostrils 2 (two) times daily as needed for rhinitis. 03/15/18   [provider]  latanoprost  (XALATAN ) 0.005 % ophthalmic solution SMARTSIG:In Eye(s) 03/25/22   [provider]  levocetirizine (XYZAL ) 5 MG tablet Take 5 mg by mouth every evening.    [provider]  levothyroxine  (SYNTHROID ) 150 MCG tablet Take 1 tablet by mouth once daily 12/23/22   [name], [name] E, MD  losartan (COZAAR) 25 MG tablet Take 25 mg by mouth daily.    [provider]  lovastatin  (MEVACOR ) 40 MG tablet Take 1 tablet by mouth once daily 05/09/23   Gasper Nancyann BRAVO, MD  Multiple  Vitamin (DAILY VITAMIN PO) Take 1 tablet by mouth daily.    [provider]  pregabalin  (LYRICA ) 50 MG capsule Take 1 capsule (50 mg total) by mouth 2 (two) times daily. 09/28/23   Gasper Nancyann BRAVO, MD  vitamin B-12 (CYANOCOBALAMIN) 100 MCG tablet Take 100 mcg by mouth daily. Unsure of dose  is OTC med Patient not taking: Reported on 09/22/2023    [provider]     Critical care provider statement:   Total critical care time: 33 minutes   Performed by: Parris MD   Critical care time was exclusive of separately billable procedures and treating other patients.    Critical care was necessary to treat or prevent imminent or life-threatening deterioration.   Critical care was time spent personally by me on the following activities: development of treatment plan with patient and/or surrogate as well as nursing, discussions with consultants, evaluation of patient's response to treatment, examination of patient, obtaining history from patient or surrogate, ordering and performing treatments and interventions, ordering and review of laboratory studies, ordering and review of radiographic studies, pulse oximetry and re-evaluation of patient's condition.    Norabelle Kondo, M.D.  Pulmonary & Critical Care Medicine

## 2023-12-30 NOTE — Consult Note (Signed)
 PHARMACY CONSULT NOTE - ELECTROLYTES  Pharmacy Consult for Electrolyte Monitoring and Replacement   Recent Labs: Potassium (mmol/L)  Date Value  12/30/2023 3.8   Magnesium  (mg/dL)  Date Value  98/96/7974 2.1   Calcium (mg/dL)  Date Value  98/96/7974 9.0   Albumin  (g/dL)  Date Value  98/96/7974 2.2 (L)  09/28/2023 4.0   Phosphorus (mg/dL)  Date Value  98/96/7974 2.9   Sodium (mmol/L)  Date Value  12/30/2023 135  09/28/2023 132 (L)   Height: 6' 0.99 (185.4 cm) Weight: 106.4 kg (234 lb 9.1 oz) IBW/kg (Calculated) : 79.88 Estimated Creatinine Clearance: 41.9 mL/min (A) (by C-G formula based on SCr of 1.62 mg/dL (H)).  Assessment  Daniel Carr is a 89 y.o. male presenting with acute on chronic respiratory failure. PMH significant for HTN, hypothyroidism, HFpEF, COPD, aortic stenosis, Hx PE / CTEPH. Pharmacy has been consulted to monitor and replace electrolytes.  Diet: NPO MIVF: N/A Pertinent medications: N/A  Goal of Therapy: Electrolytes within normal limits  Plan:  --No replacement currently indicated --Patient is now on CRRT. Twice daily renal function panel labs ordered  Thank you for allowing pharmacy to be a part of this patient's care.  [name] 12/30/2023 7:31 AM

## 2023-12-30 NOTE — Progress Notes (Signed)
 [facility]  ROUNDING NOTE   Subjective:   Patient initiated on CRRT on 12/28/2023.  Update:  Extubation being considered today.  Patient maintained on CRRT.  UF achieved was 2 kg per nursing. Urine output in oliguric range.  Objective:  Vital signs in last 24 hours:  Temp:  [98 F (36.7 C)-98.8 F (37.1 C)] 98.4 F (36.9 C) (01/03 1600) Pulse Rate:  [62-98] 72 (01/03 1615) Resp:  [21-38] 22 (01/03 1615) BP: (87-144)/(48-88) 93/59 (01/03 1600) SpO2:  [77 %-100 %] 98 % (01/03 1615) Arterial Line BP: (63-219)/(41-115) 187/82 (01/03 1615) FiO2 (%):  [24 %] 24 % (01/03 1500) Weight:  [106.4 kg] 106.4 kg (01/03 0416)  Weight change: -0.2 kg Filed Weights   12/28/23 0231 12/29/23 0500 12/30/23 0416  Weight: 108 kg 106.6 kg 106.4 kg    Intake/Output: I/O last 3 completed shifts: In: 2087.5 [I.V.:1072.3; Other:5; NG/GT:360; IV Piggyback:650.2] Out: 4680 [Urine:520]   Intake/Output this shift:  Total I/O In: 547.7 [I.V.:67.4; NG/GT:340.3; IV Piggyback:140] Out: 1446   Physical Exam: General: Critically ill-appearing  Head: Endotracheal tube in place  Neck: Supple  Lungs:  Scattered rhonchi, vent assisted  Heart: S1S2 no rubs  Abdomen:  Soft, nontender, bowel sounds present  Extremities: 2+ peripheral edema.  Neurologic: Intubated  Skin: No acute rash  Access: Left IJ temporary dialysis catheter    Basic Metabolic Panel: Recent Labs  Lab 12/27/23 1650 12/27/23 1956 12/28/23 0427 12/28/23 1556 12/29/23 0501 12/29/23 1702 12/30/23 0408  NA  --    < > 127* 130* 130* 131* 135  K  --    < > 4.6 4.5 3.9 3.9 3.8  CL  --    < > 91* 94* 98 96* 102  CO2  --    < > 23 23 21* 23 26  GLUCOSE  --    < > 124* 138* 124* 119* 134*  BUN  --    < > 71* 56* 46* 41* 40*  CREATININE  --    < > 3.62* 2.86* 2.03* 1.92* 1.62*  CALCIUM  --    < > 8.1* 8.4* 8.1* 8.8* 9.0  MG 2.2  --  2.0  --  2.1  --  2.1  PHOS 6.3*  --  3.8 3.7 3.0 3.1 2.9   < > = values in this  interval not displayed.    Liver Function Tests: Recent Labs  Lab 12/27/23 1202 12/28/23 0427 12/28/23 1556 12/29/23 0501 12/29/23 1702 12/30/23 0408  AST 143* 72*  --   --   --   --   ALT 89* 59*  --   --   --   --   ALKPHOS 80 63  --   --   --   --   BILITOT 0.9 0.9  --   --   --   --   PROT 6.8 5.4*  --   --   --   --   ALBUMIN  2.5* 2.1*  2.0* 2.3* 1.9* 2.2* 2.2*   Recent Labs  Lab 12/27/23 1202  LIPASE 25   Recent Labs  Lab 12/27/23 1650 12/28/23 0427  AMMONIA 99* 31    CBC: Recent Labs  Lab 12/27/23 1202 12/28/23 0427 12/29/23 0501 12/30/23 0408  WBC 15.9* 13.0* 12.1* 15.1*  NEUTROABS 12.3*  --   --   --   HGB 11.2* 9.2* 8.1* 8.0*  HCT 36.3* 26.5* 23.0* 23.2*  MCV 101.4* 91.1 89.5 89.6  PLT 151 152 150  179    Cardiac Enzymes: No results for input(s): CKTOTAL, CKMB, CKMBINDEX, TROPONINI in the last 168 hours.  BNP: Invalid input(s): POCBNP  CBG: Recent Labs  Lab 12/29/23 2325 12/30/23 0401 12/30/23 0739 12/30/23 1139 12/30/23 1628  GLUCAP 129* 123* 143* 138* 136*    Microbiology: Results for orders placed or performed during the hospital encounter of 12/27/23  Blood Culture (routine x 2)     Status: Abnormal   Collection Time: 12/27/23 12:02 PM   Specimen: BLOOD  Result Value Ref Range Status   Specimen Description   Final    BLOOD BLOOD RIGHT HAND Performed at [facility], [address], [place], [place] [address]    Special Requests   Final    BOTTLES DRAWN AEROBIC AND ANAEROBIC Blood Culture adequate volume Performed at [facility], [address], [place], [place] [address]    Culture  Setup Time   Final    GRAM POSITIVE COCCI IN BOTH AEROBIC AND ANAEROBIC BOTTLES Organism ID to follow ENTEROCOCCUS FAECALIS CRITICAL RESULT CALLED TO, READ BACK BY AND VERIFIED WITH: JASON ROBBINS @ 0254 12/28/23 [facility] Performed at [facility], [address] [place], [place] [address]    Culture  ENTEROCOCCUS FAECALIS (A)  Final   Report Status 12/30/2023 FINAL  Final   Organism ID, Bacteria ENTEROCOCCUS FAECALIS  Final      Susceptibility   Enterococcus faecalis - MIC*    AMPICILLIN  <=2 SENSITIVE Sensitive     VANCOMYCIN  2 SENSITIVE Sensitive     GENTAMICIN SYNERGY SENSITIVE Sensitive     LINEZOLID 2 SENSITIVE Sensitive     * ENTEROCOCCUS FAECALIS  Blood Culture ID Panel (Reflexed)     Status: Abnormal   Collection Time: 12/27/23 12:02 PM  Result Value Ref Range Status   Enterococcus faecalis DETECTED (A) NOT DETECTED Final    Comment: CRITICAL RESULT CALLED TO, READ BACK BY AND VERIFIED WITH: JASON ROBBINS @ 0245 12/28/23 [facility]    Enterococcus Faecium NOT DETECTED NOT DETECTED Final   Listeria monocytogenes NOT DETECTED NOT DETECTED Final   Staphylococcus species NOT DETECTED NOT DETECTED Final   Staphylococcus aureus (BCID) NOT DETECTED NOT DETECTED Final   Staphylococcus epidermidis NOT DETECTED NOT DETECTED Final   Staphylococcus lugdunensis NOT DETECTED NOT DETECTED Final   Streptococcus species NOT DETECTED NOT DETECTED Final   Streptococcus agalactiae NOT DETECTED NOT DETECTED Final   Streptococcus pneumoniae NOT DETECTED NOT DETECTED Final   Streptococcus pyogenes NOT DETECTED NOT DETECTED Final   A.calcoaceticus-baumannii NOT DETECTED NOT DETECTED Final   Bacteroides fragilis NOT DETECTED NOT DETECTED Final   Enterobacterales NOT DETECTED NOT DETECTED Final   Enterobacter cloacae complex NOT DETECTED NOT DETECTED Final   Escherichia coli NOT DETECTED NOT DETECTED Final   Klebsiella aerogenes NOT DETECTED NOT DETECTED Final   Klebsiella oxytoca NOT DETECTED NOT DETECTED Final   Klebsiella pneumoniae NOT DETECTED NOT DETECTED Final   Proteus species NOT DETECTED NOT DETECTED Final   Salmonella species NOT DETECTED NOT DETECTED Final   Serratia marcescens NOT DETECTED NOT DETECTED Final   Haemophilus influenzae NOT DETECTED NOT DETECTED Final   Neisseria meningitidis  NOT DETECTED NOT DETECTED Final   Pseudomonas aeruginosa NOT DETECTED NOT DETECTED Final   Stenotrophomonas maltophilia NOT DETECTED NOT DETECTED Final   Candida albicans NOT DETECTED NOT DETECTED Final   Candida auris NOT DETECTED NOT DETECTED Final   Candida glabrata NOT DETECTED NOT DETECTED Final   Candida krusei NOT DETECTED NOT DETECTED Final  Candida parapsilosis NOT DETECTED NOT DETECTED Final   Candida tropicalis NOT DETECTED NOT DETECTED Final   Cryptococcus neoformans/gattii NOT DETECTED NOT DETECTED Final   Vancomycin  resistance NOT DETECTED NOT DETECTED Final    Comment: Performed at [facility], [address] [place], [place] [address]  MRSA Next Gen by PCR, Nasal     Status: None   Collection Time: 12/27/23  4:44 PM   Specimen: Nasal Mucosa; Nasal Swab  Result Value Ref Range Status   MRSA by PCR Next Gen NOT DETECTED NOT DETECTED Final    Comment: (NOTE) The GeneXpert MRSA Assay (FDA approved for NASAL specimens only), is one component of a comprehensive MRSA colonization surveillance program. It is not intended to diagnose MRSA infection nor to guide or monitor treatment for MRSA infections. Test performance is not FDA approved in patients less than 29 years old. Performed at [facility], [address] [place], [place] [address]   Resp panel by RT-PCR (RSV, Flu A&B, Covid) Anterior Nasal Swab     Status: None   Collection Time: 12/27/23  4:48 PM   Specimen: Anterior Nasal Swab  Result Value Ref Range Status   SARS Coronavirus 2 by RT PCR NEGATIVE NEGATIVE Final    Comment: (NOTE) SARS-CoV-2 target nucleic acids are NOT DETECTED.  The SARS-CoV-2 RNA is generally detectable in upper respiratory specimens during the acute phase of infection. The lowest concentration of SARS-CoV-2 viral copies this assay can detect is 138 copies/mL. A negative result does not preclude SARS-Cov-2 infection and should not be used as the sole basis for  treatment or other patient management decisions. A negative result may occur with  improper specimen collection/handling, submission of specimen other than nasopharyngeal swab, presence of viral mutation(s) within the areas targeted by this assay, and inadequate number of viral copies(<138 copies/mL). A negative result must be combined with clinical observations, patient history, and epidemiological information. The expected result is Negative.  Fact Sheet for Patients:  bloggercourse.com  Fact Sheet for Healthcare Providers:  seriousbroker.it  This test is no t yet approved or cleared by the [place]  FDA and  has been authorized for detection and/or diagnosis of SARS-CoV-2 by FDA under an Emergency Use Authorization (EUA). This EUA will remain  in effect (meaning this test can be used) for the duration of the COVID-19 declaration under Section 564(b)(1) of the Act, 21 U.S.C.section 360bbb-3(b)(1), unless the authorization is terminated  or revoked sooner.       Influenza A by PCR NEGATIVE NEGATIVE Final   Influenza B by PCR NEGATIVE NEGATIVE Final    Comment: (NOTE) The Xpert Xpress SARS-CoV-2/FLU/RSV plus assay is intended as an aid in the diagnosis of influenza from Nasopharyngeal swab specimens and should not be used as a sole basis for treatment. Nasal washings and aspirates are unacceptable for Xpert Xpress SARS-CoV-2/FLU/RSV testing.  Fact Sheet for Patients: bloggercourse.com  Fact Sheet for Healthcare Providers: seriousbroker.it  This test is not yet approved or cleared by the [place]  FDA and has been authorized for detection and/or diagnosis of SARS-CoV-2 by FDA under an Emergency Use Authorization (EUA). This EUA will remain in effect (meaning this test can be used) for the duration of the COVID-19 declaration under Section 564(b)(1) of the Act, 21  U.S.C. section 360bbb-3(b)(1), unless the authorization is terminated or revoked.     Resp Syncytial Virus by PCR NEGATIVE NEGATIVE Final    Comment: (NOTE) Fact Sheet for Patients: bloggercourse.com  Fact Sheet for Healthcare  Providers: seriousbroker.it  This test is not yet approved or cleared by the [place]  FDA and has been authorized for detection and/or diagnosis of SARS-CoV-2 by FDA under an Emergency Use Authorization (EUA). This EUA will remain in effect (meaning this test can be used) for the duration of the COVID-19 declaration under Section 564(b)(1) of the Act, 21 U.S.C. section 360bbb-3(b)(1), unless the authorization is terminated or revoked.  Performed at [facility], [address] [place], [place] [address]   Blood Culture (routine x 2)     Status: None (Preliminary result)   Collection Time: 12/27/23  4:50 PM   Specimen: BLOOD  Result Value Ref Range Status   Specimen Description BLOOD BLOOD LEFT HAND  Final   Special Requests   Final    BOTTLES DRAWN AEROBIC ONLY Blood Culture results may not be optimal due to an inadequate volume of blood received in culture bottles   Culture   Final    NO GROWTH 3 DAYS Performed at [facility], [address] [place], [place] [address]    Report Status PENDING  Incomplete  Urine Culture     Status: Abnormal (Preliminary result)   Collection Time: 12/28/23 12:24 AM   Specimen: Urine, Random  Result Value Ref Range Status   Specimen Description   Final    URINE, RANDOM Performed at [facility], [address] [place], [place] [address]    Special Requests   Final    NONE Performed at [facility], [address], [place], [place] [address]    Culture (A)  Final    50,000 COLONIES/mL ENTEROCOCCUS FAECALIS SUSCEPTIBILITIES TO FOLLOW Performed at [facility], [address], [place], [place] [address]     Report Status PENDING  Incomplete  Culture, Respiratory w Gram Stain     Status: None (Preliminary result)   Collection Time: 12/28/23 11:22 PM   Specimen: Tracheal Aspirate; Respiratory  Result Value Ref Range Status   Specimen Description   Final    TRACHEAL ASPIRATE Performed at [facility], [address] [place], [place] [address]    Special Requests   Final    NONE Performed at [facility], [address] [place], [place] [address]    Gram Stain   Final    FEW WBC PRESENT, PREDOMINANTLY PMN FEW GRAM POSITIVE COCCI IN PAIRS FEW GRAM VARIABLE ROD    Culture   Final    CULTURE REINCUBATED FOR BETTER GROWTH Performed at [facility], [address], [place], [place] [address]    Report Status PENDING  Incomplete    Coagulation Studies: No results for input(s): LABPROT, INR in the last 72 hours.   Urinalysis: Recent Labs    12/28/23 0024  COLORURINE AMBER*  LABSPEC 1.013  PHURINE 5.0  GLUCOSEU NEGATIVE  HGBUR LARGE*  BILIRUBINUR NEGATIVE  KETONESUR NEGATIVE  PROTEINUR 100*  NITRITE NEGATIVE  LEUKOCYTESUR NEGATIVE      Imaging: DG Chest Port 1 View Result Date: 12/30/2023 CLINICAL DATA:  Lung infiltrate. EXAM: PORTABLE CHEST 1 VIEW COMPARISON:  X-ray 12/27/2023. older exams as well FINDINGS: Stable ET tube, enteric tube left IJ double-lumen catheter. Rotated radiograph to the right with some kyphosis obscuring the right lung apex. Stable elevation of the right hemidiaphragm. Persistent right lung base opacity with a point of effusion. No edema or pneumothorax. Overlapping cardiac leads. IMPRESSION: No significant interval change when adjusting for technique. Electronically Signed   By: Ranell  Charlanne M.D.   On: 12/30/2023 13:05     Medications:    ampicillin  (OMNIPEN) IV Stopped (12/30/23 1129)   feeding supplement (PIVOT 1.5 CAL) 40 mL/hr at 12/30/23 1620   fentaNYL  infusion INTRAVENOUS Stopped (12/30/23 1036)   norepinephrine   (LEVOPHED ) Adult infusion 1 mcg/min (12/30/23 1620)   PrismaSol  BGK 2/3.5 400 mL/hr at 12/30/23 1508   PrismaSol  BGK 2/3.5 400 mL/hr at 12/30/23 1505   PrismaSol  BGK 2/3.5 1,000 mL/hr at 12/30/23 1232   propofol  (DIPRIVAN ) infusion Stopped (12/30/23 0801)    Chlorhexidine  Gluconate Cloth  6 each Topical Daily   docusate  100 mg Per Tube BID   famotidine   20 mg Per Tube Daily   feeding supplement (PROSource TF20)  60 mL Per Tube BID   free water   30 mL Per Tube Q6H   heparin  injection (subcutaneous)  5,000 Units Subcutaneous Q8H   ipratropium-albuterol   3 mL Nebulization Q6H   multivitamin  1 tablet Per Tube QHS   nutrition supplement (JUVEN)  1 packet Per Tube BID BM   mouth rinse  15 mL Mouth Rinse Q2H   polyethylene glycol  17 g Per Tube Daily   docusate, fentaNYL , fentaNYL  (SUBLIMAZE ) injection, heparin , ipratropium-albuterol , mouth rinse, polyethylene glycol, sodium chloride   Assessment/ Plan:  89 y.o. male with past medical history of hypertension, hypothyroidism, neuropathy, COPD, MGUS, history of PE on apixaban , diastolic heart failure, hyperlipidemia who was admitted on 12/27/2023 for acute hypoxic respiratory failure.  1.  Acute kidney injury/acute metabolic acidosis/hyperkalemia/hyponatremia.  Baseline creatinine 0.9 on 12/13/2023.  Update:  Serum electrolytes improved with CRRT.  Continue CRRT with current parameters of UF target of 100 cc/h.  If he stabilizes over the weekend we could consider transitioning him to intermittent hemodialysis.  2.  Acute respiratory failure.  Extubation being considered by pulmonary/critical care.  Continue UF to aid with extubation.  3.  Hypotension secondary to systemic shock from sepsis and volume depletion.  Norepinephrine  being weaned down.   LOS: 3 Daniel Carr 1/3/20254:32 PM

## 2023-12-31 LAB — CBC WITH DIFFERENTIAL/PLATELET
Abs Immature Granulocytes: 0.13 10*3/uL — ABNORMAL HIGH (ref 0.00–0.07)
Basophils Absolute: 0 10*3/uL (ref 0.0–0.1)
Basophils Relative: 0 %
Eosinophils Absolute: 0 10*3/uL (ref 0.0–0.5)
Eosinophils Relative: 0 %
HCT: 26.2 % — ABNORMAL LOW (ref 39.0–52.0)
Hemoglobin: 8.8 g/dL — ABNORMAL LOW (ref 13.0–17.0)
Immature Granulocytes: 1 %
Lymphocytes Relative: 7 %
Lymphs Abs: 1.2 10*3/uL (ref 0.7–4.0)
MCH: 31.2 pg (ref 26.0–34.0)
MCHC: 33.6 g/dL (ref 30.0–36.0)
MCV: 92.9 fL (ref 80.0–100.0)
Monocytes Absolute: 1.3 10*3/uL — ABNORMAL HIGH (ref 0.1–1.0)
Monocytes Relative: 8 %
Neutro Abs: 13.8 10*3/uL — ABNORMAL HIGH (ref 1.7–7.7)
Neutrophils Relative %: 84 %
Platelets: 212 10*3/uL (ref 150–400)
RBC: 2.82 MIL/uL — ABNORMAL LOW (ref 4.22–5.81)
RDW: 15 % (ref 11.5–15.5)
WBC: 16.5 10*3/uL — ABNORMAL HIGH (ref 4.0–10.5)
nRBC: 0.2 % (ref 0.0–0.2)

## 2023-12-31 LAB — BLOOD GAS, ARTERIAL
Acid-Base Excess: 2.4 mmol/L — ABNORMAL HIGH (ref 0.0–2.0)
Bicarbonate: 27.2 mmol/L (ref 20.0–28.0)
Delivery systems: POSITIVE
Expiratory PAP: 6 cm[H2O]
FIO2: 24 %
Inspiratory PAP: 14 cm[H2O]
O2 Saturation: 98.8 %
Patient temperature: 37
RATE: 10 {breaths}/min
pCO2 arterial: 42 mm[Hg] (ref 32–48)
pH, Arterial: 7.42 (ref 7.35–7.45)
pO2, Arterial: 105 mm[Hg] (ref 83–108)

## 2023-12-31 LAB — RENAL FUNCTION PANEL
Albumin: 2.5 g/dL — ABNORMAL LOW (ref 3.5–5.0)
Albumin: 2.6 g/dL — ABNORMAL LOW (ref 3.5–5.0)
Anion gap: 9 (ref 5–15)
Anion gap: 10 (ref 5–15)
BUN: 49 mg/dL — ABNORMAL HIGH (ref 8–23)
BUN: 48 mg/dL — ABNORMAL HIGH (ref 8–23)
CO2: 24 mmol/L (ref 22–32)
CO2: 23 mmol/L (ref 22–32)
Calcium: 9.4 mg/dL (ref 8.9–10.3)
Calcium: 9.1 mg/dL (ref 8.9–10.3)
Chloride: 102 mmol/L (ref 98–111)
Chloride: 101 mmol/L (ref 98–111)
Creatinine, Ser: 1.47 mg/dL — ABNORMAL HIGH (ref 0.61–1.24)
Creatinine, Ser: 1.39 mg/dL — ABNORMAL HIGH (ref 0.61–1.24)
GFR, Estimated: 46 mL/min — ABNORMAL LOW (ref >60)
GFR, Estimated: 49 mL/min — ABNORMAL LOW (ref >60)
Glucose, Bld: 167 mg/dL — ABNORMAL HIGH (ref 70–99)
Glucose, Bld: 115 mg/dL — ABNORMAL HIGH (ref 70–99)
Phosphorus: 2.6 mg/dL (ref 2.5–4.6)
Phosphorus: 2.4 mg/dL — ABNORMAL LOW (ref 2.5–4.6)
Potassium: 3.6 mmol/L (ref 3.5–5.1)
Potassium: 3.7 mmol/L (ref 3.5–5.1)
Sodium: 135 mmol/L (ref 135–145)
Sodium: 134 mmol/L — ABNORMAL LOW (ref 135–145)

## 2023-12-31 LAB — GLUCOSE, CAPILLARY
Glucose-Capillary: 103 mg/dL — ABNORMAL HIGH (ref 70–99)
Glucose-Capillary: 117 mg/dL — ABNORMAL HIGH (ref 70–99)
Glucose-Capillary: 120 mg/dL — ABNORMAL HIGH (ref 70–99)
Glucose-Capillary: 149 mg/dL — ABNORMAL HIGH (ref 70–99)
Glucose-Capillary: 175 mg/dL — ABNORMAL HIGH (ref 70–99)
Glucose-Capillary: 95 mg/dL (ref 70–99)

## 2023-12-31 LAB — URINE CULTURE: Culture: 50000 — AB

## 2023-12-31 LAB — TRIGLYCERIDES: Triglycerides: 118 mg/dL (ref <150)

## 2023-12-31 LAB — MAGNESIUM: Magnesium: 2 mg/dL (ref 1.7–2.4)

## 2023-12-31 MED ORDER — POTASSIUM CHLORIDE 10 MEQ/100ML IV SOLN
10.0000 meq | INTRAVENOUS | Status: AC
  Administered 2023-12-31 (×2): 10 meq via INTRAVENOUS
  Filled 2023-12-31 (×2): qty 100

## 2023-12-31 MED ORDER — ORAL CARE MOUTH RINSE
15.0000 mL | OROMUCOSAL | Status: DC
  Administered 2023-12-31 – 2024-01-02 (×9): 15 mL via OROMUCOSAL

## 2023-12-31 MED ORDER — NOREPINEPHRINE 4 MG/250ML-% IV SOLN
0.0000 ug/min | INTRAVENOUS | Status: DC
  Administered 2023-12-31: 4 ug/min via INTRAVENOUS

## 2023-12-31 MED ORDER — DEXMEDETOMIDINE HCL IN NACL 400 MCG/100ML IV SOLN
INTRAVENOUS | Status: AC
  Filled 2023-12-31: qty 100

## 2023-12-31 MED ORDER — DEXMEDETOMIDINE HCL IN NACL 400 MCG/100ML IV SOLN
0.0000 ug/kg/h | INTRAVENOUS | Status: DC
  Administered 2023-12-31: 0.7 ug/kg/h via INTRAVENOUS
  Administered 2023-12-31: 0.4 ug/kg/h via INTRAVENOUS
  Administered 2023-12-31: 0.3 ug/kg/h via INTRAVENOUS
  Administered 2024-01-01: 0.8 ug/kg/h via INTRAVENOUS
  Filled 2023-12-31 (×3): qty 100

## 2023-12-31 MED ORDER — DORZOLAMIDE HCL 2 % OP SOLN
1.0000 [drp] | Freq: Three times a day (TID) | OPHTHALMIC | Status: DC
  Administered 2023-12-31 – 2024-01-11 (×34): 1 [drp] via OPHTHALMIC
  Filled 2023-12-31: qty 10

## 2023-12-31 MED ORDER — INSULIN ASPART 100 UNIT/ML IJ SOLN
0.0000 [IU] | INTRAMUSCULAR | Status: DC
  Administered 2023-12-31: 1 [IU] via SUBCUTANEOUS
  Filled 2023-12-31: qty 1

## 2023-12-31 MED ORDER — NOREPINEPHRINE 4 MG/250ML-% IV SOLN
INTRAVENOUS | Status: AC
  Administered 2023-12-31: 4 mg via INTRAVENOUS
  Filled 2023-12-31: qty 250

## 2023-12-31 MED ORDER — ORAL CARE MOUTH RINSE: 15.0000 mL | OROMUCOSAL | Status: DC | PRN

## 2023-12-31 NOTE — Progress Notes (Signed)
 Pt is very anxious on bipap and respiratory rate has increased. Precedex gtt started at 0.4 per Annabelle Harman, NP. After starting precedex pt looks much more relaxed and is tolerating well.

## 2023-12-31 NOTE — Progress Notes (Addendum)
 [facility]  PROGRESS NOTE   Subjective:   Patient on BiPAP.  CRRT in progress.  Objective:  Vital signs: Blood pressure 130/73, pulse 70, temperature 98.4 F (36.9 C), temperature source Axillary, resp. rate (!) 23, height 6' 0.99 (1.854 m), weight 101.9 kg, SpO2 99%.  Intake/Output Summary (Last 24 hours) at 12/31/2023 1443 Last data filed at 12/31/2023 1400 Gross per 24 hour  Intake 2364.22 ml  Output 4719 ml  Net -2354.78 ml   Filed Weights   12/29/23 0500 12/30/23 0416 12/31/23 0411  Weight: 106.6 kg 106.4 kg 101.9 kg     Physical Exam: General:  No acute distress  Head:  Normocephalic, atraumatic. Moist oral mucosal membranes  Eyes:  Anicteric  Neck:  Supple  Lungs:   Clear to auscultation, normal effort  Heart:  S1S2 no rubs  Abdomen:   Soft, nontender, bowel sounds present  Extremities:  peripheral edema.  Neurologic:  Awake, alert, following commands  Skin:  No lesions  Access:     Basic Metabolic Panel: Recent Labs  Lab 12/27/23 1650 12/27/23 1956 12/28/23 0427 12/28/23 1556 12/29/23 0501 12/29/23 1702 12/30/23 0408 12/30/23 1607 12/31/23 0412  NA  --    < > 127*   < > 130* 131* 135 132* 135  K  --    < > 4.6   < > 3.9 3.9 3.8 4.1 3.6  CL  --    < > 91*   < > 98 96* 102 100 102  CO2  --    < > 23   < > 21* 23 26 23 24   GLUCOSE  --    < > 124*   < > 124* 119* 134* 140* 167*  BUN  --    < > 71*   < > 46* 41* 40* 44* 49*  CREATININE  --    < > 3.62*   < > 2.03* 1.92* 1.62* 1.64* 1.47*  CALCIUM  --    < > 8.1*   < > 8.1* 8.8* 9.0 9.1 9.4  MG 2.2  --  2.0  --  2.1  --  2.1  --  2.0  PHOS 6.3*  --  3.8   < > 3.0 3.1 2.9 2.7 2.6   < > = values in this interval not displayed.   GFR: Estimated Creatinine Clearance: 45.3 mL/min (A) (by C-G formula based on SCr of 1.47 mg/dL (H)).  Liver Function Tests: Recent Labs  Lab 12/27/23 1202 12/28/23 0427 12/28/23 1556 12/29/23 0501 12/29/23 1702 12/30/23 0408 12/30/23 1607 12/31/23 0412   AST 143* 72*  --   --   --   --   --   --   ALT 89* 59*  --   --   --   --   --   --   ALKPHOS 80 63  --   --   --   --   --   --   BILITOT 0.9 0.9  --   --   --   --   --   --   PROT 6.8 5.4*  --   --   --   --   --   --   ALBUMIN  2.5* 2.1*  2.0*   < > 1.9* 2.2* 2.2* 2.4* 2.5*   < > = values in this interval not displayed.   Recent Labs  Lab 12/27/23 1202  LIPASE 25   Recent Labs  Lab 12/27/23 1650 12/28/23 0427  AMMONIA 99* 31    CBC: Recent Labs  Lab 12/27/23 1202 12/28/23 0427 12/29/23 0501 12/30/23 0408 12/31/23 0839  WBC 15.9* 13.0* 12.1* 15.1* 16.5*  NEUTROABS 12.3*  --   --   --  13.8*  HGB 11.2* 9.2* 8.1* 8.0* 8.8*  HCT 36.3* 26.5* 23.0* 23.2* 26.2*  MCV 101.4* 91.1 89.5 89.6 92.9  PLT 151 152 150 179 212     HbA1C: No results found for: HGBA1C  Urinalysis: No results for input(s): COLORURINE, LABSPEC, PHURINE, GLUCOSEU, HGBUR, BILIRUBINUR, KETONESUR, PROTEINUR, UROBILINOGEN, NITRITE, LEUKOCYTESUR in the last 72 hours.  Invalid input(s): APPERANCEUR    Imaging: DG Chest Port 1 View Result Date: 12/30/2023 CLINICAL DATA:  Lung infiltrate. EXAM: PORTABLE CHEST 1 VIEW COMPARISON:  X-ray 12/27/2023. older exams as well FINDINGS: Stable ET tube, enteric tube left IJ double-lumen catheter. Rotated radiograph to the right with some kyphosis obscuring the right lung apex. Stable elevation of the right hemidiaphragm. Persistent right lung base opacity with a point of effusion. No edema or pneumothorax. Overlapping cardiac leads. IMPRESSION: No significant interval change when adjusting for technique. Electronically Signed   By: Ranell Bring M.D.   On: 12/30/2023 13:05     Medications:    ampicillin  (OMNIPEN) IV Stopped (12/31/23 1221)   dexmedetomidine  (PRECEDEX ) IV infusion 0.8 mcg/kg/hr (12/31/23 1400)   dexmedetomidine      feeding supplement (PIVOT 1.5 CAL) Stopped (12/31/23 1130)   fentaNYL  infusion INTRAVENOUS Stopped  (12/31/23 0831)   norepinephrine  (LEVOPHED ) Adult infusion 1 mcg/min (12/31/23 1400)   PrismaSol  BGK 2/3.5 400 mL/hr at 12/31/23 0521   PrismaSol  BGK 2/3.5 400 mL/hr at 12/31/23 0521   PrismaSol  BGK 2/3.5 1,000 mL/hr at 12/31/23 1031   propofol  (DIPRIVAN ) infusion Stopped (12/31/23 0756)    Chlorhexidine  Gluconate Cloth  6 each Topical Daily   docusate  100 mg Per Tube BID   dorzolamide   1 drop Left Eye TID   famotidine   20 mg Per Tube Daily   feeding supplement (PROSource TF20)  60 mL Per Tube BID   free water   30 mL Per Tube Q6H   heparin  injection (subcutaneous)  5,000 Units Subcutaneous Q8H   insulin  aspart  0-6 Units Subcutaneous Q4H   ipratropium-albuterol   3 mL Nebulization Q6H   multivitamin  1 tablet Per Tube QHS   nutrition supplement (JUVEN)  1 packet Per Tube BID BM   mouth rinse  15 mL Mouth Rinse 4 times per day   polyethylene glycol  17 g Per Tube Daily    Assessment/ Plan:     89 y.o. male with past medical history of hypertension, hypothyroidism, neuropathy, COPD, MGUS, history of PE on apixaban , diastolic heart failure, hyperlipidemia who was admitted on 12/27/2023 for acute hypoxic respiratory failure.   1.  Acute kidney injury/acute metabolic acidosis/hyperkalemia/hyponatremia.  Baseline creatinine 0.9 on 12/13/2023.  Update:  Serum electrolytes improved with CRRT.  Continue CRRT with current parameters of UF target of 100 cc/h.  If he stabilizes over the weekend we could consider transitioning him to intermittent hemodialysis.   2.  Acute respiratory failure.  Patient is now on BiPAP.     3.  Hypotension secondary to systemic shock from sepsis and volume depletion.  Norepinephrine  being weaned down.  Spoke to family at bedside. Labs and medications reviewed. Will continue to follow along with you.   LOS: 4 Caryl Manas, MD [facility] 1/4/20252:43 PM

## 2023-12-31 NOTE — Consult Note (Signed)
 PHARMACY CONSULT NOTE - ELECTROLYTES  Pharmacy Consult for Electrolyte Monitoring and Replacement   Recent Labs: Potassium (mmol/L)  Date Value  12/31/2023 3.6   Magnesium  (mg/dL)  Date Value  98/95/7974 2.0   Calcium (mg/dL)  Date Value  98/95/7974 9.4   Albumin  (g/dL)  Date Value  98/95/7974 2.5 (L)  09/28/2023 4.0   Phosphorus (mg/dL)  Date Value  98/95/7974 2.6   Sodium (mmol/L)  Date Value  12/31/2023 135  09/28/2023 132 (L)   Height: 6' 0.99 (185.4 cm) Weight: 101.9 kg (224 lb 10.4 oz) IBW/kg (Calculated) : 79.88 Estimated Creatinine Clearance: 45.3 mL/min (A) (by C-G formula based on SCr of 1.47 mg/dL (H)).  Assessment  Daniel Carr is a 89 y.o. male presenting with acute on chronic respiratory failure. PMH significant for HTN, hypothyroidism, HFpEF, COPD, aortic stenosis, Hx PE / CTEPH. Pharmacy has been consulted to monitor and replace electrolytes. Patient initiated on CRRT on 12/28/2023   Diet: NPO MIVF: N/A Pertinent medications: N/A  Goal of Therapy: Electrolytes within normal limits  Plan:  --Kcl 10mEq IV x 2 --Patient is now on CRRT. Twice daily renal function panel labs ordered  Thank you for allowing pharmacy to be a part of this patient's care.  Hailey Miles A Mynor Witkop, PharmD Clinical Pharmacist 12/31/2023 7:19 AM

## 2023-12-31 NOTE — Progress Notes (Signed)
 NAME:  Daniel Carr, MRN:  [id number], DOB:  Apr 08, 1935, LOS: 4 ADMISSION DATE:  12/27/2023, CONSULTATION DATE: 12/27/2023 REFERRING MD: Dr. Dicky, CHIEF COMPLAINT: Shortness of Breath    History of Present Illness:  This is an 89 yo male who presented to [facility] ER via EMS on 12/31 from [facility] with altered mental status and acute respiratory failure.  Pts family reports he has had worsening bilateral lower extremity edema and decreased urinary output.  His PCP increased his outpatient lasix  dose to 40 mg daily, however his urinary output did not improve.  Pts granddaughter reports he also c/o chills and fevers.  He was recently hospitalized at [facility] on 12/02/23 with a right humerus fracture from a mechanical fall on 12/3 and evaluated by ortho who recommended a sling for comfort and outpatient follow-up.  However, during presentation he was found to have progressive dyspnea; severe pulmonary hypertension; hyponatremia; acute blood loss anemia suspected secondary to right chest wall hematoma and acute kidney injury on chronic kidney disease requiring hospital admission.  Following treatment he was discharged on 12/17 to [facility] for rehab and instructed not to resume his outpatient apixaban  until 12/18.  His daughter reports the pts apixaban  has been resumed as instructed.    ED Course  Upon arrival to the ER pt in severe respiratory distress, hypotensive, and minimally responsive requiring mechanical intubation.  Significant lab results were: Na+ 125/K+ 5.9/chloride 86/BUN 72/creatinine 4.46/calcium 8.7/albumin  2.5/AST 143/ALT 89/troponin 550/lactic acid 2.4/wbc 15.9/hgb 11.2/PT 25.0/INR 2.2/APTT 48.  CXR concerning for possible small right pleural effusion.  He met sepsis criteria and received: 2L saline bolus; vancomycin , and aztreonam .  Despite iv fluid resuscitation pt remained hypotensive requiring levophed  gtt.  PCCM team contacted for ICU admission.   12/27/22- patient with improved  mentation post CRRT.  Reducing his sedatives on MV today.  Sepsis bacteremia, ID on case.  He is oozing blood from IV sites, will order DIC panel and pathology slide review.   12/29/23- patient following verbal communication this am, remains in anasarca. Bacteremia being treated. He has CRRT with now removal of fluid at 100/hr.  Starting NG feeds.  Tracheal aspirate with GPCs in pairs.  ID on case continues with ampicillin  reducing steroids and off levophed  today. 12/30/23- patient for SBT today.  Last day of steroids today. Failed SBT today.  S/p iD evaluation 12/31/23-patient liberated from MV today on BIPAP, required precedex  for aggitation. Remain on ampicillin . Met with daughter and granddaughter few times to review findings and medical plan.    Pertinent  Medical History  Arthritis  Glaucoma HTN  Hypothyroidism  HLD Hyponatremia  HFpEF  Right Humerus Fracture secondary to mechanical fall (11/29/2023) Anemia of Chronic Disease  COPD Aortic Stenosis   MGUS PE in 2022 previously on apixaban  but pt self discontinued restarted on 12/14/23 due to CTEPH   Significant Hospital Events: Including procedures, antibiotic start and stop dates in addition to other pertinent events   12/31: Pt admitted with acute hypercapnic respiratory failure, septic shock, AECOPD, pneumonia suspect aspiration, acute kidney injury, acute on chronic hyponatremia, transaminitis, and acute metabolic encephalopathy mechanical intubation    Interim History / Subjective:  Pt sedated with fentanyl  gtt at 25 mcg/hr and mechanically intubated requiring levophed  gtt @4  mcg/min to maintain map 65 or higher.    Objective   Blood pressure (!) 119/106, pulse 76, temperature 98.1 F (36.7 C), temperature source Axillary, resp. rate (!) 22, height 6' 0.99 (1.854 m), weight 101.9 kg, SpO2 96%.  Vent Mode: PRVC FiO2 (%):  [24 %] 24 % Set Rate:  [22 bmp] 22 bmp Vt Set:  [500 mL] 500 mL PEEP:  [5 cmH20] 5 cmH20 Pressure  Support:  [5 cmH20-10 cmH20] 5 cmH20   Intake/Output Summary (Last 24 hours) at 12/31/2023 0914 Last data filed at 12/31/2023 0900 Gross per 24 hour  Intake 2256.74 ml  Output 4383 ml  Net -2126.26 ml   Filed Weights   12/29/23 0500 12/30/23 0416 12/31/23 0411  Weight: 106.6 kg 106.4 kg 101.9 kg   Examination: General: Acute on chronically-ill appearing male, on BIPAP HENT: Supple, no JVD present  Lungs: Diminished throughout, even, non labored  Cardiovascular: NSR, s1s2, no r/g, 2+ radial/1+ distal pulses, 3+ bilateral lower extremity pitting edema  Abdomen: +BS x4, obese, soft, non distended  Extremities: Right arm sling present secondary to recent right humerus fracture with 2+ edema  Skin: Intact no rashes/lesions/pressure ulcers present  Neuro: no FND grossly, but remains sedated on precedex .  corneal/gag reflexes present  GU: Outpatient foley exchanged; new indwelling foley draining tea colored urine   Resolved Hospital Problem list     Assessment & Plan:   Acute toxic metabolic encephalopathy  - s/p extubation 12/31/23 on bipap with precedex   -ABG ordered 4 hours post liberation   2. SEPTIC SHOCK- PRESENT ON ADMISSION -Due E fecaelis bacteremia   -ID consult - currently on ampicillin ,   3. Acute on chronic HFpEF  #CTEPH   Hx: HTN, aortic stenosis, PE, and HLD  - Continuous telemetry monitoring  - Trend troponin until peaked  - BNP pending  - Prn levophed  gtt to maintain map 65 or higher  - Heparin  gtt: dosing per pharmacy  - Cooxy pending  - US  Venous right upper extremity and bilateral lower extremities to rule out DVT  - Will consider heart failure team consultation   #Acute hypercapnic respiratory failure  #AECOPD   #Acute kidney injury suspect secondary to ATN and diuretic therapy  #Hyperkalemia  #Acute on chronic hyponatremia  #Lactic acidosis  - Trend BMP and lactic acid  - Replace electrolytes as indicated  - Strict intake and output  - Urine  osmolality/Na+ and serum osmolality pending  - Shifting measures administered for hyperkalemia with repeat BMP pending @2000   #Pneumonia suspect aspiration  - Trend WBC and monitor fever curve - Trend PCT  - Follow cultures  - Continue ampicillin  -tracheal aspirate - GPCs   #Transaminitis  - Trend hepatic function panel  - Avoid hepatotoxic medications   #Anemia of chronic disease  - Trend CBC - Monitor for s/sx for bleeding  - Transfuse for hgb <7  #Previous right humerus fracture  - Venous US  to r/o VTE  - Continue sling per outpatient ortho recommendations  - Follow-up with outpatient orthopedic  #Hypothyroidism  - TSH and free T4 pending   #Endo - CBG's q4hrs  - Follow hypo/hyperglycemic protocol  - Target range 140 to 180  Best Practice (right click and Reselect all SmartList Selections daily)   Diet/type: NPO DVT prophylaxis systemic heparin  Pressure ulcer(s): N/A GI prophylaxis: H2B Lines: N/A Foley:  Yes, and it is still needed: outpatient foley exchanged 12/27/23 Code Status:  full code Last date of multidisciplinary goals of care discussion [N/A]  Labs   CBC: Recent Labs  Lab 12/27/23 1202 12/28/23 0427 12/29/23 0501 12/30/23 0408 12/31/23 0839  WBC 15.9* 13.0* 12.1* 15.1* 16.5*  NEUTROABS 12.3*  --   --   --  13.8*  HGB 11.2*  9.2* 8.1* 8.0* 8.8*  HCT 36.3* 26.5* 23.0* 23.2* 26.2*  MCV 101.4* 91.1 89.5 89.6 92.9  PLT 151 152 150 179 212    Basic Metabolic Panel: Recent Labs  Lab 12/27/23 1650 12/27/23 1956 12/28/23 0427 12/28/23 1556 12/29/23 0501 12/29/23 1702 12/30/23 0408 12/30/23 1607 12/31/23 0412  NA  --    < > 127*   < > 130* 131* 135 132* 135  K  --    < > 4.6   < > 3.9 3.9 3.8 4.1 3.6  CL  --    < > 91*   < > 98 96* 102 100 102  CO2  --    < > 23   < > 21* 23 26 23 24   GLUCOSE  --    < > 124*   < > 124* 119* 134* 140* 167*  BUN  --    < > 71*   < > 46* 41* 40* 44* 49*  CREATININE  --    < > 3.62*   < > 2.03* 1.92*  1.62* 1.64* 1.47*  CALCIUM  --    < > 8.1*   < > 8.1* 8.8* 9.0 9.1 9.4  MG 2.2  --  2.0  --  2.1  --  2.1  --  2.0  PHOS 6.3*  --  3.8   < > 3.0 3.1 2.9 2.7 2.6   < > = values in this interval not displayed.   GFR: Estimated Creatinine Clearance: 45.3 mL/min (A) (by C-G formula based on SCr of 1.47 mg/dL (H)). Recent Labs  Lab 12/27/23 1202 12/27/23 1650 12/27/23 1651 12/27/23 1956 12/28/23 0021 12/28/23 0427 12/29/23 0501 12/30/23 0408 12/31/23 0839  PROCALCITON  --  19.58  --   --   --  12.67  --   --   --   WBC 15.9*  --   --   --   --  13.0* 12.1* 15.1* 16.5*  LATICACIDVEN 2.4*  --  4.0* 3.6* 2.1*  --   --   --   --     Liver Function Tests: Recent Labs  Lab 12/27/23 1202 12/28/23 0427 12/28/23 1556 12/29/23 0501 12/29/23 1702 12/30/23 0408 12/30/23 1607 12/31/23 0412  AST 143* 72*  --   --   --   --   --   --   ALT 89* 59*  --   --   --   --   --   --   ALKPHOS 80 63  --   --   --   --   --   --   BILITOT 0.9 0.9  --   --   --   --   --   --   PROT 6.8 5.4*  --   --   --   --   --   --   ALBUMIN  2.5* 2.1*  2.0*   < > 1.9* 2.2* 2.2* 2.4* 2.5*   < > = values in this interval not displayed.   Recent Labs  Lab 12/27/23 1202  LIPASE 25   Recent Labs  Lab 12/27/23 1650 12/28/23 0427  AMMONIA 99* 31    ABG    Component Value Date/Time   PHART 7.51 (H) 12/28/2023 0022   PCO2ART 32 12/28/2023 0022   PO2ART 164 (H) 12/28/2023 0022   HCO3 25.5 12/28/2023 0022   ACIDBASEDEF 3.3 (H) 12/27/2023 1659   O2SAT 99.8 12/28/2023 0022     Coagulation Profile:  Recent Labs  Lab 12/27/23 1202  INR 2.2*    Cardiac Enzymes: No results for input(s): CKTOTAL, CKMB, CKMBINDEX, TROPONINI in the last 168 hours.  HbA1C: No results found for: HGBA1C  CBG: Recent Labs  Lab 12/30/23 1628 12/30/23 1938 12/30/23 2335 12/31/23 0343 12/31/23 0744  GLUCAP 136* 127* 138* 149* 175*    Review of Systems:   Unable to assess pt mechanically intubated    Past Medical History:  He,  has a past medical history of Arthritis, childhood infection, Glaucoma, Hypertension, Hypothyroidism, Sinus problem, and Ulnar neuropathy.   Surgical History:   Past Surgical History:  Procedure Laterality Date   APPENDECTOMY     CATARACT EXTRACTION Bilateral    COLONOSCOPY     JOINT REPLACEMENT Right    KNEE ARTHROPLASTY Left 12/24/2020   Procedure: COMPUTER ASSISTED TOTAL KNEE ARTHROPLASTY;  Surgeon: Mardee Lynwood SQUIBB, MD;  Location: [facility] ORS;  Service: Orthopedics;  Laterality: Left;  3RD CASE   KNEE SURGERY Right 10/04/2011   replacement   NM MYOVIEW  ([facility] HX)  07/21/2017   [place]. No ischemia.    RETINAL DETACHMENT REPAIR W/ SCLERAL BUCKLE LE     TEE WITHOUT CARDIOVERSION N/A 04/29/2023   Procedure: TRANSESOPHAGEAL ECHOCARDIOGRAM (TEE);  Surgeon: Hilarie Rocher, MD;  Location: [facility] ORS;  Service: Cardiovascular;  Laterality: N/A;   TONSILLECTOMY       Social History:   reports that he quit smoking about 54 years ago. His smoking use included cigarettes. He started smoking about 60 years ago. He has a 1.5 pack-year smoking history. He has never used smokeless tobacco. He reports that he does not drink alcohol  and does not use drugs.   Family History:  His family history includes Hypertension in his father and mother; Stroke in his father and sister.   Allergies No Known Allergies   Home Medications  Prior to Admission medications   Medication Sig Start Date End Date Taking? Authorizing Provider  albuterol  (VENTOLIN  HFA) 108 (90 Base) MCG/ACT inhaler SMARTSIG:2 Puff(s) By Mouth As Directed    [provider]  aspirin  81 MG EC tablet Take 1 tablet (81 mg total) by mouth daily. Swallow whole. 07/06/21   Gasper Nancyann BRAVO, MD  azelastine  (ASTELIN ) 0.1 % nasal spray Place 1 spray into the nose daily as needed (allergies). 11/09/19   [provider]  brimonidine  (ALPHAGAN ) 0.2 % ophthalmic solution Place 1 drop into both eyes in the morning  and at bedtime. 03/26/15   [provider]  clotrimazole -betamethasone  (LOTRISONE ) cream APPLY TOPICALLY ONCE DAILY Patient not taking: Reported on 03/21/2023 04/05/22   Gasper Nancyann BRAVO, MD  dorzolamide  (TRUSOPT ) 2 % ophthalmic solution Apply to eye. 11/08/21   [provider]  DULoxetine  (CYMBALTA ) 30 MG capsule Take 30 mg by mouth daily. 09/13/23 12/11/23  [provider]  dutasteride  (AVODART ) 0.5 MG capsule Take 1 capsule by mouth once daily 12/23/22   Gasper Nancyann BRAVO, MD  furosemide  (LASIX ) 20 MG tablet Take 20 mg by mouth daily.    [provider]  hydroxychloroquine (PLAQUENIL) 200 MG tablet Take 200 mg by mouth daily. 09/07/23   [provider]  ipratropium (ATROVENT ) 0.06 % nasal spray Place 1 spray into both nostrils 2 (two) times daily as needed for rhinitis. 03/15/18   [provider]  latanoprost  (XALATAN ) 0.005 % ophthalmic solution SMARTSIG:In Eye(s) 03/25/22   [provider]  levocetirizine (XYZAL ) 5 MG tablet Take 5 mg by mouth every evening.    [provider]  levothyroxine  (SYNTHROID ) 150 MCG tablet Take 1 tablet by mouth once daily 12/23/22   [name], [name] E, MD  losartan (COZAAR) 25 MG tablet Take 25 mg by mouth daily.    [provider]  lovastatin  (MEVACOR ) 40 MG tablet Take 1 tablet by mouth once daily 05/09/23   [name], [name] E, MD  Multiple Vitamin (DAILY VITAMIN PO) Take 1 tablet by mouth daily.    [provider]  pregabalin  (LYRICA ) 50 MG capsule Take 1 capsule (50 mg total) by mouth 2 (two) times daily. 09/28/23   Gasper Nancyann BRAVO, MD  vitamin B-12 (CYANOCOBALAMIN) 100 MCG tablet Take 100 mcg by mouth daily. Unsure of dose  is OTC med Patient not taking: Reported on 09/22/2023    [provider]     Critical care provider statement:   Total critical care time: 33 minutes   Performed by: Parris MD   Critical care time was exclusive of separately billable procedures  and treating other patients.   Critical care was necessary to treat or prevent imminent or life-threatening deterioration.   Critical care was time spent personally by me on the following activities: development of treatment plan with patient and/or surrogate as well as nursing, discussions with consultants, evaluation of patient's response to treatment, examination of patient, obtaining history from patient or surrogate, ordering and performing treatments and interventions, ordering and review of laboratory studies, ordering and review of radiographic studies, pulse oximetry and re-evaluation of patient's condition.    Judithe Keetch, M.D.  Pulmonary & Critical Care Medicine

## 2023-12-31 NOTE — Progress Notes (Signed)
 Pt extubated to bipap at this time per MD order. RT and Annabelle Harman, NP at bedside. Daughter also present at bedside. Pt is awake and following commands. He has tolerated SBT well.

## 2023-12-31 NOTE — Consult Note (Signed)
 PHARMACY CONSULT NOTE - ELECTROLYTES  Pharmacy Consult for Electrolyte Monitoring and Replacement   Recent Labs: Potassium (mmol/L)  Date Value  12/31/2023 3.7   Magnesium  (mg/dL)  Date Value  98/95/7974 2.0   Calcium (mg/dL)  Date Value  98/95/7974 9.1   Albumin  (g/dL)  Date Value  98/95/7974 2.6 (L)  09/28/2023 4.0   Phosphorus (mg/dL)  Date Value  98/95/7974 2.4 (L)   Sodium (mmol/L)  Date Value  12/31/2023 134 (L)  09/28/2023 132 (L)   Height: 6' 0.99 (185.4 cm) Weight: 101.9 kg (224 lb 10.4 oz) IBW/kg (Calculated) : 79.88 Estimated Creatinine Clearance: 47.9 mL/min (A) (by C-G formula based on SCr of 1.39 mg/dL (H)).  Assessment  Daniel Carr is a 89 y.o. male presenting with acute on chronic respiratory failure. PMH significant for HTN, hypothyroidism, HFpEF, COPD, aortic stenosis, Hx PE / CTEPH. Pharmacy has been consulted to monitor and replace electrolytes. Patient initiated on CRRT on 12/28/2023   Diet: NPO MIVF: N/A Pertinent medications: N/A  Goal of Therapy: Electrolytes within normal limits  Plan:  --Kcl 10mEq IV x 2 --Patient is now on CRRT. Twice daily renal function panel labs ordered  Thank you for allowing pharmacy to be a part of this patient's care.  Kayla JULIANNA Blew, PharmD Clinical Pharmacist 12/31/2023 4:52 PM

## 2023-12-31 NOTE — Progress Notes (Signed)
 Patient extubated to BIPAP per Dr. Magdalene Molly. No issues with extubation.

## 2024-01-01 LAB — RENAL FUNCTION PANEL
Albumin: 2.5 g/dL — ABNORMAL LOW (ref 3.5–5.0)
Albumin: 2.4 g/dL — ABNORMAL LOW (ref 3.5–5.0)
Anion gap: 8 (ref 5–15)
Anion gap: 9 (ref 5–15)
BUN: 40 mg/dL — ABNORMAL HIGH (ref 8–23)
BUN: 41 mg/dL — ABNORMAL HIGH (ref 8–23)
CO2: 26 mmol/L (ref 22–32)
CO2: 25 mmol/L (ref 22–32)
Calcium: 9.4 mg/dL (ref 8.9–10.3)
Calcium: 9.2 mg/dL (ref 8.9–10.3)
Chloride: 102 mmol/L (ref 98–111)
Chloride: 101 mmol/L (ref 98–111)
Creatinine, Ser: 1.15 mg/dL (ref 0.61–1.24)
Creatinine, Ser: 1.33 mg/dL — ABNORMAL HIGH (ref 0.61–1.24)
GFR, Estimated: >60 mL/min (ref >60)
GFR, Estimated: 52 mL/min — ABNORMAL LOW (ref >60)
Glucose, Bld: 93 mg/dL (ref 70–99)
Glucose, Bld: 98 mg/dL (ref 70–99)
Phosphorus: 2.4 mg/dL — ABNORMAL LOW (ref 2.5–4.6)
Phosphorus: 5.2 mg/dL — ABNORMAL HIGH (ref 2.5–4.6)
Potassium: 3.5 mmol/L (ref 3.5–5.1)
Potassium: 4.1 mmol/L (ref 3.5–5.1)
Sodium: 136 mmol/L (ref 135–145)
Sodium: 135 mmol/L (ref 135–145)

## 2024-01-01 LAB — CBC WITH DIFFERENTIAL/PLATELET
Abs Immature Granulocytes: 0.07 10*3/uL (ref 0.00–0.07)
Basophils Absolute: 0 10*3/uL (ref 0.0–0.1)
Basophils Relative: 0 %
Eosinophils Absolute: 0.1 10*3/uL (ref 0.0–0.5)
Eosinophils Relative: 1 %
HCT: 28.3 % — ABNORMAL LOW (ref 39.0–52.0)
Hemoglobin: 9.1 g/dL — ABNORMAL LOW (ref 13.0–17.0)
Immature Granulocytes: 1 %
Lymphocytes Relative: 8 %
Lymphs Abs: 1 10*3/uL (ref 0.7–4.0)
MCH: 31.5 pg (ref 26.0–34.0)
MCHC: 32.2 g/dL (ref 30.0–36.0)
MCV: 97.9 fL (ref 80.0–100.0)
Monocytes Absolute: 1.2 10*3/uL — ABNORMAL HIGH (ref 0.1–1.0)
Monocytes Relative: 9 %
Neutro Abs: 10.8 10*3/uL — ABNORMAL HIGH (ref 1.7–7.7)
Neutrophils Relative %: 81 %
Platelets: 207 10*3/uL (ref 150–400)
RBC: 2.89 MIL/uL — ABNORMAL LOW (ref 4.22–5.81)
RDW: 14.9 % (ref 11.5–15.5)
WBC: 13.2 10*3/uL — ABNORMAL HIGH (ref 4.0–10.5)
nRBC: 0 % (ref 0.0–0.2)

## 2024-01-01 LAB — CULTURE, RESPIRATORY W GRAM STAIN: Culture: NORMAL

## 2024-01-01 LAB — GLUCOSE, CAPILLARY
Glucose-Capillary: 99 mg/dL (ref 70–99)
Glucose-Capillary: 67 mg/dL — ABNORMAL LOW (ref 70–99)
Glucose-Capillary: 120 mg/dL — ABNORMAL HIGH (ref 70–99)
Glucose-Capillary: 88 mg/dL (ref 70–99)
Glucose-Capillary: 90 mg/dL (ref 70–99)
Glucose-Capillary: 126 mg/dL — ABNORMAL HIGH (ref 70–99)

## 2024-01-01 LAB — CULTURE, BLOOD (ROUTINE X 2): Culture: NO GROWTH

## 2024-01-01 LAB — MAGNESIUM: Magnesium: 2 mg/dL (ref 1.7–2.4)

## 2024-01-01 MED ORDER — MIDODRINE HCL 5 MG PO TABS
5.0000 mg | ORAL_TABLET | Freq: Three times a day (TID) | ORAL | Status: DC
  Administered 2024-01-01 – 2024-01-04 (×8): 5 mg via ORAL
  Filled 2024-01-01 (×8): qty 1

## 2024-01-01 MED ORDER — POLYETHYLENE GLYCOL 3350 17 G PO PACK
17.0000 g | PACK | Freq: Every day | ORAL | Status: DC | PRN
  Administered 2024-01-02 – 2024-01-03 (×2): 17 g via ORAL
  Filled 2024-01-01 (×2): qty 1

## 2024-01-01 MED ORDER — LATANOPROST 0.005 % OP SOLN
1.0000 [drp] | Freq: Every day | OPHTHALMIC | Status: DC
  Administered 2024-01-01 – 2024-01-10 (×10): 1 [drp] via OPHTHALMIC
  Filled 2024-01-01: qty 2.5

## 2024-01-01 MED ORDER — FENTANYL CITRATE PF 50 MCG/ML IJ SOSY
25.0000 ug | PREFILLED_SYRINGE | Freq: Once | INTRAMUSCULAR | Status: AC
  Administered 2024-01-01: 25 ug via INTRAVENOUS
  Filled 2024-01-01: qty 1

## 2024-01-01 MED ORDER — INSULIN ASPART 100 UNIT/ML IJ SOLN: 0.0000 [IU] | Freq: Three times a day (TID) | INTRAMUSCULAR | Status: DC

## 2024-01-01 MED ORDER — INSULIN ASPART 100 UNIT/ML IJ SOLN: 0.0000 [IU] | Freq: Every day | INTRAMUSCULAR | Status: DC

## 2024-01-01 MED ORDER — ALTEPLASE 2 MG IJ SOLR
2.0000 mg | Freq: Once | INTRAMUSCULAR | Status: AC
  Administered 2024-01-01: 2 mg via INTRAVENOUS_CENTRAL
  Filled 2024-01-01: qty 2

## 2024-01-01 MED ORDER — RENA-VITE PO TABS
1.0000 | ORAL_TABLET | Freq: Every day | ORAL | Status: DC
  Administered 2024-01-01 – 2024-01-08 (×8): 1 via ORAL
  Filled 2024-01-01 (×8): qty 1

## 2024-01-01 MED ORDER — OXYCODONE HCL 5 MG PO TABS
5.0000 mg | ORAL_TABLET | Freq: Four times a day (QID) | ORAL | Status: DC | PRN
  Administered 2024-01-01 – 2024-01-10 (×12): 5 mg via ORAL
  Filled 2024-01-01 (×13): qty 1

## 2024-01-01 MED ORDER — DOCUSATE SODIUM 100 MG PO CAPS
100.0000 mg | ORAL_CAPSULE | Freq: Two times a day (BID) | ORAL | Status: DC
  Administered 2024-01-01 – 2024-01-04 (×5): 100 mg via ORAL
  Filled 2024-01-01 (×5): qty 1

## 2024-01-01 MED ORDER — BRIMONIDINE TARTRATE 0.2 % OP SOLN
1.0000 [drp] | Freq: Three times a day (TID) | OPHTHALMIC | Status: DC
  Administered 2024-01-01 – 2024-01-11 (×30): 1 [drp] via OPHTHALMIC
  Filled 2024-01-01: qty 5

## 2024-01-01 MED ORDER — DEXTROSE 50 % IV SOLN
50.0000 mL | Freq: Once | INTRAVENOUS | Status: AC
  Administered 2024-01-01: 50 mL via INTRAVENOUS

## 2024-01-01 MED ORDER — STERILE WATER FOR INJECTION IJ SOLN
INTRAMUSCULAR | Status: AC
  Filled 2024-01-01: qty 20

## 2024-01-01 MED ORDER — ALTEPLASE 2 MG IJ SOLR
2.0000 mg | Freq: Once | INTRAMUSCULAR | Status: AC
  Administered 2024-01-01: 2 mg
  Filled 2024-01-01: qty 2

## 2024-01-01 MED ORDER — DEXTROSE 50 % IV SOLN
INTRAVENOUS | Status: AC
  Administered 2024-01-01: 50 mL
  Filled 2024-01-01: qty 50

## 2024-01-01 MED ORDER — FENTANYL CITRATE PF 50 MCG/ML IJ SOSY
12.5000 ug | PREFILLED_SYRINGE | INTRAMUSCULAR | Status: DC | PRN
  Administered 2024-01-01: 12.5 ug via INTRAVENOUS
  Filled 2024-01-01: qty 1

## 2024-01-01 MED ORDER — POTASSIUM PHOSPHATES 15 MMOLE/5ML IV SOLN
30.0000 mmol | Freq: Once | INTRAVENOUS | Status: AC
  Administered 2024-01-01: 30 mmol via INTRAVENOUS
  Filled 2024-01-01: qty 10

## 2024-01-01 NOTE — Consult Note (Signed)
 PHARMACY CONSULT NOTE - ELECTROLYTES  Pharmacy Consult for Electrolyte Monitoring and Replacement   Recent Labs: Potassium (mmol/L)  Date Value  01/01/2024 3.5   Magnesium  (mg/dL)  Date Value  98/94/7974 2.0   Calcium (mg/dL)  Date Value  98/94/7974 9.4   Albumin  (g/dL)  Date Value  98/94/7974 2.5 (L)  09/28/2023 4.0   Phosphorus (mg/dL)  Date Value  98/94/7974 2.4 (L)   Sodium (mmol/L)  Date Value  01/01/2024 136  09/28/2023 132 (L)   Height: 6' 0.99 (185.4 cm) Weight: 96.1 kg (211 lb 13.8 oz) IBW/kg (Calculated) : 79.88 Estimated Creatinine Clearance: 56.3 mL/min (by C-G formula based on SCr of 1.15 mg/dL).  Assessment  Daniel Carr is Daniel 89 y.o. male presenting with acute on chronic respiratory failure. PMH significant for HTN, hypothyroidism, HFpEF, COPD, aortic stenosis, Hx PE / CTEPH. Pharmacy has been consulted to monitor and replace electrolytes. Patient initiated on CRRT on 12/28/2023   Diet: NPO MIVF: N/Daniel Pertinent medications: N/Daniel  Goal of Therapy: Electrolytes within normal limits  Plan:  --Phos 2.4: Kphos IV x 1 --Patient is now on CRRT. Twice daily renal function panel labs ordered  Thank you for allowing pharmacy to be Daniel part of this patient's care.  Daniel Carr Daniel Carr, PharmD Clinical Pharmacist 01/01/2024 7:55 AM

## 2024-01-01 NOTE — Progress Notes (Signed)
 Attempted to start CRRT several times with a couple filter changes and assistance from Bath C. And Katie L. Without success.  Dr. Truitt Merle and order to hold CRRT for the night. Family aware.

## 2024-01-01 NOTE — Plan of Care (Signed)
  Problem: Education: Goal: Knowledge of General Education information will improve Description: Including pain rating scale, medication(s)/side effects and non-pharmacologic comfort measures Outcome: Progressing   Problem: Clinical Measurements: Goal: Ability to maintain clinical measurements within normal limits will improve Outcome: Progressing Goal: Will remain free from infection Outcome: Not Progressing Goal: Diagnostic test results will improve Outcome: Progressing Goal: Respiratory complications will improve Outcome: Progressing Goal: Cardiovascular complication will be avoided Outcome: Progressing   Problem: Activity: Goal: Risk for activity intolerance will decrease Outcome: Progressing   Problem: Nutrition: Goal: Adequate nutrition will be maintained Outcome: Not Progressing   Problem: Coping: Goal: Level of anxiety will decrease Outcome: Progressing   Problem: Elimination: Goal: Will not experience complications related to bowel motility Outcome: Not Progressing Goal: Will not experience complications related to urinary retention Outcome: Progressing   Problem: Pain Management: Goal: General experience of comfort will improve Outcome: Progressing   Problem: Safety: Goal: Ability to remain free from injury will improve Outcome: Progressing   Problem: Skin Integrity: Goal: Risk for impaired skin integrity will decrease Outcome: Progressing

## 2024-01-01 NOTE — Progress Notes (Signed)
 Lanora Manis, NP notified that patient continues to c/o right arm/shoulder pain not relieved by the PRN oxycodone 5mg  q6hrs; patient continues to rate pain 10/10. Patient is requesting something to help with pain. Awaiting response.

## 2024-01-01 NOTE — Progress Notes (Signed)
 NAME:  Daniel Carr, MRN:  [id number], DOB:  1935-01-22, LOS: 5 ADMISSION DATE:  12/27/2023, CONSULTATION DATE: 12/27/2023 REFERRING MD: Dr. Dicky, CHIEF COMPLAINT: Shortness of Breath    History of Present Illness:  This is an 89 yo male who presented to [facility] ER via EMS on 12/31 from [facility] with altered mental status and acute respiratory failure.  Pts family reports he has had worsening bilateral lower extremity edema and decreased urinary output.  His PCP increased his outpatient lasix  dose to 40 mg daily, however his urinary output did not improve.  Pts granddaughter reports he also c/o chills and fevers.  He was recently hospitalized at [facility] on 12/02/23 with a right humerus fracture from a mechanical fall on 12/3 and evaluated by ortho who recommended a sling for comfort and outpatient follow-up.  However, during presentation he was found to have progressive dyspnea; severe pulmonary hypertension; hyponatremia; acute blood loss anemia suspected secondary to right chest wall hematoma and acute kidney injury on chronic kidney disease requiring hospital admission.  Following treatment he was discharged on 12/17 to [facility] for rehab and instructed not to resume his outpatient apixaban  until 12/18.  His daughter reports the pts apixaban  has been resumed as instructed.    ED Course  Upon arrival to the ER pt in severe respiratory distress, hypotensive, and minimally responsive requiring mechanical intubation.  Significant lab results were: Na+ 125/K+ 5.9/chloride 86/BUN 72/creatinine 4.46/calcium 8.7/albumin  2.5/AST 143/ALT 89/troponin 550/lactic acid 2.4/wbc 15.9/hgb 11.2/PT 25.0/INR 2.2/APTT 48.  CXR concerning for possible small right pleural effusion.  He met sepsis criteria and received: 2L saline bolus; vancomycin , and aztreonam .  Despite iv fluid resuscitation pt remained hypotensive requiring levophed  gtt.  PCCM team contacted for ICU admission.   12/27/22- patient with improved  mentation post CRRT.  Reducing his sedatives on MV today.  Sepsis bacteremia, ID on case.  He is oozing blood from IV sites, will order DIC panel and pathology slide review.   12/29/23- patient following verbal communication this am, remains in anasarca. Bacteremia being treated. He has CRRT with now removal of fluid at 100/hr.  Starting NG feeds.  Tracheal aspirate with GPCs in pairs.  ID on case continues with ampicillin  reducing steroids and off levophed  today. 12/30/23- patient for SBT today.  Last day of steroids today. Failed SBT today.  S/p iD evaluation 12/31/23-patient liberated from MV today on BIPAP, required precedex  for aggitation. Remain on ampicillin . Met with daughter and granddaughter few times to review findings and medical plan.  01/01/24- patient improved overnight, weaned of bipap on hfnc, remains edematous but improved on CRRT with 100/h UF, met with family reviewed medical plan, ordering PT/OT for tommorow. Remains on  heparin  with CTEPH/chronic DVTs.   Pertinent  Medical History  Arthritis  Glaucoma HTN  Hypothyroidism  HLD Hyponatremia  HFpEF  Right Humerus Fracture secondary to mechanical fall (11/29/2023) Anemia of Chronic Disease  COPD Aortic Stenosis   MGUS PE in 2022 previously on apixaban  but pt self discontinued restarted on 12/14/23 due to CTEPH   Significant Hospital Events: Including procedures, antibiotic start and stop dates in addition to other pertinent events   12/31: Pt admitted with acute hypercapnic respiratory failure, septic shock, AECOPD, pneumonia suspect aspiration, acute kidney injury, acute on chronic hyponatremia, transaminitis, and acute metabolic encephalopathy mechanical intubation    Interim History / Subjective:  Pt sedated with fentanyl  gtt at 25 mcg/hr and mechanically intubated requiring levophed  gtt @4  mcg/min to maintain map 65 or higher.  Objective   Blood pressure 117/61, pulse 79, temperature 97.9 F (36.6 C), temperature  source Axillary, resp. rate (!) 32, height 6' 0.99 (1.854 m), weight 96.1 kg, SpO2 97%.    FiO2 (%):  [24 %-35 %] 35 %   Intake/Output Summary (Last 24 hours) at 01/01/2024 1033 Last data filed at 01/01/2024 1000 Gross per 24 hour  Intake 1198.37 ml  Output 3813 ml  Net -2614.63 ml   Filed Weights   12/30/23 0416 12/31/23 0411 01/01/24 0447  Weight: 106.4 kg 101.9 kg 96.1 kg   Examination: General: Acute on chronically-ill appearing male, on hfnc HENT: Supple, no JVD present  Lungs: Diminished throughout, even, non labored  Cardiovascular: NSR, s1s2, no r/g, 2+ radial/1+ distal pulses, 3+ bilateral lower extremity pitting edema  Abdomen: +BS x4, obese, soft, non distended  Extremities: Right arm sling present secondary to recent right humerus fracture with 2+ edema  Skin: Intact no rashes/lesions/pressure ulcers present  Neuro: no FND grossly, but remains sedated on precedex .  corneal/gag reflexes present  GU: Outpatient foley exchanged; new indwelling foley draining tea colored urine   Resolved Hospital Problem list     Assessment & Plan:   Acute toxic metabolic encephalopathy  - s/p extubation 12/31/23 on bipap with precedex    On HFNC today   2. SEPTIC SHOCK- PRESENT ON ADMISSION -Due E fecaelis bacteremia   -ID consult - currently on ampicillin ,   3. Acute on chronic HFpEF  #CTEPH   Hx: HTN, aortic stenosis, PE, and HLD  - Continuous telemetry monitoring  - Trend troponin until peaked  - BNP pending  - Prn levophed  gtt to maintain map 65 or higher  - Heparin   transitioned to po eliquis  home dose   #Acute hypercapnic respiratory failure  #AECOPD   #Acute kidney injury suspect secondary to ATN and diuretic therapy  #Hyperkalemia  #Acute on chronic hyponatremia  #Lactic acidosis  - Trend BMP and lactic acid  - Replace electrolytes as indicated  - Strict intake and output    #Pneumonia suspect due to enterococcus - Trend WBC and monitor fever curve - Trend  PCT  - Follow cultures  - Continue ampicillin  -tracheal aspirate - GPCs   #Transaminitis  - Trend hepatic function panel  - Avoid hepatotoxic medications   #Anemia of chronic disease  - Trend CBC - Monitor for s/sx for bleeding  - Transfuse for hgb <7  #Previous right humerus fracture  - Venous US  to r/o VTE  - Continue sling per outpatient ortho recommendations  - Follow-up with outpatient orthopedic  #Hypothyroidism  - TSH and free T4 pending   #Endo - CBG's q4hrs  - Follow hypo/hyperglycemic protocol  - Target range 140 to 180  Best Practice (right click and Reselect all SmartList Selections daily)   Diet/type: NPO DVT prophylaxis systemic heparin  Pressure ulcer(s): N/A GI prophylaxis: H2B Lines: N/A Foley:  Yes, and it is still needed: outpatient foley exchanged 12/27/23 Code Status:  full code Last date of multidisciplinary goals of care discussion [N/A]  Labs   CBC: Recent Labs  Lab 12/27/23 1202 12/28/23 0427 12/29/23 0501 12/30/23 0408 12/31/23 0839 01/01/24 0428  WBC 15.9* 13.0* 12.1* 15.1* 16.5* 13.2*  NEUTROABS 12.3*  --   --   --  13.8* 10.8*  HGB 11.2* 9.2* 8.1* 8.0* 8.8* 9.1*  HCT 36.3* 26.5* 23.0* 23.2* 26.2* 28.3*  MCV 101.4* 91.1 89.5 89.6 92.9 97.9  PLT 151 152 150 179 212 207    Basic Metabolic Panel: Recent  Labs  Lab 12/28/23 0427 12/28/23 1556 12/29/23 0501 12/29/23 1702 12/30/23 0408 12/30/23 1607 12/31/23 0412 12/31/23 1605 01/01/24 0428  NA 127*   < > 130*   < > 135 132* 135 134* 136  K 4.6   < > 3.9   < > 3.8 4.1 3.6 3.7 3.5  CL 91*   < > 98   < > 102 100 102 101 102  CO2 23   < > 21*   < > 26 23 24 23 26   GLUCOSE 124*   < > 124*   < > 134* 140* 167* 115* 93  BUN 71*   < > 46*   < > 40* 44* 49* 48* 40*  CREATININE 3.62*   < > 2.03*   < > 1.62* 1.64* 1.47* 1.39* 1.15  CALCIUM 8.1*   < > 8.1*   < > 9.0 9.1 9.4 9.1 9.4  MG 2.0  --  2.1  --  2.1  --  2.0  --  2.0  PHOS 3.8   < > 3.0   < > 2.9 2.7 2.6 2.4* 2.4*   < >  = values in this interval not displayed.   GFR: Estimated Creatinine Clearance: 56.3 mL/min (by C-G formula based on SCr of 1.15 mg/dL). Recent Labs  Lab 12/27/23 1202 12/27/23 1650 12/27/23 1651 12/27/23 1956 12/28/23 0021 12/28/23 0427 12/29/23 0501 12/30/23 0408 12/31/23 0839 01/01/24 0428  PROCALCITON  --  19.58  --   --   --  12.67  --   --   --   --   WBC 15.9*  --   --   --   --  13.0* 12.1* 15.1* 16.5* 13.2*  LATICACIDVEN 2.4*  --  4.0* 3.6* 2.1*  --   --   --   --   --     Liver Function Tests: Recent Labs  Lab 12/27/23 1202 12/28/23 0427 12/28/23 1556 12/30/23 0408 12/30/23 1607 12/31/23 0412 12/31/23 1605 01/01/24 0428  AST 143* 72*  --   --   --   --   --   --   ALT 89* 59*  --   --   --   --   --   --   ALKPHOS 80 63  --   --   --   --   --   --   BILITOT 0.9 0.9  --   --   --   --   --   --   PROT 6.8 5.4*  --   --   --   --   --   --   ALBUMIN  2.5* 2.1*  2.0*   < > 2.2* 2.4* 2.5* 2.6* 2.5*   < > = values in this interval not displayed.   Recent Labs  Lab 12/27/23 1202  LIPASE 25   Recent Labs  Lab 12/27/23 1650 12/28/23 0427  AMMONIA 99* 31    ABG    Component Value Date/Time   PHART 7.42 12/31/2023 1556   PCO2ART 42 12/31/2023 1556   PO2ART 105 12/31/2023 1556   HCO3 27.2 12/31/2023 1556   ACIDBASEDEF 3.3 (H) 12/27/2023 1659   O2SAT 98.8 12/31/2023 1556     Coagulation Profile: Recent Labs  Lab 12/27/23 1202  INR 2.2*    Cardiac Enzymes: No results for input(s): CKTOTAL, CKMB, CKMBINDEX, TROPONINI in the last 168 hours.  HbA1C: No results found for: HGBA1C  CBG: Recent Labs  Lab 12/31/23 1942  12/31/23 2337 01/01/24 0322 01/01/24 0911 01/01/24 1010  GLUCAP 103* 95 99 67* 120*    Review of Systems:   Unable to assess pt mechanically intubated   Past Medical History:  He,  has a past medical history of Arthritis, childhood infection, Glaucoma, Hypertension, Hypothyroidism, Sinus problem, and Ulnar  neuropathy.   Surgical History:   Past Surgical History:  Procedure Laterality Date   APPENDECTOMY     CATARACT EXTRACTION Bilateral    COLONOSCOPY     JOINT REPLACEMENT Right    KNEE ARTHROPLASTY Left 12/24/2020   Procedure: COMPUTER ASSISTED TOTAL KNEE ARTHROPLASTY;  Surgeon: Mardee Lynwood SQUIBB, MD;  Location: [facility] ORS;  Service: Orthopedics;  Laterality: Left;  3RD CASE   KNEE SURGERY Right 10/04/2011   replacement   NM MYOVIEW  ([facility] HX)  07/21/2017   [place]. No ischemia.    RETINAL DETACHMENT REPAIR W/ SCLERAL BUCKLE LE     TEE WITHOUT CARDIOVERSION N/A 04/29/2023   Procedure: TRANSESOPHAGEAL ECHOCARDIOGRAM (TEE);  Surgeon: Hilarie Rocher, MD;  Location: [facility] ORS;  Service: Cardiovascular;  Laterality: N/A;   TONSILLECTOMY       Social History:   reports that he quit smoking about 54 years ago. His smoking use included cigarettes. He started smoking about 60 years ago. He has a 1.5 pack-year smoking history. He has never used smokeless tobacco. He reports that he does not drink alcohol  and does not use drugs.   Family History:  His family history includes Hypertension in his father and mother; Stroke in his father and sister.   Allergies No Known Allergies   Home Medications  Prior to Admission medications   Medication Sig Start Date End Date Taking? Authorizing Provider  albuterol  (VENTOLIN  HFA) 108 (90 Base) MCG/ACT inhaler SMARTSIG:2 Puff(s) By Mouth As Directed    [provider]  aspirin  81 MG EC tablet Take 1 tablet (81 mg total) by mouth daily. Swallow whole. 07/06/21   Gasper Nancyann BRAVO, MD  azelastine  (ASTELIN ) 0.1 % nasal spray Place 1 spray into the nose daily as needed (allergies). 11/09/19   [provider]  brimonidine  (ALPHAGAN ) 0.2 % ophthalmic solution Place 1 drop into both eyes in the morning and at bedtime. 03/26/15   [provider]  clotrimazole -betamethasone  (LOTRISONE ) cream APPLY TOPICALLY ONCE DAILY Patient not taking: Reported on  03/21/2023 04/05/22   Gasper Nancyann BRAVO, MD  dorzolamide  (TRUSOPT ) 2 % ophthalmic solution Apply to eye. 11/08/21   [provider]  DULoxetine  (CYMBALTA ) 30 MG capsule Take 30 mg by mouth daily. 09/13/23 12/11/23  [provider]  dutasteride  (AVODART ) 0.5 MG capsule Take 1 capsule by mouth once daily 12/23/22   Gasper Nancyann BRAVO, MD  furosemide  (LASIX ) 20 MG tablet Take 20 mg by mouth daily.    [provider]  hydroxychloroquine (PLAQUENIL) 200 MG tablet Take 200 mg by mouth daily. 09/07/23   [provider]  ipratropium (ATROVENT ) 0.06 % nasal spray Place 1 spray into both nostrils 2 (two) times daily as needed for rhinitis. 03/15/18   [provider]  latanoprost  (XALATAN ) 0.005 % ophthalmic solution SMARTSIG:In Eye(s) 03/25/22   [provider]  levocetirizine (XYZAL ) 5 MG tablet Take 5 mg by mouth every evening.    [provider]  levothyroxine  (SYNTHROID ) 150 MCG tablet Take 1 tablet by mouth once daily 12/23/22   [name], [name] E, MD  losartan (COZAAR) 25 MG tablet Take 25 mg by mouth daily.    [provider]  lovastatin  (MEVACOR ) 40  MG tablet Take 1 tablet by mouth once daily 05/09/23   [name], [name] E, MD  Multiple Vitamin (DAILY VITAMIN PO) Take 1 tablet by mouth daily.    [provider]  pregabalin  (LYRICA ) 50 MG capsule Take 1 capsule (50 mg total) by mouth 2 (two) times daily. 09/28/23   Gasper Nancyann BRAVO, MD  vitamin B-12 (CYANOCOBALAMIN) 100 MCG tablet Take 100 mcg by mouth daily. Unsure of dose  is OTC med Patient not taking: Reported on 09/22/2023    [provider]     Critical care provider statement:   Total critical care time: 33 minutes   Performed by: Parris MD   Critical care time was exclusive of separately billable procedures and treating other patients.   Critical care was necessary to treat or prevent imminent or life-threatening deterioration.   Critical care was time spent  personally by me on the following activities: development of treatment plan with patient and/or surrogate as well as nursing, discussions with consultants, evaluation of patient's response to treatment, examination of patient, obtaining history from patient or surrogate, ordering and performing treatments and interventions, ordering and review of laboratory studies, ordering and review of radiographic studies, pulse oximetry and re-evaluation of patient's condition.    Kennet Mccort, M.D.  Pulmonary & Critical Care Medicine

## 2024-01-01 NOTE — Plan of Care (Signed)
  Problem: Clinical Measurements: Goal: Ability to maintain clinical measurements within normal limits will improve Outcome: Progressing Goal: Will remain free from infection Outcome: Progressing Goal: Diagnostic test results will improve Outcome: Progressing Goal: Respiratory complications will improve Outcome: Progressing Goal: Cardiovascular complication will be avoided Outcome: Progressing   Problem: Nutrition: Goal: Adequate nutrition will be maintained Outcome: Progressing   Problem: Pain Management: Goal: General experience of comfort will improve Outcome: Progressing   Problem: Safety: Goal: Ability to remain free from injury will improve Outcome: Progressing   Problem: Metabolic: Goal: Ability to maintain appropriate glucose levels will improve Outcome: Progressing   Problem: Skin Integrity: Goal: Risk for impaired skin integrity will decrease Outcome: Progressing   Problem: Tissue Perfusion: Goal: Adequacy of tissue perfusion will improve Outcome: Progressing

## 2024-01-01 NOTE — Progress Notes (Signed)
 [facility]  PROGRESS NOTE   Subjective:   Patient is more awake.  CRRT in progress.  -2.4 L yesterday.  Objective:  Vital signs: Blood pressure 93/68, pulse 79, temperature 98.1 F (36.7 C), temperature source Oral, resp. rate (!) 35, height 6' 0.99 (1.854 m), weight 96.1 kg, SpO2 98%.  Intake/Output Summary (Last 24 hours) at 01/01/2024 1351 Last data filed at 01/01/2024 1200 Gross per 24 hour  Intake 873.88 ml  Output 3342 ml  Net -2468.12 ml   Filed Weights   12/30/23 0416 12/31/23 0411 01/01/24 0447  Weight: 106.4 kg 101.9 kg 96.1 kg     Physical Exam: General:  No acute distress  Head:  Normocephalic, atraumatic. Moist oral mucosal membranes  Eyes:  Anicteric  Neck:  Supple  Lungs:   Clear to auscultation, normal effort  Heart:  S1S2 no rubs  Abdomen:   Soft, nontender, bowel sounds present  Extremities:  peripheral edema.  Neurologic:  Awake, alert, following commands  Skin:  No lesions  Access:     Basic Metabolic Panel: Recent Labs  Lab 12/28/23 0427 12/28/23 1556 12/29/23 0501 12/29/23 1702 12/30/23 0408 12/30/23 1607 12/31/23 0412 12/31/23 1605 01/01/24 0428  NA 127*   < > 130*   < > 135 132* 135 134* 136  K 4.6   < > 3.9   < > 3.8 4.1 3.6 3.7 3.5  CL 91*   < > 98   < > 102 100 102 101 102  CO2 23   < > 21*   < > 26 23 24 23 26   GLUCOSE 124*   < > 124*   < > 134* 140* 167* 115* 93  BUN 71*   < > 46*   < > 40* 44* 49* 48* 40*  CREATININE 3.62*   < > 2.03*   < > 1.62* 1.64* 1.47* 1.39* 1.15  CALCIUM 8.1*   < > 8.1*   < > 9.0 9.1 9.4 9.1 9.4  MG 2.0  --  2.1  --  2.1  --  2.0  --  2.0  PHOS 3.8   < > 3.0   < > 2.9 2.7 2.6 2.4* 2.4*   < > = values in this interval not displayed.   GFR: Estimated Creatinine Clearance: 56.3 mL/min (by C-G formula based on SCr of 1.15 mg/dL).  Liver Function Tests: Recent Labs  Lab 12/27/23 1202 12/28/23 0427 12/28/23 1556 12/30/23 0408 12/30/23 1607 12/31/23 0412 12/31/23 1605 01/01/24 0428   AST 143* 72*  --   --   --   --   --   --   ALT 89* 59*  --   --   --   --   --   --   ALKPHOS 80 63  --   --   --   --   --   --   BILITOT 0.9 0.9  --   --   --   --   --   --   PROT 6.8 5.4*  --   --   --   --   --   --   ALBUMIN  2.5* 2.1*  2.0*   < > 2.2* 2.4* 2.5* 2.6* 2.5*   < > = values in this interval not displayed.   Recent Labs  Lab 12/27/23 1202  LIPASE 25   Recent Labs  Lab 12/27/23 1650 12/28/23 0427  AMMONIA 99* 31    CBC: Recent Labs  Lab  12/27/23 1202 12/28/23 0427 12/29/23 0501 12/30/23 0408 12/31/23 0839 01/01/24 0428  WBC 15.9* 13.0* 12.1* 15.1* 16.5* 13.2*  NEUTROABS 12.3*  --   --   --  13.8* 10.8*  HGB 11.2* 9.2* 8.1* 8.0* 8.8* 9.1*  HCT 36.3* 26.5* 23.0* 23.2* 26.2* 28.3*  MCV 101.4* 91.1 89.5 89.6 92.9 97.9  PLT 151 152 150 179 212 207     HbA1C: No results found for: HGBA1C  Urinalysis: No results for input(s): COLORURINE, LABSPEC, PHURINE, GLUCOSEU, HGBUR, BILIRUBINUR, KETONESUR, PROTEINUR, UROBILINOGEN, NITRITE, LEUKOCYTESUR in the last 72 hours.  Invalid input(s): APPERANCEUR    Imaging: No results found.   Medications:    ampicillin  (OMNIPEN) IV Stopped (01/01/24 0521)   dexmedetomidine  (PRECEDEX ) IV infusion Stopped (01/01/24 0915)   norepinephrine  (LEVOPHED ) Adult infusion 1 mcg/min (01/01/24 0700)   potassium PHOSPHATE  30 mmol in dextrose  5 % 250 mL infusion 30 mmol (01/01/24 0941)   PrismaSol  BGK 2/3.5 400 mL/hr at 01/01/24 0647   PrismaSol  BGK 2/3.5 400 mL/hr at 01/01/24 9350   PrismaSol  BGK 2/3.5 1,000 mL/hr at 01/01/24 9356    brimonidine   1 drop Both Eyes TID   Chlorhexidine  Gluconate Cloth  6 each Topical Daily   docusate  100 mg Per Tube BID   dorzolamide   1 drop Left Eye TID   famotidine   20 mg Per Tube Daily   heparin  injection (subcutaneous)  5,000 Units Subcutaneous Q8H   insulin  aspart  0-6 Units Subcutaneous Q4H   ipratropium-albuterol   3 mL Nebulization Q6H   multivitamin  1  tablet Per Tube QHS   mouth rinse  15 mL Mouth Rinse 4 times per day   polyethylene glycol  17 g Per Tube Daily    Assessment/ Plan:     89 y.o. male with past medical history of hypertension, hypothyroidism, neuropathy, COPD, MGUS, history of PE on apixaban , diastolic heart failure, hyperlipidemia who was admitted on 12/27/2023 for acute hypoxic respiratory failure.   1.  Acute kidney injury/acute metabolic acidosis/hyperkalemia/hyponatremia.  Baseline creatinine 0.9 on 12/13/2023.  Update:  Serum electrolytes improved with CRRT.  Continue CRRT with current parameters of UF target of 100 cc/h.  Will continue to monitor urine output.  Renal indices have significantly improved.   2.  Acute respiratory failure.  Patient is now extubated and is on BiPAP.     3.  Hypotension secondary to systemic shock from sepsis and volume depletion.  Norepinephrine  being weaned down.  4.  Septic shock/pneumonia: Patient has been on ampicillin .  5.  Congestive heart failure.  Renal indices are stable.  Once blood pressure stabilizes we can stop CRRT and continue intravenous diuretics.   Spoke to family at bedside. Overall prognosis poor. Labs and medications reviewed. Will continue to follow along with you.   LOS: 5 Vianka Ertel, MD [facility] 1/5/20251:51 PM

## 2024-01-01 NOTE — Progress Notes (Signed)
 Patient is currently sleeping with eyes closed. Daughter is at bedside and has requested to let the patient sleep if was found sleeping. One time order of fentanyl received for pain, will continue to monitor and reassess patients pain.

## 2024-01-01 NOTE — Progress Notes (Signed)
 CRRT filter clotted. Unable to return blood.

## 2024-01-02 DIAGNOSIS — A419 Sepsis, unspecified organism: Secondary | ICD-10-CM | POA: Diagnosis not present

## 2024-01-02 DIAGNOSIS — B952 Enterococcus as the cause of diseases classified elsewhere: Secondary | ICD-10-CM

## 2024-01-02 DIAGNOSIS — I2724 Chronic thromboembolic pulmonary hypertension: Secondary | ICD-10-CM | POA: Diagnosis not present

## 2024-01-02 DIAGNOSIS — N179 Acute kidney failure, unspecified: Secondary | ICD-10-CM

## 2024-01-02 DIAGNOSIS — J9601 Acute respiratory failure with hypoxia: Secondary | ICD-10-CM | POA: Diagnosis not present

## 2024-01-02 DIAGNOSIS — R6521 Severe sepsis with septic shock: Secondary | ICD-10-CM

## 2024-01-02 DIAGNOSIS — G9341 Metabolic encephalopathy: Secondary | ICD-10-CM | POA: Diagnosis not present

## 2024-01-02 DIAGNOSIS — R7881 Bacteremia: Secondary | ICD-10-CM | POA: Diagnosis not present

## 2024-01-02 LAB — GLUCOSE, CAPILLARY
Glucose-Capillary: 100 mg/dL — ABNORMAL HIGH (ref 70–99)
Glucose-Capillary: 120 mg/dL — ABNORMAL HIGH (ref 70–99)
Glucose-Capillary: 127 mg/dL — ABNORMAL HIGH (ref 70–99)
Glucose-Capillary: 88 mg/dL (ref 70–99)
Glucose-Capillary: 95 mg/dL (ref 70–99)

## 2024-01-02 LAB — RENAL FUNCTION PANEL
Albumin: 2.3 g/dL — ABNORMAL LOW (ref 3.5–5.0)
Albumin: 2.5 g/dL — ABNORMAL LOW (ref 3.5–5.0)
Anion gap: 7 (ref 5–15)
Anion gap: 13 (ref 5–15)
BUN: 49 mg/dL — ABNORMAL HIGH (ref 8–23)
BUN: 50 mg/dL — ABNORMAL HIGH (ref 8–23)
CO2: 26 mmol/L (ref 22–32)
CO2: 20 mmol/L — ABNORMAL LOW (ref 22–32)
Calcium: 8.7 mg/dL — ABNORMAL LOW (ref 8.9–10.3)
Calcium: 8.9 mg/dL (ref 8.9–10.3)
Chloride: 104 mmol/L (ref 98–111)
Chloride: 102 mmol/L (ref 98–111)
Creatinine, Ser: 1.75 mg/dL — ABNORMAL HIGH (ref 0.61–1.24)
Creatinine, Ser: 1.83 mg/dL — ABNORMAL HIGH (ref 0.61–1.24)
GFR, Estimated: 37 mL/min — ABNORMAL LOW (ref >60)
GFR, Estimated: 35 mL/min — ABNORMAL LOW (ref >60)
Glucose, Bld: 111 mg/dL — ABNORMAL HIGH (ref 70–99)
Glucose, Bld: 117 mg/dL — ABNORMAL HIGH (ref 70–99)
Phosphorus: 4.6 mg/dL (ref 2.5–4.6)
Phosphorus: 4.5 mg/dL (ref 2.5–4.6)
Potassium: 4.1 mmol/L (ref 3.5–5.1)
Potassium: 4.6 mmol/L (ref 3.5–5.1)
Sodium: 137 mmol/L (ref 135–145)
Sodium: 135 mmol/L (ref 135–145)

## 2024-01-02 LAB — CBC WITH DIFFERENTIAL/PLATELET
Abs Immature Granulocytes: 0.11 10*3/uL — ABNORMAL HIGH (ref 0.00–0.07)
Basophils Absolute: 0 10*3/uL (ref 0.0–0.1)
Basophils Relative: 0 %
Eosinophils Absolute: 0.1 10*3/uL (ref 0.0–0.5)
Eosinophils Relative: 1 %
HCT: 27.4 % — ABNORMAL LOW (ref 39.0–52.0)
Hemoglobin: 8.7 g/dL — ABNORMAL LOW (ref 13.0–17.0)
Immature Granulocytes: 1 %
Lymphocytes Relative: 11 %
Lymphs Abs: 1.4 10*3/uL (ref 0.7–4.0)
MCH: 31.8 pg (ref 26.0–34.0)
MCHC: 31.8 g/dL (ref 30.0–36.0)
MCV: 100 fL (ref 80.0–100.0)
Monocytes Absolute: 1.1 10*3/uL — ABNORMAL HIGH (ref 0.1–1.0)
Monocytes Relative: 9 %
Neutro Abs: 10 10*3/uL — ABNORMAL HIGH (ref 1.7–7.7)
Neutrophils Relative %: 78 %
Platelets: 205 10*3/uL (ref 150–400)
RBC: 2.74 MIL/uL — ABNORMAL LOW (ref 4.22–5.81)
RDW: 15.6 % — ABNORMAL HIGH (ref 11.5–15.5)
WBC: 12.8 10*3/uL — ABNORMAL HIGH (ref 4.0–10.5)
nRBC: 0 % (ref 0.0–0.2)

## 2024-01-02 LAB — HEMOGLOBIN A1C
Hgb A1c MFr Bld: 5.3 % (ref 4.8–5.6)
Mean Plasma Glucose: 105 mg/dL

## 2024-01-02 LAB — MAGNESIUM: Magnesium: 2 mg/dL (ref 1.7–2.4)

## 2024-01-02 MED ORDER — IPRATROPIUM-ALBUTEROL 0.5-2.5 (3) MG/3ML IN SOLN
3.0000 mL | Freq: Three times a day (TID) | RESPIRATORY_TRACT | Status: DC
  Administered 2024-01-02 – 2024-01-05 (×7): 3 mL via RESPIRATORY_TRACT
  Filled 2024-01-02 (×8): qty 3

## 2024-01-02 MED ORDER — ORAL CARE MOUTH RINSE: 15.0000 mL | OROMUCOSAL | Status: DC | PRN

## 2024-01-02 MED ORDER — APIXABAN 2.5 MG PO TABS
2.5000 mg | ORAL_TABLET | Freq: Two times a day (BID) | ORAL | Status: DC
  Administered 2024-01-02 – 2024-01-03 (×2): 2.5 mg via ORAL
  Filled 2024-01-02 (×2): qty 1

## 2024-01-02 MED ORDER — PROSOURCE PLUS PO LIQD
30.0000 mL | Freq: Two times a day (BID) | ORAL | Status: DC
  Administered 2024-01-02 – 2024-01-10 (×7): 30 mL via ORAL
  Filled 2024-01-02 (×3): qty 30

## 2024-01-02 MED ORDER — BISACODYL 10 MG RE SUPP: 10.0000 mg | Freq: Once | RECTAL | Status: DC

## 2024-01-02 MED ORDER — LEVOTHYROXINE SODIUM 50 MCG PO TABS
150.0000 ug | ORAL_TABLET | Freq: Every day | ORAL | Status: DC
  Administered 2024-01-03 – 2024-01-11 (×9): 150 ug via ORAL
  Filled 2024-01-02 (×9): qty 1

## 2024-01-02 MED ORDER — NEPRO/CARBSTEADY PO LIQD
237.0000 mL | Freq: Two times a day (BID) | ORAL | Status: DC
  Administered 2024-01-02 – 2024-01-09 (×8): 237 mL via ORAL

## 2024-01-02 NOTE — Progress Notes (Signed)
 NAME:  Daniel Carr, MRN:  [id number], DOB:  21-Jun-1935, LOS: 6 ADMISSION DATE:  12/27/2023, CONSULTATION DATE:  12/27/2023 REFERRING MD:  Dr. Dicky, CHIEF COMPLAINT:  Shortness of Breath   Brief Pt Description / Synopsis:  89 y.o. Male admitted with Acute Metabolic Encephalopathy, Acute Hypoxic Hypercapnic Respiratory Failure in the setting of Severe Sepsis with Septic Shock due to Enterococcus Faecalis BACTEREMIA due to UTI, transaminitis and AKI requiring intubation and mechanical ventilation, along with initiation of CRRT.  History of Present Illness:  This is an 89 yo male who presented to [facility] ER via EMS on 12/31 from [facility] with altered mental status and acute respiratory failure.  Pts family reports he has had worsening bilateral lower extremity edema and decreased urinary output.  His PCP increased his outpatient lasix  dose to 40 mg daily, however his urinary output did not improve.  Pts granddaughter reports he also c/o chills and fevers.  He was recently hospitalized at [facility] on 12/02/23 with a right humerus fracture from a mechanical fall on 12/3 and evaluated by ortho who recommended a sling for comfort and outpatient follow-up.  However, during presentation he was found to have progressive dyspnea; severe pulmonary hypertension; hyponatremia; acute blood loss anemia suspected secondary to right chest wall hematoma and acute kidney injury on chronic kidney disease requiring hospital admission.  Following treatment he was discharged on 12/17 to [facility] for rehab and instructed not to resume his outpatient apixaban  until 12/18.  His daughter reports the pts apixaban  has been resumed as instructed.     ED Course  Upon arrival to the ER pt in severe respiratory distress, hypotensive, and minimally responsive requiring mechanical intubation.  Significant lab results were: Na+ 125/K+ 5.9/chloride 86/BUN 72/creatinine 4.46/calcium 8.7/albumin  2.5/AST 143/ALT 89/troponin 550/lactic  acid 2.4/wbc 15.9/hgb 11.2/PT 25.0/INR 2.2/APTT 48.  CXR concerning for possible small right pleural effusion.  He met sepsis criteria and received: 2L saline bolus; vancomycin , and aztreonam .  Despite iv fluid resuscitation pt remained hypotensive requiring levophed  gtt.  PCCM team contacted for ICU  admission.  Please see Significant Hospital Events section below for full detailed hospital course.    Pertinent  Medical History  Arthritis  Glaucoma HTN  Hypothyroidism  HLD Hyponatremia  HFpEF  Right Humerus Fracture secondary to mechanical fall (11/29/2023) Anemia of Chronic Disease  COPD Aortic Stenosis   MGUS PE in 2022 previously on apixaban  but pt self discontinued restarted on 12/14/23 due to CTEPH   Micro Data:  12/31: Covid/RSV/Flu PCR>>negative 12/31: Blood cultures>> Enterococcus Faecalis 12/31: MRSA PCR>>negative 1/1: Urine>> Enterococcus Faecalis 1/1: Tracheal aspirate>> normal respiratory flora 1/1: Hepatitis panel>>negative  Antimicrobials:   Anti-infectives (From admission, onward)    Start     Dose/Rate Route Frequency Ordered Stop   12/29/23 1200  ampicillin  (OMNIPEN) 2 g in sodium chloride  0.9 % 100 mL IVPB        2 g 300 mL/hr over 20 Minutes Intravenous Every 6 hours 12/29/23 0740     12/28/23 0700  ampicillin  (OMNIPEN) 2 g in sodium chloride  0.9 % 100 mL IVPB  Status:  Discontinued        2 g 300 mL/hr over 20 Minutes Intravenous Every 8 hours 12/28/23 0308 12/29/23 0740   12/27/23 2200  piperacillin -tazobactam (ZOSYN ) IVPB 2.25 g  Status:  Discontinued        2.25 g 100 mL/hr over 30 Minutes Intravenous Every 8 hours 12/27/23 1530 12/28/23 0307   12/27/23 1245  aztreonam  (AZACTAM ) 2 g in  sodium chloride  0.9 % 100 mL IVPB        2 g 200 mL/hr over 30 Minutes Intravenous  Once 12/27/23 1233 12/27/23 1343   12/27/23 1245  vancomycin  (VANCOCIN ) IVPB 1000 mg/200 mL premix        1,000 mg 200 mL/hr over 60 Minutes Intravenous  Once 12/27/23 1233  12/27/23 1446       Significant Hospital Events: Including procedures, antibiotic start and stop dates in addition to other pertinent events   12/31: Pt admitted with acute hypercapnic respiratory failure, septic shock, AECOPD, pneumonia suspect aspiration, acute kidney injury, acute on chronic hyponatremia, transaminitis, and acute metabolic encephalopathy mechanical intubation  12/27/22- patient with improved mentation post CRRT.  Reducing his sedatives on MV today.  Sepsis bacteremia, ID on case.  He is oozing blood from IV sites, will order DIC panel and pathology slide review.   12/29/23- patient following verbal communication this am, remains in anasarca. Bacteremia being treated. He has CRRT with now removal of fluid at 100/hr.  Starting NG feeds.  Tracheal aspirate with GPCs in pairs.  ID on case continues with ampicillin  reducing steroids and off levophed  today. 12/30/23- patient for SBT today.  Last day of steroids today. Failed SBT today.  S/p iD evaluation 12/31/23-patient liberated from MV today on BIPAP, required precedex  for aggitation. Remain on ampicillin . Met with daughter and granddaughter few times to review findings and medical plan.  01/01/24- patient improved overnight, weaned of bipap on hfnc, remains edematous but improved on CRRT with 100/h UF, met with family reviewed medical plan, ordering PT/OT for tommorow. Remains on [place] heparin  with CTEPH/chronic DVTs. CRRT stopped due to clotting filter. 01/02/24- Off BiPAP and Precedex , respiratory and mental status improving.  Vasopressors weaned off.  Creatinine increased, Urine output improved, Nephrology to decide on whether to resume CRRT vs trial intermittent HD vs observation.  Resume outpatient Eliquis  at reduced dose (2.5 mg BID) for chronic bilateral LE DVT's.  Interim History / Subjective:  -No significant events noted overnight -Afebrile, hemodynamically stable, no vasopressors -Weaned off BiPAP and Precedex , mental status slowly  improving -On 8L HFNC -CRRT filter clotted yesterday, and has since been held ~ Creatinine increased to 1.75 from 1.33, electrolytes acceptable, UOP 700 cc last 24 hrs (net -8 L) -Nephrology to decide on whether to resume CRRT vs trialing intermittent HD vs observation   Objective   Blood pressure 116/68, pulse 92, temperature 98.2 F (36.8 C), temperature source Axillary, resp. rate (!) 33, height 6' 0.99 (1.854 m), weight 96.4 kg, SpO2 93%.    FiO2 (%):  [35 %] 35 %   Intake/Output Summary (Last 24 hours) at 01/02/2024 0859 Last data filed at 01/02/2024 0846 Gross per 24 hour  Intake 1007.76 ml  Output 1125 ml  Net -117.24 ml   Filed Weights   12/31/23 0411 01/01/24 0447 01/02/24 0718  Weight: 101.9 kg 96.1 kg 96.4 kg    Examination: General: Acute on chronically ill-appearing elderly frail male, laying in bed, on 8 L high flow nasal cannula, no acute distress HENT: Atraumatic, normocephalic, neck supple Lungs: Diminished breath sounds bilaterally, even, nonlabored Cardiovascular: Regular rate and rhythm, S1-S2, no murmurs, rubs, gallops Abdomen: Soft, nontender, nondistended, no guarding or rebound tenderness, bowel sounds positive x 4 Extremities: Right arm sling present secondary to recent right humerus fracture with 2+ edema  Neuro: Sleeping, arouses easily to voice, oriented to person and place, no gross FND GU: Foley catheter in place draining yellow urine (outpatient foley  exchanged)  Resolved Hospital Problem list     Assessment & Plan:   #Septic Shock ~ RESOLVED #Acute on Chronic HFpEF #Elevated Troponin, suspect demand ischemia  #CTEPH PMHx: HTN, aortic stenosis, PE, HLD Echocardiogram 12/28/23: LVEF 60-65%, indeterminate diastolic parameters, moderate Aortic stenosis -Continuous cardiac monitoring -Maintain MAP >65 -Vasopressors as needed to maintain MAP goal ~ weaned off -HS Troponin peaked at 550 -Volume removal with renal replacement therapy  #Severe  Sepsis  #Enterococcus Faecalis BACTEREMIA #Enterococcus Faecalis UTI #Questionable Pneumonia -Monitor fever curve -Trend WBC's & Procalcitonin -Follow cultures as above -ID is following, appreciate input ~ Continue empiric Ampicillin  pending cultures & sensitivities  #Acute Hypoxic & Hypercapnic Respiratory Failure #AECOPD -EXTUBATED 1/4 -Supplemental O2 as needed to maintain O2 sats 88 to 92% -BiPAP qhs -Follow intermittent Chest X-ray & ABG as needed -Bronchodilators  -ABX as above -Diuresis as BP and renal function permits ~ Volume removal with Renal Replacement Therapy -Pulmonary toilet as able  #Acute kidney injury suspect secondary to ATN and diuretic therapy  #Hyperkalemia ~ RESOLVED #Acute on chronic hyponatremia ~ RESOLVED #Lactic acidosis  -Monitor I&O's / urinary output -Follow BMP -Ensure adequate renal perfusion -Avoid nephrotoxic agents as able -Replace electrolytes as indicated ~ Pharmacy following for assistance with electrolyte replacement -Nephrology following, appreciate input ~ Renal Replacement Therapy as per Nephrology  #Previous Right Humerus Fracture #Chronic Bilateral Lower Extremity DVT's -Venous US  of Bilateral LE on 12/31 positive for extensive bilateral non-occlusive wall thickening/chronic DVT extending from level of the bilateral common femoral through the bilateral popliteal veins.  No acute or occlusive DVT within either lower extremity -Venous US  of RUE on 1/1 negative for DVT -Fracture was being managed conservatively outpatient by Ortho ~ continue sling -Will resume outpatient Eliquis , but will start at 2.5 mg BID given renal function  #Anemia of chronic disease -Monitor for S/Sx of bleeding -Trend CBC -Resume home Eliquis  for AC/VTE Prophylaxis  -Transfuse for Hgb <7  #Hyperglycemia #Hypothyroidism -CBG's ac & qhs; Target range of 140 to 180 -SSI  -Follow ICU Hypo/Hyperglycemia protocol -Continue Synthroid   #Acute Metabolic  Encephalopathy ~ IMPROVING CT Head on 12/31 negative for acute intracranial process -Treatment of metabolic derangements as outlined above -Provide supportive care -Promote normal sleep/wake cycle and family presence -Avoid sedating medications as able       Best Practice (right click and Reselect all SmartList Selections daily)   Diet/type: Regular consistency (see orders) DVT prophylaxis: DOAC GI prophylaxis: N/A Lines: Dialysis Catheter and yes and it is still needed Foley:  Yes, and it is still needed Code Status:  full code Last date of multidisciplinary goals of care discussion [1/6]  1/6: Pt and his daughter updated at bedside on plan of care.  Labs   CBC: Recent Labs  Lab 12/27/23 1202 12/28/23 0427 12/29/23 0501 12/30/23 0408 12/31/23 0839 01/01/24 0428 01/02/24 0400  WBC 15.9*   < > 12.1* 15.1* 16.5* 13.2* 12.8*  NEUTROABS 12.3*  --   --   --  13.8* 10.8* 10.0*  HGB 11.2*   < > 8.1* 8.0* 8.8* 9.1* 8.7*  HCT 36.3*   < > 23.0* 23.2* 26.2* 28.3* 27.4*  MCV 101.4*   < > 89.5 89.6 92.9 97.9 100.0  PLT 151   < > 150 179 212 207 205   < > = values in this interval not displayed.    Basic Metabolic Panel: Recent Labs  Lab 12/29/23 0501 12/29/23 1702 12/30/23 0408 12/30/23 1607 12/31/23 9587 12/31/23 1605 01/01/24 0428 01/01/24 1603  01/02/24 0400  NA 130*   < > 135   < > 135 134* 136 135 137  K 3.9   < > 3.8   < > 3.6 3.7 3.5 4.1 4.1  CL 98   < > 102   < > 102 101 102 101 104  CO2 21*   < > 26   < > 24 23 26 25 26   GLUCOSE 124*   < > 134*   < > 167* 115* 93 98 111*  BUN 46*   < > 40*   < > 49* 48* 40* 41* 49*  CREATININE 2.03*   < > 1.62*   < > 1.47* 1.39* 1.15 1.33* 1.75*  CALCIUM 8.1*   < > 9.0   < > 9.4 9.1 9.4 9.2 8.7*  MG 2.1  --  2.1  --  2.0  --  2.0  --  2.0  PHOS 3.0   < > 2.9   < > 2.6 2.4* 2.4* 5.2* 4.6   < > = values in this interval not displayed.   GFR: Estimated Creatinine Clearance: 37.1 mL/min (A) (by C-G formula based on SCr of  1.75 mg/dL (H)). Recent Labs  Lab 12/27/23 1202 12/27/23 1650 12/27/23 1651 12/27/23 1956 12/28/23 0021 12/28/23 0427 12/29/23 0501 12/30/23 0408 12/31/23 0839 01/01/24 0428 01/02/24 0400  PROCALCITON  --  19.58  --   --   --  12.67  --   --   --   --   --   WBC 15.9*  --   --   --   --  13.0*   < > 15.1* 16.5* 13.2* 12.8*  LATICACIDVEN 2.4*  --  4.0* 3.6* 2.1*  --   --   --   --   --   --    < > = values in this interval not displayed.    Liver Function Tests: Recent Labs  Lab 12/27/23 1202 12/28/23 0427 12/28/23 1556 12/31/23 0412 12/31/23 1605 01/01/24 0428 01/01/24 1603 01/02/24 0400  AST 143* 72*  --   --   --   --   --   --   ALT 89* 59*  --   --   --   --   --   --   ALKPHOS 80 63  --   --   --   --   --   --   BILITOT 0.9 0.9  --   --   --   --   --   --   PROT 6.8 5.4*  --   --   --   --   --   --   ALBUMIN  2.5* 2.1*  2.0*   < > 2.5* 2.6* 2.5* 2.4* 2.3*   < > = values in this interval not displayed.   Recent Labs  Lab 12/27/23 1202  LIPASE 25   Recent Labs  Lab 12/27/23 1650 12/28/23 0427  AMMONIA 99* 31    ABG    Component Value Date/Time   PHART 7.42 12/31/2023 1556   PCO2ART 42 12/31/2023 1556   PO2ART 105 12/31/2023 1556   HCO3 27.2 12/31/2023 1556   ACIDBASEDEF 3.3 (H) 12/27/2023 1659   O2SAT 98.8 12/31/2023 1556     Coagulation Profile: Recent Labs  Lab 12/27/23 1202  INR 2.2*    Cardiac Enzymes: No results for input(s): CKTOTAL, CKMB, CKMBINDEX, TROPONINI in the last 168 hours.  HbA1C: No results found for: HGBA1C  CBG:  Recent Labs  Lab 01/01/24 1010 01/01/24 1312 01/01/24 1600 01/01/24 2138 01/02/24 0821  GLUCAP 120* 88 90 126* 88    Review of Systems:   Unable to assess due to AMS   Past Medical History:  He,  has a past medical history of Arthritis, childhood infection, Glaucoma, Hypertension, Hypothyroidism, Sinus problem, and Ulnar neuropathy.   Surgical History:   Past Surgical History:   Procedure Laterality Date   APPENDECTOMY     CATARACT EXTRACTION Bilateral    COLONOSCOPY     JOINT REPLACEMENT Right    KNEE ARTHROPLASTY Left 12/24/2020   Procedure: COMPUTER ASSISTED TOTAL KNEE ARTHROPLASTY;  Surgeon: Mardee Lynwood SQUIBB, MD;  Location: [facility] ORS;  Service: Orthopedics;  Laterality: Left;  3RD CASE   KNEE SURGERY Right 10/04/2011   replacement   NM MYOVIEW  ([facility] HX)  07/21/2017   [facility]. No ischemia.    RETINAL DETACHMENT REPAIR W/ SCLERAL BUCKLE LE     TEE WITHOUT CARDIOVERSION N/A 04/29/2023   Procedure: TRANSESOPHAGEAL ECHOCARDIOGRAM (TEE);  Surgeon: Hilarie Rocher, MD;  Location: [facility] ORS;  Service: Cardiovascular;  Laterality: N/A;   TONSILLECTOMY       Social History:   reports that he quit smoking about 54 years ago. His smoking use included cigarettes. He started smoking about 60 years ago. He has a 1.5 pack-year smoking history. He has never used smokeless tobacco. He reports that he does not drink alcohol  and does not use drugs.   Family History:  His family history includes Hypertension in his father and mother; Stroke in his father and sister.   Allergies No Known Allergies   Home Medications  Prior to Admission medications   Medication Sig Start Date End Date Taking? Authorizing Provider  acetaminophen  (TYLENOL ) 500 MG tablet Take 1,000 mg by mouth every 8 (eight) hours. 12/13/23  Yes [provider]  apixaban  (ELIQUIS ) 5 MG TABS tablet Take 5 mg by mouth 2 (two) times daily. 12/14/23  Yes [provider]  azelastine  (ASTELIN ) 0.1 % nasal spray Place 2 sprays into both nostrils 2 (two) times daily. 11/09/19  Yes [provider]  brimonidine  (ALPHAGAN ) 0.2 % ophthalmic solution Place 1 drop into both eyes 3 (three) times daily. 03/26/15  Yes [provider]  Cholecalciferol (VITAMIN D3) 10 MCG (400 UNIT) tablet Take 800 Units by mouth daily. 12/14/23  Yes [provider]  diclofenac  Sodium (VOLTAREN ) 1 % GEL Apply 2  g topically 4 (four) times daily. 12/13/23 12/12/24 Yes [provider]  dorzolamide  (TRUSOPT ) 2 % ophthalmic solution Place 1 drop into the left eye 3 (three) times daily. 11/08/21  Yes [provider]  doxycycline (ADOXA) 100 MG tablet Take 100 mg by mouth 2 (two) times daily. X 7 days   Yes [provider]  dutasteride  (AVODART ) 0.5 MG capsule Take 1 capsule by mouth once daily 12/23/22  Yes [name], Nancyann BRAVO, MD  ferrous sulfate  325 (65 FE) MG tablet Take 325 mg by mouth daily.   Yes [provider]  furosemide  (LASIX ) 20 MG tablet Take 20 mg by mouth daily.   Yes [provider]  hydroxychloroquine (PLAQUENIL) 200 MG tablet Take 200 mg by mouth daily. 09/07/23  Yes [provider]  latanoprost  (XALATAN ) 0.005 % ophthalmic solution  03/25/22  Yes [provider]  levocetirizine (XYZAL ) 5 MG tablet Take 5 mg by mouth every evening.   Yes [provider]  levothyroxine  (SYNTHROID ) 150 MCG tablet Take 1 tablet by mouth once daily  12/23/22  Yes Gasper Nancyann BRAVO, MD  lidocaine  4 % Place 1 patch onto the skin daily. 12/13/23  Yes [provider]  lovastatin  (MEVACOR ) 40 MG tablet Take 1 tablet by mouth once daily 05/09/23  Yes [name], Nancyann BRAVO, MD  melatonin 3 MG TABS tablet Take 3 mg by mouth at bedtime. 12/13/23  Yes [provider]  mirtazapine  (REMERON ) 15 MG tablet Take 15 mg by mouth at bedtime. 12/13/23  Yes [provider]  Multiple Vitamin (DAILY VITAMIN PO) Take 1 tablet by mouth daily.   Yes [provider]  oxyCODONE  (OXY IR/ROXICODONE ) 5 MG immediate release tablet Take 2.5 mg by mouth every 6 (six) hours as needed for severe pain (pain score 7-10) or moderate pain (pain score 4-6).   Yes [provider]  polyethylene glycol (MIRALAX  / GLYCOLAX ) 17 g packet Take 17 g by mouth 2 (two) times daily. 12/13/23 01/12/24 Yes [provider]  senna (SENOKOT) 8.6 MG tablet Take  2 tablets by mouth 2 (two) times daily. 12/13/23 01/12/24 Yes [provider]  simethicone (MYLICON) 80 MG chewable tablet Chew 80 mg by mouth every 6 (six) hours as needed for flatulence. 12/13/23 01/12/24 Yes [provider]  sodium chloride  1 g tablet Take 1 g by mouth daily.   Yes [provider]  umeclidinium-vilanterol (ANORO ELLIPTA) 62.5-25 MCG/ACT AEPB Inhale 1 puff into the lungs daily. 12/13/23  Yes [provider]  albuterol  (VENTOLIN  HFA) 108 (90 Base) MCG/ACT inhaler SMARTSIG:2 Puff(s) By Mouth As Directed Patient not taking: Reported on 12/27/2023    [provider]  aspirin  81 MG EC tablet Take 1 tablet (81 mg total) by mouth daily. Swallow whole. Patient not taking: Reported on 12/27/2023 07/06/21   Gasper Nancyann BRAVO, MD  clotrimazole -betamethasone  (LOTRISONE ) cream APPLY TOPICALLY ONCE DAILY Patient not taking: Reported on 03/21/2023 04/05/22   Gasper Nancyann BRAVO, MD  DULoxetine  (CYMBALTA ) 30 MG capsule Take 30 mg by mouth daily. 09/13/23 12/11/23  [provider]  ipratropium (ATROVENT ) 0.06 % nasal spray Place 1 spray into both nostrils 2 (two) times daily as needed for rhinitis. Patient not taking: Reported on 12/27/2023 03/15/18   [provider]  losartan (COZAAR) 25 MG tablet Take 25 mg by mouth daily. Patient not taking: Reported on 12/27/2023    [provider]  pregabalin  (LYRICA ) 50 MG capsule Take 1 capsule (50 mg total) by mouth 2 (two) times daily. Patient not taking: Reported on 12/27/2023 09/28/23   Gasper Nancyann BRAVO, MD  vitamin B-12 (CYANOCOBALAMIN) 100 MCG tablet Take 100 mcg by mouth daily. Unsure of dose  is OTC med Patient not taking: Reported on 09/22/2023    [provider]     Critical care time: 40 minutes     Inge Lecher, AGACNP-BC [facility] Prefer epic messenger for cross cover needs If after hours, please call E-link

## 2024-01-02 NOTE — Progress Notes (Signed)
 [facility]  PROGRESS NOTE   Subjective:   Multiple family members present in the room with him.  Patient currently requiring high flow oxygen .  CRRT on hold.  Patient is off pressors. Increased urine output noted in Foley catheter.  Continues to have lower extremity edema.  Objective:  Vital signs: Blood pressure 116/68, pulse 90, temperature 97.9 F (36.6 C), temperature source Axillary, resp. rate (!) 27, height 6' 0.99 (1.854 m), weight 96.4 kg, SpO2 95%.  Intake/Output Summary (Last 24 hours) at 01/02/2024 1611 Last data filed at 01/02/2024 1227 Gross per 24 hour  Intake 1307.76 ml  Output 950 ml  Net 357.76 ml   Filed Weights   12/31/23 0411 01/01/24 0447 01/02/24 0718  Weight: 101.9 kg 96.1 kg 96.4 kg     Physical Exam: General:  No acute distress  Head:  Normocephalic, atraumatic. Moist oral mucosal membranes  Eyes:  Anicteric  Neck:  Supple  Lungs:   Clear to auscultation, normal effort  Heart:  S1S2 no rubs  Abdomen:   Soft, nontender, bowel sounds present  Extremities:  peripheral edema present.  Neurologic:  Awake, alert, following commands  Skin:  No lesions  Access:     Basic Metabolic Panel: Recent Labs  Lab 12/29/23 0501 12/29/23 1702 12/30/23 0408 12/30/23 1607 12/31/23 0412 12/31/23 1605 01/01/24 0428 01/01/24 1603 01/02/24 0400 01/02/24 1459  NA 130*   < > 135   < > 135 134* 136 135 137 135  K 3.9   < > 3.8   < > 3.6 3.7 3.5 4.1 4.1 4.6  CL 98   < > 102   < > 102 101 102 101 104 102  CO2 21*   < > 26   < > 24 23 26 25 26  20*  GLUCOSE 124*   < > 134*   < > 167* 115* 93 98 111* 117*  BUN 46*   < > 40*   < > 49* 48* 40* 41* 49* 50*  CREATININE 2.03*   < > 1.62*   < > 1.47* 1.39* 1.15 1.33* 1.75* 1.83*  CALCIUM 8.1*   < > 9.0   < > 9.4 9.1 9.4 9.2 8.7* 8.9  MG 2.1  --  2.1  --  2.0  --  2.0  --  2.0  --   PHOS 3.0   < > 2.9   < > 2.6 2.4* 2.4* 5.2* 4.6 4.5   < > = values in this interval not displayed.   GFR: Estimated  Creatinine Clearance: 35.5 mL/min (A) (by C-G formula based on SCr of 1.83 mg/dL (H)).  Liver Function Tests: Recent Labs  Lab 12/27/23 1202 12/28/23 0427 12/28/23 1556 12/31/23 1605 01/01/24 0428 01/01/24 1603 01/02/24 0400 01/02/24 1459  AST 143* 72*  --   --   --   --   --   --   ALT 89* 59*  --   --   --   --   --   --   ALKPHOS 80 63  --   --   --   --   --   --   BILITOT 0.9 0.9  --   --   --   --   --   --   PROT 6.8 5.4*  --   --   --   --   --   --   ALBUMIN  2.5* 2.1*  2.0*   < > 2.6* 2.5* 2.4* 2.3* 2.5*   < > =  values in this interval not displayed.   Recent Labs  Lab 12/27/23 1202  LIPASE 25   Recent Labs  Lab 12/27/23 1650 12/28/23 0427  AMMONIA 99* 31    CBC: Recent Labs  Lab 12/27/23 1202 12/28/23 0427 12/29/23 0501 12/30/23 0408 12/31/23 0839 01/01/24 0428 01/02/24 0400  WBC 15.9*   < > 12.1* 15.1* 16.5* 13.2* 12.8*  NEUTROABS 12.3*  --   --   --  13.8* 10.8* 10.0*  HGB 11.2*   < > 8.1* 8.0* 8.8* 9.1* 8.7*  HCT 36.3*   < > 23.0* 23.2* 26.2* 28.3* 27.4*  MCV 101.4*   < > 89.5 89.6 92.9 97.9 100.0  PLT 151   < > 150 179 212 207 205   < > = values in this interval not displayed.     HbA1C: Hgb A1c MFr Bld  Date/Time Value Ref Range Status  12/31/2023 08:39 AM 5.3 4.8 - 5.6 % Final    Comment:    (NOTE)         Prediabetes: 5.7 - 6.4         Diabetes: >6.4         Glycemic control for adults with diabetes: <7.0     Urinalysis: No results for input(s): COLORURINE, LABSPEC, PHURINE, GLUCOSEU, HGBUR, BILIRUBINUR, KETONESUR, PROTEINUR, UROBILINOGEN, NITRITE, LEUKOCYTESUR in the last 72 hours.  Invalid input(s): APPERANCEUR    Imaging: No results found.    Medications:    ampicillin  (OMNIPEN) IV Stopped (01/02/24 1225)   dexmedetomidine  (PRECEDEX ) IV infusion Stopped (01/01/24 0906)   norepinephrine  (LEVOPHED ) Adult infusion Stopped (01/02/24 0400)   PrismaSol  BGK 2/3.5 400 mL/hr at 01/01/24 0647    PrismaSol  BGK 2/3.5 400 mL/hr at 01/01/24 9350   PrismaSol  BGK 2/3.5 1,000 mL/hr at 01/01/24 9356    (feeding supplement) PROSource Plus  30 mL Oral BID BM   apixaban   2.5 mg Oral BID   brimonidine   1 drop Both Eyes TID   Chlorhexidine  Gluconate Cloth  6 each Topical Daily   docusate sodium   100 mg Oral BID   dorzolamide   1 drop Left Eye TID   feeding supplement (NEPRO CARB STEADY)  237 mL Oral BID BM   insulin  aspart  0-5 Units Subcutaneous QHS   insulin  aspart  0-6 Units Subcutaneous TID WC   ipratropium-albuterol   3 mL Nebulization Q6H   latanoprost   1 drop Both Eyes QHS   [START ON 01/03/2024] levothyroxine   150 mcg Oral Q0600   midodrine   5 mg Oral TID WC   multivitamin  1 tablet Oral QHS   mouth rinse  15 mL Mouth Rinse 4 times per day    Assessment/ Plan:     89 y.o. male with past medical history of hypertension, hypothyroidism, neuropathy, COPD, MGUS, history of PE on apixaban , diastolic heart failure, hyperlipidemia who was admitted on 12/27/2023 for acute hypoxic respiratory failure.   1.  Acute kidney injury/acute metabolic acidosis/hyperkalemia/hyponatremia.  Baseline creatinine 0.9 on 12/13/2023.  CRRT on hold.  Patient is off pressors. We will reevaluate daily for need of further hemodialysis. Improved urine output is also noted.   2.  Acute respiratory failure.  Patient is now on oxygen  supplementation by nasal cannula.   3.  Hypotension secondary to systemic shock from sepsis and volume depletion.  Norepinephrine  being weaned down.  Spoke to family at bedside. Labs and medications reviewed. Will continue to follow along with you.   LOS: 6 Saralee Stank, MD [facility] 1/6/20254:11  PM

## 2024-01-02 NOTE — Consult Note (Signed)
 PHARMACY CONSULT NOTE - ELECTROLYTES  Pharmacy Consult for Electrolyte Monitoring and Replacement   Recent Labs: Potassium (mmol/L)  Date Value  01/02/2024 4.1   Magnesium  (mg/dL)  Date Value  98/93/7974 2.0   Calcium (mg/dL)  Date Value  98/93/7974 8.7 (L)   Albumin  (g/dL)  Date Value  98/93/7974 2.3 (L)  09/28/2023 4.0   Phosphorus (mg/dL)  Date Value  98/93/7974 4.6   Sodium (mmol/L)  Date Value  01/02/2024 137  09/28/2023 132 (L)   Height: 6' 0.99 (185.4 cm) Weight: 96.1 kg (211 lb 13.8 oz) IBW/kg (Calculated) : 79.88 Estimated Creatinine Clearance: 37 mL/min (A) (by C-G formula based on SCr of 1.75 mg/dL (H)).  Assessment  Daniel Carr is a 89 y.o. male presenting with acute on chronic respiratory failure. PMH significant for HTN, hypothyroidism, HFpEF, COPD, aortic stenosis, Hx PE / CTEPH. Pharmacy has been consulted to monitor and replace electrolytes. Patient initiated on CRRT on 12/28/2023   Goal of Therapy: Electrolytes within normal limits  Plan:  --no electrolyte replacement warranted for today --Patient is now on CRRT. Twice daily renal function panel labs ordered  Thank you for allowing pharmacy to be a part of this patient's care.  Adriana JONETTA Bolster, PharmD Clinical Pharmacist 01/02/2024 7:18 AM

## 2024-01-02 NOTE — Plan of Care (Signed)
  Problem: Education: Goal: Knowledge of General Education information will improve Description: Including pain rating scale, medication(s)/side effects and non-pharmacologic comfort measures Outcome: Progressing   Problem: Health Behavior/Discharge Planning: Goal: Ability to manage health-related needs will improve Outcome: Progressing   Problem: Clinical Measurements: Goal: Ability to maintain clinical measurements within normal limits will improve Outcome: Progressing Goal: Will remain free from infection Outcome: Progressing Goal: Diagnostic test results will improve Outcome: Progressing Goal: Respiratory complications will improve Outcome: Progressing Goal: Cardiovascular complication will be avoided Outcome: Progressing   Problem: Activity: Goal: Risk for activity intolerance will decrease Outcome: Not Progressing   Problem: Nutrition: Goal: Adequate nutrition will be maintained Outcome: Progressing   Problem: Coping: Goal: Level of anxiety will decrease Outcome: Progressing   Problem: Elimination: Goal: Will not experience complications related to bowel motility Outcome: Not Progressing Goal: Will not experience complications related to urinary retention Outcome: Progressing   Problem: Pain Management: Goal: General experience of comfort will improve Outcome: Progressing   Problem: Safety: Goal: Ability to remain free from injury will improve Outcome: Progressing   Problem: Skin Integrity: Goal: Risk for impaired skin integrity will decrease Outcome: Progressing

## 2024-01-02 NOTE — Progress Notes (Signed)
 Date of Admission:  12/27/2023      ID: Daniel Carr is a 89 y.o. male  Principal Problem:   Acute hypoxic respiratory failure (HCC)  89 y.o. with a history of COPD, left hemidiaphragm paralysis, aortic stenosis , HTN, Hypothyroidism, h/o PE , b/l TKA Recent hospitalization at [facility] 12/6-12/17 with progressive dyspnea and found to have Severe Pulmonary HTN, chronic co2 retention, CTEPH on eliquis , fracture rt humerus from mechanical fall on 12/3 ( managed non surgical), chronic hyponatremia, urinary retention and foley was placed draining of urine  , started remeron  for appetite and mood and discharged to [facility] on 12/13/23   Presented to [facility] ED on 12/27/23 from [facility] with altered mental status and decreased urine output, inspite of lasix  for severe leg edema. HE was also reported to have chills and fever as per granddaughter  Subjective: Extubated over weekend Off CRRT Daughter at bed side HE is sleeping  Medications:   (feeding supplement) PROSource Plus  30 mL Oral BID BM   brimonidine   1 drop Both Eyes TID   Chlorhexidine  Gluconate Cloth  6 each Topical Daily   docusate sodium   100 mg Oral BID   dorzolamide   1 drop Left Eye TID   feeding supplement (NEPRO CARB STEADY)  237 mL Oral BID BM   heparin  injection (subcutaneous)  5,000 Units Subcutaneous Q8H   insulin  aspart  0-5 Units Subcutaneous QHS   insulin  aspart  0-6 Units Subcutaneous TID WC   ipratropium-albuterol   3 mL Nebulization Q6H   latanoprost   1 drop Both Eyes QHS   [START ON 01/03/2024] levothyroxine   150 mcg Oral Q0600   midodrine   5 mg Oral TID WC   multivitamin  1 tablet Oral QHS   mouth rinse  15 mL Mouth Rinse 4 times per day    Objective: Vital signs in last 24 hours: Patient Vitals for the past 24 hrs:  BP Temp Temp src Pulse Resp SpO2 Weight  01/02/24 1200 -- 98 F (36.7 C) Oral 90 (!) 28 97 % --  01/02/24 1100 -- -- -- -- (!) 29 -- --  01/02/24 1000 -- -- -- -- (!) 29 95 % --   01/02/24 0900 -- -- -- -- (!) 38 94 % --  01/02/24 0800 -- 98.2 F (36.8 C) Axillary 92 (!) 33 93 % --  01/02/24 0744 -- -- -- -- -- 94 % --  01/02/24 0718 -- -- -- -- -- -- 96.4 kg  01/02/24 0715 -- -- -- -- (!) 24 -- --  01/02/24 0700 -- -- -- -- (!) 38 -- --  01/02/24 0645 -- -- -- -- (!) 25 -- --  01/02/24 0630 -- -- -- -- (!) 27 -- --  01/02/24 0615 -- -- -- -- (!) 24 -- --  01/02/24 0600 -- -- -- -- (!) 27 -- --  01/02/24 0545 -- -- -- -- (!) 22 -- --  01/02/24 0530 -- -- -- -- (!) 23 -- --  01/02/24 0515 -- -- -- -- (!) 22 -- --  01/02/24 0500 -- -- -- -- (!) 24 -- --  01/02/24 0445 -- -- -- -- (!) 25 -- --  01/02/24 0430 -- -- -- -- (!) 24 -- --  01/02/24 0415 -- -- -- -- (!) 28 -- --  01/02/24 0403 -- 97.9 F (36.6 C) Oral 84 (!) 28 96 % --  01/02/24 0400 -- -- -- -- (!) 22 -- --  01/02/24 0345 -- -- -- -- MAGNUS  29 -- --  01/02/24 0330 -- -- -- -- (!) 25 -- --  01/02/24 0315 -- -- -- -- (!) 22 -- --  01/02/24 0300 -- -- -- -- (!) 24 -- --  01/02/24 0245 -- -- -- -- (!) 30 -- --  01/02/24 0230 -- -- -- -- (!) 24 -- --  01/02/24 0215 -- -- -- -- (!) 25 -- --  01/02/24 0200 -- -- -- -- (!) 29 -- --  01/02/24 0153 -- -- -- -- -- 92 % --  01/02/24 0145 -- -- -- -- (!) 28 -- --  01/02/24 0130 -- -- -- 81 (!) 27 95 % --  01/02/24 0115 -- -- -- 77 (!) 26 96 % --  01/02/24 0100 -- -- -- 88 (!) 29 96 % --  01/02/24 0045 -- -- -- 83 (!) 31 96 % --  01/02/24 0039 -- -- -- 80 (!) 25 95 % --  01/02/24 0030 -- -- -- 87 (!) 28 96 % --  01/02/24 0015 -- -- -- 83 (!) 30 96 % --  01/02/24 0000 -- 99 F (37.2 C) Oral 83 (!) 37 97 % --  01/01/24 2353 -- -- -- 86 (!) 28 95 % --  01/01/24 2345 -- -- -- 93 (!) 28 96 % --  01/01/24 2330 -- -- -- 90 (!) 23 96 % --  01/01/24 2315 -- -- -- 84 (!) 25 97 % --  01/01/24 2300 -- -- -- 96 (!) 24 97 % --  01/01/24 2245 -- -- -- 88 (!) 28 97 % --  01/01/24 2230 -- -- -- 91 (!) 27 97 % --  01/01/24 2215 -- -- -- 100 (!) 28 97 % --  01/01/24 2200  -- -- -- 94 (!) 28 97 % --  01/01/24 2145 -- -- -- 80 (!) 27 97 % --  01/01/24 2130 -- -- -- 90 (!) 36 96 % --  01/01/24 2115 -- -- -- 93 (!) 27 96 % --  01/01/24 2100 -- -- -- 96 (!) 30 96 % --  01/01/24 2045 -- -- -- -- (!) 32 -- --  01/01/24 2030 -- -- -- -- (!) 26 -- --  01/01/24 2015 -- -- -- 96 (!) 49 96 % --  01/01/24 2005 -- -- -- 97 (!) 45 96 % --  01/01/24 2000 -- -- -- 96 (!) 44 96 % --  01/01/24 1945 -- -- -- 96 (!) 41 95 % --  01/01/24 1930 -- 99.7 F (37.6 C) Axillary 96 (!) 50 96 % --  01/01/24 1915 -- -- -- 98 (!) 33 96 % --  01/01/24 1900 -- -- -- -- (!) 49 -- --  01/01/24 1800 -- -- -- 93 (!) 27 99 % --  01/01/24 1719 -- -- -- 96 (!) 30 96 % --  01/01/24 1700 -- -- -- 100 (!) 33 98 % --  01/01/24 1600 116/68 98.4 F (36.9 C) Oral 94 (!) 30 99 % --  01/01/24 1500 110/64 -- -- 97 (!) 32 98 % --  01/01/24 1400 113/74 -- -- 73 (!) 25 97 % --      PHYSICAL EXAM:  General: sleeping Lungs: b/l air entry Heart: Regular rate and rhythm, no murmur, rub or gallop. Left internal jugular cath Abdomen: Soft, non-tender,not distended. Bowel sounds normal. No masses Extremities: edema legs +++ Lymph: Cervical, supraclavicular normal. Neurologic: cannot assess  Lab Results    Latest Ref Rng & Units  01/02/2024    4:00 AM 01/01/2024    4:28 AM 12/31/2023    8:39 AM  CBC  WBC 4.0 - 10.5 K/uL 12.8  13.2  16.5   Hemoglobin 13.0 - 17.0 g/dL 8.7  9.1  8.8   Hematocrit 39.0 - 52.0 % 27.4  28.3  26.2   Platelets 150 - 400 K/uL 205  207  212        Latest Ref Rng & Units 01/02/2024    4:00 AM 01/01/2024    4:03 PM 01/01/2024    4:28 AM  CMP  Glucose 70 - 99 mg/dL 888  98  93   BUN 8 - 23 mg/dL 49  41  40   Creatinine 0.61 - 1.24 mg/dL 8.24  8.66  8.84   Sodium 135 - 145 mmol/L 137  135  136   Potassium 3.5 - 5.1 mmol/L 4.1  4.1  3.5   Chloride 98 - 111 mmol/L 104  101  102   CO2 22 - 32 mmol/L 26  25  26    Calcium 8.9 - 10.3 mg/dL 8.7  9.2  9.4       Microbiology: [facility]  12/28/23- Enterococcus fecalis 01/02/24 repeat culture sent Studies/Results: No results found.    Assessment/Plan: Acute metabolic encephalopathy   Enterococcus fecalis bacteremia with septic shock- source is complicated UTI with indwelling foley Urine culture positive for enterococcus On ampicillin - will need for 2 weeks total- 01/10/24. Continue IV as long as in hospital- can swithc to PO amoxicillin on dicharge Repeat culture sent 2 d echo Will not need TEE as risk for endocarditis is very low   Acute Hypercapnic c resp failure- was mechanically ventilated- 48 hrs since he has been extubated on 1/4 COPD exacerbation   CTEPH   AKI secondary to ATN/   Septic shock with lactic acidosis   Urinary retention- has foley   Recent rt humeral fracture- managed conservatively   Hyponatremia ? Anemia?  Discussed the management with his daughter

## 2024-01-02 NOTE — Progress Notes (Signed)
 Nutrition Follow-up  DOCUMENTATION CODES:   Not applicable  INTERVENTION:   -Nepro Shake po BID, each supplement provides 425 kcal and 19 grams protein  -30 ml Prosource Plus BID, each supplement provides 100 kcals and 15 grams protein -Continue renal MVI daily  NUTRITION DIAGNOSIS:   Inadequate oral intake related to inability to eat (pt sedated and ventilated) as evidenced by NPO status.  Ongoing  GOAL:   Patient will meet greater than or equal to 90% of their needs  Progressing   MONITOR:   PO intake, Supplement acceptance, Diet advancement  REASON FOR ASSESSMENT:   Consult, Ventilator Assessment of nutrition requirement/status  ASSESSMENT:   89 y.o. male with h/o HLD, HTN, hypothyroidism, COPD, glaucoma, BPH, MGUS, PE (2022), CHF, aortic stenosis and recent humerus fracture s/p mechanical fall (12/3) who is admitted with PNA, sepsis, AKI, anemia and shock.  1/1- CRRT initiated 1/4- extubated 1/5- advanced to clear liquid diet, advanced to dysphagia 3 diet, CRRT held secondary to filter clog  Reviewed I/O's: -392 ml x 24 hours and -8.2 L since admission  UOP: 700 ml x2 4 hours  CRRT: 450 ml x 24 hours  Pt sleeping soundly at time of visit. RD spoke with pt's family members at bedside. They reports unsure how he has done with solid foods, but tolerated liquids well yesterday. They anticipate he will eat well as he will be happy to try solid foods. Noted meal completions 10%. Per family, pt has tried the Nepro supplements and liked it. He will occasionally drink Ensure at home.   RD discussed rationale for D3 diet and type of item he will be able to access on it. Family appreciative. Discussed importance of good meal and supplement intake to promote healing; they are amenable to supplements.   Pt has experienced a 10.6% wt loss over the past week; suspect this is related to fluid losses from CRRT. Noted pt -8.2 L since admission. RD will continue to monitor wt  trends.   Noted no BM since 12/27/23; colace added on 12/31/22.  Medications reviewed and include colace and protamine.  Labs reviewed: CBGS: 67-120 (inpatient orders for glycemic control are 0-5 units insulin  aspart daily at bedtime and 0-6 units insulin  aspart TID with meals).    Diet Order:   Diet Order             DIET DYS 3 Room service appropriate? Yes; Fluid consistency: Thin; Fluid restriction: 1500 mL Fluid  Diet effective now                   EDUCATION NEEDS:   Education needs have been addressed  Skin:  Skin Assessment: Reviewed RN Assessment (wound R buttocks)  Last BM:  12/27/23  Height:   Ht Readings from Last 1 Encounters:  12/29/23 6' 0.99 (1.854 m)    Weight:   Wt Readings from Last 1 Encounters:  01/02/24 96.4 kg    Ideal Body Weight:  83.6 kg  BMI:  Body mass index is 28.05 kg/m.  Estimated Nutritional Needs:   Kcal:  1800-2000  Protein:  90-105 grams  Fluid:  1.5 L    Margery ORN, RD, LDN, CDCES Registered Dietitian III Certified Diabetes Care and Education Specialist If unable to reach this RD, please use RD Inpatient group chat on secure chat between hours of 8am-4 pm daily

## 2024-01-03 DIAGNOSIS — J9601 Acute respiratory failure with hypoxia: Secondary | ICD-10-CM | POA: Diagnosis not present

## 2024-01-03 DIAGNOSIS — Z7189 Other specified counseling: Secondary | ICD-10-CM | POA: Diagnosis not present

## 2024-01-03 LAB — GLUCOSE, CAPILLARY
Glucose-Capillary: 78 mg/dL (ref 70–99)
Glucose-Capillary: 134 mg/dL — ABNORMAL HIGH (ref 70–99)
Glucose-Capillary: 102 mg/dL — ABNORMAL HIGH (ref 70–99)
Glucose-Capillary: 113 mg/dL — ABNORMAL HIGH (ref 70–99)
Glucose-Capillary: 101 mg/dL — ABNORMAL HIGH (ref 70–99)

## 2024-01-03 LAB — RENAL FUNCTION PANEL
Albumin: 2.2 g/dL — ABNORMAL LOW (ref 3.5–5.0)
Anion gap: 8 (ref 5–15)
BUN: 47 mg/dL — ABNORMAL HIGH (ref 8–23)
CO2: 25 mmol/L (ref 22–32)
Calcium: 8.3 mg/dL — ABNORMAL LOW (ref 8.9–10.3)
Chloride: 106 mmol/L (ref 98–111)
Creatinine, Ser: 1.73 mg/dL — ABNORMAL HIGH (ref 0.61–1.24)
GFR, Estimated: 38 mL/min — ABNORMAL LOW (ref >60)
Glucose, Bld: 95 mg/dL (ref 70–99)
Phosphorus: 4.3 mg/dL (ref 2.5–4.6)
Potassium: 4.3 mmol/L (ref 3.5–5.1)
Sodium: 139 mmol/L (ref 135–145)

## 2024-01-03 LAB — CBC
HCT: 26 % — ABNORMAL LOW (ref 39.0–52.0)
Hemoglobin: 8.4 g/dL — ABNORMAL LOW (ref 13.0–17.0)
MCH: 32.4 pg (ref 26.0–34.0)
MCHC: 32.3 g/dL (ref 30.0–36.0)
MCV: 100.4 fL — ABNORMAL HIGH (ref 80.0–100.0)
Platelets: 215 10*3/uL (ref 150–400)
RBC: 2.59 MIL/uL — ABNORMAL LOW (ref 4.22–5.81)
RDW: 15.8 % — ABNORMAL HIGH (ref 11.5–15.5)
WBC: 10.3 10*3/uL (ref 4.0–10.5)
nRBC: 0 % (ref 0.0–0.2)

## 2024-01-03 LAB — VITAMIN B12: Vitamin B-12: 2456 pg/mL — ABNORMAL HIGH (ref 180–914)

## 2024-01-03 LAB — MAGNESIUM: Magnesium: 2.2 mg/dL (ref 1.7–2.4)

## 2024-01-03 LAB — FOLATE: Folate: 33 ng/mL (ref >5.9)

## 2024-01-03 MED ORDER — VITAMIN D (ERGOCALCIFEROL) 1.25 MG (50000 UNIT) PO CAPS
50000.0000 [IU] | ORAL_CAPSULE | ORAL | Status: DC
  Administered 2024-01-03 – 2024-01-10 (×2): 50000 [IU] via ORAL
  Filled 2024-01-03 (×2): qty 1

## 2024-01-03 MED ORDER — VITAMIN C 500 MG PO TABS
500.0000 mg | ORAL_TABLET | Freq: Every day | ORAL | Status: DC
  Administered 2024-01-03 – 2024-01-10 (×8): 500 mg via ORAL
  Filled 2024-01-03 (×9): qty 1

## 2024-01-03 MED ORDER — DICLOFENAC SODIUM 1 % EX GEL
2.0000 g | Freq: Two times a day (BID) | CUTANEOUS | Status: DC
  Administered 2024-01-03 – 2024-01-11 (×17): 2 g via TOPICAL
  Filled 2024-01-03: qty 100

## 2024-01-03 MED ORDER — LIDOCAINE 5 % EX PTCH
1.0000 | MEDICATED_PATCH | CUTANEOUS | Status: DC
  Administered 2024-01-03 – 2024-01-11 (×9): 1 via TRANSDERMAL
  Filled 2024-01-03 (×10): qty 1

## 2024-01-03 MED ORDER — POLYSACCHARIDE IRON COMPLEX 150 MG PO CAPS
150.0000 mg | ORAL_CAPSULE | Freq: Every day | ORAL | Status: DC
  Administered 2024-01-03 – 2024-01-10 (×8): 150 mg via ORAL
  Filled 2024-01-03 (×11): qty 1

## 2024-01-03 MED ORDER — APIXABAN 5 MG PO TABS
5.0000 mg | ORAL_TABLET | Freq: Two times a day (BID) | ORAL | Status: DC
  Administered 2024-01-03 – 2024-01-10 (×15): 5 mg via ORAL
  Filled 2024-01-03 (×15): qty 1

## 2024-01-03 MED ORDER — TAMSULOSIN HCL 0.4 MG PO CAPS
0.4000 mg | ORAL_CAPSULE | Freq: Every day | ORAL | Status: DC
  Administered 2024-01-03 – 2024-01-06 (×4): 0.4 mg via ORAL
  Filled 2024-01-03 (×4): qty 1

## 2024-01-03 NOTE — Progress Notes (Signed)
 [facility] Progress Note  Patient: Daniel Carr    FMW:[id number]  DOA: 12/27/2023     Date of Service: the patient was seen and examined on 01/03/2024  Chief Complaint  Patient presents with   Shortness of Breath   Altered Mental Status   Brief hospital course: 89 y.o. Male admitted with Acute Metabolic Encephalopathy, Acute Hypoxic Hypercapnic Respiratory Failure in the setting of Severe Sepsis with Septic Shock due to Enterococcus Faecalis BACTEREMIA due to UTI, transaminitis and AKI requiring intubation and mechanical ventilation, along with initiation of CRRT   This is an 89 yo male who presented to [facility] ER via EMS on 12/31 from [facility] with altered mental status and acute respiratory failure.  Pts family reports he has had worsening bilateral lower extremity edema and decreased urinary output.  His PCP increased his outpatient lasix  dose to 40 mg daily, however his urinary output did not improve.  Pts granddaughter reports he also c/o chills and fevers.  He was recently hospitalized at [facility] on 12/02/23 with a right humerus fracture from a mechanical fall on 12/3 and evaluated by ortho who recommended a sling for comfort and outpatient follow-up.  However, during presentation he was found to have progressive dyspnea; severe pulmonary hypertension; hyponatremia; acute blood loss anemia suspected secondary to right chest wall hematoma and acute kidney injury on chronic kidney disease requiring hospital admission.  Following treatment he was discharged on 12/17 to [facility] for rehab and instructed not to resume his outpatient apixaban  until 12/18.  His daughter reports the pts apixaban  has been resumed as instructed.     ED Course  Upon arrival to the ER pt in severe respiratory distress, hypotensive, and minimally responsive requiring mechanical intubation.  Significant lab results were: Na+ 125/K+ 5.9/chloride 86/BUN 72/creatinine 4.46/calcium 8.7/albumin  2.5/AST 143/ALT  89/troponin 550/lactic acid 2.4/wbc 15.9/hgb 11.2/PT 25.0/INR 2.2/APTT 48.  CXR concerning for possible small right pleural effusion.  He met sepsis criteria and received: 2L saline bolus; vancomycin , and aztreonam .  Despite iv fluid resuscitation pt remained hypotensive requiring levophed  gtt.  PCCM team contacted for ICU  admission.   Significant Hospital Events 12/31: Pt admitted with acute hypercapnic respiratory failure, septic shock, AECOPD, pneumonia suspect aspiration, acute kidney injury, acute on chronic hyponatremia, transaminitis, and acute metabolic encephalopathy mechanical intubation  12/27/22- patient with improved mentation post CRRT.  Reducing his sedatives on MV today.  Sepsis bacteremia, ID on case.  He is oozing blood from IV sites, will order DIC panel and pathology slide review.   12/29/23- patient following verbal communication this am, remains in anasarca. Bacteremia being treated. He has CRRT with now removal of fluid at 100/hr.  Starting NG feeds.  Tracheal aspirate with GPCs in pairs.  ID on case continues with ampicillin  reducing steroids and off levophed  today. 12/30/23- patient for SBT today.  Last day of steroids today. Failed SBT today.  S/p iD evaluation 12/31/23-patient liberated from MV today on BIPAP, required precedex  for aggitation. Remain on ampicillin . Met with daughter and granddaughter few times to review findings and medical plan.  01/01/24- patient improved overnight, weaned of bipap on hfnc, remains edematous but improved on CRRT with 100/h UF, met with family reviewed medical plan, ordering PT/OT for tommorow. Remains on [place] heparin  with CTEPH/chronic DVTs. CRRT stopped due to clotting filter. 01/02/24- Off BiPAP and Precedex , respiratory and mental status improving.  Vasopressors weaned off.  Creatinine increased, Urine output improved, Nephrology to decide on whether to resume CRRT vs trial intermittent HD  vs observation.  Resume outpatient Eliquis  at reduced dose (2.5  mg BID) for chronic bilateral LE DVT's.  Micro data: 12/31: Covid/RSV/Flu PCR>>negative 12/31: Blood cultures>> Enterococcus Faecalis 12/31: MRSA PCR>>negative 1/1: Urine>> Enterococcus Faecalis 1/1: Tracheal aspirate>> normal respiratory flora 1/1: Hepatitis panel>>negative  Transferred from ICU under [facility] service on 01/03/2024  Assessment and Plan:  # Septic Shock ~ RESOLVED # Acute on Chronic HFpEF # Elevated Troponin, suspect demand ischemia  # CTEPH PMHx: HTN, aortic stenosis, PE, HLD Echocardiogram 12/28/23: LVEF 60-65%, indeterminate diastolic parameters, moderate Aortic stenosis -Continuous cardiac monitoring -Maintain MAP >65 -Vasopressors as needed to maintain MAP goal ~ weaned off -HS Troponin peaked at 550 -Volume removal with renal replacement therapy Continue midodrine  5 mg p.o. 3 times daily  # Severe Sepsis  # Enterococcus Faecalis BACTEREMIA # Enterococcus Faecalis UTI # Questionable Pneumonia -Monitor fever curve -Trend WBC's & Procalcitonin -Follow cultures as above -ID is following, appreciate input ~ Continue empiric Ampicillin  pending cultures & sensitivities Continue to biotics for total 2 weeks total 01/10/2024 as per ID, transition to oral antibiotics on discharge  # Acute Hypoxic & Hypercapnic Respiratory Failure # AECOPD -EXTUBATED 1/4 -Supplemental O2 as needed to maintain O2 sats 88 to 92% -BiPAP qhs -Follow intermittent Chest X-ray & ABG as needed -Bronchodilators  -ABX as above -Diuresis as BP and renal function permits ~ Volume removal with Renal Replacement Therapy -Pulmonary toilet as able   # Acute kidney injury suspect secondary to ATN and diuretic therapy  # Hyperkalemia ~ RESOLVED # Acute on chronic hyponatremia ~ RESOLVED # Lactic acidosis  -Monitor I&O's / urinary output -Follow BMP -Ensure adequate renal perfusion -Avoid nephrotoxic agents as able -Replace electrolytes as indicated ~ Pharmacy following for assistance with  electrolyte replacement -Nephrology following, appreciate input ~ Renal Replacement Therapy as per Nephrology   # Previous Right Humerus Fracture # Chronic Bilateral Lower Extremity DVT's -Venous US  of Bilateral LE on 12/31 positive for extensive bilateral non-occlusive wall thickening/chronic DVT extending from level of the bilateral common femoral through the bilateral popliteal veins.  No acute or occlusive DVT within either lower extremity -Venous US  of RUE on 1/1 negative for DVT -Fracture was being managed conservatively outpatient by Ortho ~ continue sling 1/7 increased Eliquis  5 mg p.o. twice daily VTE dose, does not need correction due to renal functions   # Anemia of chronic disease Iron  deficiency, transferrin saturation 12% Continue oral iron  supplement -Monitor for S/Sx of bleeding -Trend CBC -Resume home Eliquis  for AC/VTE Prophylaxis  -Transfuse for Hgb <7   # Hyperglycemia # Hypothyroidism -CBG's ac & qhs; Target range of 140 to 180 -SSI  -Follow ICU Hypo/Hyperglycemia protocol -Continue Synthroid    # Acute Metabolic Encephalopathy ~ IMPROVING CT Head on 12/31 negative for acute intracranial process -Treatment of metabolic derangements as outlined above -Provide supportive care -Promote normal sleep/wake cycle and family presence -Avoid sedating medications as able   # Chronic urinary retention Patient failed voiding trial, Foley catheter was reinserted on 01/03/2024 Started Flomax  Follow voiding trial when patient is ambulatory, continue Foley catheter in the meantime  # Vitamin D  insufficiency: started vitamin D  50,000 units p.o. weekly, follow with PCP to repeat vitamin D  level after 3 to 6 months.   Body mass index is 28.31 kg/m.  Nutrition Problem: Inadequate oral intake Etiology: inability to eat (pt sedated and ventilated) Interventions: Interventions: MVI, Nepro shake, Prostat   Diet: Dysphagia 3 diet with fluid reduction 1.5 L/day DVT  Prophylaxis: Therapeutic Anticoagulation with Eliquis   Advance goals of care discussion: Full code  Family Communication: family was present at bedside, at the time of interview.  The pt provided permission to discuss medical plan with the family. Opportunity was given to ask question and all questions were answered satisfactorily.   Disposition:  Pt is from [facility], admitted with septic shock, still has electrolyte imbalance and respiratory failure, on IV antibiotics, which precludes a safe discharge. Discharge to SNF, when stable, may need few more days to improve.  Subjective: No significant overnight events, patient still has some shortness of breath, denied any other active issues, no chest pain or palpitation.  No abdominal pain, patient was feeling to have a BM during my rounding.  Physical Exam: General: NAD, lying comfortably Appear in no distress, affect appropriate Eyes: PERRLA ENT: Oral Mucosa Clear, moist  Neck: no JVD,  Cardiovascular: S1 and S2 Present, no Murmur,  Respiratory: Equal air entry bilaterally, bibasilar crackles, no wheezing Abdomen: Bowel Sound present, Soft and no tenderness,  Skin: no rashes Extremities: 3-4+ Pedal edema, no calf tenderness Neurologic: without any new focal findings Gait not checked due to patient safety concerns  Vitals:   01/03/24 1400 01/03/24 1500 01/03/24 1600 01/03/24 1722  BP: 120/80 129/69 138/77   Pulse: 94 90 95   Resp: 16 (!) 30 (!) 30   Temp:      TempSrc:      SpO2: 99% 99% 94% 97%  Weight:      Height:        Intake/Output Summary (Last 24 hours) at 01/03/2024 1805 Last data filed at 01/03/2024 1615 Gross per 24 hour  Intake 940 ml  Output 1350 ml  Net -410 ml   Filed Weights   01/01/24 0447 01/02/24 0718 01/03/24 0500  Weight: 96.1 kg 96.4 kg 97.3 kg    Data Reviewed: I have personally reviewed and interpreted daily labs, tele strips, imagings as discussed above. I reviewed all nursing notes,  pharmacy notes, vitals, pertinent old records I have discussed plan of care as described above with RN and patient/family.  CBC: Recent Labs  Lab 12/30/23 0408 12/31/23 0839 01/01/24 0428 01/02/24 0400 01/03/24 0503  WBC 15.1* 16.5* 13.2* 12.8* 10.3  NEUTROABS  --  13.8* 10.8* 10.0*  --   HGB 8.0* 8.8* 9.1* 8.7* 8.4*  HCT 23.2* 26.2* 28.3* 27.4* 26.0*  MCV 89.6 92.9 97.9 100.0 100.4*  PLT 179 212 207 205 215   Basic Metabolic Panel: Recent Labs  Lab 12/30/23 0408 12/30/23 1607 12/31/23 0412 12/31/23 1605 01/01/24 0428 01/01/24 1603 01/02/24 0400 01/02/24 1459 01/03/24 0503  NA 135   < > 135   < > 136 135 137 135 139  K 3.8   < > 3.6   < > 3.5 4.1 4.1 4.6 4.3  CL 102   < > 102   < > 102 101 104 102 106  CO2 26   < > 24   < > 26 25 26  20* 25  GLUCOSE 134*   < > 167*   < > 93 98 111* 117* 95  BUN 40*   < > 49*   < > 40* 41* 49* 50* 47*  CREATININE 1.62*   < > 1.47*   < > 1.15 1.33* 1.75* 1.83* 1.73*  CALCIUM 9.0   < > 9.4   < > 9.4 9.2 8.7* 8.9 8.3*  MG 2.1  --  2.0  --  2.0  --  2.0  --  2.2  PHOS 2.9   < > 2.6   < > 2.4* 5.2* 4.6 4.5 4.3   < > = values in this interval not displayed.    Studies: No results found.  Scheduled Meds:  (feeding supplement) PROSource Plus  30 mL Oral BID BM   apixaban   5 mg Oral BID   vitamin C   500 mg Oral Daily   bisacodyl   10 mg Rectal Once   brimonidine   1 drop Both Eyes TID   Chlorhexidine  Gluconate Cloth  6 each Topical Daily   docusate sodium   100 mg Oral BID   dorzolamide   1 drop Left Eye TID   feeding supplement (NEPRO CARB STEADY)  237 mL Oral BID BM   insulin  aspart  0-5 Units Subcutaneous QHS   insulin  aspart  0-6 Units Subcutaneous TID WC   ipratropium-albuterol   3 mL Nebulization TID   iron  polysaccharides  150 mg Oral Daily   latanoprost   1 drop Both Eyes QHS   levothyroxine   150 mcg Oral Q0600   midodrine   5 mg Oral TID WC   multivitamin  1 tablet Oral QHS   tamsulosin   0.4 mg Oral QPC supper   Vitamin D   (Ergocalciferol )  50,000 Units Oral Q7 days   Continuous Infusions:  ampicillin  (OMNIPEN) IV 2 g (01/03/24 1747)   PrismaSol  BGK 2/3.5 400 mL/hr at 01/01/24 0647   PrismaSol  BGK 2/3.5 400 mL/hr at 01/01/24 0649   PrismaSol  BGK 2/3.5 1,000 mL/hr at 01/01/24 0643   PRN Meds: heparin , ipratropium-albuterol , mouth rinse, oxyCODONE , polyethylene glycol, sodium chloride   Time spent: 55 minutes  Author: ELVAN SOR. MD [facility] 01/03/2024 6:05 PM  To reach On-call, see care teams to locate the attending and reach out to them via [email or web address]. If 7PM-7AM, please contact night-coverage If you still have difficulty reaching the attending provider, please page the [facility] (Director on Call) for [facility] on amion for assistance.

## 2024-01-03 NOTE — Consult Note (Signed)
 Consultation Note Date: 01/03/2024   Patient Name: Daniel Carr  DOB: 04-Apr-1935  MRN: [id number]  Age / Sex: 89 y.o., male  PCP: Gasper Nancyann BRAVO, MD Referring Physician: Von Bellis, MD  Reason for Consultation: Establishing goals of care  HPI/Patient Profile: This is an 89 yo male who presented to [facility] via EMS on 12/31 from [facility] with altered mental status and acute respiratory failure.  Pts family reports he has had worsening bilateral lower extremity edema and decreased urinary output.  His PCP increased his outpatient lasix  dose to 40 mg daily, however his urinary output did not improve.  Pts granddaughter reports he also c/o chills and fevers.  He was recently hospitalized at [facility] on 12/02/23 with a right humerus fracture from a mechanical fall on 12/3 and evaluated by ortho who recommended a sling for comfort and outpatient follow-up.  However, during presentation he was found to have progressive dyspnea; severe pulmonary hypertension; hyponatremia; acute blood loss anemia suspected secondary to right chest wall hematoma and acute kidney injury on chronic kidney disease requiring hospital admission.  Following treatment he was discharged on 12/17 to [facility] for rehab and instructed not to resume his outpatient apixaban  until 12/18.  His daughter reports the pts apixaban  has been resumed as instructed.    Clinical Assessment and Goals of Care: Notes and labs reviewed.  In to see patient.  He is currently resting in bed with eyes closed and does not speak upon my entry.  His daughter is at bedside.  She discusses that patient is married, and currently wife is at home.  She states her mother has advanced dementia and has several private duty home health aides, and is under hospice care.  She states her mother has been bedbound for several years now and has required the paid caregivers to provide  care for her as patient has been unable to do so.  She discusses that in 2021 patient had a knee surgery and developed a PE.  She states he went to cardiac rehab, but his status continued to decline and his shortness of breath increased where he required assistance.  She discusses this fall on 12/ 3 and subsequent hospitalization for a total of 12 days.  She discusses his deconditioning and needing to go to rehab.  She states he was admitted to rehab around the holidays and because of this, received very little rehab.  She states she and her family have very strong faith and she believes that God can heal him.  We discussed his status further.  She states her goal overall is for him to have the ability to stand, pivot and sit, and she is willing to hire aides as needed to help care for him as well.  Patient interjected that he wanted to try to return to baseline functioning.  Daughter discussed the importance of working with PT/OT, even when he does not want to do so.  Patient states he understands if he does not return to prior level of functioning  and patient would like to continue all life-prolonging care at this time.  He is aware he is a candidate for hospice level care if he should choose this route.  During my visit he was noted to have squinting and difficulty with opening his eyes.  No redness or discharge noted.  He states he uses eye lubrication at home in addition to his glaucoma medications.  SUMMARY OF RECOMMENDATIONS   PMT will follow Would recommend artificial tears to help with dryness.   Prognosis:  Poor       Primary Diagnoses: Present on Admission:  Acute hypoxic respiratory failure (HCC)   I have reviewed the medical record, interviewed the patient and family, and examined the patient. The following aspects are pertinent.  Past Medical History:  Diagnosis Date   Arthritis    childhood infection    chicken pox, measels, and mumps   Glaucoma    Hypertension     Hypothyroidism    Sinus problem    Ulnar neuropathy    bilateral   Social History   Socioeconomic History   Marital status: Married    Spouse name: Not on file   Number of children: 1   Years of education: college   Highest education level: Some college, no degree  Occupational History   Occupation: Retired  Tobacco Use   Smoking status: Former    Current packs/day: 0.00    Average packs/day: 0.3 packs/day for 6.0 years (1.5 ttl pk-yrs)    Types: Cigarettes    Start date: 11/14/1963    Quit date: 11/13/1969    Years since quitting: 54.1   Smokeless tobacco: Never   Tobacco comments:    quit in 1970's  Vaping Use   Vaping status: Never Used  Substance and Sexual Activity   Alcohol  use: No    Alcohol /week: 0.0 standard drinks of alcohol    Drug use: No   Sexual activity: Yes  Other Topics Concern   Not on file  Social History Narrative   Not on file   Social Drivers of Health   Financial Resource Strain: Low Risk  (11/22/2023)   Received from [facility]   Overall Financial Resource Strain (CARDIA)    Difficulty of Paying Living Expenses: Not hard at all  Food Insecurity: Patient Unable To Answer (12/28/2023)   Hunger Vital Sign    Worried About Running Out of Food in the Last Year: Patient unable to answer    Ran Out of Food in the Last Year: Patient unable to answer  Transportation Needs: Patient Unable To Answer (12/28/2023)   PRAPARE - Transportation    Lack of Transportation (Medical): Patient unable to answer    Lack of Transportation (Non-Medical): Patient unable to answer  Physical Activity: Inactive (09/25/2023)   Exercise Vital Sign    Days of Exercise per Week: 0 days    Minutes of Exercise per Session: 0 min  Stress: No Stress Concern Present (09/25/2023)   [employer] of Occupational Health - Occupational Stress Questionnaire    Feeling of Stress : Not at all  Social Connections: Unknown (12/28/2023)   Social Connection and  Isolation Panel [NHANES]    Frequency of Communication with Friends and Family: Patient unable to answer    Frequency of Social Gatherings with Friends and Family: Patient unable to answer    Attends Religious Services: Patient unable to answer    Active Member of Clubs or Organizations: No    Attends Banker Meetings: Never  Marital Status: Married   Family History  Problem Relation Age of Onset   Stroke Sister    Hypertension Mother    Stroke Father    Hypertension Father    Scheduled Meds:  (feeding supplement) PROSource Plus  30 mL Oral BID BM   apixaban   5 mg Oral BID   vitamin C   500 mg Oral Daily   bisacodyl   10 mg Rectal Once   brimonidine   1 drop Both Eyes TID   Chlorhexidine  Gluconate Cloth  6 each Topical Daily   docusate sodium   100 mg Oral BID   dorzolamide   1 drop Left Eye TID   feeding supplement (NEPRO CARB STEADY)  237 mL Oral BID BM   insulin  aspart  0-5 Units Subcutaneous QHS   insulin  aspart  0-6 Units Subcutaneous TID WC   ipratropium-albuterol   3 mL Nebulization TID   iron  polysaccharides  150 mg Oral Daily   latanoprost   1 drop Both Eyes QHS   levothyroxine   150 mcg Oral Q0600   midodrine   5 mg Oral TID WC   multivitamin  1 tablet Oral QHS   tamsulosin   0.4 mg Oral QPC supper   Vitamin D  (Ergocalciferol )  50,000 Units Oral Q7 days   Continuous Infusions:  ampicillin  (OMNIPEN) IV Stopped (01/03/24 1252)   PrismaSol  BGK 2/3.5 400 mL/hr at 01/01/24 9352   PrismaSol  BGK 2/3.5 400 mL/hr at 01/01/24 9350   PrismaSol  BGK 2/3.5 1,000 mL/hr at 01/01/24 0643   PRN Meds:.heparin , ipratropium-albuterol , mouth rinse, oxyCODONE , polyethylene glycol, sodium chloride  Medications Prior to Admission:  Prior to Admission medications   Medication Sig Start Date End Date Taking? Authorizing Provider  acetaminophen  (TYLENOL ) 500 MG tablet Take 1,000 mg by mouth every 8 (eight) hours. 12/13/23  Yes [provider]  apixaban  (ELIQUIS ) 5 MG TABS  tablet Take 5 mg by mouth 2 (two) times daily. 12/14/23  Yes [provider]  azelastine  (ASTELIN ) 0.1 % nasal spray Place 2 sprays into both nostrils 2 (two) times daily. 11/09/19  Yes [provider]  brimonidine  (ALPHAGAN ) 0.2 % ophthalmic solution Place 1 drop into both eyes 3 (three) times daily. 03/26/15  Yes [provider]  Cholecalciferol (VITAMIN D3) 10 MCG (400 UNIT) tablet Take 800 Units by mouth daily. 12/14/23  Yes [provider]  diclofenac  Sodium (VOLTAREN ) 1 % GEL Apply 2 g topically 4 (four) times daily. 12/13/23 12/12/24 Yes [provider]  dorzolamide  (TRUSOPT ) 2 % ophthalmic solution Place 1 drop into the left eye 3 (three) times daily. 11/08/21  Yes [provider]  dutasteride  (AVODART ) 0.5 MG capsule Take 1 capsule by mouth once daily 12/23/22  Yes [name], Nancyann BRAVO, MD  ferrous sulfate  325 (65 FE) MG tablet Take 325 mg by mouth daily.   Yes [provider]  furosemide  (LASIX ) 20 MG tablet Take 20 mg by mouth daily.   Yes [provider]  hydroxychloroquine (PLAQUENIL) 200 MG tablet Take 200 mg by mouth daily. 09/07/23  Yes [provider]  latanoprost  (XALATAN ) 0.005 % ophthalmic solution  03/25/22  Yes [provider]  levocetirizine (XYZAL ) 5 MG tablet Take 5 mg by mouth every evening.   Yes [provider]  levothyroxine  (SYNTHROID ) 150 MCG tablet Take 1 tablet by mouth once daily 12/23/22  Yes [name], Nancyann BRAVO, MD  lidocaine  4 % Place 1 patch onto the skin daily. 12/13/23  Yes [provider]  lovastatin  (MEVACOR ) 40 MG tablet Take 1 tablet by mouth once  daily 05/09/23  Yes Gasper Nancyann BRAVO, MD  melatonin 3 MG TABS tablet Take 3 mg by mouth at bedtime. 12/13/23  Yes [provider]  mirtazapine  (REMERON ) 15 MG tablet Take 15 mg by mouth at bedtime. 12/13/23  Yes [provider]  Multiple Vitamin (DAILY VITAMIN PO) Take 1 tablet by mouth daily.   Yes  [provider]  oxyCODONE  (OXY IR/ROXICODONE ) 5 MG immediate release tablet Take 2.5 mg by mouth every 6 (six) hours as needed for severe pain (pain score 7-10) or moderate pain (pain score 4-6).   Yes [provider]  polyethylene glycol (MIRALAX  / GLYCOLAX ) 17 g packet Take 17 g by mouth 2 (two) times daily. 12/13/23 01/12/24 Yes [provider]  senna (SENOKOT) 8.6 MG tablet Take 2 tablets by mouth 2 (two) times daily. 12/13/23 01/12/24 Yes [provider]  simethicone (MYLICON) 80 MG chewable tablet Chew 80 mg by mouth every 6 (six) hours as needed for flatulence. 12/13/23 01/12/24 Yes [provider]  sodium chloride  1 g tablet Take 1 g by mouth daily.   Yes [provider]  umeclidinium-vilanterol (ANORO ELLIPTA) 62.5-25 MCG/ACT AEPB Inhale 1 puff into the lungs daily. 12/13/23  Yes [provider]  albuterol  (VENTOLIN  HFA) 108 (90 Base) MCG/ACT inhaler SMARTSIG:2 Puff(s) By Mouth As Directed Patient not taking: Reported on 12/27/2023    [provider]  DULoxetine  (CYMBALTA ) 30 MG capsule Take 30 mg by mouth daily. 09/13/23 12/11/23  [provider]  pregabalin  (LYRICA ) 50 MG capsule Take 1 capsule (50 mg total) by mouth 2 (two) times daily. Patient not taking: Reported on 12/27/2023 09/28/23   Gasper Nancyann BRAVO, MD   No Known Allergies Review of Systems  Eyes:        Complains of bilateral eye irritation    Physical Exam Pulmonary:     Effort: Pulmonary effort is normal.  Neurological:     Mental Status: He is alert.     Vital Signs: BP 120/80   Pulse 94   Temp 98.2 F (36.8 C) (Oral)   Resp 16   Ht 6' 0.99 (1.854 m)   Wt 97.3 kg   SpO2 99%   BMI 28.31 kg/m  Pain Scale: 0-10   Pain Score: 0-No pain   SpO2: SpO2: 99 % O2 Device:SpO2: 99 % O2 Flow Rate: .O2 Flow Rate (L/min): 3 L/min  IO: Intake/output summary:  Intake/Output Summary (Last 24 hours) at 01/03/2024 1541 Last data filed at  01/03/2024 1452 Gross per 24 hour  Intake 1140 ml  Output 1000 ml  Net 140 ml    LBM: Last BM Date : 01/03/24 Baseline Weight: Weight: 123.7 kg Most recent weight: Weight: 97.3 kg        Signed by: Camelia Lewis, NP   Please contact Palliative Medicine Team phone at [phone] for questions and concerns.  For individual provider: See Tracey

## 2024-01-03 NOTE — Plan of Care (Signed)
  Problem: Education: Goal: Knowledge of General Education information will improve Description: Including pain rating scale, medication(s)/side effects and non-pharmacologic comfort measures Outcome: Progressing   Problem: Health Behavior/Discharge Planning: Goal: Ability to manage health-related needs will improve Outcome: Progressing   Problem: Clinical Measurements: Goal: Ability to maintain clinical measurements within normal limits will improve Outcome: Progressing Goal: Will remain free from infection Outcome: Progressing Goal: Respiratory complications will improve Outcome: Progressing   Problem: Fluid Volume: Goal: Ability to maintain a balanced intake and output will improve Outcome: Progressing   Problem: Tissue Perfusion: Goal: Adequacy of tissue perfusion will improve Outcome: Progressing

## 2024-01-03 NOTE — Consult Note (Signed)
 PHARMACY CONSULT NOTE - ELECTROLYTES  Pharmacy Consult for Electrolyte Monitoring and Replacement   Recent Labs: Potassium (mmol/L)  Date Value  01/03/2024 4.3   Magnesium  (mg/dL)  Date Value  98/92/7974 2.2   Calcium (mg/dL)  Date Value  98/92/7974 8.3 (L)   Albumin  (g/dL)  Date Value  98/92/7974 2.2 (L)  09/28/2023 4.0   Phosphorus (mg/dL)  Date Value  98/92/7974 4.3   Sodium (mmol/L)  Date Value  01/03/2024 139  09/28/2023 132 (L)   Height: 6' 0.99 (185.4 cm) Weight: 97.3 kg (214 lb 8.1 oz) IBW/kg (Calculated) : 79.88 Estimated Creatinine Clearance: 37.7 mL/min (A) (by C-G formula based on SCr of 1.73 mg/dL (H)).  Assessment  Daniel Carr is a 89 y.o. male presenting with acute on chronic respiratory failure. PMH significant for HTN, hypothyroidism, HFpEF, COPD, aortic stenosis, Hx PE / CTEPH. Pharmacy has been consulted to monitor and replace electrolytes. Patient initiated on CRRT on 12/28/2023 now stopped   Goal of Therapy: Electrolytes within normal limits  Plan:  --no electrolyte replacement warranted for today --Patient is now off CRRT: recheck electrolytes in am  Thank you for allowing pharmacy to be a part of this patient's care.  Adriana JONETTA Bolster, PharmD Clinical Pharmacist 01/03/2024 7:03 AM

## 2024-01-03 NOTE — Evaluation (Signed)
 Physical Therapy Evaluation Patient Details Name: Daniel Carr MRN: [id number] DOB: 1935-11-08 Today's Date: 01/03/2024  History of Present Illness  89 y.o. Male admitted with Acute Metabolic Encephalopathy, Acute Hypoxic Hypercapnic Respiratory Failure in the setting of Severe Sepsis with Septic Shock due to Enterococcus Faecalis BACTEREMIA due to UTI, transaminitis and AKI requiring intubation and mechanical ventilation, along with initiation of CRRT.  Pt had fall with prox R humerus fx 12/3, was hospitalized at [facility], d/c to [facility]) 12/17.  Clinical Impression  Pt initially hesitant to participate with PT but with cuing did agree to some light activity.  Pt's RR remained in the 30-40s range most of the time but he could occasionally take slower, fuller breaths with cuing.  He displayed significant weakness and stiffness in b/l LEs but with AAROM managed some LE exercises in bed.  Needed heavy +2 assist to get to EOB, and constant assist to maintain balance but vitals remained relatively stable with ~5 minutes of supported EOB sitting.  Pt is functionally very limited and will require further PT here and upon discharge.      If plan is discharge home, recommend the following: Two people to help with walking and/or transfers;Two people to help with bathing/dressing/bathroom;Assistance with cooking/housework;Assistance with feeding;Assist for transportation   Can travel by private vehicle   No    Equipment Recommendations  (TBD)  Recommendations for Other Services       Functional Status Assessment Patient has had a recent decline in their functional status and demonstrates the ability to make significant improvements in function in a reasonable and predictable amount of time.     Precautions / Restrictions Precautions Precautions: Fall Restrictions Weight Bearing Restrictions Per Provider Order: Yes RUE Weight Bearing Per Provider Order: Non weight bearing Other  Position/Activity Restrictions: no formal orders but de facto NWBing R UE      Mobility  Bed Mobility Overal bed mobility: Needs Assistance Bed Mobility: Supine to Sit, Sit to Supine     Supine to sit: Mod assist, Max assist, +2 for physical assistance Sit to supine: Max assist, +2 for physical assistance   General bed mobility comments: Pt c/o pain in L shoulder during transition but did tolerate sitting relatively well  at EOB with close minA to insure balance.  He tended to lean back, struggled to scoot/self adjust but with +2 supervision and constant minA maintained EOB for a few minutes before needing to lay back down.    Transfers                   General transfer comment: deferred for safety    Ambulation/Gait                  Stairs            Wheelchair Mobility     Tilt Bed    Modified Rankin (Stroke Patients Only)       Balance Overall balance assessment: Needs assistance Sitting-balance support: Single extremity supported Sitting balance-Leahy Scale: Fair Sitting balance - Comments: Pt with consistent retro lean and need for constant min to mod A to maintain balance at EOB, improved transiently with cuing for scooting and weight shifts                                     Pertinent Vitals/Pain Pain Assessment Pain Assessment: Faces Pain Score: 8  (reports minimal  pain at rest but c/o severe R shoulder pain with even very guarded, gentle movement) Facial Expression: Grimacing    Home Living Family/patient expects to be discharged to:: Skilled nursing facility                   Additional Comments: expected return to [facility], pt's daughter lives with him and his wife, he uses a SPC at baseline and does some limited driving/community ambulation    Prior Function Prior Level of Function : Driving;Needs assist             Mobility Comments: able to do limited ambulation with SPC, reports he has  not walked more than ~20 ft since early Dec before his fall       Extremity/Trunk Assessment   Upper Extremity Assessment Upper Extremity Assessment: Generalized weakness (R UE in sling, very pain limited)    Lower Extremity Assessment Lower Extremity Assessment: Generalized weakness (no against gravity AROM, b/l knees lack flexion >90)       Communication   Communication Communication: No apparent difficulties  Cognition Arousal: Lethargic Behavior During Therapy: WFL for tasks assessed/performed Overall Cognitive Status: Within Functional Limits for tasks assessed                                          General Comments General comments (skin integrity, edema, etc.): Pt hesitant to do a lot, c/o a lot of R shoulder pain with any movement, RR remains high t/o the effort.    Exercises General Exercises - Lower Extremity Ankle Circles/Pumps: AROM, AAROM, 10 reps Quad Sets: Strengthening, 10 reps Heel Slides: AAROM, AROM, 10 reps (with resisted leg extensions)   Assessment/Plan    PT Assessment Patient needs continued PT services  PT Problem List Decreased strength;Decreased range of motion;Decreased activity tolerance;Decreased balance;Decreased mobility;Decreased knowledge of use of DME;Decreased safety awareness;Cardiopulmonary status limiting activity;Pain       PT Treatment Interventions Gait training;DME instruction;Functional mobility training;Therapeutic activities;Therapeutic exercise;Balance training;Neuromuscular re-education;Patient/family education    PT Goals (Current goals can be found in the Care Plan section)  Acute Rehab PT Goals Patient Stated Goal: get strong enough to walk PT Goal Formulation: With patient Time For Goal Achievement: 01/16/24 Potential to Achieve Goals: Good    Frequency Min 1X/week     Co-evaluation   Reason for Co-Treatment: Complexity of the patient's impairments (multi-system involvement);To address  functional/ADL transfers   OT goals addressed during session: ADL's and self-care       AM-PAC PT 6 Clicks Mobility  Outcome Measure Help needed turning from your back to your side while in a flat bed without using bedrails?: A Lot Help needed moving from lying on your back to sitting on the side of a flat bed without using bedrails?: Total Help needed moving to and from a bed to a chair (including a wheelchair)?: Total Help needed standing up from a chair using your arms (e.g., wheelchair or bedside chair)?: Total Help needed to walk in hospital room?: Total Help needed climbing 3-5 steps with a railing? : Total 6 Click Score: 7    End of Session Equipment Utilized During Treatment: Oxygen  Activity Tolerance: Patient tolerated treatment well;Patient limited by fatigue Patient left: in bed;with call bell/phone within reach Nurse Communication: Mobility status PT Visit Diagnosis: Muscle weakness (generalized) (M62.81);Difficulty in walking, not elsewhere classified (R26.2);Pain;Unsteadiness on feet (R26.81) Pain - Right/Left:  Right Pain - part of body: Shoulder    Time: 1310-1345 PT Time Calculation (min) (ACUTE ONLY): 35 min   Charges:   PT Evaluation $PT Eval Moderate Complexity: 1 Mod PT Treatments $Therapeutic Exercise: 8-22 mins PT General Charges $$ ACUTE PT VISIT: 1 Visit         Carmin JONELLE Deed, DPT 01/03/2024, 2:41 PM

## 2024-01-03 NOTE — Evaluation (Signed)
 Occupational Therapy Evaluation Patient Details Name: Daniel Carr MRN: [id number] DOB: August 04, 1935 Today's Date: 01/03/2024   History of Present Illness 89 y.o. Male admitted with Acute Metabolic Encephalopathy, Acute Hypoxic Hypercapnic Respiratory Failure in the setting of Severe Sepsis with Septic Shock due to Enterococcus Faecalis BACTEREMIA due to UTI, transaminitis and AKI requiring intubation and mechanical ventilation, along with initiation of CRRT.  Pt had fall with prox R humerus fx 12/3, was hospitalized at [facility], d/c to [facility] [employer]) 12/17.   Clinical Impression   Chart reviewed, pt in room with PT present. Pt is oriented to self and place, grossly to situation. Increased time for all simple directions and encouragement for participation required. PTA was at rehab following a fall with R humerus fx 12/3. RUE in sling throughout. Pt presents with deficits in strength, endurance, activity tolerance, balance, cognition affecting safe and optimal ADL completion. Please see further details below, generally pt required MAX A +1-2 for ADL and bed mobility, MIN A for sitting on edge of bed. Pt will benefit from acute OT to address deficits and to facilitate optimal ADL performance. Pt is left in bed, all needs met. OT will follow acutely.       If plan is discharge home, recommend the following: Two people to help with walking and/or transfers;Two people to help with bathing/dressing/bathroom;Direct supervision/assist for medications management;Assist for transportation;Supervision due to cognitive status;Assistance with cooking/housework;Help with stairs or ramp for entrance;Direct supervision/assist for financial management;Assistance with feeding    Functional Status Assessment  Patient has had a recent decline in their functional status and demonstrates the ability to make significant improvements in function in a reasonable and predictable amount of time.  Equipment Recommendations   Other (comment) (defer)    Recommendations for Other Services       Precautions / Restrictions Precautions Precautions: Fall Restrictions Weight Bearing Restrictions Per Provider Order: Yes RUE Weight Bearing Per Provider Order: Non weight bearing Other Position/Activity Restrictions: no formal orders but de facto NWBing R UE      Mobility Bed Mobility Overal bed mobility: Needs Assistance Bed Mobility: Supine to Sit, Sit to Supine     Supine to sit: Mod assist, Max assist, +2 for physical assistance Sit to supine: Max assist, +2 for physical assistance   General bed mobility comments: frequent vcs for technique    Transfers                   General transfer comment: deferred for safety      Balance Overall balance assessment: Needs assistance Sitting-balance support: Single extremity supported Sitting balance-Leahy Scale: Poor Sitting balance - Comments: at least MIN A for static sitting blance Postural control: Posterior lean                                 ADL either performed or assessed with clinical judgement   ADL Overall ADL's : Needs assistance/impaired     Grooming: Maximal assistance;Wash/dry face;Bed level           Upper Body Dressing : Maximal assistance;Bed level Upper Body Dressing Details (indicate cue type and reason): adjust sling, gown Lower Body Dressing: Total assistance Lower Body Dressing Details (indicate cue type and reason): gown     Toileting- Clothing Manipulation and Hygiene: Maximal assistance;Total assistance;Bed level         General ADL Comments: limited by fatigue on this date     Vision  Patient Visual Report: No change from baseline Additional Comments: will continue to assess     Perception         Praxis         Pertinent Vitals/Pain Pain Assessment Pain Assessment: CPOT Facial Expression: Grimacing Body Movements: Protection Muscle Tension: Tense, rigid Compliance with  ventilator (intubated pts.): N/A Vocalization (extubated pts.): Sighing, moaning CPOT Total: 5 Pain Intervention(s): Monitored during session, Repositioned     Extremity/Trunk Assessment Upper Extremity Assessment Upper Extremity Assessment: RUE deficits/detail;Right hand dominant;Generalized weakness RUE Deficits / Details: RUE in sling/immobilizer able to wiggle fingers, RUE: Unable to fully assess due to pain;Unable to fully assess due to immobilization   Lower Extremity Assessment Lower Extremity Assessment: Generalized weakness;Defer to PT evaluation       Communication Communication Communication: No apparent difficulties   Cognition Arousal: Lethargic Behavior During Therapy: Flat affect Overall Cognitive Status: Impaired/Different from baseline Area of Impairment: Attention, Following commands, Safety/judgement, Awareness, Problem solving                   Current Attention Level: Sustained   Following Commands: Follows one step commands with increased time Safety/Judgement: Decreased awareness of safety Awareness: Emergent Problem Solving: Slow processing, Decreased initiation, Requires verbal cues, Requires tactile cues       General Comments  spo2 to 86% on 4L seated at edge of bed, >90% on 4L at rest in bed, RR high throughout    Exercises Other Exercises Other Exercises: edu pt re role of OT, role of rehab, importance of continued mobility   Shoulder Instructions      Home Living Family/patient expects to be discharged to:: Skilled nursing facility                                 Additional Comments: expected return to [facility], pt's daughter lives with him and his wife, he uses a SPC at baseline and does some limited driving/community ambulation      Prior Functioning/Environment Prior Level of Function : Driving;Needs assist             Mobility Comments: able to do limited ambulation with SPC, reports he has not  walked more than ~20 ft since early Dec before his fall          OT Problem List: Decreased strength;Decreased activity tolerance;Decreased range of motion;Impaired balance (sitting and/or standing);Decreased cognition;Decreased knowledge of precautions;Impaired UE functional use;Decreased knowledge of use of DME or AE;Increased edema;Decreased coordination;Decreased safety awareness      OT Treatment/Interventions: Self-care/ADL training;Balance training;Therapeutic activities;DME and/or AE instruction;Therapeutic exercise;Neuromuscular education;Modalities;Energy conservation;Patient/family education    OT Goals(Current goals can be found in the care plan section) Acute Rehab OT Goals Patient Stated Goal: improve strength OT Goal Formulation: With patient/family Time For Goal Achievement: 01/17/24 Potential to Achieve Goals: Fair ADL Goals Pt Will Perform Grooming: with mod assist;sitting Pt Will Perform Upper Body Dressing: with mod assist;sitting Pt Will Perform Lower Body Dressing: with mod assist;sit to/from stand;sitting/lateral leans Pt Will Transfer to Toilet: with mod assist;stand pivot transfer Pt Will Perform Toileting - Clothing Manipulation and hygiene: with mod assist;sit to/from stand;sitting/lateral leans  OT Frequency: Min 1X/week    Co-evaluation PT/OT/SLP Co-Evaluation/Treatment: Yes Reason for Co-Treatment: Complexity of the patient's impairments (multi-system involvement);To address functional/ADL transfers   OT goals addressed during session: ADL's and self-care      AM-PAC OT 6 Clicks Daily Activity  Outcome Measure Help from another person eating meals?: A Lot Help from another person taking care of personal grooming?: A Lot Help from another person toileting, which includes using toliet, bedpan, or urinal?: Total Help from another person bathing (including washing, rinsing, drying)?: A Lot Help from another person to put on and taking off regular  upper body clothing?: A Lot Help from another person to put on and taking off regular lower body clothing?: Total 6 Click Score: 10   End of Session Equipment Utilized During Treatment: Oxygen  Nurse Communication: Mobility status  Activity Tolerance: Patient limited by fatigue;Patient limited by lethargy Patient left: in bed;with call bell/phone within reach;with family/visitor present  OT Visit Diagnosis: Other abnormalities of gait and mobility (R26.89);Muscle weakness (generalized) (M62.81)                Time: 1335-1350 OT Time Calculation (min): 15 min Charges:  OT General Charges $OT Visit: 1 Visit OT Evaluation $OT Eval High Complexity: 1 High  Therisa Sheffield, OTD OTR/L  01/03/24, 2:47 PM

## 2024-01-03 NOTE — Progress Notes (Signed)
 [facility]  PROGRESS NOTE   Subjective:   Patient's nephew is in the room today.  Foley catheter has been removed. Patient is tachycardic  Objective:  Vital signs: Blood pressure 138/77, pulse 95, temperature 98.2 F (36.8 C), temperature source Oral, resp. rate (!) 30, height 6' 0.99 (1.854 m), weight 97.3 kg, SpO2 94%.  Intake/Output Summary (Last 24 hours) at 01/03/2024 1636 Last data filed at 01/03/2024 1615 Gross per 24 hour  Intake 940 ml  Output 1350 ml  Net -410 ml   Filed Weights   01/01/24 0447 01/02/24 0718 01/03/24 0500  Weight: 96.1 kg 96.4 kg 97.3 kg     Physical Exam: General:  No acute distress  Head:  Normocephalic, atraumatic. Moist oral mucosal membranes  Eyes:  Anicteric  Lungs:   Clear to auscultation, normal effort  Heart:  S1S2 no rubs  Abdomen:   Soft, nontender, bowel sounds present  Extremities:  peripheral edema present.  Neurologic: Lethargic, resting quietly  Skin:  No lesions  Access:     Basic Metabolic Panel: Recent Labs  Lab 12/30/23 0408 12/30/23 1607 12/31/23 0412 12/31/23 1605 01/01/24 0428 01/01/24 1603 01/02/24 0400 01/02/24 1459 01/03/24 0503  NA 135   < > 135   < > 136 135 137 135 139  K 3.8   < > 3.6   < > 3.5 4.1 4.1 4.6 4.3  CL 102   < > 102   < > 102 101 104 102 106  CO2 26   < > 24   < > 26 25 26  20* 25  GLUCOSE 134*   < > 167*   < > 93 98 111* 117* 95  BUN 40*   < > 49*   < > 40* 41* 49* 50* 47*  CREATININE 1.62*   < > 1.47*   < > 1.15 1.33* 1.75* 1.83* 1.73*  CALCIUM 9.0   < > 9.4   < > 9.4 9.2 8.7* 8.9 8.3*  MG 2.1  --  2.0  --  2.0  --  2.0  --  2.2  PHOS 2.9   < > 2.6   < > 2.4* 5.2* 4.6 4.5 4.3   < > = values in this interval not displayed.   GFR: Estimated Creatinine Clearance: 37.7 mL/min (A) (by C-G formula based on SCr of 1.73 mg/dL (H)).  Liver Function Tests: Recent Labs  Lab 12/28/23 0427 12/28/23 1556 01/01/24 0428 01/01/24 1603 01/02/24 0400 01/02/24 1459 01/03/24 0503   AST 72*  --   --   --   --   --   --   ALT 59*  --   --   --   --   --   --   ALKPHOS 63  --   --   --   --   --   --   BILITOT 0.9  --   --   --   --   --   --   PROT 5.4*  --   --   --   --   --   --   ALBUMIN  2.1*  2.0*   < > 2.5* 2.4* 2.3* 2.5* 2.2*   < > = values in this interval not displayed.   No results for input(s): LIPASE, AMYLASE in the last 168 hours.  Recent Labs  Lab 12/27/23 1650 12/28/23 0427  AMMONIA 99* 31    CBC: Recent Labs  Lab 12/30/23 0408 12/31/23 9160 01/01/24 9571  01/02/24 0400 01/03/24 0503  WBC 15.1* 16.5* 13.2* 12.8* 10.3  NEUTROABS  --  13.8* 10.8* 10.0*  --   HGB 8.0* 8.8* 9.1* 8.7* 8.4*  HCT 23.2* 26.2* 28.3* 27.4* 26.0*  MCV 89.6 92.9 97.9 100.0 100.4*  PLT 179 212 207 205 215     HbA1C: Hgb A1c MFr Bld  Date/Time Value Ref Range Status  12/31/2023 08:39 AM 5.3 4.8 - 5.6 % Final    Comment:    (NOTE)         Prediabetes: 5.7 - 6.4         Diabetes: >6.4         Glycemic control for adults with diabetes: <7.0     Urinalysis: No results for input(s): COLORURINE, LABSPEC, PHURINE, GLUCOSEU, HGBUR, BILIRUBINUR, KETONESUR, PROTEINUR, UROBILINOGEN, NITRITE, LEUKOCYTESUR in the last 72 hours.  Invalid input(s): APPERANCEUR    Imaging: No results found.    Medications:    ampicillin  (OMNIPEN) IV Stopped (01/03/24 1252)   PrismaSol  BGK 2/3.5 400 mL/hr at 01/01/24 0647   PrismaSol  BGK 2/3.5 400 mL/hr at 01/01/24 9350   PrismaSol  BGK 2/3.5 1,000 mL/hr at 01/01/24 9356    (feeding supplement) PROSource Plus  30 mL Oral BID BM   apixaban   5 mg Oral BID   vitamin C   500 mg Oral Daily   bisacodyl   10 mg Rectal Once   brimonidine   1 drop Both Eyes TID   Chlorhexidine  Gluconate Cloth  6 each Topical Daily   docusate sodium   100 mg Oral BID   dorzolamide   1 drop Left Eye TID   feeding supplement (NEPRO CARB STEADY)  237 mL Oral BID BM   insulin  aspart  0-5 Units Subcutaneous QHS   insulin  aspart   0-6 Units Subcutaneous TID WC   ipratropium-albuterol   3 mL Nebulization TID   iron  polysaccharides  150 mg Oral Daily   latanoprost   1 drop Both Eyes QHS   levothyroxine   150 mcg Oral Q0600   midodrine   5 mg Oral TID WC   multivitamin  1 tablet Oral QHS   tamsulosin   0.4 mg Oral QPC supper   Vitamin D  (Ergocalciferol )  50,000 Units Oral Q7 days    Assessment/ Plan:     89 y.o. male with past medical history of hypertension, hypothyroidism, neuropathy, COPD, MGUS, history of PE on apixaban , diastolic heart failure, hyperlipidemia who was admitted on 12/27/2023 for acute hypoxic respiratory failure.   1.  Acute kidney injury/acute metabolic acidosis/hyperkalemia/hyponatremia.  Baseline creatinine 0.9 on 12/13/2023.  CRRT on hold.  Patient is off pressors. Urine output of 1250 cc yesterday.  Serum creatinine slightly improved from 1.83-1.72 without dialysis. We will reevaluate daily for need of further hemodialysis. No acute indication for hemodialysis at present.  We will continue to monitor.   2.  Acute respiratory failure.  Patient is now on oxygen  supplementation by nasal cannula.   3.  Hypotension secondary to systemic shock from sepsis and volume depletion.  Spoke to family at bedside. Labs and medications reviewed. Will continue to follow along with you.   LOS: 7 Saralee Stank, MD [facility] 1/7/20254:36 PM

## 2024-01-04 DIAGNOSIS — G9341 Metabolic encephalopathy: Secondary | ICD-10-CM | POA: Diagnosis not present

## 2024-01-04 DIAGNOSIS — B952 Enterococcus as the cause of diseases classified elsewhere: Secondary | ICD-10-CM

## 2024-01-04 DIAGNOSIS — N390 Urinary tract infection, site not specified: Secondary | ICD-10-CM

## 2024-01-04 DIAGNOSIS — N179 Acute kidney failure, unspecified: Principal | ICD-10-CM

## 2024-01-04 DIAGNOSIS — R7881 Bacteremia: Secondary | ICD-10-CM | POA: Diagnosis not present

## 2024-01-04 DIAGNOSIS — J9601 Acute respiratory failure with hypoxia: Secondary | ICD-10-CM | POA: Diagnosis not present

## 2024-01-04 LAB — CBC
HCT: 27 % — ABNORMAL LOW (ref 39.0–52.0)
Hemoglobin: 8.5 g/dL — ABNORMAL LOW (ref 13.0–17.0)
MCH: 32.3 pg (ref 26.0–34.0)
MCHC: 31.5 g/dL (ref 30.0–36.0)
MCV: 102.7 fL — ABNORMAL HIGH (ref 80.0–100.0)
Platelets: 231 K/uL (ref 150–400)
RBC: 2.63 MIL/uL — ABNORMAL LOW (ref 4.22–5.81)
RDW: 15.9 % — ABNORMAL HIGH (ref 11.5–15.5)
WBC: 8.2 K/uL (ref 4.0–10.5)
nRBC: 0 % (ref 0.0–0.2)

## 2024-01-04 LAB — BASIC METABOLIC PANEL WITH GFR
Anion gap: 11 (ref 5–15)
BUN: 48 mg/dL — ABNORMAL HIGH (ref 8–23)
CO2: 26 mmol/L (ref 22–32)
Calcium: 9 mg/dL (ref 8.9–10.3)
Chloride: 107 mmol/L (ref 98–111)
Creatinine, Ser: 1.77 mg/dL — ABNORMAL HIGH (ref 0.61–1.24)
GFR, Estimated: 37 mL/min — ABNORMAL LOW
Glucose, Bld: 104 mg/dL — ABNORMAL HIGH (ref 70–99)
Potassium: 4.6 mmol/L (ref 3.5–5.1)
Sodium: 144 mmol/L (ref 135–145)

## 2024-01-04 LAB — GLUCOSE, CAPILLARY
Glucose-Capillary: 82 mg/dL (ref 70–99)
Glucose-Capillary: 93 mg/dL (ref 70–99)
Glucose-Capillary: 103 mg/dL — ABNORMAL HIGH (ref 70–99)
Glucose-Capillary: 108 mg/dL — ABNORMAL HIGH (ref 70–99)
Glucose-Capillary: 100 mg/dL — ABNORMAL HIGH (ref 70–99)

## 2024-01-04 LAB — PHOSPHORUS: Phosphorus: 3.1 mg/dL (ref 2.5–4.6)

## 2024-01-04 LAB — MAGNESIUM: Magnesium: 2.3 mg/dL (ref 1.7–2.4)

## 2024-01-04 MED ORDER — MIDODRINE HCL 5 MG PO TABS
10.0000 mg | ORAL_TABLET | Freq: Three times a day (TID) | ORAL | Status: DC
  Administered 2024-01-04 – 2024-01-10 (×20): 10 mg via ORAL
  Filled 2024-01-04 (×19): qty 2

## 2024-01-04 MED ORDER — MIRTAZAPINE 15 MG PO TABS
15.0000 mg | ORAL_TABLET | Freq: Every day | ORAL | Status: DC
  Administered 2024-01-04 – 2024-01-10 (×7): 15 mg via ORAL
  Filled 2024-01-04 (×7): qty 1

## 2024-01-04 MED ORDER — DULOXETINE HCL 30 MG PO CPEP
30.0000 mg | ORAL_CAPSULE | Freq: Every day | ORAL | Status: DC
  Administered 2024-01-04 – 2024-01-10 (×6): 30 mg via ORAL
  Filled 2024-01-04 (×7): qty 1

## 2024-01-04 MED ORDER — FUROSEMIDE 10 MG/ML IJ SOLN
4.0000 mg/h | INTRAVENOUS | Status: DC
  Administered 2024-01-04 – 2024-01-08 (×3): 4 mg/h via INTRAVENOUS
  Filled 2024-01-04 (×4): qty 20

## 2024-01-04 NOTE — NC FL2 (Signed)
 [place]  MEDICAID FL2 LEVEL OF CARE FORM     IDENTIFICATION  Patient Name: Daniel Carr Birthdate: [birth year] Sex: male Admission Date (Current Location): 12/27/2023  [facility] and [place] Number:  Chiropodist and Address:  [facility], [address] [place], [place] [address]      Provider Number: [id number]  Attending Physician Name and Address:  Von Bellis, MD  Relative Name and Phone Number:  Verneita Finder, [phone]    Current Level of Care: Hospital Recommended Level of Care: Skilled Nursing Facility Prior Approval Number:    Date Approved/Denied:   PASRR Number:  [id number]  Discharge Plan: SNF    Current Diagnoses: Patient Active Problem List   Diagnosis Date Noted   Acute hypoxic respiratory failure (HCC) 12/27/2023   Osteoarthrosis, elbow, right 09/28/2023   Macroamylasemia 04/25/2022   Pulmonary embolism (HCC) 01/14/2021   Total knee replacement status 12/24/2020   Systolic murmur 08/25/2020   Allergic rhinitis 02/19/2019   MGUS (monoclonal gammopathy of unknown significance) 11/11/2018   Systolic dysfunction 07/29/2017   Primary osteoarthritis of left knee 07/28/2017   Callus of foot 07/27/2016   Nail dystrophy 07/27/2016   Arthropathia 06/09/2015   Neuropathic ulnar nerve 06/09/2015   Glaucoma    Hypothyroid    Pure hypercholesterolemia    Hypertension, benign    Arthritis, multiple joint involvement    BPH (benign prostatic hyperplasia)    ED (erectile dysfunction)    Nocturia associated with benign prostatic hyperplasia     Orientation RESPIRATION BLADDER Height & Weight     Self, Time, Situation, Place  O2 Indwelling catheter, Continent Weight: 209 lb 3.5 oz (94.9 kg) Height:  6' 0.99 (185.4 cm)  BEHAVIORAL SYMPTOMS/MOOD NEUROLOGICAL BOWEL NUTRITION STATUS      Continent Diet (See discharge instructions)  AMBULATORY STATUS COMMUNICATION OF NEEDS Skin   Extensive Assist Verbally Other (Comment)  (wound on penis and R buttock)                       Personal Care Assistance Level of Assistance  Bathing, Feeding, Dressing Bathing Assistance: Maximum assistance Feeding assistance: Limited assistance Dressing Assistance: Maximum assistance     Functional Limitations Info             SPECIAL CARE FACTORS FREQUENCY  PT (By licensed PT), OT (By licensed OT)     PT Frequency: 5 days/week OT Frequency: 5 days/week            Contractures Contractures Info: Not present    Additional Factors Info  Code Status Code Status Info: Full code             Current Medications (01/04/2024):  This is the current hospital active medication list Current Facility-Administered Medications  Medication Dose Route Frequency Provider Last Rate Last Admin   (feeding supplement) PROSource Plus liquid 30 mL  30 mL Oral BID BM Dgayli, Khabib, MD   30 mL at 01/02/24 1536   ampicillin  (OMNIPEN) 2 g in sodium chloride  0.9 % 100 mL IVPB  2 g Intravenous Q6H Chappell, Alex B, RPH 300 mL/hr at 01/04/24 1208 2 g at 01/04/24 1208   apixaban  (ELIQUIS ) tablet 5 mg  5 mg Oral BID Von Bellis, MD   5 mg at 01/04/24 1022   ascorbic acid  (VITAMIN C ) tablet 500 mg  500 mg Oral Daily Von Bellis, MD   500 mg at 01/04/24 1022   bisacodyl  (DULCOLAX) suppository 10 mg  10 mg Rectal  Once Keene, Jeremiah D, NP       brimonidine  (ALPHAGAN ) 0.2 % ophthalmic solution 1 drop  1 drop Both Eyes TID Nelson, Dana G, NP   1 drop at 01/04/24 1023   Chlorhexidine  Gluconate Cloth 2 % PADS 6 each  6 each Topical Daily Kathrene Almarie Bake, NP   6 each at 01/03/24 9157   diclofenac  Sodium (VOLTAREN ) 1 % topical gel 2 g  2 g Topical BID Von Bellis, MD   2 g at 01/04/24 1021   docusate sodium  (COLACE) capsule 100 mg  100 mg Oral BID Nelson, Dana G, NP   100 mg at 01/03/24 9145   dorzolamide  (TRUSOPT ) 2 % ophthalmic solution 1 drop  1 drop Left Eye TID Nelson, Dana G, NP   1 drop at 01/04/24 1018   DULoxetine   (CYMBALTA ) DR capsule 30 mg  30 mg Oral Daily Grubb, Rodney D, RPH       feeding supplement (NEPRO CARB STEADY) liquid 237 mL  237 mL Oral BID BM Isadora Hose, MD   237 mL at 01/03/24 1433   furosemide  (LASIX ) 200 mg in dextrose  5 % 100 mL (2 mg/mL) infusion  4 mg/hr Intravenous Continuous Von Bellis, MD 2 mL/hr at 01/04/24 1154 4 mg/hr at 01/04/24 1154   heparin  injection 1,000-6,000 Units  1,000-6,000 Units CRRT PRN Kolluru, Sarath, MD       insulin  aspart (novoLOG ) injection 0-5 Units  0-5 Units Subcutaneous QHS Nelson, Dana G, NP       insulin  aspart (novoLOG ) injection 0-6 Units  0-6 Units Subcutaneous TID WC Nelson, Dana G, NP       ipratropium-albuterol  (DUONEB) 0.5-2.5 (3) MG/3ML nebulizer solution 3 mL  3 mL Nebulization Q6H PRN Nelson, Dana G, NP       ipratropium-albuterol  (DUONEB) 0.5-2.5 (3) MG/3ML nebulizer solution 3 mL  3 mL Nebulization TID Isadora Hose, MD   3 mL at 01/04/24 1334   iron  polysaccharides (NIFEREX) capsule 150 mg  150 mg Oral Daily Von Bellis, MD   150 mg at 01/04/24 0855   latanoprost  (XALATAN ) 0.005 % ophthalmic solution 1 drop  1 drop Both Eyes QHS Aleskerov, Fuad, MD   1 drop at 01/03/24 2152   levothyroxine  (SYNTHROID ) tablet 150 mcg  150 mcg Oral Q0600 Grubb, Rodney D, RPH   150 mcg at 01/04/24 0617   lidocaine  (LIDODERM ) 5 % 1 patch  1 patch Transdermal Q24H Von Bellis, MD   1 patch at 01/03/24 2013   midodrine  (PROAMATINE ) tablet 10 mg  10 mg Oral TID WC Von Bellis, MD   10 mg at 01/04/24 1214   mirtazapine  (REMERON ) tablet 15 mg  15 mg Oral QHS Grubb, Rodney D, RPH       multivitamin (RENA-VIT) tablet 1 tablet  1 tablet Oral QHS Nelson, Dana G, NP   1 tablet at 01/03/24 2151   Oral care mouth rinse  15 mL Mouth Rinse PRN Isadora Hose, MD       oxyCODONE  (Oxy IR/ROXICODONE ) immediate release tablet 5 mg  5 mg Oral Q6H PRN Nelson, Dana G, NP   5 mg at 01/03/24 1744   polyethylene glycol (MIRALAX  / GLYCOLAX ) packet 17 g  17 g Oral Daily PRN  Nelson, Dana G, NP   17 g at 01/03/24 0855   PrismaSol  BGK 2/3.5 infusion   CRRT Continuous Douglas Rule, MD 400 mL/hr at 01/01/24 0647 New Bag at 01/01/24 0647   PrismaSol  BGK 2/3.5 infusion   CRRT Continuous  Douglas Rule, MD 400 mL/hr at 01/01/24 0649 New Bag at 01/01/24 9350   PrismaSol  BGK 2/3.5 infusion   CRRT Continuous Douglas Rule, MD 1,000 mL/hr at 01/01/24 0643 New Bag at 01/01/24 9356   sodium chloride  0.9 % primer fluid for CRRT   CRRT PRN Kolluru, Sarath, MD       tamsulosin  (FLOMAX ) capsule 0.4 mg  0.4 mg Oral QPC supper Von Bellis, MD   0.4 mg at 01/03/24 1744   Vitamin D  (Ergocalciferol ) (DRISDOL ) 1.25 MG (50000 UNIT) capsule 50,000 Units  50,000 Units Oral Q7 days Von Bellis, MD   50,000 Units at 01/03/24 1018     Discharge Medications: Please see discharge summary for a list of discharge medications.  Relevant Imaging Results:  Relevant Lab Results:   Additional Information SS# [id number]  Silvano Molt, LCSW

## 2024-01-04 NOTE — Progress Notes (Signed)
 [facility] Progress Note  Patient: Daniel Carr    FMW:[id number]  DOA: 12/27/2023     Date of Service: the patient was seen and examined on 01/04/2024  Chief Complaint  Patient presents with   Shortness of Breath   Altered Mental Status   Brief hospital course: 89 y.o. Male admitted with Acute Metabolic Encephalopathy, Acute Hypoxic Hypercapnic Respiratory Failure in the setting of Severe Sepsis with Septic Shock due to Enterococcus Faecalis BACTEREMIA due to UTI, transaminitis and AKI requiring intubation and mechanical ventilation, along with initiation of CRRT   This is an 89 yo male who presented to [facility] ER via EMS on 12/31 from [facility] with altered mental status and acute respiratory failure.  Pts family reports he has had worsening bilateral lower extremity edema and decreased urinary output.  His PCP increased his outpatient lasix  dose to 40 mg daily, however his urinary output did not improve.  Pts granddaughter reports he also c/o chills and fevers.  He was recently hospitalized at [facility] on 12/02/23 with a right humerus fracture from a mechanical fall on 12/3 and evaluated by ortho who recommended a sling for comfort and outpatient follow-up.  However, during presentation he was found to have progressive dyspnea; severe pulmonary hypertension; hyponatremia; acute blood loss anemia suspected secondary to right chest wall hematoma and acute kidney injury on chronic kidney disease requiring hospital admission.  Following treatment he was discharged on 12/17 to [facility] for rehab and instructed not to resume his outpatient apixaban  until 12/18.  His daughter reports the pts apixaban  has been resumed as instructed.     ED Course  Upon arrival to the ER pt in severe respiratory distress, hypotensive, and minimally responsive requiring mechanical intubation.  Significant lab results were: Na+ 125/K+ 5.9/chloride 86/BUN 72/creatinine 4.46/calcium 8.7/albumin  2.5/AST 143/ALT  89/troponin 550/lactic acid 2.4/wbc 15.9/hgb 11.2/PT 25.0/INR 2.2/APTT 48.  CXR concerning for possible small right pleural effusion.  He met sepsis criteria and received: 2L saline bolus; vancomycin , and aztreonam .  Despite iv fluid resuscitation pt remained hypotensive requiring levophed  gtt.  PCCM team contacted for ICU  admission.   Significant Hospital Events 12/31: Pt admitted with acute hypercapnic respiratory failure, septic shock, AECOPD, pneumonia suspect aspiration, acute kidney injury, acute on chronic hyponatremia, transaminitis, and acute metabolic encephalopathy mechanical intubation  12/27/22- patient with improved mentation post CRRT.  Reducing his sedatives on MV today.  Sepsis bacteremia, ID on case.  He is oozing blood from IV sites, will order DIC panel and pathology slide review.   12/29/23- patient following verbal communication this am, remains in anasarca. Bacteremia being treated. He has CRRT with now removal of fluid at 100/hr.  Starting NG feeds.  Tracheal aspirate with GPCs in pairs.  ID on case continues with ampicillin  reducing steroids and off levophed  today. 12/30/23- patient for SBT today.  Last day of steroids today. Failed SBT today.  S/p iD evaluation 12/31/23-patient liberated from MV today on BIPAP, required precedex  for aggitation. Remain on ampicillin . Met with daughter and granddaughter few times to review findings and medical plan.  01/01/24- patient improved overnight, weaned of bipap on hfnc, remains edematous but improved on CRRT with 100/h UF, met with family reviewed medical plan, ordering PT/OT for tommorow. Remains on  heparin  with CTEPH/chronic DVTs. CRRT stopped due to clotting filter. 01/02/24- Off BiPAP and Precedex , respiratory and mental status improving.  Vasopressors weaned off.  Creatinine increased, Urine output improved, Nephrology to decide on whether to resume CRRT vs trial intermittent HD  vs observation.  Resume outpatient Eliquis  at reduced dose (2.5  mg BID) for chronic bilateral LE DVT's.  Micro data: 12/31: Covid/RSV/Flu PCR>>negative 12/31: Blood cultures>> Enterococcus Faecalis 12/31: MRSA PCR>>negative 1/1: Urine>> Enterococcus Faecalis 1/1: Tracheal aspirate>> normal respiratory flora 1/1: Hepatitis panel>>negative  Transferred from ICU under [facility] service on 01/03/2024  Assessment and Plan:  # Septic Shock ~ RESOLVED # Acute on Chronic HFpEF # Elevated Troponin, suspect demand ischemia  # CTEPH PMHx: HTN, aortic stenosis, PE, HLD Echocardiogram 12/28/23: LVEF 60-65%, indeterminate diastolic parameters, moderate Aortic stenosis -Continuous cardiac monitoring -Maintain MAP >65 -Vasopressors as needed to maintain MAP goal ~ weaned off -HS Troponin peaked at 550 -Volume removal with renal replacement therapy 1/8 increase to midodrine  10 mg p.o. 3 times daily due to soft blood pressure  # Severe Sepsis  # Enterococcus Faecalis BACTEREMIA # Enterococcus Faecalis UTI # Questionable Pneumonia -Monitor fever curve -Trend WBC's & Procalcitonin -Follow cultures as above -ID is following, appreciate input ~ Continue empiric Ampicillin  pending cultures & sensitivities Continue to biotics for total 2 weeks total 01/10/2024 as per ID, transition to oral antibiotics on discharge  # Acute Hypoxic & Hypercapnic Respiratory Failure # AECOPD -EXTUBATED 1/4 -Supplemental O2 as needed to maintain O2 sats 88 to 92% -BiPAP qhs -Follow intermittent Chest X-ray & ABG as needed -Bronchodilators  -ABX as above -Diuresis as BP and renal function permits ~ Volume removal with Renal Replacement Therapy -Pulmonary toilet as able   # Acute kidney injury suspect secondary to ATN and diuretic therapy  # Hyperkalemia ~ RESOLVED # Acute on chronic hyponatremia ~ RESOLVED # Lactic acidosis  -Monitor I&O's / urinary output -Follow BMP -Ensure adequate renal perfusion -Avoid nephrotoxic agents as able -Replace electrolytes as indicated ~ Pharmacy  following for assistance with electrolyte replacement -Nephrology following, appreciate input ~ Renal Replacement Therapy as per Nephrology 1/8 started Lasix  IV infusion due to volume overload, significant lower extremity edema   # Previous Right Humerus Fracture # Chronic Bilateral Lower Extremity DVT's -Venous US  of Bilateral LE on 12/31 positive for extensive bilateral non-occlusive wall thickening/chronic DVT extending from level of the bilateral common femoral through the bilateral popliteal veins.  No acute or occlusive DVT within either lower extremity -Venous US  of RUE on 1/1 negative for DVT -Fracture was being managed conservatively outpatient by Ortho ~ continue sling 1/7 increased Eliquis  5 mg p.o. twice daily VTE dose, does not need correction due to renal functions   # Anemia of chronic disease Iron  deficiency, transferrin saturation 12% Continue oral iron  supplement -Monitor for S/Sx of bleeding -Trend CBC -Resume home Eliquis  for AC/VTE Prophylaxis  -Transfuse for Hgb <7   # Hyperglycemia # Hypothyroidism -CBG's ac & qhs; Target range of 140 to 180 -SSI  -Follow ICU Hypo/Hyperglycemia protocol -Continue Synthroid    # Acute Metabolic Encephalopathy ~ IMPROVING CT Head on 12/31 negative for acute intracranial process -Treatment of metabolic derangements as outlined above -Provide supportive care -Promote normal sleep/wake cycle and family presence -Avoid sedating medications as able   # Chronic urinary retention Patient failed voiding trial, Foley catheter was reinserted on 01/03/2024 Started Flomax  Follow voiding trial when patient is ambulatory, continue Foley catheter in the meantime  # Vitamin D  insufficiency: started vitamin D  50,000 units p.o. weekly, follow with PCP to repeat vitamin D  level after 3 to 6 months.   Body mass index is 27.61 kg/m.  Nutrition Problem: Inadequate oral intake Etiology: inability to eat (pt sedated and  ventilated) Interventions: Interventions: MVI,  Nepro shake, Prostat   Diet: Dysphagia 3 diet with fluid reduction 1.5 L/day DVT Prophylaxis: Therapeutic Anticoagulation with Eliquis     Advance goals of care discussion: Full code  Family Communication: family was not present at bedside, at the time of interview.  The pt provided permission to discuss medical plan with the family. Opportunity was given to ask question and all questions were answered satisfactorily.   Disposition:  Pt is from [facility], admitted with septic shock, still has electrolyte imbalance and respiratory failure, on IV antibiotics, which precludes a safe discharge. Discharge to SNF, when stable, may need few more days to improve.  Subjective: No significant overnight events, patient was resting comfortably, denied any worsening of shortness of breath, nausea chest pain or palpitation, no any other active issues.  Physical Exam: General: NAD, lying comfortably Appear in no distress, affect appropriate Eyes: PERRLA ENT: Oral Mucosa Clear, moist  Neck: no JVD,  Cardiovascular: S1 and S2 Present, no Murmur,  Respiratory: Equal air entry bilaterally, bibasilar crackles, no wheezing Abdomen: Bowel Sound present, Soft and no tenderness,  Skin: no rashes Extremities: 3-4+ Pedal edema, no calf tenderness Neurologic: without any new focal findings Gait not checked due to patient safety concerns  Vitals:   01/04/24 1200 01/04/24 1300 01/04/24 1334 01/04/24 1400  BP: 114/72   113/73  Pulse: 94 95 92 97  Resp: (!) 29 (!) 34 (!) 24 (!) 29  Temp:      TempSrc:      SpO2: 97% 97% 96% 97%  Weight:      Height:        Intake/Output Summary (Last 24 hours) at 01/04/2024 1500 Last data filed at 01/04/2024 1208 Gross per 24 hour  Intake 695 ml  Output 1655 ml  Net -960 ml   Filed Weights   01/02/24 0718 01/03/24 0500 01/04/24 0500  Weight: 96.4 kg 97.3 kg 94.9 kg    Data Reviewed: I have personally  reviewed and interpreted daily labs, tele strips, imagings as discussed above. I reviewed all nursing notes, pharmacy notes, vitals, pertinent old records I have discussed plan of care as described above with RN and patient/family.  CBC: Recent Labs  Lab 12/31/23 0839 01/01/24 0428 01/02/24 0400 01/03/24 0503 01/04/24 0514  WBC 16.5* 13.2* 12.8* 10.3 8.2  NEUTROABS 13.8* 10.8* 10.0*  --   --   HGB 8.8* 9.1* 8.7* 8.4* 8.5*  HCT 26.2* 28.3* 27.4* 26.0* 27.0*  MCV 92.9 97.9 100.0 100.4* 102.7*  PLT 212 207 205 215 231   Basic Metabolic Panel: Recent Labs  Lab 12/31/23 0412 12/31/23 1605 01/01/24 0428 01/01/24 1603 01/02/24 0400 01/02/24 1459 01/03/24 0503 01/04/24 0514  NA 135   < > 136 135 137 135 139 144  K 3.6   < > 3.5 4.1 4.1 4.6 4.3 4.6  CL 102   < > 102 101 104 102 106 107  CO2 24   < > 26 25 26  20* 25 26  GLUCOSE 167*   < > 93 98 111* 117* 95 104*  BUN 49*   < > 40* 41* 49* 50* 47* 48*  CREATININE 1.47*   < > 1.15 1.33* 1.75* 1.83* 1.73* 1.77*  CALCIUM 9.4   < > 9.4 9.2 8.7* 8.9 8.3* 9.0  MG 2.0  --  2.0  --  2.0  --  2.2 2.3  PHOS 2.6   < > 2.4* 5.2* 4.6 4.5 4.3 3.1   < > = values in this  interval not displayed.    Studies: No results found.  Scheduled Meds:  (feeding supplement) PROSource Plus  30 mL Oral BID BM   apixaban   5 mg Oral BID   vitamin C   500 mg Oral Daily   bisacodyl   10 mg Rectal Once   brimonidine   1 drop Both Eyes TID   Chlorhexidine  Gluconate Cloth  6 each Topical Daily   diclofenac  Sodium  2 g Topical BID   docusate sodium   100 mg Oral BID   dorzolamide   1 drop Left Eye TID   DULoxetine   30 mg Oral Daily   feeding supplement (NEPRO CARB STEADY)  237 mL Oral BID BM   insulin  aspart  0-5 Units Subcutaneous QHS   insulin  aspart  0-6 Units Subcutaneous TID WC   ipratropium-albuterol   3 mL Nebulization TID   iron  polysaccharides  150 mg Oral Daily   latanoprost   1 drop Both Eyes QHS   levothyroxine   150 mcg Oral Q0600   lidocaine   1  patch Transdermal Q24H   midodrine   10 mg Oral TID WC   mirtazapine   15 mg Oral QHS   multivitamin  1 tablet Oral QHS   tamsulosin   0.4 mg Oral QPC supper   Vitamin D  (Ergocalciferol )  50,000 Units Oral Q7 days   Continuous Infusions:  ampicillin  (OMNIPEN) IV 2 g (01/04/24 1208)   furosemide  (LASIX ) 200 mg in dextrose  5 % 100 mL (2 mg/mL) infusion 4 mg/hr (01/04/24 1154)   PrismaSol  BGK 2/3.5 400 mL/hr at 01/01/24 0647   PrismaSol  BGK 2/3.5 400 mL/hr at 01/01/24 0649   PrismaSol  BGK 2/3.5 1,000 mL/hr at 01/01/24 0643   PRN Meds: heparin , ipratropium-albuterol , mouth rinse, oxyCODONE , polyethylene glycol, sodium chloride   Time spent: 55 minutes  Author: ELVAN SOR. MD [facility] 01/04/2024 3:00 PM  To reach On-call, see care teams to locate the attending and reach out to them via [email or web address]. If 7PM-7AM, please contact night-coverage If you still have difficulty reaching the attending provider, please page the [facility] (Director on Call) for [facility] on amion for assistance.

## 2024-01-04 NOTE — Consult Note (Signed)
 PHARMACY CONSULT NOTE - ELECTROLYTES  Pharmacy Consult for Electrolyte Monitoring and Replacement   Recent Labs: Potassium (mmol/L)  Date Value  01/04/2024 4.6   Magnesium  (mg/dL)  Date Value  98/91/7974 2.3   Calcium (mg/dL)  Date Value  98/91/7974 9.0   Albumin  (g/dL)  Date Value  98/92/7974 2.2 (L)  09/28/2023 4.0   Phosphorus (mg/dL)  Date Value  98/91/7974 3.1   Sodium (mmol/L)  Date Value  01/04/2024 144  09/28/2023 132 (L)   Height: 6' 0.99 (185.4 cm) Weight: 94.9 kg (209 lb 3.5 oz) IBW/kg (Calculated) : 79.88 Estimated Creatinine Clearance: 33.9 mL/min (A) (by C-G formula based on SCr of 1.77 mg/dL (H)).  Assessment  Daniel Carr is a 89 y.o. male presenting with acute on chronic respiratory failure. PMH significant for HTN, hypothyroidism, HFpEF, COPD, aortic stenosis, Hx PE / CTEPH. Pharmacy has been consulted to monitor and replace electrolytes. Patient initiated on CRRT on 12/28/2023 now stopped   Goal of Therapy: Electrolytes within normal limits  Plan:  no electrolyte replacement warranted for today Because this consult was generated as part of an ICU order set and patient is transferring pharmacy will sign off for now Please feel free to reach out if any further assistance is needed  Thank you for allowing pharmacy to be a part of this patient's care.  Adriana JONETTA Bolster, PharmD Clinical Pharmacist 01/04/2024 7:29 AM

## 2024-01-04 NOTE — Progress Notes (Signed)
 Date of Admission:  12/27/2023      ID: Daniel Carr is a 89 y.o. male  Principal Problem:   Acute hypoxic respiratory failure (HCC)  89 y.o. with a history of COPD, left hemidiaphragm paralysis, aortic stenosis , HTN, Hypothyroidism, h/o PE , b/l TKA Recent hospitalization at [facility] 12/6-12/17 with progressive dyspnea and found to have Severe Pulmonary HTN, chronic co2 retention, CTEPH on eliquis , fracture rt humerus from mechanical fall on 12/3 ( managed non surgical), chronic hyponatremia, urinary retention and foley was placed draining of urine  , started remeron  for appetite and mood and discharged to [facility] on 12/13/23   Presented to [facility] ED on 12/27/23 from [facility] with altered mental status and decreased urine output, inspite of lasix  for severe leg edema. HE was also reported to have chills and fever as per granddaughter  Subjective: Pt is awake, responding to questions  Medications:   (feeding supplement) PROSource Plus  30 mL Oral BID BM   apixaban   5 mg Oral BID   vitamin C   500 mg Oral Daily   bisacodyl   10 mg Rectal Once   brimonidine   1 drop Both Eyes TID   Chlorhexidine  Gluconate Cloth  6 each Topical Daily   diclofenac  Sodium  2 g Topical BID   docusate sodium   100 mg Oral BID   dorzolamide   1 drop Left Eye TID   DULoxetine   30 mg Oral Daily   feeding supplement (NEPRO CARB STEADY)  237 mL Oral BID BM   insulin  aspart  0-5 Units Subcutaneous QHS   insulin  aspart  0-6 Units Subcutaneous TID WC   ipratropium-albuterol   3 mL Nebulization TID   iron  polysaccharides  150 mg Oral Daily   latanoprost   1 drop Both Eyes QHS   levothyroxine   150 mcg Oral Q0600   lidocaine   1 patch Transdermal Q24H   midodrine   10 mg Oral TID WC   mirtazapine   15 mg Oral QHS   multivitamin  1 tablet Oral QHS   tamsulosin   0.4 mg Oral QPC supper   Vitamin D  (Ergocalciferol )  50,000 Units Oral Q7 days    Objective: Vital signs in last 24 hours: Patient Vitals for the past  24 hrs:  BP Temp Temp src Pulse Resp SpO2 Weight  01/04/24 1100 -- -- -- 96 (!) 30 97 % --  01/04/24 1000 103/72 -- -- 90 (!) 34 97 % --  01/04/24 0900 -- -- -- 96 (!) 34 96 % --  01/04/24 0818 -- -- -- 90 (!) 24 96 % --  01/04/24 0800 125/73 98.3 F (36.8 C) -- 86 (!) 29 97 % --  01/04/24 0700 -- -- -- 87 (!) 29 98 % --  01/04/24 0600 125/67 -- -- 81 (!) 27 98 % --  01/04/24 0500 -- -- -- 82 (!) 27 97 % 94.9 kg  01/04/24 0400 116/68 98.3 F (36.8 C) Oral 82 (!) 29 97 % --  01/04/24 0200 111/72 -- -- 82 (!) 27 98 % --  01/04/24 0000 111/66 -- -- 89 (!) 27 94 % --  01/03/24 2200 122/67 -- -- 84 (!) 33 98 % --  01/03/24 2000 129/77 98 F (36.7 C) Oral 87 (!) 30 97 % --  01/03/24 1800 127/80 -- -- 89 (!) 42 96 % --  01/03/24 1722 -- -- -- -- -- 97 % --  01/03/24 1600 138/77 98 F (36.7 C) Oral 95 (!) 30 94 % --  01/03/24  1500 129/69 -- -- 90 (!) 30 99 % --  01/03/24 1400 120/80 -- -- 94 16 99 % --      PHYSICAL EXAM:  General: alert Lungs: b/l air entry Heart: Regular rate and rhythm, no murmur, rub or gallop. Left internal jugular cath Abdomen: Soft, non-tender,not distended. Bowel sounds normal. No masses Extremities: edema legs +++ Lymph: Cervical, supraclavicular normal. Neurologic: moves all extremities  Lab Results    Latest Ref Rng & Units 01/04/2024    5:14 AM 01/03/2024    5:03 AM 01/02/2024    4:00 AM  CBC  WBC 4.0 - 10.5 K/uL 8.2  10.3  12.8   Hemoglobin 13.0 - 17.0 g/dL 8.5  8.4  8.7   Hematocrit 39.0 - 52.0 % 27.0  26.0  27.4   Platelets 150 - 400 K/uL 231  215  205        Latest Ref Rng & Units 01/04/2024    5:14 AM 01/03/2024    5:03 AM 01/02/2024    2:59 PM  CMP  Glucose 70 - 99 mg/dL 895  95  882   BUN 8 - 23 mg/dL 48  47  50   Creatinine 0.61 - 1.24 mg/dL 8.22  8.26  8.16   Sodium 135 - 145 mmol/L 144  139  135   Potassium 3.5 - 5.1 mmol/L 4.6  4.3  4.6   Chloride 98 - 111 mmol/L 107  106  102   CO2 22 - 32 mmol/L 26  25  20    Calcium 8.9 - 10.3 mg/dL  9.0  8.3  8.9       Microbiology: [facility] 12/28/23- Enterococcus fecalis 01/02/24 repeat culture negative Studies/Results: No results found.    Assessment/Plan: Acute metabolic encephalopathy- resolved   Enterococcus fecalis bacteremia with septic shock- source is complicated UTI with indwelling foley Urine culture positive for enterococcus On ampicillin - will need for 2 weeks total- 01/10/24. Continue IV as long as in hospital- can swithc to PO amoxicillin ( 1 gram TID normal dose but need to adjust for crcl) on dicharge Repeat culture sent 2 d echo no veg Will not need TEE as risk for endocarditis is very low   Acute Hypercapnic c resp failure- was mechanically ventilated- 48 hrs since he has been extubated on 1/4 COPD exacerbation   CTEPH   AKI secondary to ATN/   Septic shock with lactic acidosis   Urinary retention- has foley   Recent rt humeral fracture- managed conservatively   Hyponatremia ? Anemia?  Discussed the management with his daughter   ID will sign off- call if needed

## 2024-01-04 NOTE — Plan of Care (Signed)
  Problem: Clinical Measurements: Goal: Diagnostic test results will improve Outcome: Progressing Goal: Respiratory complications will improve Outcome: Progressing Goal: Cardiovascular complication will be avoided Outcome: Progressing   Problem: Activity: Goal: Risk for activity intolerance will decrease Outcome: Progressing   Problem: Coping: Goal: Level of anxiety will decrease Outcome: Progressing   

## 2024-01-04 NOTE — Plan of Care (Signed)
 PMT is following.  Currently patient is resting in bed sleeping at this time.  Goals are set for full code and full scope treatment.  PMT will shadow.

## 2024-01-04 NOTE — Progress Notes (Signed)
 [facility]  PROGRESS NOTE   Subjective:   UOP [address] about the same  Family members in the room at bedside   Objective:  Vital signs: Blood pressure 125/73, pulse 96, temperature 98.3 F (36.8 C), resp. rate (!) 34, height 6' 0.99 (1.854 m), weight 94.9 kg, SpO2 96%.  Intake/Output Summary (Last 24 hours) at 01/04/2024 0922 Last data filed at 01/04/2024 0900 Gross per 24 hour  Intake 1535 ml  Output 1455 ml  Net 80 ml   Filed Weights   01/02/24 0718 01/03/24 0500 01/04/24 0500  Weight: 96.4 kg 97.3 kg 94.9 kg     Physical Exam: General:  No acute distress  Head:  Normocephalic, atraumatic. Moist oral mucosal membranes  Eyes:  Anicteric  Lungs:   Clear to auscultation, normal effort  Heart:  S1S2 no rubs  Abdomen:   Soft, nontender, bowel sounds present  Extremities:  peripheral edema present.  Neurologic: Lethargic, resting quietly  Skin:  No lesions  Access:     Basic Metabolic Panel: Recent Labs  Lab 12/31/23 0412 12/31/23 1605 01/01/24 0428 01/01/24 1603 01/02/24 0400 01/02/24 1459 01/03/24 0503 01/04/24 0514  NA 135   < > 136 135 137 135 139 144  K 3.6   < > 3.5 4.1 4.1 4.6 4.3 4.6  CL 102   < > 102 101 104 102 106 107  CO2 24   < > 26 25 26  20* 25 26  GLUCOSE 167*   < > 93 98 111* 117* 95 104*  BUN 49*   < > 40* 41* 49* 50* 47* 48*  CREATININE 1.47*   < > 1.15 1.33* 1.75* 1.83* 1.73* 1.77*  CALCIUM 9.4   < > 9.4 9.2 8.7* 8.9 8.3* 9.0  MG 2.0  --  2.0  --  2.0  --  2.2 2.3  PHOS 2.6   < > 2.4* 5.2* 4.6 4.5 4.3 3.1   < > = values in this interval not displayed.   GFR: Estimated Creatinine Clearance: 33.9 mL/min (A) (by C-G formula based on SCr of 1.77 mg/dL (H)).  Liver Function Tests: Recent Labs  Lab 01/01/24 0428 01/01/24 1603 01/02/24 0400 01/02/24 1459 01/03/24 0503  ALBUMIN  2.5* 2.4* 2.3* 2.5* 2.2*   No results for input(s): LIPASE, AMYLASE in the last 168 hours.  No results for input(s): AMMONIA in the last  168 hours.   CBC: Recent Labs  Lab 12/31/23 0839 01/01/24 0428 01/02/24 0400 01/03/24 0503 01/04/24 0514  WBC 16.5* 13.2* 12.8* 10.3 8.2  NEUTROABS 13.8* 10.8* 10.0*  --   --   HGB 8.8* 9.1* 8.7* 8.4* 8.5*  HCT 26.2* 28.3* 27.4* 26.0* 27.0*  MCV 92.9 97.9 100.0 100.4* 102.7*  PLT 212 207 205 215 231     HbA1C: Hgb A1c MFr Bld  Date/Time Value Ref Range Status  12/31/2023 08:39 AM 5.3 4.8 - 5.6 % Final    Comment:    (NOTE)         Prediabetes: 5.7 - 6.4         Diabetes: >6.4         Glycemic control for adults with diabetes: <7.0     Urinalysis: No results for input(s): COLORURINE, LABSPEC, PHURINE, GLUCOSEU, HGBUR, BILIRUBINUR, KETONESUR, PROTEINUR, UROBILINOGEN, NITRITE, LEUKOCYTESUR in the last 72 hours.  Invalid input(s): APPERANCEUR    Imaging: No results found.    Medications:    ampicillin  (OMNIPEN) IV 2 g (01/04/24 0622)   PrismaSol  BGK  2/3.5 400 mL/hr at 01/01/24 9352   PrismaSol  BGK 2/3.5 400 mL/hr at 01/01/24 9350   PrismaSol  BGK 2/3.5 1,000 mL/hr at 01/01/24 9356    (feeding supplement) PROSource Plus  30 mL Oral BID BM   apixaban   5 mg Oral BID   vitamin C   500 mg Oral Daily   bisacodyl   10 mg Rectal Once   brimonidine   1 drop Both Eyes TID   Chlorhexidine  Gluconate Cloth  6 each Topical Daily   diclofenac  Sodium  2 g Topical BID   docusate sodium   100 mg Oral BID   dorzolamide   1 drop Left Eye TID   feeding supplement (NEPRO CARB STEADY)  237 mL Oral BID BM   insulin  aspart  0-5 Units Subcutaneous QHS   insulin  aspart  0-6 Units Subcutaneous TID WC   ipratropium-albuterol   3 mL Nebulization TID   iron  polysaccharides  150 mg Oral Daily   latanoprost   1 drop Both Eyes QHS   levothyroxine   150 mcg Oral Q0600   lidocaine   1 patch Transdermal Q24H   midodrine   5 mg Oral TID WC   multivitamin  1 tablet Oral QHS   tamsulosin   0.4 mg Oral QPC supper   Vitamin D  (Ergocalciferol )  50,000 Units Oral Q7 days     Assessment/ Plan:     89 y.o. male with past medical history of hypertension, hypothyroidism, neuropathy, COPD, MGUS, history of PE on apixaban , diastolic heart failure, hyperlipidemia who was admitted on 12/27/2023 for acute hypoxic respiratory failure.   1.  Acute kidney injury/acute metabolic acidosis/hyperkalemia/hyponatremia.  Baseline creatinine 0.9 on 12/13/2023.  CRRT on hold.  Patient is off pressors. Good UOP.  Serum creatinine stabilizing in 1.7-1.8 range without dialysis. We will reevaluate daily for need of further dialysis. No acute indication for hemodialysis at present.  We will continue to monitor.   2.  Acute respiratory failure.  Patient is now on oxygen  supplementation by nasal cannula.   3.  Hypotension secondary to systemic shock from sepsis and volume depletion. Continued on midodrine .   4. Generalized edema - now on lasix  drip. Will monitor response;  Spoke to family at bedside. Labs and medications reviewed. Will continue to follow along with you.   LOS: 8 Saralee Stank, MD [facility] 1/8/20259:22 AM

## 2024-01-05 DIAGNOSIS — Z7189 Other specified counseling: Secondary | ICD-10-CM | POA: Diagnosis not present

## 2024-01-05 DIAGNOSIS — J9601 Acute respiratory failure with hypoxia: Secondary | ICD-10-CM | POA: Diagnosis not present

## 2024-01-05 LAB — CBC
HCT: 28.2 % — ABNORMAL LOW (ref 39.0–52.0)
Hemoglobin: 9.3 g/dL — ABNORMAL LOW (ref 13.0–17.0)
MCH: 33.1 pg (ref 26.0–34.0)
MCHC: 33 g/dL (ref 30.0–36.0)
MCV: 100.4 fL — ABNORMAL HIGH (ref 80.0–100.0)
Platelets: 132 10*3/uL — ABNORMAL LOW (ref 150–400)
RBC: 2.81 MIL/uL — ABNORMAL LOW (ref 4.22–5.81)
RDW: 15.7 % — ABNORMAL HIGH (ref 11.5–15.5)
WBC: 6.5 10*3/uL (ref 4.0–10.5)
nRBC: 0 % (ref 0.0–0.2)

## 2024-01-05 LAB — PHOSPHORUS: Phosphorus: 3.8 mg/dL (ref 2.5–4.6)

## 2024-01-05 LAB — BASIC METABOLIC PANEL
Anion gap: 8 (ref 5–15)
BUN: 44 mg/dL — ABNORMAL HIGH (ref 8–23)
CO2: 27 mmol/L (ref 22–32)
Calcium: 8.6 mg/dL — ABNORMAL LOW (ref 8.9–10.3)
Chloride: 100 mmol/L (ref 98–111)
Creatinine, Ser: 1.77 mg/dL — ABNORMAL HIGH (ref 0.61–1.24)
GFR, Estimated: 37 mL/min — ABNORMAL LOW (ref >60)
Glucose, Bld: 90 mg/dL (ref 70–99)
Potassium: 4.1 mmol/L (ref 3.5–5.1)
Sodium: 137 mmol/L (ref 135–145)

## 2024-01-05 LAB — GLUCOSE, CAPILLARY
Glucose-Capillary: 78 mg/dL (ref 70–99)
Glucose-Capillary: 86 mg/dL (ref 70–99)
Glucose-Capillary: 96 mg/dL (ref 70–99)

## 2024-01-05 LAB — MAGNESIUM: Magnesium: 2.2 mg/dL (ref 1.7–2.4)

## 2024-01-05 MED ORDER — CHLORHEXIDINE GLUCONATE CLOTH 2 % EX PADS
6.0000 | MEDICATED_PAD | Freq: Every day | CUTANEOUS | Status: DC
  Administered 2024-01-05 – 2024-01-11 (×6): 6 via TOPICAL

## 2024-01-05 MED ORDER — ALTEPLASE 2 MG IJ SOLR: 2.0000 mg | Freq: Once | INTRAMUSCULAR | Status: DC

## 2024-01-05 NOTE — Progress Notes (Signed)
 [facility]  PROGRESS NOTE   Subjective:   UOP [address] about the same  Family members in the room at bedside   Objective:  Vital signs: Blood pressure 118/76, pulse 95, temperature 97.9 F (36.6 C), temperature source Oral, resp. rate (!) 31, height 6' 0.99 (1.854 m), weight 95 kg, SpO2 98%.  Intake/Output Summary (Last 24 hours) at 01/05/2024 0915 Last data filed at 01/05/2024 0600 Gross per 24 hour  Intake 28.99 ml  Output 1725 ml  Net -1696.01 ml   Filed Weights   01/03/24 0500 01/04/24 0500 01/05/24 0500  Weight: 97.3 kg 94.9 kg 95 kg     Physical Exam: General:  No acute distress  Head:  Normocephalic, atraumatic. Moist oral mucosal membranes  Eyes:  Anicteric  Lungs:   Clear to auscultation, normal effort  Heart:  S1S2 no rubs  Abdomen:   Soft, nontender, bowel sounds present  Extremities:  peripheral edema present.  Neurologic: Lethargic, resting quietly  Skin:  No lesions  Access:     Basic Metabolic Panel: Recent Labs  Lab 01/01/24 0428 01/01/24 1603 01/02/24 0400 01/02/24 1459 01/03/24 0503 01/04/24 0514 01/05/24 0623  NA 136   < > 137 135 139 144 137  K 3.5   < > 4.1 4.6 4.3 4.6 4.1  CL 102   < > 104 102 106 107 100  CO2 26   < > 26 20* 25 26 27   GLUCOSE 93   < > 111* 117* 95 104* 90  BUN 40*   < > 49* 50* 47* 48* 44*  CREATININE 1.15   < > 1.75* 1.83* 1.73* 1.77* 1.77*  CALCIUM 9.4   < > 8.7* 8.9 8.3* 9.0 8.6*  MG 2.0  --  2.0  --  2.2 2.3 2.2  PHOS 2.4*   < > 4.6 4.5 4.3 3.1 3.8   < > = values in this interval not displayed.   GFR: Estimated Creatinine Clearance: 33.9 mL/min (A) (by C-G formula based on SCr of 1.77 mg/dL (H)).  Liver Function Tests: Recent Labs  Lab 01/01/24 0428 01/01/24 1603 01/02/24 0400 01/02/24 1459 01/03/24 0503  ALBUMIN  2.5* 2.4* 2.3* 2.5* 2.2*   No results for input(s): LIPASE, AMYLASE in the last 168 hours.  No results for input(s): AMMONIA in the last 168 hours.   CBC: Recent  Labs  Lab 12/31/23 0839 01/01/24 0428 01/02/24 0400 01/03/24 0503 01/04/24 0514 01/05/24 0623  WBC 16.5* 13.2* 12.8* 10.3 8.2 6.5  NEUTROABS 13.8* 10.8* 10.0*  --   --   --   HGB 8.8* 9.1* 8.7* 8.4* 8.5* 9.3*  HCT 26.2* 28.3* 27.4* 26.0* 27.0* 28.2*  MCV 92.9 97.9 100.0 100.4* 102.7* 100.4*  PLT 212 207 205 215 231 132*     HbA1C: Hgb A1c MFr Bld  Date/Time Value Ref Range Status  12/31/2023 08:39 AM 5.3 4.8 - 5.6 % Final    Comment:    (NOTE)         Prediabetes: 5.7 - 6.4         Diabetes: >6.4         Glycemic control for adults with diabetes: <7.0     Urinalysis: No results for input(s): COLORURINE, LABSPEC, PHURINE, GLUCOSEU, HGBUR, BILIRUBINUR, KETONESUR, PROTEINUR, UROBILINOGEN, NITRITE, LEUKOCYTESUR in the last 72 hours.  Invalid input(s): APPERANCEUR    Imaging: No results found.    Medications:    ampicillin  (OMNIPEN) IV 2 g (01/05/24 0602)   furosemide  (LASIX ) 200  mg in dextrose  5 % 100 mL (2 mg/mL) infusion 4 mg/hr (01/05/24 0225)   PrismaSol  BGK 2/3.5 400 mL/hr at 01/01/24 9352   PrismaSol  BGK 2/3.5 400 mL/hr at 01/01/24 9350   PrismaSol  BGK 2/3.5 1,000 mL/hr at 01/01/24 9356    (feeding supplement) PROSource Plus  30 mL Oral BID BM   alteplase   2 mg Intracatheter Once   apixaban   5 mg Oral BID   vitamin C   500 mg Oral Daily   bisacodyl   10 mg Rectal Once   brimonidine   1 drop Both Eyes TID   Chlorhexidine  Gluconate Cloth  6 each Topical Daily   diclofenac  Sodium  2 g Topical BID   docusate sodium   100 mg Oral BID   dorzolamide   1 drop Left Eye TID   DULoxetine   30 mg Oral Daily   feeding supplement (NEPRO CARB STEADY)  237 mL Oral BID BM   insulin  aspart  0-5 Units Subcutaneous QHS   insulin  aspart  0-6 Units Subcutaneous TID WC   iron  polysaccharides  150 mg Oral Daily   latanoprost   1 drop Both Eyes QHS   levothyroxine   150 mcg Oral Q0600   lidocaine   1 patch Transdermal Q24H   midodrine   10 mg Oral TID WC    mirtazapine   15 mg Oral QHS   multivitamin  1 tablet Oral QHS   tamsulosin   0.4 mg Oral QPC supper   Vitamin D  (Ergocalciferol )  50,000 Units Oral Q7 days    Assessment/ Plan:     89 y.o. male with past medical history of hypertension, hypothyroidism, neuropathy, COPD, MGUS, history of PE on apixaban , diastolic heart failure, hyperlipidemia who was admitted on 12/27/2023 for acute hypoxic respiratory failure.   1.  Acute kidney injury/ secondary to ATN  Baseline creatinine 0.9 on 12/13/2023.  CRRT on hold.  Patient is off pressors. Good UOP.  Serum creatinine stabilizing in 1.7-1.8 range without dialysis. We will reevaluate daily for need of further dialysis. No acute indication for hemodialysis at present.  We will continue to monitor.    2.  Acute respiratory failure.  Patient is now on oxygen  supplementation by nasal cannula.   3.  Hypotension secondary to systemic shock from sepsis and volume depletion. Continued on midodrine .   4. Generalized edema - now on lasix  drip. Will monitor response;  5. E Faecalis bactermia and Complicated UTI from indwelling foley. Rx with Ampicillin .  Spoke to family at bedside. Labs and medications reviewed. Will continue to follow along with you.   LOS: 9 Saralee Stank, MD [facility] 1/9/20259:15 AM

## 2024-01-05 NOTE — TOC Progression Note (Signed)
 Transition of Care [facility]) - Progression Note    Patient Details  Name: Daniel Carr MRN: [id number] Date of Birth: Mar 11, 1935  [facility] CM/SW Contact  Lauraine [name], LCSW Phone Number: 01/05/2024, 4:16 PM  Clinical Narrative:   CSW met with patient and daughter-in-law. Patient has accepted bed offer from [facility]. Daughter-in-law will update daughter. CSW left message for [facility] admissions coordinator to notify. Will need to start insurance authorization once closer to discharge.  Expected Discharge Plan and Services                                               Social Determinants of Health (SDOH) Interventions SDOH Screenings   Food Insecurity: Patient Unable To Answer (12/28/2023)  Housing: Patient Unable To Answer (12/28/2023)  Transportation Needs: Patient Unable To Answer (12/28/2023)  Utilities: Patient Unable To Answer (12/28/2023)  Alcohol  Screen: Low Risk  (09/07/2023)  Depression (PHQ2-9): Low Risk  (09/07/2023)  Recent Concern: Depression (PHQ2-9) - Medium Risk (08/12/2023)  Financial Resource Strain: Low Risk  (11/22/2023)   Received from [facility]  Physical Activity: Inactive (09/25/2023)  Social Connections: Unknown (12/28/2023)  Stress: No Stress Concern Present (09/25/2023)  Tobacco Use: Medium Risk (12/12/2023)   Received from [facility]    Readmission Risk Interventions     No data to display

## 2024-01-05 NOTE — Progress Notes (Signed)
 Daily Progress Note   Patient Name: Daniel Carr       Date: 01/05/2024 DOB: 03/08/1935  Age: 89 y.o. MRN#: [id number] Attending Physician: Von Bellis, MD Primary Care Physician: Gasper Nancyann BRAVO, MD Admit Date: 12/27/2023  Reason for Consultation/Follow-up: Establishing goals of care  Subjective: Notes and labs reviewed.  In to see patient.  He is resting in bed with his daughter at bedside.  He states he wants to continue to try to improve in status.  He is amenable to working with physical therapy and would like to go to rehab.  Upon discussing any limits or barriers to care, he states his daughter would make these decisions for him.  His daughter states right now she wants full code and full scope.  She states very clearly she does not intend on completing a DNR for him.  She advises that should he ever require ventilator support, she would be willing to make the decisions to withdraw care when she felt it was appropriate to do so.  As goals are clearly set, PMT will sign off at this time.  Please reconsult if needs arise.   Length of Stay: 9  Current Medications: Scheduled Meds:  . (feeding supplement) PROSource Plus  30 mL Oral BID BM  . alteplase   2 mg Intracatheter Once  . apixaban   5 mg Oral BID  . vitamin C   500 mg Oral Daily  . bisacodyl   10 mg Rectal Once  . brimonidine   1 drop Both Eyes TID  . Chlorhexidine  Gluconate Cloth  6 each Topical Daily  . diclofenac  Sodium  2 g Topical BID  . dorzolamide   1 drop Left Eye TID  . DULoxetine   30 mg Oral Daily  . feeding supplement (NEPRO CARB STEADY)  237 mL Oral BID BM  . iron  polysaccharides  150 mg Oral Daily  . latanoprost   1 drop Both Eyes QHS  . levothyroxine   150 mcg Oral Q0600  . lidocaine   1 patch Transdermal Q24H  .  midodrine   10 mg Oral TID WC  . mirtazapine   15 mg Oral QHS  . multivitamin  1 tablet Oral QHS  . tamsulosin   0.4 mg Oral QPC supper  . Vitamin D  (Ergocalciferol )  50,000 Units Oral Q7 days    Continuous Infusions: . ampicillin  (OMNIPEN) IV 2  g (01/05/24 0602)  . furosemide  (LASIX ) 200 mg in dextrose  5 % 100 mL (2 mg/mL) infusion 4 mg/hr (01/05/24 0225)    PRN Meds: ipratropium-albuterol , mouth rinse, oxyCODONE , polyethylene glycol, sodium chloride   Physical Exam Pulmonary:     Effort: Pulmonary effort is normal.  Neurological:     Mental Status: He is alert.             Vital Signs: BP 118/61   Pulse 98   Temp 97.9 F (36.6 C) (Oral)   Resp (!) 42   Ht 6' 0.99 (1.854 m)   Wt 95 kg   SpO2 97%   BMI 27.64 kg/m  SpO2: SpO2: 97 % O2 Device: O2 Device: Nasal Cannula O2 Flow Rate: O2 Flow Rate (L/min): 3 L/min  Intake/output summary:  Intake/Output Summary (Last 24 hours) at 01/05/2024 1208 Last data filed at 01/05/2024 0800 Gross per 24 hour  Intake 28.99 ml  Output 1725 ml  Net -1696.01 ml   LBM: Last BM Date : 01/05/24 Baseline Weight: Weight: 123.7 kg Most recent weight: Weight: 95 kg   Patient Active Problem List   Diagnosis Date Noted  . Bacteremia due to Enterococcus 01/04/2024  . AKI (acute kidney injury) (HCC) 01/04/2024  . Complicated UTI (urinary tract infection) 01/04/2024  . Acute hypoxemic respiratory failure (HCC) 12/27/2023  . Osteoarthrosis, elbow, right 09/28/2023  . 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 Pulmonary embolism (HCC) 01/14/2021  . Total knee replacement status 12/24/2020  . Systolic murmur 08/25/2020  . Allergic rhinitis 02/19/2019  . MGUS (monoclonal gammopathy of unknown significance) 11/11/2018  . Systolic dysfunction 07/29/2017  . Primary osteoarthritis of left knee 07/28/2017  . Callus of foot 07/27/2016  . Nail dystrophy 07/27/2016  . Arthropathia 06/09/2015  . Neuropathic ulnar nerve 06/09/2015  . Glaucoma   . Hypothyroid   .  Pure hypercholesterolemia   . Hypertension, benign   . Arthritis, multiple joint involvement   . BPH (benign prostatic hyperplasia)   . ED (erectile dysfunction)   . Nocturia associated with benign prostatic hyperplasia     Palliative Care Assessment & Plan     Recommendations/Plan: Full code and full scope.   Patient states his daughter would be his surrogate decision maker.  Per daughter, patient's wife has end-stage dementia. As goals are set, PMT will sign off at this time.  Please reconsult if needs arise.   Code Status:    Code Status Orders  (From admission, onward)           Start     Ordered   01/03/24 0915  Full code  (Code Status)  Continuous       Question:  By:  Answer:  Consent: discussion documented in EHR   01/03/24 0914           Code Status History     Date Active Date Inactive Code Status Order ID Comments User Context   12/27/2023 1304 12/29/2023 0526 Full Code [id number]  Maranda Lonell MATSU, NP ED   12/24/2020 2213 12/29/2020 1926 Full Code [id number]  Hooten, Lynwood SQUIBB, MD Inpatient      Thank you for allowing the Palliative Medicine Team to assist in the care of this patient.    Camelia Lewis, NP  Please contact Palliative Medicine Team phone at [phone] for questions and concerns.

## 2024-01-05 NOTE — Progress Notes (Signed)
 Physical Therapy Treatment Patient Details Name: Daniel Carr MRN: [id number] DOB: 09-24-1935 Today's Date: 01/05/2024   History of Present Illness 89 y.o. Male admitted with Acute Metabolic Encephalopathy, Acute Hypoxic Hypercapnic Respiratory Failure in the setting of Severe Sepsis with Septic Shock due to Enterococcus Faecalis BACTEREMIA due to UTI, transaminitis and AKI requiring intubation and mechanical ventilation, along with initiation of CRRT.  Pt had fall with prox R humerus fx 12/3, was hospitalized at [facility], d/c to [facility] [employer]) 12/17.    PT Comments  Pt showed great effort and increased tolerance today.  He did well with supine exercises in bed (all joints remain relatively stiff, but increased ROM tolerance with increased reps).  He still needed significant assist to go supine<>sit, but managed to maintain sitting balance and perform LE and UE reaching exercises for ~15 minutes at EOB.  Pt endorses fatigue and did have increased HR up to nearly 120, but ultimately he did better than he (or this PT) expected with activity tolerance.  Pt will benefit from ongoing PT, continue with POC.     If plan is discharge home, recommend the following: Two people to help with walking and/or transfers;Two people to help with bathing/dressing/bathroom;Assistance with cooking/housework;Assistance with feeding;Assist for transportation   Can travel by private vehicle     No  Equipment Recommendations  Rolling walker (2 wheels)    Recommendations for Other Services       Precautions / Restrictions Precautions Precautions: Fall Restrictions RUE Weight Bearing Per Provider Order: Non weight bearing     Mobility  Bed Mobility Overal bed mobility: Needs Assistance Bed Mobility: Supine to Sit, Sit to Supine     Supine to sit: Mod assist, Max assist Sit to supine: Max assist   General bed mobility comments: Pt was able to do some LE movement toward EOB and with HHA assisted with  pulling up to sitting, still needing significant assist to get back to supine    Transfers                   General transfer comment: deferred for safety    Ambulation/Gait                   Stairs             Wheelchair Mobility     Tilt Bed    Modified Rankin (Stroke Patients Only)       Balance Overall balance assessment: Needs assistance Sitting-balance support: Single extremity supported Sitting balance-Leahy Scale: Fair Sitting balance - Comments: Pt initially with lean to L and need for consistent cuing and intermittent assist.  But after a few minutes was able to maintain on his own and actually did quite well maintaining for another ~10 minutes with close supervision but ability to maintain static and light dynamic sitting w/o LOBs or need for direct assist                                    Cognition Arousal: Lethargic Behavior During Therapy: Flat affect Overall Cognitive Status: Impaired/Different from baseline                                          Exercises General Exercises - Lower Extremity Ankle Circles/Pumps: AROM, AAROM, 10 reps Quad Sets: Strengthening,  10 reps Long Arc Quad: AROM, 10 reps Heel Slides: AAROM, AROM, 10 reps (with resisted leg ext) Hip ABduction/ADduction: Strengthening, 10 reps Hip Flexion/Marching: AROM, 10 reps Other Exercises Other Exercises: seated reaching with L UE to moving target minimally out side BOS    General Comments General comments (skin integrity, edema, etc.): Pt was able to maintain sitting EOB for ~15 minutes, mostly w/o needing direct assist.  HR slowly increasing to the high 110s, reported fatigue      Pertinent Vitals/Pain Pain Assessment Pain Assessment: 0-10 Pain Score: 6  Pain Location: R shoulder    Home Living                          Prior Function            PT Goals (current goals can now be found in the care plan  section) Progress towards PT goals: Progressing toward goals    Frequency    Min 1X/week      PT Plan      Co-evaluation              AM-PAC PT 6 Clicks Mobility   Outcome Measure  Help needed turning from your back to your side while in a flat bed without using bedrails?: A Lot Help needed moving from lying on your back to sitting on the side of a flat bed without using bedrails?: A Lot Help needed moving to and from a bed to a chair (including a wheelchair)?: Total Help needed standing up from a chair using your arms (e.g., wheelchair or bedside chair)?: Total Help needed to walk in hospital room?: Total Help needed climbing 3-5 steps with a railing? : Total 6 Click Score: 8    End of Session Equipment Utilized During Treatment: Oxygen  Activity Tolerance: Patient tolerated treatment well;Patient limited by fatigue Patient left: in bed;with call bell/phone within reach;with nursing/sitter in room;with family/visitor present Nurse Communication: Mobility status PT Visit Diagnosis: Muscle weakness (generalized) (M62.81);Difficulty in walking, not elsewhere classified (R26.2);Pain;Unsteadiness on feet (R26.81) Pain - Right/Left: Right Pain - part of body: Shoulder     Time: 8297-8264 PT Time Calculation (min) (ACUTE ONLY): 33 min  Charges:    $Therapeutic Exercise: 8-22 mins $Therapeutic Activity: 8-22 mins PT General Charges $$ ACUTE PT VISIT: 1 Visit                     Carmin JONELLE Deed, DPT 01/05/2024, 6:33 PM

## 2024-01-05 NOTE — Progress Notes (Signed)
 [facility] Progress Note  Patient: Daniel Carr    FMW:[id number]  DOA: 12/27/2023     Date of Service: the patient was seen and examined on 01/05/2024  Chief Complaint  Patient presents with   Shortness of Breath   Altered Mental Status   Brief hospital course: 89 y.o. Male admitted with Acute Metabolic Encephalopathy, Acute Hypoxic Hypercapnic Respiratory Failure in the setting of Severe Sepsis with Septic Shock due to Enterococcus Faecalis BACTEREMIA due to UTI, transaminitis and AKI requiring intubation and mechanical ventilation, along with initiation of CRRT   This is an 89 yo male who presented to [facility] ER via EMS on 12/31 from [facility] with altered mental status and acute respiratory failure.  Pts family reports he has had worsening bilateral lower extremity edema and decreased urinary output.  His PCP increased his outpatient lasix  dose to 40 mg daily, however his urinary output did not improve.  Pts granddaughter reports he also c/o chills and fevers.  He was recently hospitalized at [facility] on 12/02/23 with a right humerus fracture from a mechanical fall on 12/3 and evaluated by ortho who recommended a sling for comfort and outpatient follow-up.  However, during presentation he was found to have progressive dyspnea; severe pulmonary hypertension; hyponatremia; acute blood loss anemia suspected secondary to right chest wall hematoma and acute kidney injury on chronic kidney disease requiring hospital admission.  Following treatment he was discharged on 12/17 to [facility] for rehab and instructed not to resume his outpatient apixaban  until 12/18.  His daughter reports the pts apixaban  has been resumed as instructed.     ED Course  Upon arrival to the ER pt in severe respiratory distress, hypotensive, and minimally responsive requiring mechanical intubation.  Significant lab results were: Na+ 125/K+ 5.9/chloride 86/BUN 72/creatinine 4.46/calcium 8.7/albumin  2.5/AST 143/ALT  89/troponin 550/lactic acid 2.4/wbc 15.9/hgb 11.2/PT 25.0/INR 2.2/APTT 48.  CXR concerning for possible small right pleural effusion.  He met sepsis criteria and received: 2L saline bolus; vancomycin , and aztreonam .  Despite iv fluid resuscitation pt remained hypotensive requiring levophed  gtt.  PCCM team contacted for ICU  admission.   Significant Hospital Events 12/31: Pt admitted with acute hypercapnic respiratory failure, septic shock, AECOPD, pneumonia suspect aspiration, acute kidney injury, acute on chronic hyponatremia, transaminitis, and acute metabolic encephalopathy mechanical intubation  12/27/22- patient with improved mentation post CRRT.  Reducing his sedatives on MV today.  Sepsis bacteremia, ID on case.  He is oozing blood from IV sites, will order DIC panel and pathology slide review.   12/29/23- patient following verbal communication this am, remains in anasarca. Bacteremia being treated. He has CRRT with now removal of fluid at 100/hr.  Starting NG feeds.  Tracheal aspirate with GPCs in pairs.  ID on case continues with ampicillin  reducing steroids and off levophed  today. 12/30/23- patient for SBT today.  Last day of steroids today. Failed SBT today.  S/p iD evaluation 12/31/23-patient liberated from MV today on BIPAP, required precedex  for aggitation. Remain on ampicillin . Met with daughter and granddaughter few times to review findings and medical plan.  01/01/24- patient improved overnight, weaned of bipap on hfnc, remains edematous but improved on CRRT with 100/h UF, met with family reviewed medical plan, ordering PT/OT for tommorow. Remains on [place] heparin  with CTEPH/chronic DVTs. CRRT stopped due to clotting filter. 01/02/24- Off BiPAP and Precedex , respiratory and mental status improving.  Vasopressors weaned off.  Creatinine increased, Urine output improved, Nephrology to decide on whether to resume CRRT vs trial intermittent HD  vs observation.  Resume outpatient Eliquis  at reduced dose (2.5  mg BID) for chronic bilateral LE DVT's.  Micro data: 12/31: Covid/RSV/Flu PCR>>negative 12/31: Blood cultures>> Enterococcus Faecalis 12/31: MRSA PCR>>negative 1/1: Urine>> Enterococcus Faecalis 1/1: Tracheal aspirate>> normal respiratory flora 1/1: Hepatitis panel>>negative  Transferred from ICU under [facility] service on 01/03/2024  Assessment and Plan:  # Septic Shock ~ RESOLVED # Acute on Chronic HFpEF # Elevated Troponin, suspect demand ischemia  # CTEPH PMHx: HTN, aortic stenosis, PE, HLD Echocardiogram 12/28/23: LVEF 60-65%, indeterminate diastolic parameters, moderate Aortic stenosis -Continuous cardiac monitoring -Maintain MAP >65 -Vasopressors as needed to maintain MAP goal ~ weaned off -HS Troponin peaked at 550 -Volume removal with renal replacement therapy 1/8 increase to midodrine  10 mg p.o. 3 times daily due to soft blood pressure  # Severe Sepsis  # Enterococcus Faecalis BACTEREMIA # Enterococcus Faecalis UTI # Questionable Pneumonia -Monitor fever curve -Trend WBC's & Procalcitonin -Follow cultures as above -ID is following, appreciate input ~ Continue empiric Ampicillin  pending cultures & sensitivities Continue to biotics for total 2 weeks total 01/10/2024 as per ID, transition to oral antibiotics on discharge  # Acute Hypoxic & Hypercapnic Respiratory Failure # AECOPD -EXTUBATED 1/4 -Supplemental O2 as needed to maintain O2 sats 88 to 92% -BiPAP qhs -Follow intermittent Chest X-ray & ABG as needed -Bronchodilators  -ABX as above -Diuresis as BP and renal function permits ~ Volume removal with Renal Replacement Therapy -Pulmonary toilet as able   # Acute kidney injury suspect secondary to ATN and diuretic therapy  # Hyperkalemia ~ RESOLVED # Acute on chronic hyponatremia ~ RESOLVED # Lactic acidosis, Resolved  -Monitor I&O's / urinary output -Follow BMP -Ensure adequate renal perfusion -Avoid nephrotoxic agents as able -Replace electrolytes as indicated  ~ Pharmacy following for assistance with electrolyte replacement -Nephrology following, appreciate input ~ Renal Replacement Therapy as per Nephrology 1/8 started Lasix  IV infusion due to volume overload, significant lower extremity edema   # Previous Right Humerus Fracture # Chronic Bilateral Lower Extremity DVT's -Venous US  of Bilateral LE on 12/31 positive for extensive bilateral non-occlusive wall thickening/chronic DVT extending from level of the bilateral common femoral through the bilateral popliteal veins.  No acute or occlusive DVT within either lower extremity -Venous US  of RUE on 1/1 negative for DVT -Fracture was being managed conservatively outpatient by Ortho ~ continue sling 1/7 increased Eliquis  5 mg p.o. twice daily VTE dose, does not need correction due to renal functions   # Anemia of chronic disease Iron  deficiency, transferrin saturation 12% Continue oral iron  supplement -Monitor for S/Sx of bleeding -Trend CBC -Resume home Eliquis  for AC/VTE Prophylaxis  -Transfuse for Hgb <7   # Hyperglycemia, Resolved  -CBG's ac & qhs; Target range of 140 to 180 -Follow ICU Hypo/Hyperglycemia protocol  # Hypothyroidism: Continue Synthroid    # Acute Metabolic Encephalopathy: resolved  CT Head on 12/31 negative for acute intracranial process -Treatment of metabolic derangements as outlined above -Provide supportive care -Promote normal sleep/wake cycle and family presence -Avoid sedating medications as able   # Chronic urinary retention Patient failed voiding trial, Foley catheter was reinserted on 01/03/2024 Started Flomax  Follow voiding trial when patient is ambulatory, continue Foley catheter in the meantime  # Vitamin D  insufficiency: started vitamin D  50,000 units p.o. weekly, follow with PCP to repeat vitamin D  level after 3 to 6 months.   Body mass index is 27.64 kg/m.  Nutrition Problem: Inadequate oral intake Etiology: inability to eat (pt sedated and  ventilated) Interventions:  Interventions: MVI, Nepro shake, Prostat   Diet: Dysphagia 3 diet with fluid reduction 1.5 L/day DVT Prophylaxis: Therapeutic Anticoagulation with Eliquis     Advance goals of care discussion: Full code  Family Communication: family was present at bedside, at the time of interview.  The pt provided permission to discuss medical plan with the family. Opportunity was given to ask question and all questions were answered satisfactorily.   Disposition:  Pt is from [facility], admitted with septic shock, still has electrolyte imbalance and respiratory failure, on IV antibiotics, and Lasix  gtt, which precludes a safe discharge. Discharge to SNF, when stable, may need few more days to improve.  Subjective: No significant overnight events, overall patient feels improvement, still using supplemental O2 inhalation, denies any worsening of shortness of breath, no chest pain or palpitation.  Physical Exam: General: NAD, lying comfortably Appear in no distress, affect appropriate Eyes: PERRLA ENT: Oral Mucosa Clear, moist  Neck: no JVD,  Cardiovascular: S1 and S2 Present, no Murmur,  Respiratory: Equal air entry bilaterally, bibasilar crackles, no wheezing Abdomen: Bowel Sound present, Soft and no tenderness,  Skin: no rashes Extremities: 3-4+ Pedal edema, no calf tenderness.  Edema is gradually improving Neurologic: without any new focal findings Gait not checked due to patient safety concerns  Vitals:   01/05/24 1000 01/05/24 1100 01/05/24 1200 01/05/24 1300  BP: 104/70 118/61 118/75 107/77  Pulse: 98 98 96 93  Resp: (!) 24 (!) 42 (!) 33 (!) 38  Temp:   97.7 F (36.5 C)   TempSrc:      SpO2: 96% 97% 97% 96%  Weight:      Height:        Intake/Output Summary (Last 24 hours) at 01/05/2024 1557 Last data filed at 01/05/2024 1211 Gross per 24 hour  Intake 28.99 ml  Output 2125 ml  Net -2096.01 ml   Filed Weights   01/03/24 0500 01/04/24 0500  01/05/24 0500  Weight: 97.3 kg 94.9 kg 95 kg    Data Reviewed: I have personally reviewed and interpreted daily labs, tele strips, imagings as discussed above. I reviewed all nursing notes, pharmacy notes, vitals, pertinent old records I have discussed plan of care as described above with RN and patient/family.  CBC: Recent Labs  Lab 12/31/23 0839 01/01/24 0428 01/02/24 0400 01/03/24 0503 01/04/24 0514 01/05/24 0623  WBC 16.5* 13.2* 12.8* 10.3 8.2 6.5  NEUTROABS 13.8* 10.8* 10.0*  --   --   --   HGB 8.8* 9.1* 8.7* 8.4* 8.5* 9.3*  HCT 26.2* 28.3* 27.4* 26.0* 27.0* 28.2*  MCV 92.9 97.9 100.0 100.4* 102.7* 100.4*  PLT 212 207 205 215 231 132*   Basic Metabolic Panel: Recent Labs  Lab 01/01/24 0428 01/01/24 1603 01/02/24 0400 01/02/24 1459 01/03/24 0503 01/04/24 0514 01/05/24 0623  NA 136   < > 137 135 139 144 137  K 3.5   < > 4.1 4.6 4.3 4.6 4.1  CL 102   < > 104 102 106 107 100  CO2 26   < > 26 20* 25 26 27   GLUCOSE 93   < > 111* 117* 95 104* 90  BUN 40*   < > 49* 50* 47* 48* 44*  CREATININE 1.15   < > 1.75* 1.83* 1.73* 1.77* 1.77*  CALCIUM 9.4   < > 8.7* 8.9 8.3* 9.0 8.6*  MG 2.0  --  2.0  --  2.2 2.3 2.2  PHOS 2.4*   < > 4.6 4.5 4.3 3.1 3.8   < > =  values in this interval not displayed.    Studies: No results found.  Scheduled Meds:  (feeding supplement) PROSource Plus  30 mL Oral BID BM   alteplase   2 mg Intracatheter Once   apixaban   5 mg Oral BID   vitamin C   500 mg Oral Daily   bisacodyl   10 mg Rectal Once   brimonidine   1 drop Both Eyes TID   Chlorhexidine  Gluconate Cloth  6 each Topical Daily   diclofenac  Sodium  2 g Topical BID   dorzolamide   1 drop Left Eye TID   DULoxetine   30 mg Oral Daily   feeding supplement (NEPRO CARB STEADY)  237 mL Oral BID BM   iron  polysaccharides  150 mg Oral Daily   latanoprost   1 drop Both Eyes QHS   levothyroxine   150 mcg Oral Q0600   lidocaine   1 patch Transdermal Q24H   midodrine   10 mg Oral TID WC    mirtazapine   15 mg Oral QHS   multivitamin  1 tablet Oral QHS   tamsulosin   0.4 mg Oral QPC supper   Vitamin D  (Ergocalciferol )  50,000 Units Oral Q7 days   Continuous Infusions:  ampicillin  (OMNIPEN) IV 2 g (01/05/24 1211)   furosemide  (LASIX ) 200 mg in dextrose  5 % 100 mL (2 mg/mL) infusion 4 mg/hr (01/05/24 0225)   PRN Meds: ipratropium-albuterol , mouth rinse, oxyCODONE , polyethylene glycol, sodium chloride   Time spent: 40 minutes  Author: ELVAN SOR. MD [facility] 01/05/2024 3:57 PM  To reach On-call, see care teams to locate the attending and reach out to them via [email or web address]. If 7PM-7AM, please contact night-coverage If you still have difficulty reaching the attending provider, please page the [facility] (Director on Call) for [facility] on amion for assistance.

## 2024-01-05 NOTE — Progress Notes (Signed)
 Less alert than yesterday. Not interested in eating. Dietary is out of Nepro,butter pecan flavor so patient will not drink any Nepro. Fights when he is turned off his back. Refuses ROM. States ROM hurts so family must try to convince patient to do some ROM

## 2024-01-05 NOTE — Plan of Care (Signed)
  Problem: Education: Goal: Knowledge of General Education information will improve Description: Including pain rating scale, medication(s)/side effects and non-pharmacologic comfort measures Outcome: Progressing   Problem: Health Behavior/Discharge Planning: Goal: Ability to manage health-related needs will improve Outcome: Progressing   Problem: Clinical Measurements: Goal: Ability to maintain clinical measurements within normal limits will improve Outcome: Progressing Goal: Will remain free from infection Outcome: Progressing Goal: Diagnostic test results will improve Outcome: Progressing   Problem: Skin Integrity: Goal: Risk for impaired skin integrity will decrease Outcome: Progressing   Problem: Skin Integrity: Goal: Risk for impaired skin integrity will decrease Outcome: Progressing

## 2024-01-06 DIAGNOSIS — J9601 Acute respiratory failure with hypoxia: Secondary | ICD-10-CM | POA: Diagnosis not present

## 2024-01-06 LAB — CBC
HCT: 25.5 % — ABNORMAL LOW (ref 39.0–52.0)
Hemoglobin: 7.9 g/dL — ABNORMAL LOW (ref 13.0–17.0)
MCH: 31 pg (ref 26.0–34.0)
MCHC: 31 g/dL (ref 30.0–36.0)
MCV: 100 fL (ref 80.0–100.0)
Platelets: 220 10*3/uL (ref 150–400)
RBC: 2.55 MIL/uL — ABNORMAL LOW (ref 4.22–5.81)
RDW: 15.3 % (ref 11.5–15.5)
WBC: 6 10*3/uL (ref 4.0–10.5)
nRBC: 0 % (ref 0.0–0.2)

## 2024-01-06 LAB — BASIC METABOLIC PANEL
Anion gap: 9 (ref 5–15)
BUN: 40 mg/dL — ABNORMAL HIGH (ref 8–23)
CO2: 30 mmol/L (ref 22–32)
Calcium: 8.5 mg/dL — ABNORMAL LOW (ref 8.9–10.3)
Chloride: 102 mmol/L (ref 98–111)
Creatinine, Ser: 1.81 mg/dL — ABNORMAL HIGH (ref 0.61–1.24)
GFR, Estimated: 36 mL/min — ABNORMAL LOW (ref >60)
Glucose, Bld: 97 mg/dL (ref 70–99)
Potassium: 3.8 mmol/L (ref 3.5–5.1)
Sodium: 141 mmol/L (ref 135–145)

## 2024-01-06 LAB — MAGNESIUM: Magnesium: 2 mg/dL (ref 1.7–2.4)

## 2024-01-06 LAB — PHOSPHORUS: Phosphorus: 3 mg/dL (ref 2.5–4.6)

## 2024-01-06 NOTE — Plan of Care (Signed)
  Problem: Clinical Measurements: Goal: Respiratory complications will improve Outcome: Progressing   Problem: Activity: Goal: Risk for activity intolerance will decrease Outcome: Progressing   Problem: Nutrition: Goal: Adequate nutrition will be maintained Outcome: Progressing   Problem: Coping: Goal: Level of anxiety will decrease Outcome: Progressing   Problem: Elimination: Goal: Will not experience complications related to bowel motility Outcome: Progressing   Problem: Pain Management: Goal: General experience of comfort will improve Outcome: Progressing

## 2024-01-06 NOTE — Progress Notes (Signed)
 [facility]  PROGRESS NOTE   Subjective:   U[address] about the same Family members in the room at bedside Still has edema and continues on Lasix  drip Foley in place  Objective:  Vital signs: Blood pressure 114/72, pulse 91, temperature 97.6 F (36.4 C), temperature source Oral, resp. rate 20, height 6' 0.99 (1.854 m), weight 94.8 kg, SpO2 96%.  Intake/Output Summary (Last 24 hours) at 01/06/2024 1537 Last data filed at 01/06/2024 1300 Gross per 24 hour  Intake 668.38 ml  Output 1800 ml  Net -1131.62 ml   Filed Weights   01/04/24 0500 01/05/24 0500 01/06/24 0339  Weight: 94.9 kg 95 kg 94.8 kg     Physical Exam: General:  No acute distress  Head:  Normocephalic, atraumatic. Moist oral mucosal membranes  Eyes:  Anicteric  Lungs:   Clear to auscultation, normal effort  Heart:  S1S2 no rubs  Abdomen:   Soft, nontender, bowel sounds present  Extremities:  peripheral edema present.  Neurologic: Lethargic, resting quietly  Skin:  No lesions  Access:     Basic Metabolic Panel: Recent Labs  Lab 01/02/24 0400 01/02/24 1459 01/03/24 0503 01/04/24 0514 01/05/24 0623 01/06/24 0621  NA 137 135 139 144 137 141  K 4.1 4.6 4.3 4.6 4.1 3.8  CL 104 102 106 107 100 102  CO2 26 20* 25 26 27 30   GLUCOSE 111* 117* 95 104* 90 97  BUN 49* 50* 47* 48* 44* 40*  CREATININE 1.75* 1.83* 1.73* 1.77* 1.77* 1.81*  CALCIUM 8.7* 8.9 8.3* 9.0 8.6* 8.5*  MG 2.0  --  2.2 2.3 2.2 2.0  PHOS 4.6 4.5 4.3 3.1 3.8 3.0   GFR: Estimated Creatinine Clearance: 33.1 mL/min (A) (by C-G formula based on SCr of 1.81 mg/dL (H)).  Liver Function Tests: Recent Labs  Lab 01/01/24 0428 01/01/24 1603 01/02/24 0400 01/02/24 1459 01/03/24 0503  ALBUMIN  2.5* 2.4* 2.3* 2.5* 2.2*   No results for input(s): LIPASE, AMYLASE in the last 168 hours.  No results for input(s): AMMONIA in the last 168 hours.   CBC: Recent Labs  Lab 12/31/23 0839 01/01/24 0428 01/02/24 0400  01/03/24 0503 01/04/24 0514 01/05/24 0623 01/06/24 0621  WBC 16.5* 13.2* 12.8* 10.3 8.2 6.5 6.0  NEUTROABS 13.8* 10.8* 10.0*  --   --   --   --   HGB 8.8* 9.1* 8.7* 8.4* 8.5* 9.3* 7.9*  HCT 26.2* 28.3* 27.4* 26.0* 27.0* 28.2* 25.5*  MCV 92.9 97.9 100.0 100.4* 102.7* 100.4* 100.0  PLT 212 207 205 215 231 132* 220     HbA1C: Hgb A1c MFr Bld  Date/Time Value Ref Range Status  12/31/2023 08:39 AM 5.3 4.8 - 5.6 % Final    Comment:    (NOTE)         Prediabetes: 5.7 - 6.4         Diabetes: >6.4         Glycemic control for adults with diabetes: <7.0     Urinalysis: No results for input(s): COLORURINE, LABSPEC, PHURINE, GLUCOSEU, HGBUR, BILIRUBINUR, KETONESUR, PROTEINUR, UROBILINOGEN, NITRITE, LEUKOCYTESUR in the last 72 hours.  Invalid input(s): APPERANCEUR    Imaging: No results found.    Medications:    ampicillin  (OMNIPEN) IV Stopped (01/06/24 1235)   furosemide  (LASIX ) 200 mg in dextrose  5 % 100 mL (2 mg/mL) infusion 4 mg/hr (01/06/24 1300)    (feeding supplement) PROSource Plus  30 mL Oral BID BM   alteplase   2 mg Intracatheter Once  apixaban   5 mg Oral BID   vitamin C   500 mg Oral Daily   bisacodyl   10 mg Rectal Once   brimonidine   1 drop Both Eyes TID   Chlorhexidine  Gluconate Cloth  6 each Topical Daily   diclofenac  Sodium  2 g Topical BID   dorzolamide   1 drop Left Eye TID   DULoxetine   30 mg Oral Daily   feeding supplement (NEPRO CARB STEADY)  237 mL Oral BID BM   iron  polysaccharides  150 mg Oral Daily   latanoprost   1 drop Both Eyes QHS   levothyroxine   150 mcg Oral Q0600   lidocaine   1 patch Transdermal Q24H   midodrine   10 mg Oral TID WC   mirtazapine   15 mg Oral QHS   multivitamin  1 tablet Oral QHS   tamsulosin   0.4 mg Oral QPC supper   Vitamin D  (Ergocalciferol )  50,000 Units Oral Q7 days    Assessment/ Plan:     89 y.o. male with past medical history of hypertension, hypothyroidism, neuropathy, COPD, MGUS, history  of PE on apixaban , diastolic heart failure, hyperlipidemia who was admitted on 12/27/2023 for acute hypoxic respiratory failure.   1.  Acute kidney injury/ secondary to ATN  Baseline creatinine 0.9 on 12/13/2023.  Off CRRT and pressors Good UOP.  Serum creatinine stabilizing in 1.7-1.8 range without dialysis. No further need for dialysis is anticipated. Dialysis cathter can be removed.    2.  Acute respiratory failure.  Patient is now on oxygen  supplementation by nasal cannula.   3.  Hypotension secondary to systemic shock from sepsis and volume depletion. Continued on midodrine .   4. Generalized edema - now on lasix  drip. Will monitor response;  5. E Faecalis sepais and Complicated UTI from indwelling foley. Rx with iv Ampicillin . ID team following  Spoke to family at bedside. Labs and medications reviewed. Will continue to follow along with you.   LOS: 10 Saralee Stank, MD [facility] 1/10/20253:37 PM

## 2024-01-06 NOTE — Progress Notes (Signed)
 Occupational Therapy Treatment Patient Details Name: Daniel Carr MRN: [id number] DOB: September 24, 1935 Today's Date: 01/06/2024   History of present illness 89 y.o. Male admitted with Acute Metabolic Encephalopathy, Acute Hypoxic Hypercapnic Respiratory Failure in the setting of Severe Sepsis with Septic Shock due to Enterococcus Faecalis BACTEREMIA due to UTI, transaminitis and AKI requiring intubation and mechanical ventilation, along with initiation of CRRT.  Pt had fall with prox R humerus fx 12/3, was hospitalized at [facility], d/c to [facility] [employer]) 12/17.   OT comments  Chart reviewed, pt greeted in bed, agreeable to OT tx session targeting improving functional activity in prep for ADL completion. Improvements noted in bed mobility, requiring MAX A +1, tolerated sitting on edge of bed with MIN A, progressing to CGA for approx 5 minutes. MAX-TOTAL A +2 then required for rolling, toileting after incontinent BM. Pt educated on optimal RUE positioning, wiggling fingers to decrease edema. Pt is making progress towards goals, discharge remains appropriate.OT will continue to follow       If plan is discharge home, recommend the following:  Two people to help with walking and/or transfers;Two people to help with bathing/dressing/bathroom;Direct supervision/assist for medications management;Assist for transportation;Supervision due to cognitive status;Assistance with cooking/housework;Help with stairs or ramp for entrance;Direct supervision/assist for financial management;Assistance with feeding   Equipment Recommendations  Other (comment) (defer)    Recommendations for Other Services      Precautions / Restrictions Precautions Precautions: Fall Restrictions Weight Bearing Restrictions Per Provider Order: Yes RUE Weight Bearing Per Provider Order: Non weight bearing Other Position/Activity Restrictions: no formal orders but de facto NWBing R UE       Mobility Bed Mobility Overal bed  mobility: Needs Assistance Bed Mobility: Supine to Sit, Sit to Supine, Rolling Rolling: Max assist, +2 for physical assistance, +2 for safety/equipment   Supine to sit: Max assist, HOB elevated, Used rails Sit to supine: Max assist        Transfers                         Balance Overall balance assessment: Needs assistance Sitting-balance support: Single extremity supported Sitting balance-Leahy Scale: Fair Sitting balance - Comments: MIN A, progressing to CGA, sitting approx 5 minutes Postural control: Left lateral lean                                 ADL either performed or assessed with clinical judgement   ADL Overall ADL's : Needs assistance/impaired     Grooming: Maximal assistance;Wash/dry face;Bed level       Lower Body Bathing: Total assistance;+2 for physical assistance;+2 for safety/equipment;Bed level   Upper Body Dressing : Maximal assistance;Bed level Upper Body Dressing Details (indicate cue type and reason): donn/doff gown, Adjust sling Lower Body Dressing: Total assistance       Toileting- Clothing Manipulation and Hygiene: Total assistance;+2 for physical assistance;+2 for safety/equipment;Bed level Toileting - Clothing Manipulation Details (indicate cue type and reason): incontient BM            Extremity/Trunk Assessment              Vision       Perception     Praxis      Cognition Arousal: Alert Behavior During Therapy: Flat affect Overall Cognitive Status: Impaired/Different from baseline Area of Impairment: Attention, Following commands, Safety/judgement, Awareness, Problem solving  Current Attention Level: Sustained   Following Commands: Follows one step commands with increased time Safety/Judgement: Decreased awareness of deficits Awareness: Emergent Problem Solving: Slow processing, Decreased initiation, Requires verbal cues, Requires tactile cues          Exercises       Shoulder Instructions       General Comments vss throughout; all lines/leads intact pre/post session    Pertinent Vitals/ Pain       Pain Assessment Pain Assessment: Faces Faces Pain Scale: Hurts a little bit Pain Location: R shoulder Pain Descriptors / Indicators: Discomfort Pain Intervention(s): Monitored during session, Premedicated before session  Home Living                                          Prior Functioning/Environment              Frequency  Min 1X/week        Progress Toward Goals  OT Goals(current goals can now be found in the care plan section)  Progress towards OT goals: Progressing toward goals  Acute Rehab OT Goals Time For Goal Achievement: 01/17/24  Plan      Co-evaluation    PT/OT/SLP Co-Evaluation/Treatment: Yes Reason for Co-Treatment: Complexity of the patient's impairments (multi-system involvement);To address functional/ADL transfers   OT goals addressed during session: ADL's and self-care      AM-PAC OT 6 Clicks Daily Activity     Outcome Measure   Help from another person eating meals?: A Lot Help from another person taking care of personal grooming?: A Lot Help from another person toileting, which includes using toliet, bedpan, or urinal?: Total Help from another person bathing (including washing, rinsing, drying)?: A Lot Help from another person to put on and taking off regular upper body clothing?: A Lot Help from another person to put on and taking off regular lower body clothing?: Total 6 Click Score: 10    End of Session Equipment Utilized During Treatment: Oxygen   OT Visit Diagnosis: Other abnormalities of gait and mobility (R26.89);Muscle weakness (generalized) (M62.81)   Activity Tolerance Patient limited by fatigue   Patient Left in bed;with call bell/phone within reach;with family/visitor present   Nurse Communication Mobility status        Time: 1120-1150 OT Time  Calculation (min): 30 min  Charges: OT General Charges $OT Visit: 1 Visit OT Treatments $Self Care/Home Management : 8-22 mins Therisa Sheffield, OTD OTR/L  01/06/24, 12:22 PM

## 2024-01-06 NOTE — Progress Notes (Signed)
 PROGRESS NOTE    Daniel Carr  FMW:[id number] DOB: November 13, 1935 DOA: 12/27/2023 PCP: Gasper Nancyann BRAVO, MD    Brief Narrative:  89 y.o. Male admitted with Acute Metabolic Encephalopathy, Acute Hypoxic Hypercapnic Respiratory Failure in the setting of Severe Sepsis with Septic Shock due to Enterococcus Faecalis BACTEREMIA due to UTI, transaminitis and AKI requiring intubation and mechanical ventilation, along with initiation of CRRT    This is an 89 yo male who presented to [facility] ER via EMS on 12/31 from [facility] with altered mental status and acute respiratory failure.  Pts family reports he has had worsening bilateral lower extremity edema and decreased urinary output.  His PCP increased his outpatient lasix  dose to 40 mg daily, however his urinary output did not improve.  Pts granddaughter reports he also c/o chills and fevers.  He was recently hospitalized at [facility] on 12/02/23 with a right humerus fracture from a mechanical fall on 12/3 and evaluated by ortho who recommended a sling for comfort and outpatient follow-up.  However, during presentation he was found to have progressive dyspnea; severe pulmonary hypertension; hyponatremia; acute blood loss anemia suspected secondary to right chest wall hematoma and acute kidney injury on chronic kidney disease requiring hospital admission.  Following treatment he was discharged on 12/17 to [facility] for rehab and instructed not to resume his outpatient apixaban  until 12/18.  His daughter reports the pts apixaban  has been resumed as instructed.     ED Course  Upon arrival to the ER pt in severe respiratory distress, hypotensive, and minimally responsive requiring mechanical intubation.  Significant lab results were: Na+ 125/K+ 5.9/chloride 86/BUN 72/creatinine 4.46/calcium 8.7/albumin  2.5/AST 143/ALT 89/troponin 550/lactic acid 2.4/wbc 15.9/hgb 11.2/PT 25.0/INR 2.2/APTT 48.  CXR concerning for possible small right pleural effusion.  He met sepsis  criteria and received: 2L saline bolus; vancomycin , and aztreonam .  Despite iv fluid resuscitation pt remained hypotensive requiring levophed  gtt.  PCCM team contacted for ICU  admission.   Significant Hospital Events 12/31: Pt admitted with acute hypercapnic respiratory failure, septic shock, AECOPD, pneumonia suspect aspiration, acute kidney injury, acute on chronic hyponatremia, transaminitis, and acute metabolic encephalopathy mechanical intubation  12/27/22- patient with improved mentation post CRRT.  Reducing his sedatives on MV today.  Sepsis bacteremia, ID on case.  He is oozing blood from IV sites, will order DIC panel and pathology slide review.   12/29/23- patient following verbal communication this am, remains in anasarca. Bacteremia being treated. He has CRRT with now removal of fluid at 100/hr.  Starting NG feeds.  Tracheal aspirate with GPCs in pairs.  ID on case continues with ampicillin  reducing steroids and off levophed  today. 12/30/23- patient for SBT today.  Last day of steroids today. Failed SBT today.  S/p iD evaluation 12/31/23-patient liberated from MV today on BIPAP, required precedex  for aggitation. Remain on ampicillin . Met with daughter and granddaughter few times to review findings and medical plan.  01/01/24- patient improved overnight, weaned of bipap on hfnc, remains edematous but improved on CRRT with 100/h UF, met with family reviewed medical plan, ordering PT/OT for tommorow. Remains on [place] heparin  with CTEPH/chronic DVTs. CRRT stopped due to clotting filter. 01/02/24- Off BiPAP and Precedex , respiratory and mental status improving.  Vasopressors weaned off.  Creatinine increased, Urine output improved, Nephrology to decide on whether to resume CRRT vs trial intermittent HD vs observation.  Resume outpatient Eliquis  at reduced dose (2.5 mg BID) for chronic bilateral LE DVT's.  1/7: Transferred to [facility] hospitalist service   Assessment &  Plan:   Principal Problem:   Acute  hypoxemic respiratory failure (HCC) Active Problems:   Bacteremia due to Enterococcus   AKI (acute kidney injury) (HCC)   Complicated UTI (urinary tract infection) # Septic Shock ~ RESOLVED # Acute on Chronic HFpEF # Elevated Troponin, suspect demand ischemia  # CTEPH PMHx: HTN, aortic stenosis, PE, HLD Echocardiogram 12/28/23: LVEF 60-65%, indeterminate diastolic parameters, moderate Aortic stenosis Shock physiology has resolved Plan: Continue midodrine  10 mg 3 times daily, wean as appropriate Okay for transfer to progressive unit   # Severe Sepsis  # Enterococcus Faecalis BACTEREMIA # Enterococcus Faecalis UTI # Questionable Pneumonia -Monitor fever curve -Trend WBC's & Procalcitonin -Follow cultures as above -ID is following, appreciate input ~ Continue empiric Ampicillin  pending cultures & sensitivities Continue antibiotics for 2-week total.  End date 1/14 Okay to transition to p.o. antibiotics for the patient discharged before this time   # Acute Hypoxic & Hypercapnic Respiratory Failure # AECOPD -EXTUBATED 1/4 -Supplemental O2 as needed to maintain O2 sats 88 to 92% -BiPAP qhs    # Acute kidney injury suspect secondary to ATN and diuretic therapy  # Hyperkalemia ~ RESOLVED # Acute on chronic hyponatremia ~ RESOLVED # Lactic acidosis, Resolved  Patient still with peripheral edema Diuresing well on Lasix  gtt. Nephrology following Serum creatinine stable Plan: Continue diuresis as renal function allows Per nephrology dialysis catheter can be removed     # Previous Right Humerus Fracture # Chronic Bilateral Lower Extremity DVT's -Venous US  of Bilateral LE on 12/31 positive for extensive bilateral non-occlusive wall thickening/chronic DVT extending from level of the bilateral common femoral through the bilateral popliteal veins.  No acute or occlusive DVT within either lower extremity -Venous US  of RUE on 1/1 negative for DVT -Fracture was being managed  conservatively outpatient by Ortho ~ continue sling 1/7 increased Eliquis  5 mg p.o. twice daily VTE dose, does not need correction due to renal function   # Anemia of chronic disease Iron  deficiency, transferrin saturation 12% Continue oral iron  supplement -Monitor for S/Sx of bleeding -Trend CBC -Resume home Eliquis  for AC/VTE Prophylaxis  -Transfuse for Hgb <7   # Hyperglycemia, Resolved  -CBG's ac & qhs; Target range of 140 to 180    # Hypothyroidism: Continue Synthroid    # Acute Metabolic Encephalopathy: resolved  CT Head on 12/31 negative for acute intracranial process -Treatment of metabolic derangements as outlined above -Provide supportive care -Promote normal sleep/wake cycle and family presence -Avoid sedating medications as able   # Chronic urinary retention Patient failed voiding trial, Foley catheter was reinserted on 01/03/2024 Started Flomax  Follow voiding trial when patient is ambulatory, continue Foley catheter in the meantime   # Vitamin D  insufficiency: started vitamin D  50,000 units p.o. weekly, follow with PCP to repeat vitamin D  level after 3 to 6 months.  Intractable pain Functional decline Patient's mobility efforts appear to be limited by pain.  I do lengthy discussion with patient and daughter at bedside on 1/10.  Encouraged him to take the pain medication in order to promote further mobility efforts     DVT prophylaxis: Eliquis  Code Status: Full Family Communication: Daughter at bedside 1/10 Disposition Plan: Status is: Inpatient Remains inpatient appropriate because: Multiple acute issues as above   Level of care: Progressive  Consultants:  Nephrology  Procedures:  None  Antimicrobials: None   Subjective: Seen and examined.  Somewhat flattened affect.  Patient does endorse pain in right upper extremity.  Objective: Vitals:   01/06/24 1100  01/06/24 1200 01/06/24 1300 01/06/24 1400  BP: 104/66 93/60 95/66  114/72  Pulse: 90 85 94  91  Resp: (!) 27 (!) 23 (!) 25 20  Temp:      TempSrc:      SpO2: 99% 96% 97% 96%  Weight:      Height:        Intake/Output Summary (Last 24 hours) at 01/06/2024 1757 Last data filed at 01/06/2024 1300 Gross per 24 hour  Intake 668.38 ml  Output 1650 ml  Net -981.62 ml   Filed Weights   01/04/24 0500 01/05/24 0500 01/06/24 0339  Weight: 94.9 kg 95 kg 94.8 kg    Examination:  General exam: Appears fatigued and deconditioned Respiratory system: Clear to auscultation. Respiratory effort normal. Cardiovascular system: S1 & S2 heard, RRR. No JVD, murmurs, rubs, gallops or clicks. No pedal edema. Gastrointestinal system: Soft, NT/ND, normal bowel sounds Central nervous system: Alert and oriented. No focal neurological deficits. Extremities: Markedly decreased power bilateral upper and lower extremities Skin: No rashes, lesions or ulcers Psychiatry: Judgement and insight appear normal. Mood & affect flattened.     Data Reviewed: I have personally reviewed following labs and imaging studies  CBC: Recent Labs  Lab 12/31/23 0839 01/01/24 0428 01/02/24 0400 01/03/24 0503 01/04/24 0514 01/05/24 0623 01/06/24 0621  WBC 16.5* 13.2* 12.8* 10.3 8.2 6.5 6.0  NEUTROABS 13.8* 10.8* 10.0*  --   --   --   --   HGB 8.8* 9.1* 8.7* 8.4* 8.5* 9.3* 7.9*  HCT 26.2* 28.3* 27.4* 26.0* 27.0* 28.2* 25.5*  MCV 92.9 97.9 100.0 100.4* 102.7* 100.4* 100.0  PLT 212 207 205 215 231 132* 220   Basic Metabolic Panel: Recent Labs  Lab 01/02/24 0400 01/02/24 1459 01/03/24 0503 01/04/24 0514 01/05/24 0623 01/06/24 0621  NA 137 135 139 144 137 141  K 4.1 4.6 4.3 4.6 4.1 3.8  CL 104 102 106 107 100 102  CO2 26 20* 25 26 27 30   GLUCOSE 111* 117* 95 104* 90 97  BUN 49* 50* 47* 48* 44* 40*  CREATININE 1.75* 1.83* 1.73* 1.77* 1.77* 1.81*  CALCIUM 8.7* 8.9 8.3* 9.0 8.6* 8.5*  MG 2.0  --  2.2 2.3 2.2 2.0  PHOS 4.6 4.5 4.3 3.1 3.8 3.0   GFR: Estimated Creatinine Clearance: 33.1 mL/min (A) (by  C-G formula based on SCr of 1.81 mg/dL (H)). Liver Function Tests: Recent Labs  Lab 01/01/24 0428 01/01/24 1603 01/02/24 0400 01/02/24 1459 01/03/24 0503  ALBUMIN  2.5* 2.4* 2.3* 2.5* 2.2*   No results for input(s): LIPASE, AMYLASE in the last 168 hours. No results for input(s): AMMONIA in the last 168 hours. Coagulation Profile: No results for input(s): INR, PROTIME in the last 168 hours. Cardiac Enzymes: No results for input(s): CKTOTAL, CKMB, CKMBINDEX, TROPONINI in the last 168 hours. BNP (last 3 results) No results for input(s): PROBNP in the last 8760 hours. HbA1C: No results for input(s): HGBA1C in the last 72 hours. CBG: Recent Labs  Lab 01/04/24 2020 01/04/24 2215 01/04/24 2357 01/05/24 0425 01/05/24 0749  GLUCAP 108* 100* 96 78 86   Lipid Profile: No results for input(s): CHOL, HDL, LDLCALC, TRIG, CHOLHDL, LDLDIRECT in the last 72 hours. Thyroid  Function Tests: No results for input(s): TSH, T4TOTAL, FREET4, T3FREE, THYROIDAB in the last 72 hours. Anemia Panel: No results for input(s): VITAMINB12, FOLATE, FERRITIN, TIBC, IRON , RETICCTPCT in the last 72 hours. Sepsis Labs: No results for input(s): PROCALCITON, LATICACIDVEN in the last 168 hours.  Recent Results (  from the past 240 hours)  Urine Culture     Status: Abnormal   Collection Time: 12/28/23 12:24 AM   Specimen: Urine, Random  Result Value Ref Range Status   Specimen Description   Final    URINE, RANDOM Performed at [facility], [address], [place], [place] [address]    Special Requests   Final    NONE Performed at [facility], [address] [place], [place] [address]    Culture 50,000 COLONIES/mL ENTEROCOCCUS FAECALIS (A)  Final   Report Status 12/31/2023 FINAL  Final   Organism ID, Bacteria ENTEROCOCCUS FAECALIS (A)  Final      Susceptibility   Enterococcus faecalis - MIC*    AMPICILLIN  <=2 SENSITIVE  Sensitive     NITROFURANTOIN <=16 SENSITIVE Sensitive     VANCOMYCIN  2 SENSITIVE Sensitive     * 50,000 COLONIES/mL ENTEROCOCCUS FAECALIS  Culture, Respiratory w Gram Stain     Status: None   Collection Time: 12/28/23 11:22 PM   Specimen: Tracheal Aspirate; Respiratory  Result Value Ref Range Status   Specimen Description   Final    TRACHEAL ASPIRATE Performed at [facility], [address] [place], [place] [address]    Special Requests   Final    NONE Performed at [facility], [address] [place], [place] [address]    Gram Stain   Final    FEW WBC PRESENT, PREDOMINANTLY PMN FEW GRAM POSITIVE COCCI IN PAIRS FEW GRAM VARIABLE ROD    Culture   Final    FEW Normal respiratory flora-no Staph aureus or Pseudomonas seen Performed at [facility], [address] [place], [place] [address]    Report Status 01/01/2024 FINAL  Final  Culture, blood (Routine X 2) w Reflex to ID Panel     Status: None (Preliminary result)   Collection Time: 01/02/24  2:58 PM   Specimen: BLOOD  Result Value Ref Range Status   Specimen Description BLOOD BLOOD LEFT HAND  Final   Special Requests   Final    BOTTLES DRAWN AEROBIC AND ANAEROBIC Blood Culture results may not be optimal due to an inadequate volume of blood received in culture bottles   Culture   Final    NO GROWTH 4 DAYS Performed at [facility], [address], [place], [place] [address]    Report Status PENDING  Incomplete  Culture, blood (Routine X 2) w Reflex to ID Panel     Status: None (Preliminary result)   Collection Time: 01/02/24  3:06 PM   Specimen: BLOOD  Result Value Ref Range Status   Specimen Description BLOOD BLOOD LEFT WRIST  Final   Special Requests   Final    BOTTLES DRAWN AEROBIC AND ANAEROBIC Blood Culture adequate volume   Culture   Final    NO GROWTH 4 DAYS Performed at [facility], [address] [place], [place] [address]    Report Status PENDING  Incomplete          Radiology Studies: No results found.      Scheduled Meds:  (feeding supplement) PROSource Plus  30 mL Oral BID BM   alteplase   2 mg Intracatheter Once   apixaban   5 mg Oral BID   vitamin C   500 mg Oral Daily   bisacodyl   10 mg Rectal Once   brimonidine   1 drop Both Eyes TID   Chlorhexidine  Gluconate Cloth  6 each Topical Daily   diclofenac  Sodium  2 g Topical BID   dorzolamide   1 drop Left Eye TID   DULoxetine   30 mg Oral Daily   feeding supplement (NEPRO CARB STEADY)  237 mL Oral BID BM   iron  polysaccharides  150 mg Oral Daily   latanoprost   1 drop Both Eyes QHS   levothyroxine   150 mcg Oral Q0600   lidocaine   1 patch Transdermal Q24H   midodrine   10 mg Oral TID WC   mirtazapine   15 mg Oral QHS   multivitamin  1 tablet Oral QHS   tamsulosin   0.4 mg Oral QPC supper   Vitamin D  (Ergocalciferol )  50,000 Units Oral Q7 days   Continuous Infusions:  ampicillin  (OMNIPEN) IV 2 g (01/06/24 1721)   furosemide  (LASIX ) 200 mg in dextrose  5 % 100 mL (2 mg/mL) infusion 4 mg/hr (01/06/24 1300)     LOS: 10 days    Calvin KATHEE Robson, MD [facility]   If 7PM-7AM, please contact night-coverage  01/06/2024, 5:57 PM

## 2024-01-06 NOTE — Progress Notes (Signed)
 Physical Therapy Treatment Patient Details Name: Daniel Carr MRN: [id number] DOB: 11-18-1935 Today's Date: 01/06/2024   History of Present Illness 89 y.o. Male admitted with Acute Metabolic Encephalopathy, Acute Hypoxic Hypercapnic Respiratory Failure in the setting of Severe Sepsis with Septic Shock due to Enterococcus Faecalis BACTEREMIA due to UTI, transaminitis and AKI requiring intubation and mechanical ventilation, along with initiation of CRRT.  Pt had fall with prox R humerus fx 12/3, was hospitalized at [facility], d/c to [facility] [employer]) 12/17.    PT Comments  Patient supine in bed on arrival with OT present. Incontinent of BM during OT session and required max-totalA+2 for rolling for pericare. Increased difficulty rolling onto R side 2/2 shoulder pain. Demonstrates increased B knee stiffness. Discharge plan remains appropriate.     If plan is discharge home, recommend the following: Two people to help with walking and/or transfers;Two people to help with bathing/dressing/bathroom;Assistance with cooking/housework;Assistance with feeding;Assist for transportation   Can travel by private vehicle     No  Equipment Recommendations  Other (comment) (TBD)    Recommendations for Other Services       Precautions / Restrictions Precautions Precautions: Fall Restrictions Weight Bearing Restrictions Per Provider Order: Yes RUE Weight Bearing Per Provider Order: Non weight bearing Other Position/Activity Restrictions: no formal orders but de facto NWBing R UE     Mobility  Bed Mobility Overal bed mobility: Needs Assistance Bed Mobility: Rolling Rolling: Max assist, +2 for physical assistance, +2 for safety/equipment         General bed mobility comments: increased difficulty rolling onto R side 2/2 shoulder pain. Rolling for pericare after large bowel movement    Transfers                        Ambulation/Gait                   Stairs              Wheelchair Mobility     Tilt Bed    Modified Rankin (Stroke Patients Only)       Balance                                            Cognition Arousal: Alert Behavior During Therapy: Flat affect Overall Cognitive Status: Impaired/Different from baseline Area of Impairment: Attention, Following commands, Safety/judgement, Awareness, Problem solving                   Current Attention Level: Sustained   Following Commands: Follows one step commands with increased time Safety/Judgement: Decreased awareness of deficits Awareness: Emergent Problem Solving: Slow processing, Decreased initiation, Requires verbal cues, Requires tactile cues          Exercises      General Comments General comments (skin integrity, edema, etc.): VSS throughout session      Pertinent Vitals/Pain Pain Assessment Pain Assessment: Faces Faces Pain Scale: Hurts a little bit Pain Location: R shoulder Pain Descriptors / Indicators: Discomfort Pain Intervention(s): Monitored during session, Premedicated before session    Home Living                          Prior Function            PT Goals (current goals can now be found in the care  plan section) Acute Rehab PT Goals Patient Stated Goal: get strong enough to walk PT Goal Formulation: With patient Time For Goal Achievement: 01/16/24 Potential to Achieve Goals: Good Progress towards PT goals: Progressing toward goals    Frequency    Min 1X/week      PT Plan      Co-evaluation PT/OT/SLP Co-Evaluation/Treatment: Yes Reason for Co-Treatment: Complexity of the patient's impairments (multi-system involvement);To address functional/ADL transfers PT goals addressed during session: Mobility/safety with mobility OT goals addressed during session: ADL's and self-care      AM-PAC PT 6 Clicks Mobility   Outcome Measure  Help needed turning from your back to your side while in a flat bed  without using bedrails?: A Lot Help needed moving from lying on your back to sitting on the side of a flat bed without using bedrails?: A Lot Help needed moving to and from a bed to a chair (including a wheelchair)?: Total Help needed standing up from a chair using your arms (e.g., wheelchair or bedside chair)?: Total Help needed to walk in hospital room?: Total Help needed climbing 3-5 steps with a railing? : Total 6 Click Score: 8    End of Session Equipment Utilized During Treatment: Oxygen  Activity Tolerance: Patient tolerated treatment well Patient left: in bed;with call bell/phone within reach;with family/visitor present Nurse Communication: Mobility status PT Visit Diagnosis: Muscle weakness (generalized) (M62.81);Difficulty in walking, not elsewhere classified (R26.2);Pain;Unsteadiness on feet (R26.81) Pain - Right/Left: Right Pain - part of body: Shoulder     Time: 1133-1150 PT Time Calculation (min) (ACUTE ONLY): 17 min  Charges:    $Therapeutic Activity: 8-22 mins PT General Charges $$ ACUTE PT VISIT: 1 Visit                     Maryanne Finder, PT, DPT Physical Therapist - [facility]  [facility]    Farrell Pantaleo A Lizzett Nobile 01/06/2024, 12:26 PM

## 2024-01-07 DIAGNOSIS — J9601 Acute respiratory failure with hypoxia: Secondary | ICD-10-CM | POA: Diagnosis not present

## 2024-01-07 LAB — BASIC METABOLIC PANEL
Anion gap: 8 (ref 5–15)
BUN: 37 mg/dL — ABNORMAL HIGH (ref 8–23)
CO2: 33 mmol/L — ABNORMAL HIGH (ref 22–32)
Calcium: 8.5 mg/dL — ABNORMAL LOW (ref 8.9–10.3)
Chloride: 100 mmol/L (ref 98–111)
Creatinine, Ser: 1.87 mg/dL — ABNORMAL HIGH (ref 0.61–1.24)
GFR, Estimated: 35 mL/min — ABNORMAL LOW (ref >60)
Glucose, Bld: 105 mg/dL — ABNORMAL HIGH (ref 70–99)
Potassium: 3.6 mmol/L (ref 3.5–5.1)
Sodium: 141 mmol/L (ref 135–145)

## 2024-01-07 LAB — CBC
HCT: 25 % — ABNORMAL LOW (ref 39.0–52.0)
Hemoglobin: 8.2 g/dL — ABNORMAL LOW (ref 13.0–17.0)
MCH: 33.6 pg (ref 26.0–34.0)
MCHC: 32.8 g/dL (ref 30.0–36.0)
MCV: 102.5 fL — ABNORMAL HIGH (ref 80.0–100.0)
Platelets: 241 10*3/uL (ref 150–400)
RBC: 2.44 MIL/uL — ABNORMAL LOW (ref 4.22–5.81)
RDW: 15.3 % (ref 11.5–15.5)
WBC: 6.5 10*3/uL (ref 4.0–10.5)
nRBC: 0 % (ref 0.0–0.2)

## 2024-01-07 LAB — CULTURE, BLOOD (ROUTINE X 2)
Culture: NO GROWTH
Culture: NO GROWTH
Special Requests: ADEQUATE

## 2024-01-07 MED ORDER — DOXAZOSIN MESYLATE 1 MG PO TABS
1.0000 mg | ORAL_TABLET | Freq: Every day | ORAL | Status: DC
  Administered 2024-01-08 – 2024-01-09 (×2): 1 mg via ORAL
  Filled 2024-01-07 (×6): qty 1

## 2024-01-07 NOTE — Progress Notes (Signed)
 Pt not given scheduled doxazosin due to BP 91/57 (68). MD aware.

## 2024-01-07 NOTE — Progress Notes (Signed)
 PROGRESS NOTE    Daniel Carr  FMW:[id number] DOB: 1935/07/30 DOA: 12/27/2023 PCP: Gasper Nancyann BRAVO, MD    Brief Narrative:  89 y.o. Male admitted with Acute Metabolic Encephalopathy, Acute Hypoxic Hypercapnic Respiratory Failure in the setting of Severe Sepsis with Septic Shock due to Enterococcus Faecalis BACTEREMIA due to UTI, transaminitis and AKI requiring intubation and mechanical ventilation, along with initiation of CRRT    This is an 89 yo male who presented to [facility] ER via EMS on 12/31 from [facility] with altered mental status and acute respiratory failure.  Pts family reports he has had worsening bilateral lower extremity edema and decreased urinary output.  His PCP increased his outpatient lasix  dose to 40 mg daily, however his urinary output did not improve.  Pts granddaughter reports he also c/o chills and fevers.  He was recently hospitalized at [facility] on 12/02/23 with a right humerus fracture from a mechanical fall on 12/3 and evaluated by ortho who recommended a sling for comfort and outpatient follow-up.  However, during presentation he was found to have progressive dyspnea; severe pulmonary hypertension; hyponatremia; acute blood loss anemia suspected secondary to right chest wall hematoma and acute kidney injury on chronic kidney disease requiring hospital admission.  Following treatment he was discharged on 12/17 to [facility] for rehab and instructed not to resume his outpatient apixaban  until 12/18.  His daughter reports the pts apixaban  has been resumed as instructed.     ED Course  Upon arrival to the ER pt in severe respiratory distress, hypotensive, and minimally responsive requiring mechanical intubation.  Significant lab results were: Na+ 125/K+ 5.9/chloride 86/BUN 72/creatinine 4.46/calcium 8.7/albumin  2.5/AST 143/ALT 89/troponin 550/lactic acid 2.4/wbc 15.9/hgb 11.2/PT 25.0/INR 2.2/APTT 48.  CXR concerning for possible small right pleural effusion.  He met sepsis  criteria and received: 2L saline bolus; vancomycin , and aztreonam .  Despite iv fluid resuscitation pt remained hypotensive requiring levophed  gtt.  PCCM team contacted for ICU  admission.   Significant Hospital Events 12/31: Pt admitted with acute hypercapnic respiratory failure, septic shock, AECOPD, pneumonia suspect aspiration, acute kidney injury, acute on chronic hyponatremia, transaminitis, and acute metabolic encephalopathy mechanical intubation  12/27/22- patient with improved mentation post CRRT.  Reducing his sedatives on MV today.  Sepsis bacteremia, ID on case.  He is oozing blood from IV sites, will order DIC panel and pathology slide review.   12/29/23- patient following verbal communication this am, remains in anasarca. Bacteremia being treated. He has CRRT with now removal of fluid at 100/hr.  Starting NG feeds.  Tracheal aspirate with GPCs in pairs.  ID on case continues with ampicillin  reducing steroids and off levophed  today. 12/30/23- patient for SBT today.  Last day of steroids today. Failed SBT today.  S/p iD evaluation 12/31/23-patient liberated from MV today on BIPAP, required precedex  for aggitation. Remain on ampicillin . Met with daughter and granddaughter few times to review findings and medical plan.  01/01/24- patient improved overnight, weaned of bipap on hfnc, remains edematous but improved on CRRT with 100/h UF, met with family reviewed medical plan, ordering PT/OT for tommorow. Remains on [place] heparin  with CTEPH/chronic DVTs. CRRT stopped due to clotting filter. 01/02/24- Off BiPAP and Precedex , respiratory and mental status improving.  Vasopressors weaned off.  Creatinine increased, Urine output improved, Nephrology to decide on whether to resume CRRT vs trial intermittent HD vs observation.  Resume outpatient Eliquis  at reduced dose (2.5 mg BID) for chronic bilateral LE DVT's.  1/7: Transferred to [facility] hospitalist service   Assessment &  Plan:   Principal Problem:   Acute  hypoxemic respiratory failure (HCC) Active Problems:   Bacteremia due to Enterococcus   AKI (acute kidney injury) (HCC)   Complicated UTI (urinary tract infection) # Septic Shock ~ RESOLVED # Acute on Chronic HFpEF # Elevated Troponin, suspect demand ischemia  # CTEPH PMHx: HTN, aortic stenosis, PE, HLD Echocardiogram 12/28/23: LVEF 60-65%, indeterminate diastolic parameters, moderate Aortic stenosis Shock physiology has resolved Plan: Continue midodrine  10 mg 3 times daily, wean as appropriate MAP goal 65 Okay for transfer to progressive unit   # Severe Sepsis  # Enterococcus Faecalis BACTEREMIA # Enterococcus Faecalis UTI # Questionable Pneumonia -Monitor fever curve -Trend WBC's & Procalcitonin -Follow cultures as above -ID is following, appreciate input ~ Continue empiric Ampicillin  pending cultures & sensitivities Continue antibiotics for 2-week total.  End date 1/14   # Acute Hypoxic & Hypercapnic Respiratory Failure # AECOPD -EXTUBATED 1/4 -Supplemental O2 as needed to maintain O2 sats 88 to 92% -BiPAP qhs    # Acute kidney injury suspect secondary to ATN and diuretic therapy  # Hyperkalemia ~ RESOLVED # Acute on chronic hyponatremia ~ RESOLVED # Lactic acidosis, Resolved  Patient still with peripheral edema Diuresing well on Lasix  gtt. Nephrology following Serum creatinine stable Plan: Continue diuresis as renal function allows Continue Foley catheter for accurate urine output measurement Per nephrology dialysis catheter can be removed     # Previous Right Humerus Fracture # Chronic Bilateral Lower Extremity DVT's Continue Eliquis  5 mg p.o. twice daily   # Anemia of chronic disease Iron  deficiency, transferrin saturation 12% Continue oral iron  supplement -Monitor for S/Sx of bleeding -Trend CBC -Resume home Eliquis  for AC/VTE Prophylaxis  -Transfuse for Hgb <7   # Hyperglycemia, Resolved  -CBG's ac & qhs; Target range of 140 to 180    #  Hypothyroidism: Continue Synthroid    # Acute Metabolic Encephalopathy: resolved  CT Head on 12/31 negative for acute intracranial process -Treatment of metabolic derangements as outlined above -Provide supportive care -Promote normal sleep/wake cycle and family presence -Avoid sedating medications as able   # Chronic urinary retention Patient failed voiding trial, Foley catheter was reinserted on 01/03/2024 Started Flomax  Follow voiding trial when patient is ambulatory, continue Foley catheter in the meantime   # Vitamin D  insufficiency: started vitamin D  50,000 units p.o. weekly, follow with PCP to repeat vitamin D  level after 3 to 6 months.  Intractable pain Functional decline Patient's mobility efforts appear to be limited by pain.  I do lengthy discussion with patient and daughter at bedside on 1/10.  Encouraged him to take the pain medication in order to promote further mobility efforts     DVT prophylaxis: Eliquis  Code Status: Full Family Communication: Daughter at bedside 1/10 Disposition Plan: Status is: Inpatient Remains inpatient appropriate because: Multiple acute issues as above   Level of care: Progressive  Consultants:  Nephrology  Procedures:  None  Antimicrobials: None   Subjective: Seen and examined.  Appears fatigued and weak  Objective: Vitals:   01/07/24 1100 01/07/24 1200 01/07/24 1300 01/07/24 1400  BP: 120/68 101/67 104/67 97/61  Pulse: 90 92 89 89  Resp: (!) 28 (!) 26 (!) 31 (!) 26  Temp:   (!) 97.5 F (36.4 C)   TempSrc:   Axillary   SpO2: 92% 92% 94% 95%  Weight:      Height:        Intake/Output Summary (Last 24 hours) at 01/07/2024 1501 Last data filed at 01/07/2024 1408  Gross per 24 hour  Intake 816.59 ml  Output 3975 ml  Net -3158.41 ml   Filed Weights   01/05/24 0500 01/06/24 0339 01/07/24 0418  Weight: 95 kg 94.8 kg 92.4 kg    Examination:  General exam: Fatigued, weak, deconditioned Respiratory system: Poor  respiratory effort.  Normal work of breathing.  2 L Cardiovascular system: S1-S2, RRR, no murmurs, 2+ pitting edema BLE Gastrointestinal system: Soft, NT/ND, normal bowel sounds Central nervous system: Alert and oriented. No focal neurological deficits. Extremities: Markedly decreased power bilateral upper and lower extremities Skin: No rashes, lesions or ulcers Psychiatry: Judgement and insight appear normal. Mood & affect flattened.     Data Reviewed: I have personally reviewed following labs and imaging studies  CBC: Recent Labs  Lab 01/01/24 0428 01/02/24 0400 01/03/24 0503 01/04/24 0514 01/05/24 0623 01/06/24 0621 01/07/24 0419  WBC 13.2* 12.8* 10.3 8.2 6.5 6.0 6.5  NEUTROABS 10.8* 10.0*  --   --   --   --   --   HGB 9.1* 8.7* 8.4* 8.5* 9.3* 7.9* 8.2*  HCT 28.3* 27.4* 26.0* 27.0* 28.2* 25.5* 25.0*  MCV 97.9 100.0 100.4* 102.7* 100.4* 100.0 102.5*  PLT 207 205 215 231 132* 220 241   Basic Metabolic Panel: Recent Labs  Lab 01/02/24 0400 01/02/24 1459 01/03/24 0503 01/04/24 0514 01/05/24 0623 01/06/24 0621 01/07/24 0419  NA 137 135 139 144 137 141 141  K 4.1 4.6 4.3 4.6 4.1 3.8 3.6  CL 104 102 106 107 100 102 100  CO2 26 20* 25 26 27 30  33*  GLUCOSE 111* 117* 95 104* 90 97 105*  BUN 49* 50* 47* 48* 44* 40* 37*  CREATININE 1.75* 1.83* 1.73* 1.77* 1.77* 1.81* 1.87*  CALCIUM 8.7* 8.9 8.3* 9.0 8.6* 8.5* 8.5*  MG 2.0  --  2.2 2.3 2.2 2.0  --   PHOS 4.6 4.5 4.3 3.1 3.8 3.0  --    GFR: Estimated Creatinine Clearance: 32 mL/min (A) (by C-G formula based on SCr of 1.87 mg/dL (H)). Liver Function Tests: Recent Labs  Lab 01/01/24 0428 01/01/24 1603 01/02/24 0400 01/02/24 1459 01/03/24 0503  ALBUMIN  2.5* 2.4* 2.3* 2.5* 2.2*   No results for input(s): LIPASE, AMYLASE in the last 168 hours. No results for input(s): AMMONIA in the last 168 hours. Coagulation Profile: No results for input(s): INR, PROTIME in the last 168 hours. Cardiac Enzymes: No  results for input(s): CKTOTAL, CKMB, CKMBINDEX, TROPONINI in the last 168 hours. BNP (last 3 results) No results for input(s): PROBNP in the last 8760 hours. HbA1C: No results for input(s): HGBA1C in the last 72 hours. CBG: Recent Labs  Lab 01/04/24 2020 01/04/24 2215 01/04/24 2357 01/05/24 0425 01/05/24 0749  GLUCAP 108* 100* 96 78 86   Lipid Profile: No results for input(s): CHOL, HDL, LDLCALC, TRIG, CHOLHDL, LDLDIRECT in the last 72 hours. Thyroid  Function Tests: No results for input(s): TSH, T4TOTAL, FREET4, T3FREE, THYROIDAB in the last 72 hours. Anemia Panel: No results for input(s): VITAMINB12, FOLATE, FERRITIN, TIBC, IRON , RETICCTPCT in the last 72 hours. Sepsis Labs: No results for input(s): PROCALCITON, LATICACIDVEN in the last 168 hours.  Recent Results (from the past 240 hours)  Culture, Respiratory w Gram Stain     Status: None   Collection Time: 12/28/23 11:22 PM   Specimen: Tracheal Aspirate; Respiratory  Result Value Ref Range Status   Specimen Description   Final    TRACHEAL ASPIRATE Performed at [facility], [address], [place], [place]  [address]    Special Requests   Final    NONE Performed at [facility], [address] [place], [place] [address]    Gram Stain   Final    FEW WBC PRESENT, PREDOMINANTLY PMN FEW GRAM POSITIVE COCCI IN PAIRS FEW GRAM VARIABLE ROD    Culture   Final    FEW Normal respiratory flora-no Staph aureus or Pseudomonas seen Performed at [facility], [address], [place], [place] [address]    Report Status 01/01/2024 FINAL  Final  Culture, blood (Routine X 2) w Reflex to ID Panel     Status: None   Collection Time: 01/02/24  2:58 PM   Specimen: BLOOD  Result Value Ref Range Status   Specimen Description BLOOD BLOOD LEFT HAND  Final   Special Requests   Final    BOTTLES DRAWN AEROBIC AND ANAEROBIC Blood Culture results may not be optimal  due to an inadequate volume of blood received in culture bottles   Culture   Final    NO GROWTH 5 DAYS Performed at [facility], [address] [place], [place] [address]    Report Status 01/07/2024 FINAL  Final  Culture, blood (Routine X 2) w Reflex to ID Panel     Status: None   Collection Time: 01/02/24  3:06 PM   Specimen: BLOOD  Result Value Ref Range Status   Specimen Description BLOOD BLOOD LEFT WRIST  Final   Special Requests   Final    BOTTLES DRAWN AEROBIC AND ANAEROBIC Blood Culture adequate volume   Culture   Final    NO GROWTH 5 DAYS Performed at [facility], [address], [place], [place] [address]    Report Status 01/07/2024 FINAL  Final         Radiology Studies: No results found.      Scheduled Meds:  (feeding supplement) PROSource Plus  30 mL Oral BID BM   alteplase   2 mg Intracatheter Once   apixaban   5 mg Oral BID   vitamin C   500 mg Oral Daily   brimonidine   1 drop Both Eyes TID   Chlorhexidine  Gluconate Cloth  6 each Topical Daily   diclofenac  Sodium  2 g Topical BID   dorzolamide   1 drop Left Eye TID   DULoxetine   30 mg Oral Daily   feeding supplement (NEPRO CARB STEADY)  237 mL Oral BID BM   iron  polysaccharides  150 mg Oral Daily   latanoprost   1 drop Both Eyes QHS   levothyroxine   150 mcg Oral Q0600   lidocaine   1 patch Transdermal Q24H   midodrine   10 mg Oral TID WC   mirtazapine   15 mg Oral QHS   multivitamin  1 tablet Oral QHS   tamsulosin   0.4 mg Oral QPC supper   Vitamin D  (Ergocalciferol )  50,000 Units Oral Q7 days   Continuous Infusions:  ampicillin  (OMNIPEN) IV Stopped (01/07/24 1332)   furosemide  (LASIX ) 200 mg in dextrose  5 % 100 mL (2 mg/mL) infusion 4 mg/hr (01/07/24 1408)     LOS: 11 days    Calvin KATHEE Robson, MD [facility]   If 7PM-7AM, please contact night-coverage  01/07/2024, 3:01 PM

## 2024-01-07 NOTE — Progress Notes (Signed)
 [facility]  PROGRESS NOTE   Subjective:   01/10 0701 - 01/11 0700 In: 424 [I.V.:24; IV Piggyback:400] Out: 2600 [Urine:2600] Lab Results  Component Value Date   CREATININE 1.87 (H) 01/07/2024   CREATININE 1.81 (H) 01/06/2024   CREATININE 1.77 (H) 01/05/2024  Patient continues to have good urine output. Creatinine currently 1.87. Temperature dialysis catheter removed.   Objective:  Vital signs: Blood pressure 102/65, pulse 93, temperature 98.2 F (36.8 C), temperature source Oral, resp. rate (!) 28, height 6' 0.99 (1.854 m), weight 92.4 kg, SpO2 96%.  Intake/Output Summary (Last 24 hours) at 01/07/2024 1610 Last data filed at 01/07/2024 1408 Gross per 24 hour  Intake 810.59 ml  Output 3975 ml  Net -3164.41 ml   Filed Weights   01/05/24 0500 01/06/24 0339 01/07/24 0418  Weight: 95 kg 94.8 kg 92.4 kg     Physical Exam: General:  No acute distress  Head:  Normocephalic, atraumatic. Moist oral mucosal membranes  Eyes:  Anicteric  Lungs:   Clear to auscultation, normal effort  Heart:  S1S2 no rubs  Abdomen:   Soft, nontender, bowel sounds present  Extremities:  peripheral edema present.  Neurologic:  Lethargic, resting quietly  Skin:  No lesions  Access:     Basic Metabolic Panel: Recent Labs  Lab 01/02/24 0400 01/02/24 1459 01/03/24 0503 01/04/24 0514 01/05/24 0623 01/06/24 0621 01/07/24 0419  NA 137 135 139 144 137 141 141  K 4.1 4.6 4.3 4.6 4.1 3.8 3.6  CL 104 102 106 107 100 102 100  CO2 26 20* 25 26 27 30  33*  GLUCOSE 111* 117* 95 104* 90 97 105*  BUN 49* 50* 47* 48* 44* 40* 37*  CREATININE 1.75* 1.83* 1.73* 1.77* 1.77* 1.81* 1.87*  CALCIUM 8.7* 8.9 8.3* 9.0 8.6* 8.5* 8.5*  MG 2.0  --  2.2 2.3 2.2 2.0  --   PHOS 4.6 4.5 4.3 3.1 3.8 3.0  --    GFR: Estimated Creatinine Clearance: 32 mL/min (A) (by C-G formula based on SCr of 1.87 mg/dL (H)).  Liver Function Tests: Recent Labs  Lab 01/01/24 0428 01/01/24 1603 01/02/24 0400  01/02/24 1459 01/03/24 0503  ALBUMIN  2.5* 2.4* 2.3* 2.5* 2.2*   No results for input(s): LIPASE, AMYLASE in the last 168 hours.  No results for input(s): AMMONIA in the last 168 hours.   CBC: Recent Labs  Lab 01/01/24 0428 01/02/24 0400 01/03/24 0503 01/04/24 0514 01/05/24 0623 01/06/24 0621 01/07/24 0419  WBC 13.2* 12.8* 10.3 8.2 6.5 6.0 6.5  NEUTROABS 10.8* 10.0*  --   --   --   --   --   HGB 9.1* 8.7* 8.4* 8.5* 9.3* 7.9* 8.2*  HCT 28.3* 27.4* 26.0* 27.0* 28.2* 25.5* 25.0*  MCV 97.9 100.0 100.4* 102.7* 100.4* 100.0 102.5*  PLT 207 205 215 231 132* 220 241     HbA1C: Hgb A1c MFr Bld  Date/Time Value Ref Range Status  12/31/2023 08:39 AM 5.3 4.8 - 5.6 % Final    Comment:    (NOTE)         Prediabetes: 5.7 - 6.4         Diabetes: >6.4         Glycemic control for adults with diabetes: <7.0     Urinalysis: No results for input(s): COLORURINE, LABSPEC, PHURINE, GLUCOSEU, HGBUR, BILIRUBINUR, KETONESUR, PROTEINUR, UROBILINOGEN, NITRITE, LEUKOCYTESUR in the last 72 hours.  Invalid input(s): APPERANCEUR    Imaging: No results found.    Medications:  ampicillin  (OMNIPEN) IV Stopped (01/07/24 1332)   furosemide  (LASIX ) 200 mg in dextrose  5 % 100 mL (2 mg/mL) infusion 4 mg/hr (01/07/24 1408)    (feeding supplement) PROSource Plus  30 mL Oral BID BM   alteplase   2 mg Intracatheter Once   apixaban   5 mg Oral BID   vitamin C   500 mg Oral Daily   brimonidine   1 drop Both Eyes TID   Chlorhexidine  Gluconate Cloth  6 each Topical Daily   diclofenac  Sodium  2 g Topical BID   dorzolamide   1 drop Left Eye TID   doxazosin   1 mg Oral Daily   DULoxetine   30 mg Oral Daily   feeding supplement (NEPRO CARB STEADY)  237 mL Oral BID BM   iron  polysaccharides  150 mg Oral Daily   latanoprost   1 drop Both Eyes QHS   levothyroxine   150 mcg Oral Q0600   lidocaine   1 patch Transdermal Q24H   midodrine   10 mg Oral TID WC   mirtazapine   15 mg Oral  QHS   multivitamin  1 tablet Oral QHS   Vitamin D  (Ergocalciferol )  50,000 Units Oral Q7 days    Assessment/ Plan:     89 y.o. male with past medical history of hypertension, hypothyroidism, neuropathy, COPD, MGUS, history of PE on apixaban , diastolic heart failure, hyperlipidemia who was admitted on 12/27/2023 for acute hypoxic respiratory failure.   1.  Acute kidney injury/ secondary to ATN  Baseline creatinine 0.9 on 12/13/2023.  Required CRRT this admission. Update: Patient continues to have good urine output and creatinine is stabilized at 1.8.  No further renal input at this time therefore we will sign off.    2.  Acute respiratory failure.  Patient doing well via nasal cannula.   3.  Hypotension secondary to systemic shock from sepsis and volume depletion.  Okay to maintain the patient on midodrine .  4. Generalized edema -remains on low-dose Lasix  drip.  Have a good diuretic response.  Could consider transitioning to p.o. diuretics but defer to primary team.  5. E Faecalis sepais and Complicated UTI from indwelling foley. Rx with iv Ampicillin . ID team following  Given significant renal function improvement we will sign off.  No further renal input at this time.   LOS: 11 Daryan Buell Marcelino, MD [facility] 1/11/20254:10 PM

## 2024-01-07 NOTE — Plan of Care (Signed)
  Problem: Education: Goal: Knowledge of General Education information will improve Description: Including pain rating scale, medication(s)/side effects and non-pharmacologic comfort measures Outcome: Progressing   Problem: Clinical Measurements: Goal: Respiratory complications will improve Outcome: Progressing Goal: Cardiovascular complication will be avoided Outcome: Progressing   Problem: Activity: Goal: Risk for activity intolerance will decrease Outcome: Progressing   Problem: Coping: Goal: Level of anxiety will decrease Outcome: Progressing   Problem: Elimination: Goal: Will not experience complications related to bowel motility Outcome: Progressing Goal: Will not experience complications related to urinary retention Outcome: Progressing   Problem: Pain Management: Goal: General experience of comfort will improve Outcome: Progressing   Problem: Safety: Goal: Ability to remain free from injury will improve Outcome: Progressing   Problem: Skin Integrity: Goal: Risk for impaired skin integrity will decrease Outcome: Progressing   Problem: Coping: Goal: Ability to adjust to condition or change in health will improve Outcome: Progressing   Problem: Fluid Volume: Goal: Ability to maintain a balanced intake and output will improve Outcome: Progressing   Problem: Nutritional: Goal: Maintenance of adequate nutrition will improve Outcome: Progressing   Problem: Skin Integrity: Goal: Risk for impaired skin integrity will decrease Outcome: Progressing   Problem: Tissue Perfusion: Goal: Adequacy of tissue perfusion will improve Outcome: Progressing

## 2024-01-08 DIAGNOSIS — J9601 Acute respiratory failure with hypoxia: Secondary | ICD-10-CM | POA: Diagnosis not present

## 2024-01-08 LAB — CBC
HCT: 26.5 % — ABNORMAL LOW (ref 39.0–52.0)
Hemoglobin: 8.2 g/dL — ABNORMAL LOW (ref 13.0–17.0)
MCH: 31.1 pg (ref 26.0–34.0)
MCHC: 30.9 g/dL (ref 30.0–36.0)
MCV: 100.4 fL — ABNORMAL HIGH (ref 80.0–100.0)
Platelets: 225 10*3/uL (ref 150–400)
RBC: 2.64 MIL/uL — ABNORMAL LOW (ref 4.22–5.81)
RDW: 15 % (ref 11.5–15.5)
WBC: 6.1 10*3/uL (ref 4.0–10.5)
nRBC: 0 % (ref 0.0–0.2)

## 2024-01-08 LAB — BASIC METABOLIC PANEL
Anion gap: 10 (ref 5–15)
BUN: 37 mg/dL — ABNORMAL HIGH (ref 8–23)
CO2: 35 mmol/L — ABNORMAL HIGH (ref 22–32)
Calcium: 8.8 mg/dL — ABNORMAL LOW (ref 8.9–10.3)
Chloride: 97 mmol/L — ABNORMAL LOW (ref 98–111)
Creatinine, Ser: 1.85 mg/dL — ABNORMAL HIGH (ref 0.61–1.24)
GFR, Estimated: 35 mL/min — ABNORMAL LOW (ref >60)
Glucose, Bld: 93 mg/dL (ref 70–99)
Potassium: 3.5 mmol/L (ref 3.5–5.1)
Sodium: 142 mmol/L (ref 135–145)

## 2024-01-08 LAB — GLUCOSE, CAPILLARY: Glucose-Capillary: 125 mg/dL — ABNORMAL HIGH (ref 70–99)

## 2024-01-08 MED ORDER — ORAL CARE MOUTH RINSE
15.0000 mL | OROMUCOSAL | Status: DC
  Administered 2024-01-08 – 2024-01-11 (×11): 15 mL via OROMUCOSAL

## 2024-01-08 MED ORDER — FUROSEMIDE 40 MG PO TABS
40.0000 mg | ORAL_TABLET | Freq: Every day | ORAL | Status: DC
  Administered 2024-01-08 – 2024-01-09 (×2): 40 mg via ORAL
  Filled 2024-01-08 (×2): qty 2

## 2024-01-08 MED ORDER — ORAL CARE MOUTH RINSE: 15.0000 mL | OROMUCOSAL | Status: DC | PRN

## 2024-01-08 NOTE — Plan of Care (Signed)
  Problem: Education: Goal: Knowledge of General Education information will improve Description: Including pain rating scale, medication(s)/side effects and non-pharmacologic comfort measures Outcome: Progressing   Problem: Clinical Measurements: Goal: Ability to maintain clinical measurements within normal limits will improve Outcome: Progressing Goal: Will remain free from infection Outcome: Progressing Goal: Diagnostic test results will improve Outcome: Progressing Goal: Respiratory complications will improve Outcome: Progressing Goal: Cardiovascular complication will be avoided Outcome: Progressing   Problem: Activity: Goal: Risk for activity intolerance will decrease Outcome: Not Progressing   Problem: Nutrition: Goal: Adequate nutrition will be maintained Outcome: Progressing   Problem: Coping: Goal: Level of anxiety will decrease Outcome: Progressing   Problem: Elimination: Goal: Will not experience complications related to bowel motility Outcome: Progressing Goal: Will not experience complications related to urinary retention Outcome: Progressing   Problem: Pain Management: Goal: General experience of comfort will improve Outcome: Progressing   Problem: Safety: Goal: Ability to remain free from injury will improve Outcome: Progressing   Problem: Skin Integrity: Goal: Risk for impaired skin integrity will decrease Outcome: Progressing

## 2024-01-08 NOTE — Progress Notes (Signed)
 PROGRESS NOTE    Daniel Carr  FMW:[id number] DOB: [birth year] DOA: 12/27/2023 PCP: Gasper Nancyann BRAVO, MD    Brief Narrative:  89 y.o. Male admitted with Acute Metabolic Encephalopathy, Acute Hypoxic Hypercapnic Respiratory Failure in the setting of Severe Sepsis with Septic Shock due to Enterococcus Faecalis BACTEREMIA due to UTI, transaminitis and AKI requiring intubation and mechanical ventilation, along with initiation of CRRT    This is an 89 yo male who presented to [facility] ER via EMS on 12/31 from [facility] with altered mental status and acute respiratory failure.  Pts family reports he has had worsening bilateral lower extremity edema and decreased urinary output.  His PCP increased his outpatient lasix  dose to 40 mg daily, however his urinary output did not improve.  Pts granddaughter reports he also c/o chills and fevers.  He was recently hospitalized at [facility] on 12/02/23 with a right humerus fracture from a mechanical fall on 12/3 and evaluated by ortho who recommended a sling for comfort and outpatient follow-up.  However, during presentation he was found to have progressive dyspnea; severe pulmonary hypertension; hyponatremia; acute blood loss anemia suspected secondary to right chest wall hematoma and acute kidney injury on chronic kidney disease requiring hospital admission.  Following treatment he was discharged on 12/17 to [facility] for rehab and instructed not to resume his outpatient apixaban  until 12/18.  His daughter reports the pts apixaban  has been resumed as instructed.     ED Course  Upon arrival to the ER pt in severe respiratory distress, hypotensive, and minimally responsive requiring mechanical intubation.  Significant lab results were: Na+ 125/K+ 5.9/chloride 86/BUN 72/creatinine 4.46/calcium 8.7/albumin  2.5/AST 143/ALT 89/troponin 550/lactic acid 2.4/wbc 15.9/hgb 11.2/PT 25.0/INR 2.2/APTT 48.  CXR concerning for possible small right pleural effusion.  He met sepsis  criteria and received: 2L saline bolus; vancomycin , and aztreonam .  Despite iv fluid resuscitation pt remained hypotensive requiring levophed  gtt.  PCCM team contacted for ICU  admission.   Significant Hospital Events 12/31: Pt admitted with acute hypercapnic respiratory failure, septic shock, AECOPD, pneumonia suspect aspiration, acute kidney injury, acute on chronic hyponatremia, transaminitis, and acute metabolic encephalopathy mechanical intubation  12/27/22- patient with improved mentation post CRRT.  Reducing his sedatives on MV today.  Sepsis bacteremia, ID on case.  He is oozing blood from IV sites, will order DIC panel and pathology slide review.   12/29/23- patient following verbal communication this am, remains in anasarca. Bacteremia being treated. He has CRRT with now removal of fluid at 100/hr.  Starting NG feeds.  Tracheal aspirate with GPCs in pairs.  ID on case continues with ampicillin  reducing steroids and off levophed  today. 12/30/23- patient for SBT today.  Last day of steroids today. Failed SBT today.  S/p iD evaluation 12/31/23-patient liberated from MV today on BIPAP, required precedex  for aggitation. Remain on ampicillin . Met with daughter and granddaughter few times to review findings and medical plan.  01/01/24- patient improved overnight, weaned of bipap on hfnc, remains edematous but improved on CRRT with 100/h UF, met with family reviewed medical plan, ordering PT/OT for tommorow. Remains on [place] heparin  with CTEPH/chronic DVTs. CRRT stopped due to clotting filter. 01/02/24- Off BiPAP and Precedex , respiratory and mental status improving.  Vasopressors weaned off.  Creatinine increased, Urine output improved, Nephrology to decide on whether to resume CRRT vs trial intermittent HD vs observation.  Resume outpatient Eliquis  at reduced dose (2.5 mg BID) for chronic bilateral LE DVT's.  1/7: Transferred to [facility] hospitalist service   Assessment &  Plan:   Principal Problem:   Acute  hypoxemic respiratory failure (HCC) Active Problems:   Bacteremia due to Enterococcus   AKI (acute kidney injury) (HCC)   Complicated UTI (urinary tract infection) # Septic Shock ~ RESOLVED # Acute on Chronic HFpEF # Elevated Troponin, suspect demand ischemia  # CTEPH PMHx: HTN, aortic stenosis, PE, HLD Echocardiogram 12/28/23: LVEF 60-65%, indeterminate diastolic parameters, moderate Aortic stenosis Shock physiology has resolved Plan: Continue midodrine  10 mg 3 times daily, wean if tolerated MAP goal 65 Okay for transfer to progressive unit   # Severe Sepsis  # Enterococcus Faecalis BACTEREMIA # Enterococcus Faecalis UTI # Questionable Pneumonia -Monitor fever curve -Trend WBC's & Procalcitonin -Follow cultures as above -ID is following, appreciate input ~ Continue empiric Ampicillin  pending cultures & sensitivities Continue antibiotics for 2-week total.  End date 1/14   # Acute Hypoxic & Hypercapnic Respiratory Failure # AECOPD -EXTUBATED 1/4 -Supplemental O2 as needed to maintain O2 sats 88 to 92% -BiPAP qhs    # Acute kidney injury suspect secondary to ATN and diuretic therapy  # Hyperkalemia ~ RESOLVED # Acute on chronic hyponatremia ~ RESOLVED # Lactic acidosis, Resolved  Patient still with peripheral edema Diuresing well on Lasix  gtt. Nephrology following Serum creatinine stable Plan: Discontinue Lasix  gtt. Start oral Lasix  Continue Foley catheter for accurate urine output measurement Nephrology signed off     # Previous Right Humerus Fracture # Chronic Bilateral Lower Extremity DVT's Continue Eliquis  5 mg p.o. twice daily   # Anemia of chronic disease Iron  deficiency, transferrin saturation 12% Continue oral iron  supplement -Monitor for S/Sx of bleeding -Trend CBC -Resume home Eliquis  for AC/VTE Prophylaxis  -Transfuse for Hgb <7   # Hyperglycemia, Resolved  -CBG's ac & qhs; Target range of 140 to 180    # Hypothyroidism: Continue Synthroid    #  Acute Metabolic Encephalopathy: resolved  CT Head on 12/31 negative for acute intracranial process -Treatment of metabolic derangements as outlined above -Provide supportive care -Promote normal sleep/wake cycle and family presence -Avoid sedating medications as able   # Chronic urinary retention Patient failed voiding trial, Foley catheter was reinserted on 01/03/2024 Started Flomax  Follow voiding trial when patient is ambulatory, continue Foley catheter in the meantime   # Vitamin D  insufficiency: started vitamin D  50,000 units p.o. weekly, follow with PCP to repeat vitamin D  level after 3 to 6 months.  Intractable pain Functional decline Patient's mobility efforts appear to be limited by pain.  I do lengthy discussion with patient and daughter at bedside on 1/10.  Encouraged him to take the pain medication in order to promote further mobility efforts     DVT prophylaxis: Eliquis  Code Status: Full Family Communication: Daughter at bedside 1/10, 1/12 Disposition Plan: Status is: Inpatient Remains inpatient appropriate because: Multiple acute issues as above   Level of care: Progressive  Consultants:  Nephrology  Procedures:  None  Antimicrobials: None   Subjective: Seen and examined.  Mentating clearly but appears very weak and fatigued  Objective: Vitals:   01/08/24 0705 01/08/24 0900 01/08/24 0940 01/08/24 1200  BP:   113/69   Pulse:      Resp:      Temp:  98.9 F (37.2 C)  99.1 F (37.3 C)  TempSrc:  Oral  Oral  SpO2:      Weight: 92.4 kg     Height:        Intake/Output Summary (Last 24 hours) at 01/08/2024 1537 Last data filed at 01/08/2024 1504  Gross per 24 hour  Intake 678.28 ml  Output 1725 ml  Net -1046.72 ml   Filed Weights   01/06/24 0339 01/07/24 0418 01/08/24 0705  Weight: 94.8 kg 92.4 kg 92.4 kg    Examination:  General exam: Fatigued, weak, physically deconditioned Respiratory system: Poor respiratory effort.  Normal work of  breathing.  2 L Cardiovascular system: 1 S2, RRR, no murmurs, 1+ pitting edema BLE Gastrointestinal system: Soft, NT/ND, normal bowel sounds Central nervous system: Alert and oriented. No focal neurological deficits. Extremities: Markedly decreased power bilateral upper and lower extremities Skin: No rashes, lesions or ulcers Psychiatry: Judgement and insight appear normal. Mood & affect flattened.     Data Reviewed: I have personally reviewed following labs and imaging studies  CBC: Recent Labs  Lab 01/02/24 0400 01/03/24 0503 01/04/24 0514 01/05/24 0623 01/06/24 0621 01/07/24 0419 01/08/24 0430  WBC 12.8*   < > 8.2 6.5 6.0 6.5 6.1  NEUTROABS 10.0*  --   --   --   --   --   --   HGB 8.7*   < > 8.5* 9.3* 7.9* 8.2* 8.2*  HCT 27.4*   < > 27.0* 28.2* 25.5* 25.0* 26.5*  MCV 100.0   < > 102.7* 100.4* 100.0 102.5* 100.4*  PLT 205   < > 231 132* 220 241 225   < > = values in this interval not displayed.   Basic Metabolic Panel: Recent Labs  Lab 01/02/24 0400 01/02/24 1459 01/03/24 0503 01/04/24 0514 01/05/24 0623 01/06/24 0621 01/07/24 0419 01/08/24 0430  NA 137 135 139 144 137 141 141 142  K 4.1 4.6 4.3 4.6 4.1 3.8 3.6 3.5  CL 104 102 106 107 100 102 100 97*  CO2 26 20* 25 26 27 30  33* 35*  GLUCOSE 111* 117* 95 104* 90 97 105* 93  BUN 49* 50* 47* 48* 44* 40* 37* 37*  CREATININE 1.75* 1.83* 1.73* 1.77* 1.77* 1.81* 1.87* 1.85*  CALCIUM 8.7* 8.9 8.3* 9.0 8.6* 8.5* 8.5* 8.8*  MG 2.0  --  2.2 2.3 2.2 2.0  --   --   PHOS 4.6 4.5 4.3 3.1 3.8 3.0  --   --    GFR: Estimated Creatinine Clearance: 32.4 mL/min (A) (by C-G formula based on SCr of 1.85 mg/dL (H)). Liver Function Tests: Recent Labs  Lab 01/01/24 1603 01/02/24 0400 01/02/24 1459 01/03/24 0503  ALBUMIN  2.4* 2.3* 2.5* 2.2*   No results for input(s): LIPASE, AMYLASE in the last 168 hours. No results for input(s): AMMONIA in the last 168 hours. Coagulation Profile: No results for input(s): INR,  PROTIME in the last 168 hours. Cardiac Enzymes: No results for input(s): CKTOTAL, CKMB, CKMBINDEX, TROPONINI in the last 168 hours. BNP (last 3 results) No results for input(s): PROBNP in the last 8760 hours. HbA1C: No results for input(s): HGBA1C in the last 72 hours. CBG: Recent Labs  Lab 01/04/24 2020 01/04/24 2215 01/04/24 2357 01/05/24 0425 01/05/24 0749  GLUCAP 108* 100* 96 78 86   Lipid Profile: No results for input(s): CHOL, HDL, LDLCALC, TRIG, CHOLHDL, LDLDIRECT in the last 72 hours. Thyroid  Function Tests: No results for input(s): TSH, T4TOTAL, FREET4, T3FREE, THYROIDAB in the last 72 hours. Anemia Panel: No results for input(s): VITAMINB12, FOLATE, FERRITIN, TIBC, IRON , RETICCTPCT in the last 72 hours. Sepsis Labs: No results for input(s): PROCALCITON, LATICACIDVEN in the last 168 hours.  Recent Results (from the past 240 hours)  Culture, blood (Routine X 2) w Reflex to ID Panel  Status: None   Collection Time: 01/02/24  2:58 PM   Specimen: BLOOD  Result Value Ref Range Status   Specimen Description BLOOD BLOOD LEFT HAND  Final   Special Requests   Final    BOTTLES DRAWN AEROBIC AND ANAEROBIC Blood Culture results may not be optimal due to an inadequate volume of blood received in culture bottles   Culture   Final    NO GROWTH 5 DAYS Performed at [facility], [address] [place], [place] [address]    Report Status 01/07/2024 FINAL  Final  Culture, blood (Routine X 2) w Reflex to ID Panel     Status: None   Collection Time: 01/02/24  3:06 PM   Specimen: BLOOD  Result Value Ref Range Status   Specimen Description BLOOD BLOOD LEFT WRIST  Final   Special Requests   Final    BOTTLES DRAWN AEROBIC AND ANAEROBIC Blood Culture adequate volume   Culture   Final    NO GROWTH 5 DAYS Performed at [facility], [address] [place], [place] [address]    Report Status 01/07/2024 FINAL   Final         Radiology Studies: No results found.      Scheduled Meds:  (feeding supplement) PROSource Plus  30 mL Oral BID BM   alteplase   2 mg Intracatheter Once   apixaban   5 mg Oral BID   vitamin C   500 mg Oral Daily   brimonidine   1 drop Both Eyes TID   Chlorhexidine  Gluconate Cloth  6 each Topical Daily   diclofenac  Sodium  2 g Topical BID   dorzolamide   1 drop Left Eye TID   doxazosin   1 mg Oral Daily   DULoxetine   30 mg Oral Daily   feeding supplement (NEPRO CARB STEADY)  237 mL Oral BID BM   furosemide   40 mg Oral Daily   iron  polysaccharides  150 mg Oral Daily   latanoprost   1 drop Both Eyes QHS   levothyroxine   150 mcg Oral Q0600   lidocaine   1 patch Transdermal Q24H   midodrine   10 mg Oral TID WC   mirtazapine   15 mg Oral QHS   multivitamin  1 tablet Oral QHS   mouth rinse  15 mL Mouth Rinse 4 times per day   Vitamin D  (Ergocalciferol )  50,000 Units Oral Q7 days   Continuous Infusions:  ampicillin  (OMNIPEN) IV Stopped (01/08/24 1236)     LOS: 12 days    Calvin KATHEE Robson, MD [facility]   If 7PM-7AM, please contact night-coverage  01/08/2024, 3:37 PM

## 2024-01-09 DIAGNOSIS — J9601 Acute respiratory failure with hypoxia: Secondary | ICD-10-CM | POA: Diagnosis not present

## 2024-01-09 DIAGNOSIS — E440 Moderate protein-calorie malnutrition: Secondary | ICD-10-CM | POA: Insufficient documentation

## 2024-01-09 LAB — GLUCOSE, CAPILLARY
Glucose-Capillary: 113 mg/dL — ABNORMAL HIGH (ref 70–99)
Glucose-Capillary: 126 mg/dL — ABNORMAL HIGH (ref 70–99)
Glucose-Capillary: 98 mg/dL (ref 70–99)

## 2024-01-09 MED ORDER — ALBUMIN HUMAN 25 % IV SOLN
12.5000 g | Freq: Four times a day (QID) | INTRAVENOUS | Status: AC
  Administered 2024-01-09 – 2024-01-11 (×10): 12.5 g via INTRAVENOUS
  Filled 2024-01-09 (×10): qty 50

## 2024-01-09 MED ORDER — ENSURE ENLIVE PO LIQD
237.0000 mL | Freq: Two times a day (BID) | ORAL | Status: DC
  Administered 2024-01-09: 237 mL via ORAL

## 2024-01-09 MED ORDER — ADULT MULTIVITAMIN W/MINERALS CH
1.0000 | ORAL_TABLET | Freq: Every day | ORAL | Status: DC
  Administered 2024-01-10: 1 via ORAL
  Filled 2024-01-09: qty 1

## 2024-01-09 MED ORDER — NEPRO/CARBSTEADY PO LIQD
237.0000 mL | Freq: Three times a day (TID) | ORAL | Status: DC
Start: 2024-01-09 — End: 2024-01-10
  Administered 2024-01-09 – 2024-01-10 (×2): 237 mL via ORAL

## 2024-01-09 NOTE — Progress Notes (Signed)
   01/09/24 1000  Spiritual Encounters  Type of Visit Initial  Care provided to: Pt and family  Referral source Chaplain assessment  Reason for visit Routine spiritual support  OnCall Visit No  Spiritual Framework  Presenting Themes Meaning/purpose/sources of inspiration;Community and relationships  Patient Stress Factors Not reviewed;Major life changes  Family Stress Factors Major life changes;Exhausted  Interventions  Spiritual Care Interventions Made Established relationship of care and support;Compassionate presence;Reflective listening;Normalization of emotions  Intervention Outcomes  Outcomes Connection to spiritual care;Awareness of support  Spiritual Care Plan  Spiritual Care Issues Still Outstanding Chaplain will continue to follow   Spoke with the patient's daughter patient was asleep. Patient is physical tried and not eating well. Daughter has been with patient for past 14 days for all day but 3 hours of the day. Patient daughter is tried and dealing also with a sick mother at home. Let patient daughter know that we are here 24/7 if she needs support during this time.

## 2024-01-09 NOTE — Progress Notes (Addendum)
 Nutrition Follow-up  DOCUMENTATION CODES:   Non-severe (moderate) malnutrition in context of chronic illness  INTERVENTION:   Nepro Shake po TID, each supplement provides 425 kcal and 19 grams protein  Magic cup TID with meals, each supplement provides 290 kcal and 9 grams of protein  Pro-Source Plus 30ml po BID- Each supplement provides 100kcal and 15g protein   MVI po daily   Daniel Carr at high refeed risk; recommend monitor potassium, magnesium  and phosphorus labs daily until stable  Daily weights   NUTRITION DIAGNOSIS:   Moderate Malnutrition related to chronic illness as evidenced by moderate fat depletion, moderate muscle depletion. -new diagnosis   GOAL:   Patient will meet greater than or equal to 90% of their needs -progressing   MONITOR:   PO intake, Supplement acceptance, Labs, Weight trends, I & O's, Skin  ASSESSMENT:   89 y.o. male with h/o HLD, HTN, hypothyroidism, COPD, glaucoma, BPH, MGUS, PE (2022), CHF, aortic stenosis and recent humerus fracture s/p mechanical fall (12/3) who is admitted with PNA, sepsis, AKI, anemia and shock.  Met with Daniel Carr and Daniel Carr's daughter in room today. Daughter reports Daniel Carr with poor oral intake at baseline and in hospital. Daughter has been encouraging Daniel Carr to eat and drink. Daughter reports that patient ate 100% of a bowel of oatmeal this morning with prosource mixed in it and drank a Nepro supplement. Daniel Carr is documented to be eating <50% of most meals. RD discussed with Daniel Carr and daughter the importance of adequate nutrition needed to preserve lean muscle. Discussed that NGT placement and nutrition support would be recommended if patient's oral intake does not improve. Daughter is agreeable to NGT if needed but would like to give the patient a few days to try and improve his oral intake. Daniel Carr remains at high refeed risk. Daniel Carr no longer requiring dialysis. Per chart, Daniel Carr is down ~28lbs since admission but appears to be back around his UBW. Daniel Carr still noted to  have significant edema; however, edema is improved enough that repeat NFPE reveals criteria for malnutrition. Recommend continue supplements. RD will add MVI and Magic Cups. Recommend NGT placement and nutrition support of Daniel Carr's oral intake does not improve over the next 48 hrs. No BM since 1/10.   Medications reviewed and include: vitamin C , lasix , iron , synthroid , midodrine , remeron , vitamin D , albumin , omnipen  Labs reviewed: K 3.5 wnl, BUN 37(H), creat 1.85(H) P 3.0 wnl, Mg 2.0 wnl- 1/10 Hgb 8.2(L), Hct 26.5(L) Cbgs- 126, 98, 113 x 24 hrs  NUTRITION - FOCUSED PHYSICAL EXAM:  Flowsheet Row Most Recent Value  Orbital Region No depletion  Upper Arm Region Moderate depletion  Thoracic and Lumbar Region No depletion  Buccal Region Moderate depletion  Temple Region Mild depletion  Clavicle Bone Region Moderate depletion  Clavicle and Acromion Bone Region Moderate depletion  Scapular Bone Region Mild depletion  Dorsal Hand Mild depletion  Patellar Region Moderate depletion  Anterior Thigh Region Moderate depletion  Posterior Calf Region Moderate depletion  Edema (RD Assessment) Severe  Hair Reviewed  Eyes Reviewed  Mouth Reviewed  Skin Reviewed  Nails Reviewed   Diet Order:   Diet Order             DIET DYS 3 Room service appropriate? Yes; Fluid consistency: Thin; Fluid restriction: 1500 mL Fluid  Diet effective now                  EDUCATION NEEDS:   Education needs have been addressed  Skin:  Skin Assessment: Reviewed  RN Assessment (wound R buttocks)  Last BM:  1/10- type 6  Height:   Ht Readings from Last 1 Encounters:  12/29/23 6' 0.99 (1.854 m)    Weight:   Wt Readings from Last 1 Encounters:  01/09/24 94.9 kg    Ideal Body Weight:  83.6 kg  BMI:  Body mass index is 27.61 kg/m.  Estimated Nutritional Needs:   Kcal:  2200-2500kcal/day  Protein:  110-125g/day  Fluid:  2.1-2.4L/day  Augustin Shams MS, RD, LDN If unable to be reached, please  send secure chat to RD inpatient available from 8:00a-4:00p daily

## 2024-01-09 NOTE — Progress Notes (Signed)
 Physical Therapy Treatment Patient Details Name: Daniel Carr MRN: [id number] DOB: 05-26-1935 Today's Date: 01/09/2024   History of Present Illness 89 y.o. Male admitted with Acute Metabolic Encephalopathy, Acute Hypoxic Hypercapnic Respiratory Failure in the setting of Severe Sepsis with Septic Shock due to Enterococcus Faecalis BACTEREMIA due to UTI, transaminitis and AKI requiring intubation and mechanical ventilation, along with initiation of CRRT.  Pt had fall with prox R humerus fx 12/3, was hospitalized at [facility], d/c to [facility] [employer]) 12/17.    PT Comments  Patient is agreeable to PT session. Generalized pain reported with all mobility efforts. Total assistance required for rolling in bed. Increased respiration rate with mobility with bed level exercises with frequent rest breaks required. Recommend to continue PT to maximize independence. Rehabilitation < 3 hours/day recommended after this hospital stay.    If plan is discharge home, recommend the following: Two people to help with walking and/or transfers;Two people to help with bathing/dressing/bathroom;Assistance with cooking/housework;Assistance with feeding;Assist for transportation   Can travel by private vehicle     No  Equipment Recommendations   (to be determined at next level of care)    Recommendations for Other Services       Precautions / Restrictions Precautions Precautions: Fall Restrictions Weight Bearing Restrictions Per Provider Order: Yes (No formal orders but patient has fracture RUE with sling in place) RUE Weight Bearing Per Provider Order: Non weight bearing     Mobility  Bed Mobility Overal bed mobility: Needs Assistance Bed Mobility: Rolling Rolling: Total assist         General bed mobility comments: total assistance for rolling to the right and repositioning in the bed. limited participation with bed mobility efforts. would certianly require +2 person assistance for further mobility     Transfers                        Ambulation/Gait                   Stairs             Wheelchair Mobility     Tilt Bed    Modified Rankin (Stroke Patients Only)       Balance                                            Cognition Arousal: Alert Behavior During Therapy: Flat affect Overall Cognitive Status: No family/caregiver present to determine baseline cognitive functioning                         Following Commands: Follows one step commands with increased time Safety/Judgement: Decreased awareness of deficits   Problem Solving: Slow processing, Decreased initiation, Requires verbal cues, Requires tactile cues          Exercises General Exercises - Lower Extremity Ankle Circles/Pumps: AROM, Strengthening, Both, 10 reps, Supine Heel Slides: AROM, Strengthening, Both, 10 reps, Supine Hip ABduction/ADduction: AAROM, Strengthening, Both, 10 reps, Supine Other Exercises Other Exercises: increased respiration rate into the 30's and Sp02 down to 89% with in bed exercise. frequent rest breaks required with mobility with cues for breathing techniques. right arm adjusted slightly for forearm/wrist/hand support with pain reported.    General Comments        Pertinent Vitals/Pain Pain Assessment Pain Assessment: Faces Faces Pain Scale: Hurts  even more Pain Location: generalized pain with movement Pain Descriptors / Indicators: Discomfort, Grimacing, Guarding Pain Intervention(s): Limited activity within patient's tolerance, Monitored during session, Repositioned    Home Living                          Prior Function            PT Goals (current goals can now be found in the care plan section) Acute Rehab PT Goals Patient Stated Goal: get strong enough to walk PT Goal Formulation: With patient Time For Goal Achievement: 01/16/24 Potential to Achieve Goals: Good Progress towards PT goals:  Progressing toward goals    Frequency    Min 1X/week      PT Plan      Co-evaluation              AM-PAC PT 6 Clicks Mobility   Outcome Measure  Help needed turning from your back to your side while in a flat bed without using bedrails?: Total Help needed moving from lying on your back to sitting on the side of a flat bed without using bedrails?: Total Help needed moving to and from a bed to a chair (including a wheelchair)?: Total Help needed standing up from a chair using your arms (e.g., wheelchair or bedside chair)?: Total Help needed to walk in hospital room?: Total Help needed climbing 3-5 steps with a railing? : Total 6 Click Score: 6    End of Session Equipment Utilized During Treatment: Oxygen  Activity Tolerance: Patient limited by fatigue Patient left: in bed;with call bell/phone within reach Nurse Communication: Mobility status PT Visit Diagnosis: Muscle weakness (generalized) (M62.81);Difficulty in walking, not elsewhere classified (R26.2);Pain;Unsteadiness on feet (R26.81)     Time: 8687-8670 PT Time Calculation (min) (ACUTE ONLY): 17 min  Charges:    $Therapeutic Activity: 8-22 mins PT General Charges $$ ACUTE PT VISIT: 1 Visit                     Randine Essex, PT, MPT    Randine LULLA Essex 01/09/2024, 2:08 PM

## 2024-01-09 NOTE — Progress Notes (Addendum)
 PROGRESS NOTE    Daniel Carr  FMW:[id number] DOB: [birth year] DOA: 12/27/2023 PCP: Gasper Nancyann BRAVO, MD    Brief Narrative:  89 y.o. Male admitted with Acute Metabolic Encephalopathy, Acute Hypoxic Hypercapnic Respiratory Failure in the setting of Severe Sepsis with Septic Shock due to Enterococcus Faecalis BACTEREMIA due to UTI, transaminitis and AKI requiring intubation and mechanical ventilation, along with initiation of CRRT    This is an 89 yo male who presented to [facility] ER via EMS on 12/31 from [facility] with altered mental status and acute respiratory failure.  Pts family reports he has had worsening bilateral lower extremity edema and decreased urinary output.  His PCP increased his outpatient lasix  dose to 40 mg daily, however his urinary output did not improve.  Pts granddaughter reports he also c/o chills and fevers.  He was recently hospitalized at [facility] on 12/02/23 with a right humerus fracture from a mechanical fall on 12/3 and evaluated by ortho who recommended a sling for comfort and outpatient follow-up.  However, during presentation he was found to have progressive dyspnea; severe pulmonary hypertension; hyponatremia; acute blood loss anemia suspected secondary to right chest wall hematoma and acute kidney injury on chronic kidney disease requiring hospital admission.  Following treatment he was discharged on 12/17 to [facility] for rehab and instructed not to resume his outpatient apixaban  until 12/18.  His daughter reports the pts apixaban  has been resumed as instructed.     ED Course  Upon arrival to the ER pt in severe respiratory distress, hypotensive, and minimally responsive requiring mechanical intubation.  Significant lab results were: Na+ 125/K+ 5.9/chloride 86/BUN 72/creatinine 4.46/calcium 8.7/albumin  2.5/AST 143/ALT 89/troponin 550/lactic acid 2.4/wbc 15.9/hgb 11.2/PT 25.0/INR 2.2/APTT 48.  CXR concerning for possible small right pleural effusion.  He met sepsis  criteria and received: 2L saline bolus; vancomycin , and aztreonam .  Despite iv fluid resuscitation pt remained hypotensive requiring levophed  gtt.  PCCM team contacted for ICU  admission.   Significant Hospital Events 12/31: Pt admitted with acute hypercapnic respiratory failure, septic shock, AECOPD, pneumonia suspect aspiration, acute kidney injury, acute on chronic hyponatremia, transaminitis, and acute metabolic encephalopathy mechanical intubation  12/27/22- patient with improved mentation post CRRT.  Reducing his sedatives on MV today.  Sepsis bacteremia, ID on case.  He is oozing blood from IV sites, will order DIC panel and pathology slide review.   12/29/23- patient following verbal communication this am, remains in anasarca. Bacteremia being treated. He has CRRT with now removal of fluid at 100/hr.  Starting NG feeds.  Tracheal aspirate with GPCs in pairs.  ID on case continues with ampicillin  reducing steroids and off levophed  today. 12/30/23- patient for SBT today.  Last day of steroids today. Failed SBT today.  S/p iD evaluation 12/31/23-patient liberated from MV today on BIPAP, required precedex  for aggitation. Remain on ampicillin . Met with daughter and granddaughter few times to review findings and medical plan.  01/01/24- patient improved overnight, weaned of bipap on hfnc, remains edematous but improved on CRRT with 100/h UF, met with family reviewed medical plan, ordering PT/OT for tommorow. Remains on [place] heparin  with CTEPH/chronic DVTs. CRRT stopped due to clotting filter. 01/02/24- Off BiPAP and Precedex , respiratory and mental status improving.  Vasopressors weaned off.  Creatinine increased, Urine output improved, Nephrology to decide on whether to resume CRRT vs trial intermittent HD vs observation.  Resume outpatient Eliquis  at reduced dose (2.5 mg BID) for chronic bilateral LE DVT's.  1/7: Transferred to [facility] hospitalist service   Assessment &  Plan:   Principal Problem:   Acute  hypoxemic respiratory failure (HCC) Active Problems:   Bacteremia due to Enterococcus   AKI (acute kidney injury) (HCC)   Complicated UTI (urinary tract infection) # Septic Shock ~ RESOLVED # Acute on Chronic HFpEF # Elevated Troponin, suspect demand ischemia  # CTEPH PMHx: HTN, aortic stenosis, PE, HLD Echocardiogram 12/28/23: LVEF 60-65%, indeterminate diastolic parameters, moderate Aortic stenosis Shock physiology has resolved Plan: Continue midodrine  10 mg 3 times daily, wean if tolerated MAP goal 65 Okay for transfer to progressive unit   # Severe Sepsis  # Enterococcus Faecalis BACTEREMIA # Enterococcus Faecalis UTI # Questionable Pneumonia -Monitor fever curve -Trend WBC's & Procalcitonin -Follow cultures as above -ID is following, appreciate input ~ Continue empiric Ampicillin  pending cultures & sensitivities Continue antibiotics for 2-week total.  End date 1/14   # Acute Hypoxic & Hypercapnic Respiratory Failure # AECOPD -EXTUBATED 1/4 -Supplemental O2 as needed to maintain O2 sats 88 to 92% -BiPAP qhs    # Acute kidney injury suspect secondary to ATN and diuretic therapy  # Hyperkalemia ~ RESOLVED # Acute on chronic hyponatremia ~ RESOLVED # Lactic acidosis, Resolved  Patient still with peripheral edema Diuresing well on Lasix  gtt. Nephrology following Serum creatinine stable Plan: Continue oral Lasix  Continue Foley catheter for accurate urine output measurement due to critical illness Nephrology signed off     # Previous Right Humerus Fracture # Chronic Bilateral Lower Extremity DVT's Continue Eliquis  5 mg p.o. twice daily   # Anemia of chronic disease Iron  deficiency, transferrin saturation 12% Continue oral iron  supplement -Monitor for S/Sx of bleeding -Trend CBC -Resume home Eliquis  for AC/VTE Prophylaxis  -Transfuse for Hgb <7   # Hyperglycemia, Resolved  -CBG's ac & qhs; Target range of 140 to 180    # Hypothyroidism: Continue Synthroid     # Acute Metabolic Encephalopathy: resolved  CT Head on 12/31 negative for acute intracranial process -Treatment of metabolic derangements as outlined above -Provide supportive care -Promote normal sleep/wake cycle and family presence -Avoid sedating medications as able   # Chronic urinary retention Patient failed voiding trial, Foley catheter was reinserted on 01/03/2024 Started Flomax  Follow voiding trial when patient is ambulatory, continue Foley catheter in the meantime   # Vitamin D  insufficiency: started vitamin D  50,000 units p.o. weekly, follow with PCP to repeat vitamin D  level after 3 to 6 months.  Intractable pain Functional decline Patient's mobility efforts appear to be limited by pain.  I do lengthy discussion with patient and daughter at bedside on 1/10.  Encouraged him to take the pain medication in order to promote further mobility efforts  Moderte protein calorie malnutrition Patient is not meeting his nutritional goals.  If we are still pursuing aggressive care he may need an enteral feeding tube.  Will continue to revisit this with patient and family.    DVT prophylaxis: Eliquis  Code Status: Full Family Communication: Daughter at bedside 1/10, 1/12, 1/13 Disposition Plan: Status is: Inpatient Remains inpatient appropriate because: Multiple acute issues as above   Level of care: Progressive  Consultants:  Nephrology  Procedures:  None  Antimicrobials: None   Subjective: Seen examined.  Daughter at bedside reports poor oral intake today.  Patient seems weak and fatigued.  Objective: Vitals:   01/09/24 1100 01/09/24 1138 01/09/24 1200 01/09/24 1300  BP:   (!) 94/56   Pulse: 98 (!) 101 98 (!) 102  Resp: (!) 25 (!) 28 (!) 26 (!) 34  Temp:  98.9 F (37.2 C)  TempSrc:    Axillary  SpO2: 96% 96% 96% 93%  Weight:      Height:        Intake/Output Summary (Last 24 hours) at 01/09/2024 1307 Last data filed at 01/09/2024 1304 Gross per 24 hour   Intake 1143.95 ml  Output 850 ml  Net 293.95 ml   Filed Weights   01/07/24 0418 01/08/24 0705 01/09/24 0500  Weight: 92.4 kg 92.4 kg 94.9 kg    Examination:  General exam: Fatigued, weak Respiratory system: Poor respiratory effort.  Normal work of breathing.  2 L Cardiovascular system: 1 S2, RRR, no murmurs, 3+ pitting edema BLE Gastrointestinal system: Soft, NT/ND, normal bowel sounds Central nervous system: Alert and oriented. No focal neurological deficits. Extremities: Markedly decreased power bilateral upper and lower extremities Skin: No rashes, lesions or ulcers Psychiatry: Judgement and insight appear normal. Mood & affect flattened.     Data Reviewed: I have personally reviewed following labs and imaging studies  CBC: Recent Labs  Lab 01/04/24 0514 01/05/24 0623 01/06/24 0621 01/07/24 0419 01/08/24 0430  WBC 8.2 6.5 6.0 6.5 6.1  HGB 8.5* 9.3* 7.9* 8.2* 8.2*  HCT 27.0* 28.2* 25.5* 25.0* 26.5*  MCV 102.7* 100.4* 100.0 102.5* 100.4*  PLT 231 132* 220 241 225   Basic Metabolic Panel: Recent Labs  Lab 01/02/24 1459 01/03/24 0503 01/04/24 0514 01/05/24 0623 01/06/24 0621 01/07/24 0419 01/08/24 0430  NA 135 139 144 137 141 141 142  K 4.6 4.3 4.6 4.1 3.8 3.6 3.5  CL 102 106 107 100 102 100 97*  CO2 20* 25 26 27 30  33* 35*  GLUCOSE 117* 95 104* 90 97 105* 93  BUN 50* 47* 48* 44* 40* 37* 37*  CREATININE 1.83* 1.73* 1.77* 1.77* 1.81* 1.87* 1.85*  CALCIUM 8.9 8.3* 9.0 8.6* 8.5* 8.5* 8.8*  MG  --  2.2 2.3 2.2 2.0  --   --   PHOS 4.5 4.3 3.1 3.8 3.0  --   --    GFR: Estimated Creatinine Clearance: 32.4 mL/min (A) (by C-G formula based on SCr of 1.85 mg/dL (H)). Liver Function Tests: Recent Labs  Lab 01/02/24 1459 01/03/24 0503  ALBUMIN  2.5* 2.2*   No results for input(s): LIPASE, AMYLASE in the last 168 hours. No results for input(s): AMMONIA in the last 168 hours. Coagulation Profile: No results for input(s): INR, PROTIME in the last 168  hours. Cardiac Enzymes: No results for input(s): CKTOTAL, CKMB, CKMBINDEX, TROPONINI in the last 168 hours. BNP (last 3 results) No results for input(s): PROBNP in the last 8760 hours. HbA1C: No results for input(s): HGBA1C in the last 72 hours. CBG: Recent Labs  Lab 01/05/24 0749 01/08/24 2025 01/09/24 0019 01/09/24 0724 01/09/24 1135  GLUCAP 86 125* 113* 98 126*   Lipid Profile: No results for input(s): CHOL, HDL, LDLCALC, TRIG, CHOLHDL, LDLDIRECT in the last 72 hours. Thyroid  Function Tests: No results for input(s): TSH, T4TOTAL, FREET4, T3FREE, THYROIDAB in the last 72 hours. Anemia Panel: No results for input(s): VITAMINB12, FOLATE, FERRITIN, TIBC, IRON , RETICCTPCT in the last 72 hours. Sepsis Labs: No results for input(s): PROCALCITON, LATICACIDVEN in the last 168 hours.  Recent Results (from the past 240 hours)  Culture, blood (Routine X 2) w Reflex to ID Panel     Status: None   Collection Time: 01/02/24  2:58 PM   Specimen: BLOOD  Result Value Ref Range Status   Specimen Description BLOOD BLOOD LEFT HAND  Final  Special Requests   Final    BOTTLES DRAWN AEROBIC AND ANAEROBIC Blood Culture results may not be optimal due to an inadequate volume of blood received in culture bottles   Culture   Final    NO GROWTH 5 DAYS Performed at [facility], [address] [place], [place] [address]    Report Status 01/07/2024 FINAL  Final  Culture, blood (Routine X 2) w Reflex to ID Panel     Status: None   Collection Time: 01/02/24  3:06 PM   Specimen: BLOOD  Result Value Ref Range Status   Specimen Description BLOOD BLOOD LEFT WRIST  Final   Special Requests   Final    BOTTLES DRAWN AEROBIC AND ANAEROBIC Blood Culture adequate volume   Culture   Final    NO GROWTH 5 DAYS Performed at [facility], [address], [place], [place] [address]    Report Status 01/07/2024 FINAL  Final          Radiology Studies: No results found.      Scheduled Meds:  (feeding supplement) PROSource Plus  30 mL Oral BID BM   alteplase   2 mg Intracatheter Once   apixaban   5 mg Oral BID   vitamin C   500 mg Oral Daily   brimonidine   1 drop Both Eyes TID   Chlorhexidine  Gluconate Cloth  6 each Topical Daily   diclofenac  Sodium  2 g Topical BID   dorzolamide   1 drop Left Eye TID   doxazosin   1 mg Oral Daily   DULoxetine   30 mg Oral Daily   feeding supplement  237 mL Oral BID BM   furosemide   40 mg Oral Daily   iron  polysaccharides  150 mg Oral Daily   latanoprost   1 drop Both Eyes QHS   levothyroxine   150 mcg Oral Q0600   lidocaine   1 patch Transdermal Q24H   midodrine   10 mg Oral TID WC   mirtazapine   15 mg Oral QHS   multivitamin  1 tablet Oral QHS   mouth rinse  15 mL Mouth Rinse 4 times per day   Vitamin D  (Ergocalciferol )  50,000 Units Oral Q7 days   Continuous Infusions:  albumin  human 12.5 g (01/09/24 1135)   ampicillin  (OMNIPEN) IV 2 g (01/09/24 1132)     LOS: 13 days    Calvin KATHEE Robson, MD [facility]   If 7PM-7AM, please contact night-coverage  01/09/2024, 1:07 PM

## 2024-01-10 DIAGNOSIS — J9601 Acute respiratory failure with hypoxia: Secondary | ICD-10-CM | POA: Diagnosis not present

## 2024-01-10 LAB — BASIC METABOLIC PANEL
Anion gap: 10 (ref 5–15)
BUN: 41 mg/dL — ABNORMAL HIGH (ref 8–23)
CO2: 32 mmol/L (ref 22–32)
Calcium: 8.9 mg/dL (ref 8.9–10.3)
Chloride: 100 mmol/L (ref 98–111)
Creatinine, Ser: 2.21 mg/dL — ABNORMAL HIGH (ref 0.61–1.24)
GFR, Estimated: 28 mL/min — ABNORMAL LOW (ref >60)
Glucose, Bld: 124 mg/dL — ABNORMAL HIGH (ref 70–99)
Potassium: 3.9 mmol/L (ref 3.5–5.1)
Sodium: 142 mmol/L (ref 135–145)

## 2024-01-10 LAB — PHOSPHORUS: Phosphorus: 3.4 mg/dL (ref 2.5–4.6)

## 2024-01-10 LAB — MAGNESIUM: Magnesium: 2.1 mg/dL (ref 1.7–2.4)

## 2024-01-10 MED ORDER — NEPRO/CARBSTEADY PO LIQD
237.0000 mL | Freq: Three times a day (TID) | ORAL | Status: DC
  Administered 2024-01-10: 237 mL via ORAL

## 2024-01-10 MED ORDER — VITAMIN C 500 MG PO TABS
500.0000 mg | ORAL_TABLET | Freq: Two times a day (BID) | ORAL | Status: DC
  Administered 2024-01-10: 500 mg via ORAL
  Filled 2024-01-10: qty 1

## 2024-01-10 MED ORDER — OSMOLITE 1.5 CAL PO LIQD
1000.0000 mL | ORAL | Status: DC
Start: 2024-01-10 — End: 2024-01-10

## 2024-01-10 MED ORDER — FREE WATER: 100.0000 mL | Status: DC

## 2024-01-10 MED ORDER — THIAMINE HCL 100 MG PO TABS
100.0000 mg | ORAL_TABLET | Freq: Every day | ORAL | Status: DC
  Filled 2024-01-10: qty 1

## 2024-01-10 MED ORDER — PROSOURCE PLUS PO LIQD
30.0000 mL | Freq: Two times a day (BID) | ORAL | Status: DC
  Administered 2024-01-10: 30 mL via ORAL
  Filled 2024-01-10 (×5): qty 30

## 2024-01-10 MED ORDER — ADULT MULTIVITAMIN W/MINERALS CH: 1.0000 | ORAL_TABLET | Freq: Every day | ORAL | Status: DC

## 2024-01-10 MED ORDER — OXYCODONE HCL 5 MG PO TABS
5.0000 mg | ORAL_TABLET | ORAL | Status: DC | PRN
  Administered 2024-01-10 (×2): 5 mg via ORAL
  Filled 2024-01-10 (×2): qty 1

## 2024-01-10 MED ORDER — SENNOSIDES-DOCUSATE SODIUM 8.6-50 MG PO TABS
1.0000 | ORAL_TABLET | Freq: Two times a day (BID) | ORAL | Status: DC
Start: 2024-01-10 — End: 2024-01-11
  Administered 2024-01-10 (×2): 1 via ORAL
  Filled 2024-01-10 (×2): qty 1

## 2024-01-10 MED ORDER — NEPRO/CARBSTEADY PO LIQD
237.0000 mL | Freq: Two times a day (BID) | ORAL | Status: DC
  Administered 2024-01-10: 237 mL via ORAL

## 2024-01-10 MED ORDER — PROSOURCE TF20 ENFIT COMPATIBL EN LIQD: 60.0000 mL | Freq: Every day | ENTERAL | Status: DC

## 2024-01-10 NOTE — Progress Notes (Signed)
 PROGRESS NOTE    Daniel Carr  FMW:[id number] DOB: 1935/11/20 DOA: 12/27/2023 PCP: Gasper Nancyann BRAVO, MD    Brief Narrative:  89 y.o. Male admitted with Acute Metabolic Encephalopathy, Acute Hypoxic Hypercapnic Respiratory Failure in the setting of Severe Sepsis with Septic Shock due to Enterococcus Faecalis BACTEREMIA due to UTI, transaminitis and AKI requiring intubation and mechanical ventilation, along with initiation of CRRT    This is an 89 yo male who presented to [facility] ER via EMS on 12/31 from [facility] with altered mental status and acute respiratory failure.  Pts family reports he has had worsening bilateral lower extremity edema and decreased urinary output.  His PCP increased his outpatient lasix  dose to 40 mg daily, however his urinary output did not improve.  Pts granddaughter reports he also c/o chills and fevers.  He was recently hospitalized at [facility] on 12/02/23 with a right humerus fracture from a mechanical fall on 12/3 and evaluated by ortho who recommended a sling for comfort and outpatient follow-up.  However, during presentation he was found to have progressive dyspnea; severe pulmonary hypertension; hyponatremia; acute blood loss anemia suspected secondary to right chest wall hematoma and acute kidney injury on chronic kidney disease requiring hospital admission.  Following treatment he was discharged on 12/17 to [facility] for rehab and instructed not to resume his outpatient apixaban  until 12/18.  His daughter reports the pts apixaban  has been resumed as instructed.     ED Course  Upon arrival to the ER pt in severe respiratory distress, hypotensive, and minimally responsive requiring mechanical intubation.  Significant lab results were: Na+ 125/K+ 5.9/chloride 86/BUN 72/creatinine 4.46/calcium 8.7/albumin  2.5/AST 143/ALT 89/troponin 550/lactic acid 2.4/wbc 15.9/hgb 11.2/PT 25.0/INR 2.2/APTT 48.  CXR concerning for possible small right pleural effusion.  He met sepsis  criteria and received: 2L saline bolus; vancomycin , and aztreonam .  Despite iv fluid resuscitation pt remained hypotensive requiring levophed  gtt.  PCCM team contacted for ICU  admission.   Significant Hospital Events 12/31: Pt admitted with acute hypercapnic respiratory failure, septic shock, AECOPD, pneumonia suspect aspiration, acute kidney injury, acute on chronic hyponatremia, transaminitis, and acute metabolic encephalopathy mechanical intubation  12/27/22- patient with improved mentation post CRRT.  Reducing his sedatives on MV today.  Sepsis bacteremia, ID on case.  He is oozing blood from IV sites, will order DIC panel and pathology slide review.   12/29/23- patient following verbal communication this am, remains in anasarca. Bacteremia being treated. He has CRRT with now removal of fluid at 100/hr.  Starting NG feeds.  Tracheal aspirate with GPCs in pairs.  ID on case continues with ampicillin  reducing steroids and off levophed  today. 12/30/23- patient for SBT today.  Last day of steroids today. Failed SBT today.  S/p iD evaluation 12/31/23-patient liberated from MV today on BIPAP, required precedex  for aggitation. Remain on ampicillin . Met with daughter and granddaughter few times to review findings and medical plan.  01/01/24- patient improved overnight, weaned of bipap on hfnc, remains edematous but improved on CRRT with 100/h UF, met with family reviewed medical plan, ordering PT/OT for tommorow. Remains on [place] heparin  with CTEPH/chronic DVTs. CRRT stopped due to clotting filter. 01/02/24- Off BiPAP and Precedex , respiratory and mental status improving.  Vasopressors weaned off.  Creatinine increased, Urine output improved, Nephrology to decide on whether to resume CRRT vs trial intermittent HD vs observation.  Resume outpatient Eliquis  at reduced dose (2.5 mg BID) for chronic bilateral LE DVT's.  1/7: Transferred to [facility] hospitalist service   Assessment &  Plan:   Principal Problem:   Acute  hypoxemic respiratory failure (HCC) Active Problems:   Bacteremia due to Enterococcus   AKI (acute kidney injury) (HCC)   Complicated UTI (urinary tract infection)   Malnutrition of moderate degree # Septic Shock ~ RESOLVED # Acute on Chronic HFpEF # Elevated Troponin, suspect demand ischemia  # CTEPH PMHx: HTN, aortic stenosis, PE, HLD Echocardiogram 12/28/23: LVEF 60-65%, indeterminate diastolic parameters, moderate Aortic stenosis Shock physiology has resolved Plan: Continue midodrine  10 mg 3 times daily, wean if tolerated MAP goal 65   Moderate protein calorie malnutrition Patient is not meeting his nutritional goals.  If we are still pursuing aggressive care he may need an enteral feeding tube.  I had a lengthy discussion with patient and daughter at bedside on 1/14.  I explained that he is unlikely to meet his nutritional goals without placement of enteral feeding tube.  This feeding tube would not be a long-term solution and I would only recommend a 5-day maximum course.  The patient does not seem especially participative in interview however daughter is interested in placement.  After lengthy discussion with her own daughters decision made to defer feeding tube at this point.  I have great concerns about this patient's functional status and his recovery potential.  Given his lethargy and lack of willingness to participate in physical therapy and to consume enough calories to sustain his nutritional needs he is at very high risk for further deterioration and clinical decompensation.   # Severe Sepsis  # Enterococcus Faecalis BACTEREMIA # Enterococcus Faecalis UTI # Questionable Pneumonia Sepsis physiology resolved Continue antibiotics for 2-week total.   End date 1/14   # Acute Hypoxic & Hypercapnic Respiratory Failure # AECOPD -EXTUBATED 1/4 -Supplemental O2 as needed to maintain O2 sats 88 to 92% -BiPAP qhs    # Acute kidney injury suspect secondary to ATN and diuretic  therapy  # Hyperkalemia ~ RESOLVED # Acute on chronic hyponatremia ~ RESOLVED # Lactic acidosis, Resolved  Patient still with peripheral edema Weaned off Lasix  gtt. Serum creatinine started to rise on 1/14 Plan: Oral Lasix  on hold Every 6 hours albumin  for 3 days for fluid mobilization Continue Foley catheter for accurate urine output measurement due to critical illness Nephrology signed off     # Previous Right Humerus Fracture # Chronic Bilateral Lower Extremity DVT's Continue Eliquis  5 mg p.o. twice daily   # Anemia of chronic disease Iron  deficiency, transferrin saturation 12% Continue oral iron  supplement -Monitor for S/Sx of bleeding -Trend CBC -Resume home Eliquis  for AC/VTE Prophylaxis  -Transfuse for Hgb <7   # Hyperglycemia, Resolved  -CBG's ac & qhs; Target range of 140 to 180    # Hypothyroidism: Continue Synthroid    # Acute Metabolic Encephalopathy: resolved  CT Head on 12/31 negative for acute intracranial process -Treatment of metabolic derangements as outlined above -Provide supportive care -Promote normal sleep/wake cycle and family presence -Avoid sedating medications as able   # Chronic urinary retention Patient failed voiding trial, Foley catheter was reinserted on 01/03/2024 Started Flomax  Follow voiding trial when patient is ambulatory, continue Foley catheter in the meantime   # Vitamin D  insufficiency: started vitamin D  50,000 units p.o. weekly, follow with PCP to repeat vitamin D  level after 3 to 6 months.  Intractable pain Functional decline Patient's mobility efforts appear to be limited by pain.  I do lengthy discussion with patient and daughter at bedside on 1/10.  Encouraged him to take the pain medication in  order to promote further mobility efforts      DVT prophylaxis: Eliquis  Code Status: Full Family Communication: Daughter at bedside 1/10, 1/12, 1/13, 1/14 Disposition Plan: Status is: Inpatient Remains inpatient appropriate  because: Multiple acute issues as above   Level of care: Progressive  Consultants:  Nephrology  Procedures:  None  Antimicrobials: None   Subjective: Seen and examined.  Appears worse today.  Very poor oral intake.  Very poor engagement in interview.  Objective: Vitals:   01/10/24 0946 01/10/24 1100 01/10/24 1200 01/10/24 1300  BP: (!) 95/58 (!) 96/59 110/65 96/60  Pulse: (!) 103 (!) 102 (!) 102 98  Resp: (!) 30 (!) 41 (!) 34 (!) 24  Temp: 98.6 F (37 C)     TempSrc:      SpO2: 98% 99% 97% 100%  Weight:      Height:        Intake/Output Summary (Last 24 hours) at 01/10/2024 1413 Last data filed at 01/10/2024 1052 Gross per 24 hour  Intake 1065.71 ml  Output 575 ml  Net 490.71 ml   Filed Weights   01/08/24 0705 01/09/24 0500 01/10/24 0502  Weight: 92.4 kg 94.9 kg 90.8 kg    Examination:  General exam: Very weak and fatigued. Respiratory system: Poor respiratory effort.  Coarse breath sounds.  Tachypneic.  2 L Cardiovascular system: S1 S2, RRR, no murmurs, 3+ pitting edema BLE Gastrointestinal system: Soft, NT/ND, normal bowel sounds Central nervous system: Alert and oriented. No focal neurological deficits. Extremities: Markedly decreased power bilateral upper and lower extremities Skin: No rashes, lesions or ulcers Psychiatry: Judgement and insight appear normal. Mood & affect flattened.     Data Reviewed: I have personally reviewed following labs and imaging studies  CBC: Recent Labs  Lab 01/04/24 0514 01/05/24 0623 01/06/24 0621 01/07/24 0419 01/08/24 0430  WBC 8.2 6.5 6.0 6.5 6.1  HGB 8.5* 9.3* 7.9* 8.2* 8.2*  HCT 27.0* 28.2* 25.5* 25.0* 26.5*  MCV 102.7* 100.4* 100.0 102.5* 100.4*  PLT 231 132* 220 241 225   Basic Metabolic Panel: Recent Labs  Lab 01/04/24 0514 01/05/24 0623 01/06/24 0621 01/07/24 0419 01/08/24 0430 01/10/24 0548  NA 144 137 141 141 142 142  K 4.6 4.1 3.8 3.6 3.5 3.9  CL 107 100 102 100 97* 100  CO2 26 27 30  33*  35* 32  GLUCOSE 104* 90 97 105* 93 124*  BUN 48* 44* 40* 37* 37* 41*  CREATININE 1.77* 1.77* 1.81* 1.87* 1.85* 2.21*  CALCIUM 9.0 8.6* 8.5* 8.5* 8.8* 8.9  MG 2.3 2.2 2.0  --   --  2.1  PHOS 3.1 3.8 3.0  --   --  3.4   GFR: Estimated Creatinine Clearance: 27.1 mL/min (A) (by C-G formula based on SCr of 2.21 mg/dL (H)). Liver Function Tests: No results for input(s): AST, ALT, ALKPHOS, BILITOT, PROT, ALBUMIN  in the last 168 hours.  No results for input(s): LIPASE, AMYLASE in the last 168 hours. No results for input(s): AMMONIA in the last 168 hours. Coagulation Profile: No results for input(s): INR, PROTIME in the last 168 hours. Cardiac Enzymes: No results for input(s): CKTOTAL, CKMB, CKMBINDEX, TROPONINI in the last 168 hours. BNP (last 3 results) No results for input(s): PROBNP in the last 8760 hours. HbA1C: No results for input(s): HGBA1C in the last 72 hours. CBG: Recent Labs  Lab 01/05/24 0749 01/08/24 2025 01/09/24 0019 01/09/24 0724 01/09/24 1135  GLUCAP 86 125* 113* 98 126*   Lipid Profile: No results for  input(s): CHOL, HDL, LDLCALC, TRIG, CHOLHDL, LDLDIRECT in the last 72 hours. Thyroid  Function Tests: No results for input(s): TSH, T4TOTAL, FREET4, T3FREE, THYROIDAB in the last 72 hours. Anemia Panel: No results for input(s): VITAMINB12, FOLATE, FERRITIN, TIBC, IRON , RETICCTPCT in the last 72 hours. Sepsis Labs: No results for input(s): PROCALCITON, LATICACIDVEN in the last 168 hours.  Recent Results (from the past 240 hours)  Culture, blood (Routine X 2) w Reflex to ID Panel     Status: None   Collection Time: 01/02/24  2:58 PM   Specimen: BLOOD  Result Value Ref Range Status   Specimen Description BLOOD BLOOD LEFT HAND  Final   Special Requests   Final    BOTTLES DRAWN AEROBIC AND ANAEROBIC Blood Culture results may not be optimal due to an inadequate volume of blood received in culture  bottles   Culture   Final    NO GROWTH 5 DAYS Performed at [facility], [address], [place], [place] [address]    Report Status 01/07/2024 FINAL  Final  Culture, blood (Routine X 2) w Reflex to ID Panel     Status: None   Collection Time: 01/02/24  3:06 PM   Specimen: BLOOD  Result Value Ref Range Status   Specimen Description BLOOD BLOOD LEFT WRIST  Final   Special Requests   Final    BOTTLES DRAWN AEROBIC AND ANAEROBIC Blood Culture adequate volume   Culture   Final    NO GROWTH 5 DAYS Performed at [facility], [address], [place], [place] [address]    Report Status 01/07/2024 FINAL  Final         Radiology Studies: No results found.      Scheduled Meds:  (feeding supplement) PROSource Plus  30 mL Oral BID BM   alteplase   2 mg Intracatheter Once   apixaban   5 mg Oral BID   vitamin C   500 mg Oral BID   brimonidine   1 drop Both Eyes TID   Chlorhexidine  Gluconate Cloth  6 each Topical Daily   diclofenac  Sodium  2 g Topical BID   dorzolamide   1 drop Left Eye TID   doxazosin   1 mg Oral Daily   DULoxetine   30 mg Oral Daily   feeding supplement (NEPRO CARB STEADY)  237 mL Oral TID BM   iron  polysaccharides  150 mg Oral Daily   latanoprost   1 drop Both Eyes QHS   levothyroxine   150 mcg Oral Q0600   lidocaine   1 patch Transdermal Q24H   midodrine   10 mg Oral TID WC   mirtazapine   15 mg Oral QHS   [START ON 01/11/2024] multivitamin with minerals  1 tablet Oral Daily   mouth rinse  15 mL Mouth Rinse 4 times per day   senna-docusate  1 tablet Oral BID   [START ON 01/11/2024] thiamine   100 mg Per Tube Daily   Vitamin D  (Ergocalciferol )  50,000 Units Oral Q7 days   Continuous Infusions:  albumin  human 12.5 g (01/10/24 0932)   ampicillin  (OMNIPEN) IV 2 g (01/10/24 1046)     LOS: 14 days    Calvin KATHEE Robson, MD [facility]   If 7PM-7AM, please contact night-coverage  01/10/2024, 2:13 PM

## 2024-01-10 NOTE — Progress Notes (Signed)
 Physical Therapy Treatment Patient Details Name: Daniel Carr MRN: [id number] DOB: 1935-03-30 Today's Date: 01/10/2024   History of Present Illness 89 y.o. Male admitted with Acute Metabolic Encephalopathy, Acute Hypoxic Hypercapnic Respiratory Failure in the setting of Severe Sepsis with Septic Shock due to Enterococcus Faecalis BACTEREMIA due to UTI, transaminitis and AKI requiring intubation and mechanical ventilation, along with initiation of CRRT.  Pt had fall with prox R humerus fx 12/3, was hospitalized at [facility], d/c to [facility]) 12/17.    PT Comments  Patient seen for co-treatment with OT to maximize outcomes. Patient is lethargic throughout session with poor participation. He keeps eyes closed most of the time and needs maximal encouragement for participation. Total A +2 person required for bed mobility. Sitting balance is poor with maximal assistance required initially to maintain sitting balance. Unable to attempt standing due to poor participation and generalized weakness. The patient appears anxious with any mobility efforts with generalized pain and right arm discomfort. Discussed goals of rehabilitation with the daughter. She is hopeful patient will be strong enough for a pivot transfer eventually. PT will continue to follow to maximize independence and decrease caregiver burden as patient is able to participate.    If plan is discharge home, recommend the following: Two people to help with walking and/or transfers;Two people to help with bathing/dressing/bathroom;Assistance with cooking/housework;Assistance with feeding;Assist for transportation   Can travel by private vehicle     No  Equipment Recommendations   (to be determined at next level of care)    Recommendations for Other Services       Precautions / Restrictions Precautions Precautions: Fall Restrictions Weight Bearing Restrictions Per Provider Order:  (no formal orders) RUE Weight Bearing Per Provider  Order: Non weight bearing Other Position/Activity Restrictions: sling in place RUE     Mobility  Bed Mobility Overal bed mobility: Needs Assistance Bed Mobility: Supine to Sit, Sit to Supine     Supine to sit: Total assist, +2 for physical assistance Sit to supine: Total assist, +2 for physical assistance   General bed mobility comments: maximal cues for technique and for participation. limited active participation provided by the patient despite cues    Transfers                   General transfer comment: unsafe to attempt standing, poor sitting tolerance and poor participation overall with seated level activity    Ambulation/Gait                   Stairs             Wheelchair Mobility     Tilt Bed    Modified Rankin (Stroke Patients Only)       Balance Overall balance assessment: Needs assistance Sitting-balance support: Feet supported, No upper extremity supported Sitting balance-Leahy Scale: Zero Sitting balance - Comments: maximal assistance initially required with preiods of Min A. patient leans to the left side and posteriorly despite facilitation and cues for midline. keeps eyes closed unless prompted to open. poor participation overall                                    Cognition Arousal: Lethargic Behavior During Therapy: Flat affect Overall Cognitive Status: Impaired/Different from baseline                         Following Commands:  Follows one step commands inconsistently Safety/Judgement: Decreased awareness of deficits   Problem Solving: Slow processing, Decreased initiation, Difficulty sequencing, Requires verbal cues, Requires tactile cues General Comments: patient keeps eyes closed frequently. he appears anxious at times with mobility efforts. cues to maintain alertness and for participation        Exercises      General Comments General comments (skin integrity, edema, etc.): respiration  rate does increase into the 40's with mobility. other vitals stable with activity      Pertinent Vitals/Pain Pain Assessment Pain Assessment: Faces Faces Pain Scale: Hurts whole lot Pain Location: generalized pain with movement, but mostly in the RUE Pain Descriptors / Indicators: Discomfort, Grimacing, Guarding, Moaning Pain Intervention(s): Limited activity within patient's tolerance, Monitored during session, Premedicated before session, Repositioned    Home Living                          Prior Function            PT Goals (current goals can now be found in the care plan section) Acute Rehab PT Goals Patient Stated Goal: patient does not state a goal. the daughter's goal is to be strong enough for a pivot transfer PT Goal Formulation: With family Time For Goal Achievement: 01/16/24 Potential to Achieve Goals: Poor Progress towards PT goals: Progressing toward goals    Frequency    Min 1X/week      PT Plan      Co-evaluation PT/OT/SLP Co-Evaluation/Treatment: Yes Reason for Co-Treatment: Complexity of the patient's impairments (multi-system involvement);To address functional/ADL transfers PT goals addressed during session: Mobility/safety with mobility;Balance OT goals addressed during session: ADL's and self-care      AM-PAC PT 6 Clicks Mobility   Outcome Measure  Help needed turning from your back to your side while in a flat bed without using bedrails?: Total Help needed moving from lying on your back to sitting on the side of a flat bed without using bedrails?: Total Help needed moving to and from a bed to a chair (including a wheelchair)?: Total Help needed standing up from a chair using your arms (e.g., wheelchair or bedside chair)?: Total Help needed to walk in hospital room?: Total Help needed climbing 3-5 steps with a railing? : Total 6 Click Score: 6    End of Session Equipment Utilized During Treatment: Oxygen  Activity Tolerance:  Patient limited by fatigue;Patient limited by lethargy;Patient limited by pain Patient left: in bed;with call bell/phone within reach;with bed alarm set;with family/visitor present (daughter at bedside throughout session) Nurse Communication: Mobility status PT Visit Diagnosis: Muscle weakness (generalized) (M62.81);Difficulty in walking, not elsewhere classified (R26.2);Pain;Unsteadiness on feet (R26.81) Pain - Right/Left: Right Pain - part of body: Shoulder     Time: 8876-8845 PT Time Calculation (min) (ACUTE ONLY): 31 min  Charges:    $Therapeutic Activity: 8-22 mins PT General Charges $$ ACUTE PT VISIT: 1 Visit                     Daniel Carr, PT, MPT    Daniel Carr 01/10/2024, 12:49 PM

## 2024-01-10 NOTE — Progress Notes (Signed)
 Occupational Therapy Treatment Patient Details Name: Daniel Carr MRN: [id number] DOB: 19-Apr-1935 Today's Date: 01/10/2024   History of present illness 89 y.o. Male admitted with Acute Metabolic Encephalopathy, Acute Hypoxic Hypercapnic Respiratory Failure in the setting of Severe Sepsis with Septic Shock due to Enterococcus Faecalis BACTEREMIA due to UTI, transaminitis and AKI requiring intubation and mechanical ventilation, along with initiation of CRRT.  Pt had fall with prox R humerus fx 12/3, was hospitalized at [facility], d/c to [facility] [employer]) 12/17.   OT comments  Chart reviewed, nurse cleared pt for participation in OT tx session targeting improving activity tolerance in prep for ADL tasks. Co tx completed with PT. Pt is lethargic, opening eyes to cueing but then instantly closing them. Poor sustained attention. Pt is grimacing throughout with mobility. Overall, pt required TOTAL A +2 for bed mobility, MAX A +2 for sustained static sitting with L and posterior lean throughout. MAX A for grooming tasks, TOTAL A for LB dressing. Pt is left as received, all needs met. Nurse notified of pt status. OT will continue to follow.       If plan is discharge home, recommend the following:  Two people to help with walking and/or transfers;Two people to help with bathing/dressing/bathroom;Direct supervision/assist for medications management;Assist for transportation;Supervision due to cognitive status;Assistance with cooking/housework;Help with stairs or ramp for entrance;Direct supervision/assist for financial management;Assistance with feeding   Equipment Recommendations  Other (comment) (defer)    Recommendations for Other Services      Precautions / Restrictions Precautions Precautions: Fall Restrictions Weight Bearing Restrictions Per Provider Order:  (no formal orders) RUE Weight Bearing Per Provider Order: Non weight bearing Other Position/Activity Restrictions: sling in place RUE        Mobility Bed Mobility Overal bed mobility: Needs Assistance Bed Mobility: Supine to Sit, Sit to Supine     Supine to sit: Total assist, +2 for physical assistance Sit to supine: Total assist, +2 for physical assistance   General bed mobility comments: step by step multi modal cues for participation    Transfers                   General transfer comment: unable to safely attempt or tolerate on this date     Balance Overall balance assessment: Needs assistance Sitting-balance support: Feet supported, No upper extremity supported Sitting balance-Leahy Scale: Zero Sitting balance - Comments: MAX A for static sitting on edge of bed, L and posterior lean despite step by step multi modal cues for engagement Postural control: Left lateral lean, Posterior lean                                 ADL either performed or assessed with clinical judgement   ADL Overall ADL's : Needs assistance/impaired     Grooming: Maximal assistance;Wash/dry face;Sitting           Upper Body Dressing : Maximal assistance;Sitting Upper Body Dressing Details (indicate cue type and reason): adjust sling Lower Body Dressing: Total assistance donn/doff socks                  General ADL Comments: continues to be limited by fatigue    Extremity/Trunk Assessment Upper Extremity Assessment Upper Extremity Assessment: RUE deficits/detail;Right hand dominant RUE Deficits / Details: RUE in sling/immobilizer able to wiggle fingers, edema noted throughout            Vision Baseline Vision/History: 1 Wears glasses  Perception     Praxis      Cognition Arousal: Lethargic   Overall Cognitive Status: Impaired/Different from baseline Area of Impairment: Attention, Following commands, Safety/judgement, Awareness, Problem solving, Memory, Orientation                 Orientation Level: Disoriented to, Place, Time, Situation Current Attention Level:  Focused Memory: Decreased short-term memory Following Commands: Follows one step commands inconsistently Safety/Judgement: Decreased awareness of deficits Awareness: Intellectual Problem Solving: Slow processing, Decreased initiation, Difficulty sequencing, Requires verbal cues, Requires tactile cues General Comments: eyes closed even when glasses doffed, appears anxious with mobility, step by step multi modals for sustained attention        Exercises Other Exercises Other Exercises: edu pt/daughter re: positioning of RUE for optimal support    Shoulder Instructions       General Comments RR to 40s with mobility, all others remain stable throughout with activity; Edema noted throughout extremities, positioned for decreased dependent edema    Pertinent Vitals/ Pain       Pain Assessment Pain Assessment: Faces Faces Pain Scale: Hurts whole lot Pain Location: generalized, mostly with RUE Pain Descriptors / Indicators: Discomfort, Grimacing, Guarding, Moaning Pain Intervention(s): Monitored during session, Limited activity within patient's tolerance, Repositioned, Premedicated before session  Home Living                                          Prior Functioning/Environment              Frequency  Min 1X/week        Progress Toward Goals  OT Goals(current goals can now be found in the care plan section)  Progress towards OT goals: Progressing toward goals  Acute Rehab OT Goals Time For Goal Achievement: 01/17/24  Plan      Co-evaluation    PT/OT/SLP Co-Evaluation/Treatment: Yes Reason for Co-Treatment: Complexity of the patient's impairments (multi-system involvement);To address functional/ADL transfers PT goals addressed during session: Mobility/safety with mobility;Balance OT goals addressed during session: ADL's and self-care      AM-PAC OT 6 Clicks Daily Activity     Outcome Measure   Help from another person eating meals?: A  Lot Help from another person taking care of personal grooming?: A Lot Help from another person toileting, which includes using toliet, bedpan, or urinal?: Total Help from another person bathing (including washing, rinsing, drying)?: A Lot Help from another person to put on and taking off regular upper body clothing?: A Lot Help from another person to put on and taking off regular lower body clothing?: Total 6 Click Score: 10    End of Session Equipment Utilized During Treatment: Oxygen   OT Visit Diagnosis: Other abnormalities of gait and mobility (R26.89);Muscle weakness (generalized) (M62.81)   Activity Tolerance Patient limited by fatigue   Patient Left in bed;with call bell/phone within reach;with family/visitor present;with bed alarm set   Nurse Communication Mobility status (RUE positioning)        Time: 8876-8845 OT Time Calculation (min): 31 min  Charges: OT General Charges $OT Visit: 1 Visit OT Treatments $Therapeutic Activity: 8-22 mins  Therisa Sheffield, OTD OTR/L  01/10/24, 2:16 PM

## 2024-01-10 NOTE — TOC Progression Note (Signed)
 Transition of Care [facility]) - Progression Note    Patient Details  Name: Daniel Carr MRN: [id number] Date of Birth: [birth year]  [facility] CM/SW Contact  Lauraine [name], LCSW Phone Number: 01/10/2024, 1:33 PM  Clinical Narrative:   CSW continues to follow progress. Will need to get prior authorization before he can discharge to SNF.  Expected Discharge Plan and Services                                               Social Determinants of Health (SDOH) Interventions SDOH Screenings   Food Insecurity: Patient Unable To Answer (12/28/2023)  Housing: Patient Unable To Answer (12/28/2023)  Transportation Needs: Patient Unable To Answer (12/28/2023)  Utilities: Patient Unable To Answer (12/28/2023)  Alcohol  Screen: Low Risk  (09/07/2023)  Depression (PHQ2-9): Low Risk  (09/07/2023)  Recent Concern: Depression (PHQ2-9) - Medium Risk (08/12/2023)  Financial Resource Strain: Low Risk  (11/22/2023)   Received from [facility]  Physical Activity: Inactive (09/25/2023)  Social Connections: Unknown (12/28/2023)  Stress: No Stress Concern Present (09/25/2023)  Tobacco Use: Medium Risk (12/12/2023)   Received from [facility]    Readmission Risk Interventions     No data to display

## 2024-01-10 NOTE — Care Management Important Message (Signed)
 Important Message  Patient Details  Name: Daniel Carr MRN: [id number] Date of Birth: [birth year]   Important Message Given:        Sherilyn Banker 01/10/2024, 11:30 AM

## 2024-01-10 NOTE — Progress Notes (Addendum)
 Nutrition Follow-up  DOCUMENTATION CODES:   Non-severe (moderate) malnutrition in context of chronic illness  INTERVENTION:   If feeding tube placed, recommend:  Osmolite 1.5@65ml /hr- Initiate at 32ml/hr and increase by 10ml/hr q8 hours until goal rate is reached.   ProSource TF 20- Give 60ml daily via tube, each supplement provides 80kcal and 20g of protein.   Free water  flushes 100ml q4 hours   Regimen provides 2420kcal/day, 118g/day protein and 1713ml/day of free water .   Nepro Shake po TID, each supplement provides 425 kcal and 19 grams protein  Magic cup TID with meals, each supplement provides 290 kcal and 9 grams of protein  Pro-Source Plus 30ml PO BID- Each supplement provides 100kcal and 15g protein   MVI po daily   Thiamine  100mg  daily via tube x 7 days   Vitamin C  500mg  BID via tube   Pt at high refeed risk; recommend monitor potassium, magnesium  and phosphorus labs daily until stable  Daily weights   NUTRITION DIAGNOSIS:   Moderate Malnutrition related to chronic illness as evidenced by moderate fat depletion, moderate muscle depletion. -ongoing   GOAL:   Patient will meet greater than or equal to 90% of their needs -progressing   MONITOR:   PO intake, Supplement acceptance, Labs, Weight trends, I & O's, Skin  ASSESSMENT:   89 y.o. male with h/o HLD, HTN, hypothyroidism, COPD, glaucoma, BPH, MGUS, PE (2022), CHF, aortic stenosis and recent humerus fracture s/p mechanical fall (12/3) who is admitted with PNA, sepsis, AKI, anemia and shock.  Family has decided to proceed with NGT placement and nutrition support. Pt is at high refeed risk. No BM since 1/9; bowel regimen initiated. Per chart, pt is down ~37lbs since admission and is now down ~5lbs from his UBW.   Addendum: Family is no longer wanting a feeding tube. RD will add back supplements. Recommendations are above if feeding tube is placed.   Medications reviewed and include: vitamin C , iron ,  synthroid , midodrine , remeron , MVI, senokot, vitamin D , albumin , omnipen  Labs reviewed: K 3.9 wnl, BUN 41(H), creat 2.21(H), P 3.4 wnl, Mg 2.1 wnl Hgb 8.2(L), Hct 26.5(L)- 1/12 Cbgs- 126, 98, 113 x 48 hrs  Diet Order:   Diet Order             DIET DYS 3 Room service appropriate? Yes; Fluid consistency: Thin; Fluid restriction: 1500 mL Fluid  Diet effective now                  EDUCATION NEEDS:   Education needs have been addressed  Skin:  Skin Assessment: Reviewed RN Assessment (wound R buttocks)  Last BM:  1/9- type 6  Height:   Ht Readings from Last 1 Encounters:  12/29/23 6' 0.99 (1.854 m)    Weight:   Wt Readings from Last 1 Encounters:  01/10/24 90.8 kg    Ideal Body Weight:  83.6 kg  BMI:  Body mass index is 26.42 kg/m.  Estimated Nutritional Needs:   Kcal:  2200-2500kcal/day  Protein:  110-125g/day  Fluid:  2.1-2.4L/day  Augustin Shams MS, RD, LDN If unable to be reached, please send secure chat to RD inpatient available from 8:00a-4:00p daily

## 2024-01-11 ENCOUNTER — Inpatient Hospital Stay: Payer: Medicare HMO

## 2024-01-11 DIAGNOSIS — J9601 Acute respiratory failure with hypoxia: Secondary | ICD-10-CM | POA: Diagnosis not present

## 2024-01-11 LAB — CBC
HCT: 26.5 % — ABNORMAL LOW (ref 39.0–52.0)
Hemoglobin: 7.6 g/dL — ABNORMAL LOW (ref 13.0–17.0)
MCH: 31.1 pg (ref 26.0–34.0)
MCHC: 28.7 g/dL — ABNORMAL LOW (ref 30.0–36.0)
MCV: 108.6 fL — ABNORMAL HIGH (ref 80.0–100.0)
Platelets: 161 10*3/uL (ref 150–400)
RBC: 2.44 MIL/uL — ABNORMAL LOW (ref 4.22–5.81)
RDW: 15.2 % (ref 11.5–15.5)
WBC: 3.9 10*3/uL — ABNORMAL LOW (ref 4.0–10.5)
nRBC: 0 % (ref 0.0–0.2)

## 2024-01-11 LAB — BLOOD GAS, ARTERIAL
Acid-Base Excess: 7.8 mmol/L — ABNORMAL HIGH (ref 0.0–2.0)
Acid-Base Excess: 10.8 mmol/L — ABNORMAL HIGH (ref 0.0–2.0)
Acid-Base Excess: 12.7 mmol/L — ABNORMAL HIGH (ref 0.0–2.0)
Bicarbonate: 39.9 mmol/L — ABNORMAL HIGH (ref 20.0–28.0)
Bicarbonate: 38.6 mmol/L — ABNORMAL HIGH (ref 20.0–28.0)
Bicarbonate: 39.4 mmol/L — ABNORMAL HIGH (ref 20.0–28.0)
Delivery systems: POSITIVE
Delivery systems: POSITIVE
Expiratory PAP: 2 cm[H2O]
Expiratory PAP: 8 cm[H2O]
FIO2: 30 %
Inspiratory PAP: 20 cm[H2O]
Inspiratory PAP: 20 cm[H2O]
Mechanical Rate: 25
O2 Content: 4 L/min
O2 Saturation: 85.2 %
O2 Saturation: 95.4 %
O2 Saturation: 64.3 %
Patient temperature: 37
Patient temperature: 37
Patient temperature: 37
pCO2 arterial: 102 mm[Hg] (ref 32–48)
pCO2 arterial: 75 mm[Hg] (ref 32–48)
pCO2 arterial: 58 mm[Hg] — ABNORMAL HIGH (ref 32–48)
pH, Arterial: 7.2 — ABNORMAL LOW (ref 7.35–7.45)
pH, Arterial: 7.32 — ABNORMAL LOW (ref 7.35–7.45)
pH, Arterial: 7.44 (ref 7.35–7.45)
pO2, Arterial: 53 mm[Hg] — ABNORMAL LOW (ref 83–108)
pO2, Arterial: 65 mm[Hg] — ABNORMAL LOW (ref 83–108)
pO2, Arterial: 34 mm[Hg] — CL (ref 83–108)

## 2024-01-11 LAB — BLOOD GAS, VENOUS
Acid-Base Excess: 14.9 mmol/L — ABNORMAL HIGH (ref 0.0–2.0)
Bicarbonate: 40.5 mmol/L — ABNORMAL HIGH (ref 20.0–28.0)
O2 Saturation: 82 %
Patient temperature: 37
pCO2, Ven: 57 mm[Hg] (ref 44–60)
pH, Ven: 7.46 — ABNORMAL HIGH (ref 7.25–7.43)
pO2, Ven: 44 mm[Hg] (ref 32–45)

## 2024-01-11 LAB — BASIC METABOLIC PANEL
Anion gap: 9 (ref 5–15)
Anion gap: 15 (ref 5–15)
BUN: 53 mg/dL — ABNORMAL HIGH (ref 8–23)
BUN: 57 mg/dL — ABNORMAL HIGH (ref 8–23)
CO2: 35 mmol/L — ABNORMAL HIGH (ref 22–32)
CO2: 28 mmol/L (ref 22–32)
Calcium: 9.2 mg/dL (ref 8.9–10.3)
Calcium: 9 mg/dL (ref 8.9–10.3)
Chloride: 103 mmol/L (ref 98–111)
Chloride: 101 mmol/L (ref 98–111)
Creatinine, Ser: 2.56 mg/dL — ABNORMAL HIGH (ref 0.61–1.24)
Creatinine, Ser: 2.8 mg/dL — ABNORMAL HIGH (ref 0.61–1.24)
GFR, Estimated: 24 mL/min — ABNORMAL LOW (ref >60)
GFR, Estimated: 21 mL/min — ABNORMAL LOW (ref >60)
Glucose, Bld: 124 mg/dL — ABNORMAL HIGH (ref 70–99)
Glucose, Bld: 87 mg/dL (ref 70–99)
Potassium: 4 mmol/L (ref 3.5–5.1)
Potassium: 4.5 mmol/L (ref 3.5–5.1)
Sodium: 147 mmol/L — ABNORMAL HIGH (ref 135–145)
Sodium: 144 mmol/L (ref 135–145)

## 2024-01-11 LAB — PHOSPHORUS: Phosphorus: 4.1 mg/dL (ref 2.5–4.6)

## 2024-01-11 LAB — PROCALCITONIN: Procalcitonin: 0.72 ng/mL

## 2024-01-11 LAB — TROPONIN I (HIGH SENSITIVITY): Troponin I (High Sensitivity): 81 ng/L — ABNORMAL HIGH (ref <18)

## 2024-01-11 LAB — BRAIN NATRIURETIC PEPTIDE: B Natriuretic Peptide: 1762.6 pg/mL — ABNORMAL HIGH (ref 0.0–100.0)

## 2024-01-11 LAB — MAGNESIUM: Magnesium: 2.4 mg/dL (ref 1.7–2.4)

## 2024-01-11 LAB — GLUCOSE, CAPILLARY: Glucose-Capillary: 110 mg/dL — ABNORMAL HIGH (ref 70–99)

## 2024-01-11 LAB — LACTIC ACID, PLASMA: Lactic Acid, Venous: 1.5 mmol/L (ref 0.5–1.9)

## 2024-01-11 MED ORDER — MORPHINE SULFATE (PF) 2 MG/ML IV SOLN
1.0000 mg | Freq: Once | INTRAVENOUS | Status: AC
  Administered 2024-01-11: 1 mg via INTRAVENOUS
  Filled 2024-01-11: qty 1

## 2024-01-11 MED ORDER — NOREPINEPHRINE 4 MG/250ML-% IV SOLN
2.0000 ug/min | INTRAVENOUS | Status: DC
  Filled 2024-01-11: qty 250

## 2024-01-11 MED ORDER — ACETAMINOPHEN 325 MG PO TABS: 650.0000 mg | ORAL_TABLET | Freq: Four times a day (QID) | ORAL | Status: DC | PRN

## 2024-01-11 MED ORDER — GLYCOPYRROLATE 0.2 MG/ML IJ SOLN: 0.2000 mg | INTRAMUSCULAR | Status: DC | PRN

## 2024-01-11 MED ORDER — LORAZEPAM 2 MG/ML IJ SOLN
2.0000 mg | INTRAMUSCULAR | Status: DC | PRN
  Administered 2024-01-11 – 2024-01-12 (×2): 2 mg via INTRAVENOUS
  Filled 2024-01-11 (×2): qty 1

## 2024-01-11 MED ORDER — GLYCOPYRROLATE 1 MG PO TABS: 1.0000 mg | ORAL_TABLET | ORAL | Status: DC | PRN

## 2024-01-11 MED ORDER — LORAZEPAM 2 MG/ML IJ SOLN
0.5000 mg | Freq: Four times a day (QID) | INTRAMUSCULAR | Status: DC | PRN
  Administered 2024-01-11: 0.5 mg via INTRAVENOUS
  Filled 2024-01-11: qty 1

## 2024-01-11 MED ORDER — SODIUM CHLORIDE 0.9 % IV SOLN: 250.0000 mL | INTRAVENOUS | Status: AC

## 2024-01-11 MED ORDER — ACETAMINOPHEN 650 MG RE SUPP: 650.0000 mg | Freq: Four times a day (QID) | RECTAL | Status: DC | PRN

## 2024-01-11 MED ORDER — FUROSEMIDE 10 MG/ML IJ SOLN
80.0000 mg | Freq: Once | INTRAMUSCULAR | Status: AC
  Administered 2024-01-11: 80 mg via INTRAVENOUS
  Filled 2024-01-11: qty 8

## 2024-01-11 MED ORDER — HALOPERIDOL LACTATE 5 MG/ML IJ SOLN
2.5000 mg | INTRAMUSCULAR | Status: DC | PRN
  Filled 2024-01-11: qty 1

## 2024-01-11 MED ORDER — POLYVINYL ALCOHOL 1.4 % OP SOLN: 1.0000 [drp] | Freq: Four times a day (QID) | OPHTHALMIC | Status: DC | PRN

## 2024-01-11 MED ORDER — MORPHINE SULFATE (PF) 2 MG/ML IV SOLN
2.0000 mg | INTRAVENOUS | Status: DC | PRN
Start: 2024-01-11 — End: 2024-01-12
  Administered 2024-01-12: 3 mg via INTRAVENOUS
  Administered 2024-01-12: 2 mg via INTRAVENOUS
  Administered 2024-01-12: 3 mg via INTRAVENOUS
  Administered 2024-01-12: 2 mg via INTRAVENOUS
  Filled 2024-01-11: qty 1
  Filled 2024-01-11 (×2): qty 2
  Filled 2024-01-11 (×2): qty 1

## 2024-01-11 NOTE — Progress Notes (Signed)
       CROSS COVER NOTE  NAME: Daniel Carr MRN: [id number] DOB : 06-18-1935 ATTENDING PHYSICIAN: Janeane Mealy, MD    Date of Service   01/11/2024   HPI/Events of Note   New onset fever. Was transferred back to ICU this am with somnolence and  hypercapnic resp failure.   Interventions   Assessment/Plan: Initially new labs ordered secondary to low white count on drop in hgb on previous am labs. However, when phlebotomy came to obtain labs, family refused and requested a referral to hospice tonight Bedside, patients daughter tells me that the family has decided they want to take him home on hospice as her mother is there as well  Code status discussed and family agreed to DNR with hospice set up in am , coordination asap. [facility] is preferred service. Comfort care measures only but continue on BIPAP tonight Transfer to 1C        Kip Peon NP [facility] Cross Cover 7pm-7am - check amion for availability Pager [phone]

## 2024-01-11 NOTE — Plan of Care (Signed)
  Problem: Education: Goal: Knowledge of General Education information will improve Description: Including pain rating scale, medication(s)/side effects and non-pharmacologic comfort measures Outcome: Progressing   Problem: Health Behavior/Discharge Planning: Goal: Ability to manage health-related needs will improve Outcome: Progressing   Problem: Pain Management: Goal: General experience of comfort will improve Outcome: Progressing   Problem: Safety: Goal: Ability to remain free from injury will improve Outcome: Progressing

## 2024-01-11 NOTE — Progress Notes (Signed)
 Patient transported from ICU to 1st floor on BIPAP with no issues.

## 2024-01-11 NOTE — Progress Notes (Signed)
 PROGRESS NOTE    Daniel Carr  OZH:[id number] DOB: Jun 21, 1935 DOA: 12/27/2023 PCP: Lamon Pillow, MD    Brief Narrative:  89 y.o. Male admitted with Acute Metabolic Encephalopathy, Acute Hypoxic Hypercapnic Respiratory Failure in the setting of Severe Sepsis with Septic Shock due to Enterococcus Faecalis BACTEREMIA due to UTI, transaminitis and AKI requiring intubation and mechanical ventilation, along with initiation of CRRT    This is an 89 yo male who presented to [facility] ER via EMS on 12/31 from [facility] with altered mental status and acute respiratory failure.  Pts family reports he has had worsening bilateral lower extremity edema and decreased urinary output.  His PCP increased his outpatient lasix  dose to 40 mg daily, however his urinary output did not improve.  Pts granddaughter reports he also c/o chills and fevers.  He was recently hospitalized at [facility] on 12/02/23 with a right humerus fracture from a mechanical fall on 12/3 and evaluated by ortho who recommended a sling for comfort and outpatient follow-up.  However, during presentation he was found to have progressive dyspnea; severe pulmonary hypertension; hyponatremia; acute blood loss anemia suspected secondary to right chest wall hematoma and acute kidney injury on chronic kidney disease requiring hospital admission.  Following treatment he was discharged on 12/17 to [facility] for rehab and instructed not to resume his outpatient apixaban  until 12/18.  His daughter reports the pts apixaban  has been resumed as instructed.     ED Course  Upon arrival to the ER pt in severe respiratory distress, hypotensive, and minimally responsive requiring mechanical intubation.  Significant lab results were: Na+ 125/K+ 5.9/chloride 86/BUN 72/creatinine 4.46/calcium 8.7/albumin  2.5/AST 143/ALT 89/troponin 550/lactic acid 2.4/wbc 15.9/hgb 11.2/PT 25.0/INR 2.2/APTT 48.  CXR concerning for possible small right pleural effusion.  He met sepsis  criteria and received: 2L saline bolus; vancomycin , and aztreonam .  Despite iv fluid resuscitation pt remained hypotensive requiring levophed  gtt.  PCCM team contacted for ICU  admission.   Significant Hospital Events 12/31: Pt admitted with acute hypercapnic respiratory failure, septic shock, AECOPD, pneumonia suspect aspiration, acute kidney injury, acute on chronic hyponatremia, transaminitis, and acute metabolic encephalopathy mechanical intubation  12/27/22- patient with improved mentation post CRRT.  Reducing his sedatives on MV today.  Sepsis bacteremia, ID on case.  He is oozing blood from IV sites, will order DIC panel and pathology slide review.   12/29/23- patient following verbal communication this am, remains in anasarca. Bacteremia being treated. He has CRRT with now removal of fluid at 100/hr.  Starting NG feeds.  Tracheal aspirate with GPCs in pairs.  ID on case continues with ampicillin  reducing steroids and off levophed  today. 12/30/23- patient for SBT today.  Last day of steroids today. Failed SBT today.  S/p iD evaluation 12/31/23-patient liberated from MV today on BIPAP, required precedex  for aggitation. Remain on ampicillin . Met with daughter and granddaughter few times to review findings and medical plan.  01/01/24- patient improved overnight, weaned of bipap on hfnc, remains edematous but improved on CRRT with 100/h UF, met with family reviewed medical plan, ordering PT/OT for tommorow. Remains on [place] heparin  with CTEPH/chronic DVTs. CRRT stopped due to clotting filter. 01/02/24- Off BiPAP and Precedex , respiratory and mental status improving.  Vasopressors weaned off.  Creatinine increased, Urine output improved, Nephrology to decide on whether to resume CRRT vs trial intermittent HD vs observation.  Resume outpatient Eliquis  at reduced dose (2.5 mg BID) for chronic bilateral LE DVT's.  1/7: Transferred to [facility] hospitalist service 1/15: transferred back  to icu for worsening encephalopathy,  hypercapnic respiratory failure   Assessment & Plan:   Principal Problem:   Acute hypoxemic respiratory failure (HCC) Active Problems:   Bacteremia due to Enterococcus   AKI (acute kidney injury) (HCC)   Complicated UTI (urinary tract infection)   Malnutrition of moderate degree # Septic Shock ~ RESOLVED # Acute on Chronic HFpEF # Elevated Troponin, suspect demand ischemia  # CTEPH PMHx: HTN, aortic stenosis, PE, HLD Echocardiogram 12/28/23: LVEF 60-65%, indeterminate diastolic parameters, moderate Aortic stenosis Shock physiology has resolved Plan: Continue midodrine  10 mg 3 times daily, wean if tolerated MAP goal 65 Diurese today with lasix  80 IV once   Moderate protein calorie malnutrition Patient is not meeting his nutritional goals.  If we are still pursuing aggressive care he may need an enteral feeding tube.  I had a lengthy discussion with patient and daughter at bedside on 1/14.  I explained that he is unlikely to meet his nutritional goals without placement of enteral feeding tube.  This feeding tube would not be a long-term solution and I would only recommend a 5-day maximum course.  The patient does not seem especially participative in interview however daughter is interested in placement.  After lengthy discussion with her own daughters decision made to defer feeding tube at this point.  I have great concerns about this patient's functional status and his recovery potential.  Given his lethargy and lack of willingness to participate in physical therapy and to consume enough calories to sustain his nutritional needs he is at very high risk for further deterioration and clinical decompensation.   # Severe Sepsis  # Enterococcus Faecalis BACTEREMIA # Enterococcus Faecalis UTI # Questionable Pneumonia Sepsis physiology resolved Continue antibiotics for 2-week total.  Abx ended 1/14   Acute Hypoxic & Hypercapnic Respiratory Failure AECOPD -EXTUBATED 1/4 - this morning  obtunded, hypoxic, hypercapnic - transfer to step-down - diurese as above - f/u cxr, concern for aspiration - trial of bipap, may need re-intubation    Acute kidney injury suspect secondary to ATN and diuretic therapy  Hyperkalemia ~ RESOLVED Lactic acidosis, Resolved  Patient still with peripheral edema Weaned off Lasix  gtt. Serum creatinine started to rise on 1/14 Plan: Given worsening respiratory status will give additional lasix  today but with up-trending bun and creatinine and sodium think more likely an oncotic pressure problem     Previous Right Humerus Fracture Chronic Bilateral Lower Extremity DVT's Continue Eliquis  5 mg p.o. twice daily   Anemia of chronic disease Iron  deficiency, transferrin saturation 12% Continue oral iron  supplement -Monitor for S/Sx of bleeding -Trend CBC -Resume home Eliquis  for AC/VTE Prophylaxis  -Transfuse for Hgb <7   Hyperglycemia, Resolved  -CBG's ac & qhs; Target range of 140 to 180  Hypothyroidism: Continue Synthroid    Acute Metabolic Encephalopathy:  CT Head on 12/31 negative for acute intracranial process -Treatment of metabolic derangements as outlined above -Provide supportive care -Promote normal sleep/wake cycle and family presence -Avoid sedating medications as able - treating respiratory failure as above   Chronic urinary retention Patient failed voiding trial, Foley catheter was reinserted on 01/03/2024 Started Flomax  Follow voiding trial when patient is ambulatory, continue Foley catheter in the meantime   Vitamin D  insufficiency: started vitamin D  50,000 units p.o. weekly, follow with PCP to repeat vitamin D  level after 3 to 6 months.  Intractable pain Functional decline Patient's mobility efforts appear to be limited by pain.  I do lengthy discussion with patient and daughter at bedside on  1/10.  Encouraged him to take the pain medication in order to promote further mobility efforts  End-of-life care Shared with  daughter today given active issues and underlying comorbidities my assessment is that the patient is dying and that meaningful recovery is unlikely. Daughter expresses desire to continue to do everything posible      DVT prophylaxis: Eliquis  Code Status: Full Family Communication: Daughter telephonically 1/15 Disposition Plan: Status is: Inpatient Remains inpatient appropriate because: Multiple acute issues as above   Level of care: Stepdown  Consultants:  Nephrology  Procedures:  None  Antimicrobials: None   Subjective: Seen and examined. Overnight more confusion, minimally responsive  Objective: Vitals:   01/11/24 0300 01/11/24 0400 01/11/24 0500 01/11/24 0700  BP:  111/60  107/66  Pulse:  100  96  Resp:  (!) 25  20  Temp: 97.8 F (36.6 C)   97.9 F (36.6 C)  TempSrc: Axillary   Axillary  SpO2:  99%  99%  Weight:   92.3 kg   Height:        Intake/Output Summary (Last 24 hours) at 01/11/2024 0812 Last data filed at 01/11/2024 0600 Gross per 24 hour  Intake 946.24 ml  Output 500 ml  Net 446.24 ml   Filed Weights   01/09/24 0500 01/10/24 0502 01/11/24 0500  Weight: 94.9 kg 90.8 kg 92.3 kg    Examination:  General exam: Very weak and fatigued. Respiratory system: Poor respiratory effort.  Coarse breath sounds.  Tachypneic.  4 : Cardiovascular system: S1 S2, RRR, no murmurs, 3+ pitting edema BLE Gastrointestinal system: Soft, NT/ND, normal bowel sounds Central nervous system: eyes closed, responds to voice  Extremities: Markedly decreased power bilateral upper and lower extremities Skin: bruising right upper arm Psychiatry: calm    Data Reviewed: I have personally reviewed following labs and imaging studies  CBC: Recent Labs  Lab 01/05/24 0623 01/06/24 0621 01/07/24 0419 01/08/24 0430  WBC 6.5 6.0 6.5 6.1  HGB 9.3* 7.9* 8.2* 8.2*  HCT 28.2* 25.5* 25.0* 26.5*  MCV 100.4* 100.0 102.5* 100.4*  PLT 132* 220 241 225   Basic Metabolic  Panel: Recent Labs  Lab 01/05/24 0623 01/06/24 0621 01/07/24 0419 01/08/24 0430 01/10/24 0548 01/11/24 0340  NA 137 141 141 142 142 147*  K 4.1 3.8 3.6 3.5 3.9 4.0  CL 100 102 100 97* 100 103  CO2 27 30 33* 35* 32 35*  GLUCOSE 90 97 105* 93 124* 124*  BUN 44* 40* 37* 37* 41* 53*  CREATININE 1.77* 1.81* 1.87* 1.85* 2.21* 2.56*  CALCIUM 8.6* 8.5* 8.5* 8.8* 8.9 9.2  MG 2.2 2.0  --   --  2.1 2.4  PHOS 3.8 3.0  --   --  3.4 4.1   GFR: Estimated Creatinine Clearance: 23.4 mL/min (A) (by C-G formula based on SCr of 2.56 mg/dL (H)). Liver Function Tests: No results for input(s): "AST", "ALT", "ALKPHOS", "BILITOT", "PROT", "ALBUMIN " in the last 168 hours.  No results for input(s): "LIPASE", "AMYLASE" in the last 168 hours. No results for input(s): "AMMONIA" in the last 168 hours. Coagulation Profile: No results for input(s): "INR", "PROTIME" in the last 168 hours. Cardiac Enzymes: No results for input(s): "CKTOTAL", "CKMB", "CKMBINDEX", "TROPONINI" in the last 168 hours. BNP (last 3 results) No results for input(s): "PROBNP" in the last 8760 hours. HbA1C: No results for input(s): "HGBA1C" in the last 72 hours. CBG: Recent Labs  Lab 01/05/24 0749 01/08/24 2025 01/09/24 0019 01/09/24 0724 01/09/24 1135  GLUCAP 86 125*  113* 98 126*   Lipid Profile: No results for input(s): "CHOL", "HDL", "LDLCALC", "TRIG", "CHOLHDL", "LDLDIRECT" in the last 72 hours. Thyroid  Function Tests: No results for input(s): "TSH", "T4TOTAL", "FREET4", "T3FREE", "THYROIDAB" in the last 72 hours. Anemia Panel: No results for input(s): "VITAMINB12", "FOLATE", "FERRITIN", "TIBC", "IRON ", "RETICCTPCT" in the last 72 hours. Sepsis Labs: No results for input(s): "PROCALCITON", "LATICACIDVEN" in the last 168 hours.  Recent Results (from the past 240 hours)  Culture, blood (Routine X 2) w Reflex to ID Panel     Status: None   Collection Time: 01/02/24  2:58 PM   Specimen: BLOOD  Result Value Ref Range  Status   Specimen Description BLOOD BLOOD LEFT HAND  Final   Special Requests   Final    BOTTLES DRAWN AEROBIC AND ANAEROBIC Blood Culture results may not be optimal due to an inadequate volume of blood received in culture bottles   Culture   Final    NO GROWTH 5 DAYS Performed at [facility], [address] [place], [place] [address]    Report Status 01/07/2024 FINAL  Final  Culture, blood (Routine X 2) w Reflex to ID Panel     Status: None   Collection Time: 01/02/24  3:06 PM   Specimen: BLOOD  Result Value Ref Range Status   Specimen Description BLOOD BLOOD LEFT WRIST  Final   Special Requests   Final    BOTTLES DRAWN AEROBIC AND ANAEROBIC Blood Culture adequate volume   Culture   Final    NO GROWTH 5 DAYS Performed at [facility], [address], [place], [place] [address]    Report Status 01/07/2024 FINAL  Final         Radiology Studies: No results found.      Scheduled Meds:  (feeding supplement) PROSource Plus  30 mL Oral BID BM   alteplase   2 mg Intracatheter Once   apixaban   5 mg Oral BID   vitamin C   500 mg Oral BID   brimonidine   1 drop Both Eyes TID   Chlorhexidine  Gluconate Cloth  6 each Topical Daily   diclofenac  Sodium  2 g Topical BID   dorzolamide   1 drop Left Eye TID   doxazosin   1 mg Oral Daily   DULoxetine   30 mg Oral Daily   feeding supplement (NEPRO CARB STEADY)  237 mL Oral TID BM   furosemide   80 mg Intravenous Once   iron  polysaccharides  150 mg Oral Daily   latanoprost   1 drop Both Eyes QHS   levothyroxine   150 mcg Oral Q0600   lidocaine   1 patch Transdermal Q24H   midodrine   10 mg Oral TID WC   mirtazapine   15 mg Oral QHS   multivitamin with minerals  1 tablet Oral Daily   mouth rinse  15 mL Mouth Rinse 4 times per day   senna-docusate  1 tablet Oral BID   thiamine   100 mg Per Tube Daily   Vitamin D  (Ergocalciferol )  50,000 Units Oral Q7 days   Continuous Infusions:  albumin  human Stopped (01/11/24 0508)      LOS: 15 days   CRITICAL CARE Performed by: Raymonde Calico   Total critical care time: 48 minutes  Critical care time was exclusive of separately billable procedures and treating other patients.  Critical care was necessary to treat or prevent imminent or life-threatening deterioration.  Critical care was time spent personally by me on the following activities: development of treatment plan with  patient and/or surrogate as well as nursing, discussions with consultants, evaluation of patient's response to treatment, examination of patient, obtaining history from patient or surrogate, ordering and performing treatments and interventions, ordering and review of laboratory studies, ordering and review of radiographic studies, pulse oximetry and re-evaluation of patient's condition.    Raymonde Calico, MD [facility]   If 7PM-7AM, please contact night-coverage  01/11/2024, 8:12 AM

## 2024-01-11 NOTE — Consult Note (Signed)
 NAME:  Daniel Carr, MRN:  [id number], DOB:  05/22/1935, LOS: 15 ADMISSION DATE:  12/27/2023, CONSULTATION DATE:  12/27/2023 REFERRING MD:  Dr. Valetta Gaudy, CHIEF COMPLAINT:  Shortness of Breath   Brief Pt Description / Synopsis:  89 y.o. Male admitted with Acute Metabolic Encephalopathy, Acute Hypoxic Hypercapnic Respiratory Failure in the setting of Severe Sepsis with Septic Shock due to Enterococcus Faecalis BACTEREMIA due to UTI, transaminitis and AKI requiring intubation and mechanical ventilation, along with initiation of CRRT.  History of Present Illness:  This is an 89 yo male who presented to [facility] ER via EMS on 12/31 from [facility] with altered mental status and acute respiratory failure.  Pts family reports he has had worsening bilateral lower extremity edema and decreased urinary output.  His PCP increased his outpatient lasix  dose to 40 mg daily, however his urinary output did not improve.  Pts granddaughter reports he also c/o chills and fevers.  He was recently hospitalized at [facility] on 12/02/23 with a right humerus fracture from a mechanical fall on 12/3 and evaluated by ortho who recommended a sling for comfort and outpatient follow-up.  However, during presentation he was found to have progressive dyspnea; severe pulmonary hypertension; hyponatremia; acute blood loss anemia suspected secondary to right chest wall hematoma and acute kidney injury on chronic kidney disease requiring hospital admission.  Following treatment he was discharged on 12/17 to [facility] for rehab and instructed not to resume his outpatient apixaban  until 12/18.  His daughter reports the pts apixaban  has been resumed as instructed.     ED Course  Upon arrival to the ER pt in severe respiratory distress, hypotensive, and minimally responsive requiring mechanical intubation.  Significant lab results were: Na+ 125/K+ 5.9/chloride 86/BUN 72/creatinine 4.46/calcium 8.7/albumin  2.5/AST 143/ALT 89/troponin 550/lactic  acid 2.4/wbc 15.9/hgb 11.2/PT 25.0/INR 2.2/APTT 48.  CXR concerning for possible small right pleural effusion.  He met sepsis criteria and received: 2L saline bolus; vancomycin , and aztreonam .  Despite iv fluid resuscitation pt remained hypotensive requiring levophed  gtt.  PCCM team contacted for ICU  admission.  Please see "Significant Hospital Events" section below for full detailed hospital course.    Pertinent  Medical History  Arthritis  Glaucoma HTN  Hypothyroidism  HLD Hyponatremia  HFpEF  Right Humerus Fracture secondary to mechanical fall (11/29/2023) Anemia of Chronic Disease  COPD Aortic Stenosis   MGUS PE in 2022 previously on apixaban  but pt self discontinued restarted on 12/14/23 due to CTEPH   Micro Data:  12/31: Covid/RSV/Flu PCR>>negative 12/31: Blood cultures>> Enterococcus Faecalis 12/31: MRSA PCR>>negative 1/1: Urine>> Enterococcus Faecalis 1/1: Tracheal aspirate>> normal respiratory flora 1/1: Hepatitis panel>>negative 1/6: Repeat blood cultures x2>> no growth  Antimicrobials:   Anti-infectives (From admission, onward)    Start     Dose/Rate Route Frequency Ordered Stop   12/29/23 1200  ampicillin  (OMNIPEN) 2 g in sodium chloride  0.9 % 100 mL IVPB        2 g 300 mL/hr over 20 Minutes Intravenous Every 6 hours 12/29/23 0740 01/10/24 1719   12/28/23 0700  ampicillin  (OMNIPEN) 2 g in sodium chloride  0.9 % 100 mL IVPB  Status:  Discontinued        2 g 300 mL/hr over 20 Minutes Intravenous Every 8 hours 12/28/23 0308 12/29/23 0740   12/27/23 2200  piperacillin -tazobactam (ZOSYN ) IVPB 2.25 g  Status:  Discontinued        2.25 g 100 mL/hr over 30 Minutes Intravenous Every 8 hours 12/27/23 1530 12/28/23 0307   12/27/23  1245  aztreonam  (AZACTAM ) 2 g in sodium chloride  0.9 % 100 mL IVPB        2 g 200 mL/hr over 30 Minutes Intravenous  Once 12/27/23 1233 12/27/23 1343   12/27/23 1245  vancomycin  (VANCOCIN ) IVPB 1000 mg/200 mL premix        1,000 mg 200  mL/hr over 60 Minutes Intravenous  Once 12/27/23 1233 12/27/23 1446       Significant Hospital Events: Including procedures, antibiotic start and stop dates in addition to other pertinent events   12/31: Pt admitted with acute hypercapnic respiratory failure, septic shock, AECOPD, pneumonia suspect aspiration, acute kidney injury, acute on chronic hyponatremia, transaminitis, and acute metabolic encephalopathy mechanical intubation  12/27/22- patient with improved mentation post CRRT.  Reducing his sedatives on MV today.  Sepsis bacteremia, ID on case.  He is oozing blood from IV sites, will order DIC panel and pathology slide review.   12/29/23- patient following verbal communication this am, remains in anasarca. Bacteremia being treated. He has CRRT with now removal of fluid at 100/hr.  Starting NG feeds.  Tracheal aspirate with GPCs in pairs.  ID on case continues with ampicillin  reducing steroids and off levophed  today. 12/30/23- patient for SBT today.  Last day of steroids today. Failed SBT today.  S/p iD evaluation 12/31/23-patient liberated from MV today on BIPAP, required precedex  for aggitation. Remain on ampicillin . Met with daughter and granddaughter few times to review findings and medical plan.  01/01/24- patient improved overnight, weaned of bipap on hfnc, remains edematous but improved on CRRT with 100/h UF, met with family reviewed medical plan, ordering PT/OT for tommorow. Remains on Frost heparin  with CTEPH/chronic DVTs. CRRT stopped due to clotting filter. 01/02/24- Off BiPAP and Precedex , respiratory and mental status improving.  Vasopressors weaned off.  Creatinine increased, Urine output improved, Nephrology to decide on whether to resume CRRT vs trial intermittent HD vs observation.  Resume outpatient Eliquis  at reduced dose (2.5 mg BID) for chronic bilateral LE DVT's.  1/7: Transferred to [facility] hospitalist service  1/11: Temporary dialysis catheter removed, Nephrology signed off. Remains on low  dose Lasix  gtt. 1/15: Somnolence due to CO2 narcosis from Acute Hypoxic & Hypercapnic Respiratory Failure requiring BiPAP, suspect due to pulmonary edema.  Transferred to Stepdown and PCCM re-consulted.  Interim History / Subjective:  -This morning noted to be somnolent, concern for CO2 Narcosis as ABG with Respiratory Acidosis: pH 7.2 / pCO2 102/ pO2 53 / Bicarb 39/9 ~ when seen on floor he would withdraw and moan to painful stimuli -Was placed on BiPAP and given 80 mg IV Lasix  by TRH ~ BNP found to be 1,762 -To transfer to Stepdown, PCCM re-consulted ~ after arrival to Stepdown, ABG is improved and pt is arousing to voice and following commands   Objective   Blood pressure 107/66, pulse 96, temperature 97.9 F (36.6 C), temperature source Axillary, resp. rate 20, height 6' 0.99" (1.854 m), weight 92.3 kg, SpO2 99%.        Intake/Output Summary (Last 24 hours) at 01/11/2024 0820 Last data filed at 01/11/2024 0600 Gross per 24 hour  Intake 946.24 ml  Output 500 ml  Net 446.24 ml   Filed Weights   01/09/24 0500 01/10/24 0502 01/11/24 0500  Weight: 94.9 kg 90.8 kg 92.3 kg    Examination: General: Acute on chronically ill-appearing elderly frail male, laying in bed,somnolent, placed on BiPAP, no acute distress HENT: Atraumatic, normocephalic, neck supple, difficult to assess JVD due to BiPAP Lungs:  Coarse rhonchi throughout, BiPAP assisted, even, nonlabored Cardiovascular: Regular rate and rhythm, S1-S2, no murmurs, rubs, gallops Abdomen: Soft, nontender, nondistended, no guarding or rebound tenderness, bowel sounds positive x 4 Extremities: Right arm sling present secondary to recent right humerus fracture with 2+ edema  Neuro: Somnolent, arouses and moans to pain, unable to assess orientation, not following commands GU: Foley catheter in place draining yellow urine (outpatient foley exchanged)  Resolved Hospital Problem list     Assessment & Plan:   #Septic Shock ~  RESOLVED #Acute on Chronic HFpEF #Elevated Troponin, suspect demand ischemia  #CTEPH PMHx: HTN, aortic stenosis, PE, HLD Echocardiogram 12/28/23: LVEF 60-65%, indeterminate diastolic parameters, moderate Aortic stenosis -Continuous cardiac monitoring -Maintain MAP >65 -Vasopressors as needed to maintain MAP goal ~ currently not requiring -Continue Midodrine  10 mg TID -HS Troponin peaked at 550 -BNP is 1,762 on 1/15 -Diuresis as BP and renal function permits ~ given 80 mg IV Lasix  x1 dose on 1/15  #Severe Sepsis ~ Resolved #Enterococcus Faecalis BACTEREMIA ~ Treated #Enterococcus Faecalis UTI ~ Treated #Questionable Pneumonia ~ Treated -Monitor fever curve -Trend WBC's & Procalcitonin -Follow cultures as above -ID was following, has now signed off ~ Completed 2 weeks of Ampicillin  (completed course on 01/10/24): 2D Echo with no vegetation, not recommending ETT as felt to be very low risk for endocarditis   #Acute Hypoxic & Hypercapnic Respiratory Failure in setting of Pulmonary Edema & questionable Aspiration #AECOPD -EXTUBATED 1/4 -Supplemental O2 as needed to maintain O2 sats 88 to 92% -BiPAP, wean as tolerated -High risk for intubation -Follow intermittent Chest X-ray & ABG as needed -Bronchodilators & Pulmicort nebs -ABX as above -Diuresis as BP and renal function permits ~ given 80 mg IV Lasix  x1 dose on 1/15 -Pulmonary toilet as able  #Acute kidney injury suspect secondary to ATN and diuretic therapy  #Hyperkalemia ~ RESOLVED #Acute on chronic hyponatremia ~ RESOLVED #Lactic acidosis ~ RESOLVED -Monitor I&O's / urinary output -Follow BMP -Ensure adequate renal perfusion -Avoid nephrotoxic agents as able -Replace electrolytes as indicated ~ Pharmacy following for assistance with electrolyte replacement -Nephrology was following, has since signed off   #Previous Right Humerus Fracture #Chronic Bilateral Lower Extremity DVT's -Venous US  of Bilateral LE on 12/31 positive  for extensive bilateral non-occlusive wall thickening/chronic DVT extending from level of the bilateral common femoral through the bilateral popliteal veins.  No acute or occlusive DVT within either lower extremity -Venous US  of RUE on 1/1 negative for DVT -Fracture was being managed conservatively outpatient by Ortho ~ continue sling -Continue outpatient Eliquis  5 mg BID  #Anemia of chronic disease -Monitor for S/Sx of bleeding -Trend CBC -Resume home Eliquis  for AC/VTE Prophylaxis  -Transfuse for Hgb <7  #Hyperglycemia #Hypothyroidism -CBG's ac & qhs; Target range of 140 to 180 -SSI  -Follow ICU Hypo/Hyperglycemia protocol -Continue Synthroid   #Acute Metabolic Encephalopathy #CO2 Narcosis CT Head on 12/31 negative for acute intracranial process -Treatment of metabolic derangements and hypercapnia as outlined above -Provide supportive care -Promote normal sleep/wake cycle and family presence -Avoid sedating medications as able      Pt is critically ill with multiorgan failure requiring BiPAP.  Prognosis is guarded, high risk for further decompensation requiring intubation, along with cardiac arrest and death.  Given his current illness superimpose on multiple chronic co-morbidities and advanced age, recommend DNR/DNI status.    Best Practice (right click and "Reselect all SmartList Selections" daily)   Diet/type: NPO until mental status improved DVT prophylaxis: DOAC GI prophylaxis: N/A Lines: N/A Foley:  Yes, and it is still needed Code Status:  full code Last date of multidisciplinary goals of care discussion [1/15]  1/15: Pt's daughter updated at bedside on plan of care.  Labs   CBC: Recent Labs  Lab 01/05/24 0623 01/06/24 0621 01/07/24 0419 01/08/24 0430  WBC 6.5 6.0 6.5 6.1  HGB 9.3* 7.9* 8.2* 8.2*  HCT 28.2* 25.5* 25.0* 26.5*  MCV 100.4* 100.0 102.5* 100.4*  PLT 132* 220 241 225    Basic Metabolic Panel: Recent Labs  Lab 01/05/24 0623  01/06/24 0621 01/07/24 0419 01/08/24 0430 01/10/24 0548 01/11/24 0340  NA 137 141 141 142 142 147*  K 4.1 3.8 3.6 3.5 3.9 4.0  CL 100 102 100 97* 100 103  CO2 27 30 33* 35* 32 35*  GLUCOSE 90 97 105* 93 124* 124*  BUN 44* 40* 37* 37* 41* 53*  CREATININE 1.77* 1.81* 1.87* 1.85* 2.21* 2.56*  CALCIUM 8.6* 8.5* 8.5* 8.8* 8.9 9.2  MG 2.2 2.0  --   --  2.1 2.4  PHOS 3.8 3.0  --   --  3.4 4.1   GFR: Estimated Creatinine Clearance: 23.4 mL/min (A) (by C-G formula based on SCr of 2.56 mg/dL (H)). Recent Labs  Lab 01/05/24 0623 01/06/24 0621 01/07/24 0419 01/08/24 0430  WBC 6.5 6.0 6.5 6.1    Liver Function Tests: No results for input(s): "AST", "ALT", "ALKPHOS", "BILITOT", "PROT", "ALBUMIN " in the last 168 hours.  No results for input(s): "LIPASE", "AMYLASE" in the last 168 hours.  No results for input(s): "AMMONIA" in the last 168 hours.   ABG    Component Value Date/Time   PHART 7.2 (L) 01/11/2024 0741   PCO2ART 102 (HH) 01/11/2024 0741   PO2ART 53 (L) 01/11/2024 0741   HCO3 39.9 (H) 01/11/2024 0741   ACIDBASEDEF 3.3 (H) 12/27/2023 1659   O2SAT 85.2 01/11/2024 0741     Coagulation Profile: No results for input(s): "INR", "PROTIME" in the last 168 hours.   Cardiac Enzymes: No results for input(s): "CKTOTAL", "CKMB", "CKMBINDEX", "TROPONINI" in the last 168 hours.  HbA1C: Hgb A1c MFr Bld  Date/Time Value Ref Range Status  12/31/2023 08:39 AM 5.3 4.8 - 5.6 % Final    Comment:    (NOTE)         Prediabetes: 5.7 - 6.4         Diabetes: >6.4         Glycemic control for adults with diabetes: <7.0     CBG: Recent Labs  Lab 01/05/24 0749 01/08/24 2025 01/09/24 0019 01/09/24 0724 01/09/24 1135  GLUCAP 86 125* 113* 98 126*    Review of Systems:   Unable to assess due to AMS   Past Medical History:  He,  has a past medical history of Arthritis, childhood infection, Glaucoma, Hypertension, Hypothyroidism, Sinus problem, and Ulnar neuropathy.   Surgical  History:   Past Surgical History:  Procedure Laterality Date   APPENDECTOMY     CATARACT EXTRACTION Bilateral    COLONOSCOPY     JOINT REPLACEMENT Right    KNEE ARTHROPLASTY Left 12/24/2020   Procedure: COMPUTER ASSISTED TOTAL KNEE ARTHROPLASTY;  Surgeon: Arlyne Lame, MD;  Location: [facility] ORS;  Service: Orthopedics;  Laterality: Left;  3RD CASE   KNEE SURGERY Right 10/04/2011   replacement   NM MYOVIEW  ([facility] HX)  07/21/2017   [place]. No ischemia.    RETINAL DETACHMENT REPAIR W/ SCLERAL BUCKLE LE     TEE WITHOUT CARDIOVERSION N/A 04/29/2023   Procedure: TRANSESOPHAGEAL  ECHOCARDIOGRAM (TEE);  Surgeon: Dorita Garter, MD;  Location: [facility] ORS;  Service: Cardiovascular;  Laterality: N/A;   TONSILLECTOMY       Social History:   reports that he quit smoking about 54 years ago. His smoking use included cigarettes. He started smoking about 60 years ago. He has a 1.5 pack-year smoking history. He has never used smokeless tobacco. He reports that he does not drink alcohol  and does not use drugs.   Family History:  His family history includes Hypertension in his father and mother; Stroke in his father and sister.   Allergies No Known Allergies   Home Medications  Prior to Admission medications   Medication Sig Start Date End Date Taking? Authorizing Provider  acetaminophen  (TYLENOL ) 500 MG tablet Take 1,000 mg by mouth every 8 (eight) hours. 12/13/23  Yes [provider]  apixaban  (ELIQUIS ) 5 MG TABS tablet Take 5 mg by mouth 2 (two) times daily. 12/14/23  Yes [provider]  azelastine  (ASTELIN ) 0.1 % nasal spray Place 2 sprays into both nostrils 2 (two) times daily. 11/09/19  Yes [provider]  brimonidine  (ALPHAGAN ) 0.2 % ophthalmic solution Place 1 drop into both eyes 3 (three) times daily. 03/26/15  Yes [provider]  Cholecalciferol (VITAMIN D3) 10 MCG (400 UNIT) tablet Take 800 Units by mouth daily. 12/14/23  Yes [provider]   diclofenac  Sodium (VOLTAREN ) 1 % GEL Apply 2 g topically 4 (four) times daily. 12/13/23 12/12/24 Yes [provider]  dorzolamide  (TRUSOPT ) 2 % ophthalmic solution Place 1 drop into the left eye 3 (three) times daily. 11/08/21  Yes [provider]  doxycycline (ADOXA) 100 MG tablet Take 100 mg by mouth 2 (two) times daily. X 7 days   Yes [provider]  dutasteride  (AVODART ) 0.5 MG capsule Take 1 capsule by mouth once daily 12/23/22  Yes [name], Erlinda Haws, MD  ferrous sulfate  325 (65 FE) MG tablet Take 325 mg by mouth daily.   Yes [provider]  furosemide  (LASIX ) 20 MG tablet Take 20 mg by mouth daily.   Yes [provider]  hydroxychloroquine (PLAQUENIL) 200 MG tablet Take 200 mg by mouth daily. 09/07/23  Yes [provider]  latanoprost  (XALATAN ) 0.005 % ophthalmic solution  03/25/22  Yes [provider]  levocetirizine (XYZAL ) 5 MG tablet Take 5 mg by mouth every evening.   Yes [provider]  levothyroxine  (SYNTHROID ) 150 MCG tablet Take 1 tablet by mouth once daily 12/23/22  Yes [name], Erlinda Haws, MD  lidocaine  4 % Place 1 patch onto the skin daily. 12/13/23  Yes [provider]  lovastatin  (MEVACOR ) 40 MG tablet Take 1 tablet by mouth once daily 05/09/23  Yes [name], Erlinda Haws, MD  melatonin 3 MG TABS tablet Take 3 mg by mouth at bedtime. 12/13/23  Yes [provider]  mirtazapine  (REMERON ) 15 MG tablet Take 15 mg by mouth at bedtime. 12/13/23  Yes [provider]  Multiple Vitamin (DAILY VITAMIN PO) Take 1 tablet by mouth daily.   Yes [provider]  oxyCODONE  (OXY IR/ROXICODONE ) 5 MG immediate release tablet Take 2.5 mg by mouth every 6 (six) hours as needed for severe pain (pain score 7-10) or moderate pain (pain score 4-6).   Yes [provider]  polyethylene glycol (MIRALAX  / GLYCOLAX ) 17 g packet Take 17 g by mouth 2 (two) times daily. 12/13/23 01/12/24 Yes [provider]  senna (SENOKOT) 8.6 MG tablet Take 2 tablets by mouth  2 (two) times daily. 12/13/23 01/12/24 Yes [provider]  simethicone (MYLICON) 80 MG chewable tablet Chew 80 mg by mouth every 6 (six) hours as needed for flatulence. 12/13/23 01/12/24 Yes [provider]  sodium chloride  1 g tablet Take 1 g by mouth daily.   Yes [provider]  umeclidinium-vilanterol (ANORO ELLIPTA) 62.5-25 MCG/ACT AEPB Inhale 1 puff into the lungs daily. 12/13/23  Yes [provider]  albuterol  (VENTOLIN  HFA) 108 (90 Base) MCG/ACT inhaler SMARTSIG:2 Puff(s) By Mouth As Directed Patient not taking: Reported on 12/27/2023    [provider]  aspirin  81 MG EC tablet Take 1 tablet (81 mg total) by mouth daily. Swallow whole. Patient not taking: Reported on 12/27/2023 07/06/21   Lamon Pillow, MD  clotrimazole -betamethasone  (LOTRISONE ) cream APPLY TOPICALLY ONCE DAILY Patient not taking: Reported on 03/21/2023 04/05/22   Lamon Pillow, MD  DULoxetine  (CYMBALTA ) 30 MG capsule Take 30 mg by mouth daily. 09/13/23 12/11/23  [provider]  ipratropium (ATROVENT ) 0.06 % nasal spray Place 1 spray into both nostrils 2 (two) times daily as needed for rhinitis. Patient not taking: Reported on 12/27/2023 03/15/18   [provider]  losartan (COZAAR) 25 MG tablet Take 25 mg by mouth daily. Patient not taking: Reported on 12/27/2023    [provider]  pregabalin  (LYRICA ) 50 MG capsule Take 1 capsule (50 mg total) by mouth 2 (two) times daily. Patient not taking: Reported on 12/27/2023 09/28/23   Lamon Pillow, MD  vitamin B-12 (CYANOCOBALAMIN) 100 MCG tablet Take 100 mcg by mouth daily. Unsure of dose  is OTC med Patient not taking: Reported on 09/22/2023    [provider]     Critical care time: 55 minutes     Cherylann Corpus, AGACNP-BC [facility] Prefer epic messenger for cross cover needs If after hours,  please call E-link

## 2024-01-12 DIAGNOSIS — J9601 Acute respiratory failure with hypoxia: Secondary | ICD-10-CM | POA: Diagnosis not present

## 2024-01-12 MED ORDER — MORPHINE SULFATE 10 MG/5ML PO SOLN: 2.5000 mg | Freq: Four times a day (QID) | ORAL | 0 refills | Status: DC | PRN

## 2024-01-12 NOTE — Progress Notes (Signed)
   01/12/24 1200  Spiritual Encounters  Type of Visit Initial  Care provided to: Family  Referral source Nurse (RN/NT/LPN)  Reason for visit Urgent spiritual support  OnCall Visit No  Spiritual Framework  Presenting Themes Caregiving needs  Family Stress Factors Major life changes  Interventions  Spiritual Care Interventions Made Established relationship of care and support  Intervention Outcomes  Outcomes Connection to spiritual care  Spiritual Care Plan  Spiritual Care Issues Still Outstanding No further spiritual care needs at this time (see row info)   Family said they only wanted help getting their love transported home. It was taking longer than they wanted. I spoke with nurse to get help. They working to make it happen

## 2024-01-12 NOTE — Progress Notes (Signed)
 Nutrition Brief Note  Chart reviewed. Pt now transitioning to comfort care.  No further nutrition interventions planned at this time.  Please re-consult as needed.   Levada Schilling, RD, LDN, CDCES Registered Dietitian III Certified Diabetes Care and Education Specialist If unable to reach this RD, please use "RD Inpatient" group chat on secure chat between hours of 8am-4 pm daily

## 2024-01-12 NOTE — Progress Notes (Addendum)
[facility], Room 126 [facility]) Hospital Liaison Note  Received request from [facility] , Transitions of Care Manager, for hospice services at home after discharge.  Spoke with Daughter, Daniel Carr, to initiate education related to hospice philosophy, services, and team approach to care.  Daughter verbalized understanding of information given.  Per discussion, the plan is for discharge home by EMS today.   DME needs discussed.  Patient has the following equipment in the home:  [facility] lift, WC, [facility] etc. Daughter stated that she had no additional DME needs.  . Patient/family requests the following equipment in the home: Home 02.   The address has been verified and is correct in the chart. Daniel Carr phone number [phone] is the family contact to arrange time of equipment delivery.    Please send signed and completed DNR home with the patient/family.  Please provide prescriptions at discharge as needed to ensure ongoing symptom management.   [facility] information and contact numbers given to daughtrer.   Above information shared with [name], Transitions of Care Manager.     Please call with any Hospice related questions or concerns.  Thank you for the opportunity to participate in this patient's care.  Redge Gainer, [facility] Liaison [phone]

## 2024-01-12 NOTE — TOC Transition Note (Signed)
Transition of Care [facility]) - Discharge Note   Patient Details  Name: Daniel Carr MRN: [id number] Date of Birth: 1935-08-06  Transition of Care [facility]) CM/SW Contact:  Garret Reddish, RN Phone Number: 01/12/2024, 11:47 AM   Clinical Narrative:    Chart reviewed.  Noted that patient has orders for discharge today.    I have spoken with patient's daughter Daniel Carr.  She informs me that she would like her father to go home with home Hospice.  She would like to use [facility].    I have asked Annice Pih with [facility] to accept home Hospice referral.    I have arranged [facility] to transport  patient to his home today.    I have made staff nurse aware.    Final next level of care: Home w Hospice Care Barriers to Discharge: No Barriers Identified   Patient Goals and CMS Choice   CMS Medicare.gov Compare Post Acute Care list provided to:: Patient Represenative (must comment) (Patient's daughter Daniel Carr) Choice offered to / list presented to : Adult Children      Discharge Placement                  Name of family member notified: Daniel Carr Patient and family notified of of transfer: 01/12/24  Discharge Plan and Services Additional resources added to the After Visit Summary for                                       Social Drivers of Health (SDOH) Interventions SDOH Screenings   Food Insecurity: Patient Unable To Answer (12/28/2023)  Housing: Patient Unable To Answer (12/28/2023)  Transportation Needs: Patient Unable To Answer (12/28/2023)  Utilities: Patient Unable To Answer (12/28/2023)  Alcohol Screen: Low Risk  (09/07/2023)  Depression (PHQ2-9): Low Risk  (09/07/2023)  Recent Concern: Depression (PHQ2-9) - Medium Risk (08/12/2023)  Financial Resource Strain: Low Risk  (11/22/2023)   Received from [facility]  Physical Activity: Inactive (09/25/2023)  Social Connections: Unknown (12/28/2023)  Stress: No Stress  Concern Present (09/25/2023)  Tobacco Use: Medium Risk (12/12/2023)   Received from [facility]     Readmission Risk Interventions     No data to display

## 2024-01-12 NOTE — Discharge Summary (Signed)
Daniel Carr XBM:[id number] DOB: [birth year] DOA: 12/27/2023  PCP: Malva Limes, MD  Admit date: 12/27/2023 Discharge date: 01/12/2024  Time spent: 35 minutes     Discharge Diagnoses:  Principal Problem:   Acute hypoxemic respiratory failure (HCC) Active Problems:   Bacteremia due to Enterococcus   AKI (acute kidney injury) (HCC)   Complicated UTI (urinary tract infection)   Malnutrition of moderate degree   Discharge Condition: poor  Diet recommendation: ad lib  Filed Weights   01/09/24 0500 01/10/24 0502 01/11/24 0500  Weight: 94.9 kg 90.8 kg 92.3 kg    History of present illness:  From admission h and p This is an 89 yo male who presented to [facility] ER via EMS on 12/31 from [facility] with altered mental status and acute respiratory failure.  Pts family reports he has had worsening bilateral lower extremity edema and decreased urinary output.  His PCP increased his outpatient lasix dose to 40 mg daily, however his urinary output did not improve.  Pts granddaughter reports he also c/o chills and fevers.  He was recently hospitalized at [facility] on 12/02/23 with a right humerus fracture from a mechanical fall on 12/3 and evaluated by ortho who recommended a sling for comfort and outpatient follow-up.  However, during presentation he was found to have progressive dyspnea; severe pulmonary hypertension; hyponatremia; acute blood loss anemia suspected secondary to right chest wall hematoma and acute kidney injury on chronic kidney disease requiring hospital admission.  Following treatment he was discharged on 12/17 to [facility] for rehab and instructed not to resume his outpatient apixaban until 12/18.  His daughter reports the pts apixaban has been resumed as instructed.    Hospital Course:  Significant Hospital Events 12/31: Pt admitted with acute hypercapnic respiratory failure, septic shock, AECOPD, pneumonia suspect aspiration, acute kidney injury, acute on chronic  hyponatremia, transaminitis, and acute metabolic encephalopathy mechanical intubation  12/27/22- patient with improved mentation post CRRT.  Reducing his sedatives on MV today.  Sepsis bacteremia, ID on case.  He is oozing blood from IV sites, will order DIC panel and pathology slide review.   12/29/23- patient following verbal communication this am, remains in anasarca. Bacteremia being treated. He has CRRT with now removal of fluid at 100/hr.  Starting NG feeds.  Tracheal aspirate with GPCs in pairs.  ID on case continues with ampicillin reducing steroids and off levophed today. 12/30/23- patient for SBT today.  Last day of steroids today. Failed SBT today.  S/p iD evaluation 12/31/23-patient liberated from MV today on BIPAP, required precedex for aggitation. Remain on ampicillin. Met with daughter and granddaughter few times to review findings and medical plan.  01/01/24- patient improved overnight, weaned of bipap on hfnc, remains edematous but improved on CRRT with 100/h UF, met with family reviewed medical plan, ordering PT/OT for tommorow. Remains on Long heparin with CTEPH/chronic DVTs. CRRT stopped due to clotting filter. 01/02/24- Off BiPAP and Precedex, respiratory and mental status improving.  Vasopressors weaned off.  Creatinine increased, Urine output improved, Nephrology to decide on whether to resume CRRT vs trial intermittent HD vs observation.  Resume outpatient Eliquis at reduced dose (2.5 mg BID) for chronic bilateral LE DVT's.  1/7: Transferred to [facility] hospitalist service 1/15: transferred back to icu for worsening encephalopathy, hypercapnic respiratory failure. Family transitioned to comfort care overnight  # Septic Shock ~ RESOLVED # Acute on Chronic HFpEF # Elevated Troponin, suspect demand ischemia  # CTEPH PMHx: HTN, aortic stenosis, PE, HLD Echocardiogram 12/28/23: LVEF  60-65%, indeterminate diastolic parameters, moderate Aortic stenosis Shock physiology has resolved Plan: Continue  midodrine 10 mg 3 times daily, wean if tolerated MAP goal 65 Diurese today with lasix 80 IV once     Moderate protein calorie malnutrition Patient is not meeting his nutritional goals.  If we are still pursuing aggressive care he may need an enteral feeding tube.  I had a lengthy discussion with patient and daughter at bedside on 1/14.  I explained that he is unlikely to meet his nutritional goals without placement of enteral feeding tube.  This feeding tube would not be a long-term solution and I would only recommend a 5-day maximum course.  The patient does not seem especially participative in interview however daughter is interested in placement.  After lengthy discussion with her own daughters decision made to defer feeding tube at this point.  I have great concerns about this patient's functional status and his recovery potential.  Given his lethargy and lack of willingness to participate in physical therapy and to consume enough calories to sustain his nutritional needs he is at very high risk for further deterioration and clinical decompensation.   # Severe Sepsis  # Enterococcus Faecalis BACTEREMIA # Enterococcus Faecalis UTI # Questionable Pneumonia Treated with 2 weeks abx   Acute Hypoxic & Hypercapnic Respiratory Failure AECOPD -EXTUBATED 1/4 - AM 1/15 obtuneded hypoxic, hypercapnic - transfer to step-down - diuresed - hypercarbia improved with bipap but remained tachypnic and altered - family transitioned to comfort care early morning of 1/16    Acute Metabolic Encephalopathy:  CT Head on 12/31 negative for acute intracranial process -Treatment of metabolic derangements as outlined above -Provide supportive care -Promote normal sleep/wake cycle and family presence -Avoid sedating medications as able - treating respiratory failure as above  Acute kidney injury suspect secondary to ATN and diuretic therapy  Patient still with peripheral edema Weaned off Lasix gtt. Serum  creatinine started to rise on 1/14   Previous Right Humerus Fracture Ortho advised sling for comfort  Chronic Bilateral Lower Extremity DVT's Treated with apixaban now discontinued   Anemia of chronic disease   Hypothyroidism:   Chronic urinary retention Patient failed voiding trial, Foley catheter was reinserted on 01/03/2024 Maintained on d/c for comfort   Vitamin D insufficiency:     Intractable pain Functional decline   Procedures: intubation   Consultations: pccm  Discharge Exam: Vitals:   01/11/24 2100 01/11/24 2200  BP: 123/84 100/62  Pulse: (!) 114 (!) 107  Resp: (!) 49 (!) 29  Temp:    SpO2: 93% 94%    General: obtunded Cardiovascular: no cyanosis Respiratory: tachypneic on bipap  Discharge Instructions   Discharge Instructions     Increase activity slowly   Complete by: As directed    No wound care   Complete by: As directed       Allergies as of 01/12/2024   No Known Allergies      Medication List     STOP taking these medications    acetaminophen 500 MG tablet Commonly known as: TYLENOL   albuterol 108 (90 Base) MCG/ACT inhaler Commonly known as: VENTOLIN HFA   apixaban 5 MG Tabs tablet Commonly known as: ELIQUIS   azelastine 0.1 % nasal spray Commonly known as: ASTELIN   brimonidine 0.2 % ophthalmic solution Commonly known as: ALPHAGAN   DAILY VITAMIN PO   diclofenac Sodium 1 % Gel Commonly known as: VOLTAREN   dorzolamide 2 % ophthalmic solution Commonly known as: TRUSOPT  DULoxetine 30 MG capsule Commonly known as: CYMBALTA   dutasteride 0.5 MG capsule Commonly known as: AVODART   ferrous sulfate 325 (65 FE) MG tablet   furosemide 20 MG tablet Commonly known as: LASIX   hydroxychloroquine 200 MG tablet Commonly known as: PLAQUENIL   latanoprost 0.005 % ophthalmic solution Commonly known as: XALATAN   levocetirizine 5 MG tablet Commonly known as: XYZAL   levothyroxine 150 MCG tablet Commonly known  as: SYNTHROID   lidocaine 4 %   lovastatin 40 MG tablet Commonly known as: MEVACOR   melatonin 3 MG Tabs tablet   mirtazapine 15 MG tablet Commonly known as: REMERON   oxyCODONE 5 MG immediate release tablet Commonly known as: Oxy IR/ROXICODONE   polyethylene glycol 17 g packet Commonly known as: MIRALAX / GLYCOLAX   pregabalin 50 MG capsule Commonly known as: Lyrica   senna 8.6 MG tablet Commonly known as: SENOKOT   simethicone 80 MG chewable tablet Commonly known as: MYLICON   sodium chloride 1 g tablet   umeclidinium-vilanterol 62.5-25 MCG/ACT Aepb Commonly known as: ANORO ELLIPTA   Vitamin D3 10 MCG (400 UNIT) tablet       No Known Allergies  Contact information for follow-up providers     [facility] Follow up.   Contact information: [address] [place] [address] [phone]              Contact information for after-discharge care     Destination     [facility]  .   Service: Skilled Nursing Contact information: [address] [place] [address] [phone]                      The results of significant diagnostics from this hospitalization (including imaging, microbiology, ancillary and laboratory) are listed below for reference.    Significant Diagnostic Studies: DG Chest Port 1 View Result Date: 01/11/2024 CLINICAL DATA:  200808 Hypoxia 244010 EXAM: PORTABLE CHEST 1 VIEW COMPARISON:  12/30/2023 FINDINGS: Previously seen endotracheal tube, enteric tube, and central line have been removed. Patient is rotated. Grossly stable heart size. Prominent elevation of the left hemidiaphragm. Streaky bibasilar opacities with improving aeration compared to the prior study. Small right pleural effusion has decreased. No appreciable pneumothorax. Chronic deformity of the right shoulder. IMPRESSION: 1. Streaky bibasilar opacities with improving aeration compared to the prior study. 2. Small  right pleural effusion has decreased. Electronically Signed   By: Duanne Guess D.O.   On: 01/11/2024 09:11   DG Chest Port 1 View Result Date: 12/30/2023 CLINICAL DATA:  Lung infiltrate. EXAM: PORTABLE CHEST 1 VIEW COMPARISON:  X-ray 12/27/2023. older exams as well FINDINGS: Stable ET tube, enteric tube left IJ double-lumen catheter. Rotated radiograph to the right with some kyphosis obscuring the right lung apex. Stable elevation of the right hemidiaphragm. Persistent right lung base opacity with a point of effusion. No edema or pneumothorax. Overlapping cardiac leads. IMPRESSION: No significant interval change when adjusting for technique. Electronically Signed   By: Karen Kays M.D.   On: 12/30/2023 13:05   ECHOCARDIOGRAM COMPLETE Result Date: 12/28/2023    ECHOCARDIOGRAM REPORT   Patient Name:   AZAYAH BRAGG Date of Exam: 12/28/2023 Medical Rec #:  [id number]     Height:       73.0 in Accession #:    [id number]    Weight:       238.1 lb Date of Birth:  [birth year]      BSA:  2.317 m Patient Age:    54 years      BP:           134/72 mmHg Patient Gender: M             HR:           84 bpm. Exam Location:  [facility] Procedure: 2D Echo, Cardiac Doppler and Color Doppler Indications:     Bacteremia  History:         Patient has prior history of Echocardiogram examinations, most                  recent 08/20/2020. Aortic Valve Disease, Signs/Symptoms:Edema;                  Risk Factors:Former Smoker.  Sonographer:     Dondra Prader RVT RCS Referring Phys:  [id number] Judithe Modest Diagnosing Phys: Debbe Odea MD  Sonographer Comments: No apical window, suboptimal subcostal window, suboptimal apical window, echo performed with patient supine and on artificial respirator and no subcostal window. No acoustic windows, from any scanning plane. Aortic valve meaurements  are somewhat consistent but their accuracy is questionable IMPRESSIONS  1. Left ventricular ejection fraction, by estimation, is 60 to 65%.  Left ventricular ejection fraction by PLAX is 61 %. The left ventricle has normal function. The left ventricle has no regional wall motion abnormalities. Left ventricular diastolic parameters are indeterminate.  2. Right ventricular systolic function is normal. The right ventricular size is not well visualized.  3. Left atrial size was mildly dilated.  4. The mitral valve is degenerative. Mild mitral valve regurgitation.  5. The aortic valve is calcified. Aortic valve regurgitation is mild. Moderate aortic valve stenosis. Aortic valve mean gradient measures 20.0 mmHg. FINDINGS  Left Ventricle: Left ventricular ejection fraction, by estimation, is 60 to 65%. Left ventricular ejection fraction by PLAX is 61 %. The left ventricle has normal function. The left ventricle has no regional wall motion abnormalities. The left ventricular internal cavity size was normal in size. There is no left ventricular hypertrophy. Left ventricular diastolic parameters are indeterminate. Right Ventricle: The right ventricular size is not well visualized. No increase in right ventricular wall thickness. Right ventricular systolic function is normal. Left Atrium: Left atrial size was mildly dilated. Right Atrium: Right atrial size was normal in size. Pericardium: There is no evidence of pericardial effusion. Mitral Valve: The mitral valve is degenerative in appearance. Mild to moderate mitral annular calcification. Mild mitral valve regurgitation. Tricuspid Valve: The tricuspid valve is normal in structure. Tricuspid valve regurgitation is mild. Aortic Valve: The aortic valve is calcified. Aortic valve regurgitation is mild. Moderate aortic stenosis is present. Aortic valve mean gradient measures 20.0 mmHg. Aortic valve peak gradient measures 35.5 mmHg. Pulmonic Valve: The pulmonic valve was normal in structure. Pulmonic valve regurgitation is trivial. Aorta: The aortic root is normal in size and structure. Venous: The inferior vena cava  was not well visualized. IAS/Shunts: No atrial level shunt detected by color flow Doppler.  LEFT VENTRICLE PLAX 2D LV EF:         Left ventricular ejection fraction by PLAX is 61 %. LVIDd:         4.30 cm LVIDs:         2.90 cm LV PW:         1.00 cm LV IVS:        0.90 cm LVOT diam:     2.00 cm LVOT Area:  3.14 cm  LEFT ATRIUM         Index LA diam:    3.20 cm 1.38 cm/m  AORTIC VALVE               PULMONIC VALVE AV Vmax:      298.00 cm/s  PV Vmax:       1.01 m/s AV Vmean:     208.000 cm/s PV Peak grad:  4.1 mmHg AV VTI:       0.488 m AV Peak Grad: 35.5 mmHg AV Mean Grad: 20.0 mmHg  AORTA Ao Root diam: 3.30 cm  SHUNTS Systemic Diam: 2.00 cm Debbe Odea MD Electronically signed by Debbe Odea MD Signature Date/Time: 12/28/2023/5:29:18 PM    Final    US RENAL Result Date: 12/28/2023 CLINICAL DATA:  Acute kidney injury EXAM: RENAL / URINARY TRACT ULTRASOUND COMPLETE COMPARISON:  04/15/2022 FINDINGS: Right Kidney: Renal measurements: 11.4 x 5.8 x 5.8 cm = volume: 198 mL. Echogenicity within normal limits. No mass or hydronephrosis visualized. Left Kidney: Renal measurements: 10.5 x 6.6 x 6.2 cm = volume: 226 mL. Echogenicity within normal limits. No mass or hydronephrosis visualized. Bladder: Partially collapsed around Foley catheter balloon. Other: None. IMPRESSION: No significant sonographic abnormality of the kidneys. Electronically Signed   By: Acquanetta Belling M.D.   On: 12/28/2023 11:32   US Venous Img Upper Uni Right(DVT) Result Date: 12/28/2023 CLINICAL DATA:  Right humeral fracture now with pain and edema. Evaluate for DVT. EXAM: RIGHT UPPER EXTREMITY VENOUS DOPPLER ULTRASOUND TECHNIQUE: Gray-scale sonography with graded compression, as well as color Doppler and duplex ultrasound were performed to evaluate the upper extremity deep venous system from the level of the subclavian vein and including the jugular, axillary, basilic, radial, ulnar and upper cephalic vein. Spectral Doppler was utilized  to evaluate flow at rest and with distal augmentation maneuvers. COMPARISON:  None Available. FINDINGS: Contralateral Subclavian Vein: Respiratory phasicity is normal and symmetric with the symptomatic side. No evidence of thrombus. Normal compressibility. Internal Jugular Vein: No evidence of thrombus. Normal compressibility, respiratory phasicity and response to augmentation. Subclavian Vein: No evidence of thrombus. Normal compressibility, respiratory phasicity and response to augmentation. Axillary Vein: No evidence of thrombus. Normal compressibility, respiratory phasicity and response to augmentation. Cephalic Vein: No evidence of thrombus. Normal compressibility, respiratory phasicity and response to augmentation. Basilic Vein: No evidence of thrombus. Normal compressibility, respiratory phasicity and response to augmentation. Brachial Veins: No evidence of thrombus. Normal compressibility, respiratory phasicity and response to augmentation. Radial Veins: No evidence of thrombus. Normal compressibility, respiratory phasicity and response to augmentation. Ulnar Veins: No evidence of thrombus. Normal compressibility, respiratory phasicity and response to augmentation. Other Findings: There is a minimal amount of subcutaneous edema at the level of the right forearm. IMPRESSION: No evidence of DVT within the right upper extremity. Electronically Signed   By: Simonne Come M.D.   On: 12/28/2023 08:48   DG Chest Port 1 View Result Date: 12/27/2023 CLINICAL DATA:  Check central line placement EXAM: PORTABLE CHEST 1 VIEW COMPARISON:  Film from earlier in the same day. FINDINGS: The tube and gastric catheter are noted and stable. Temporary dialysis catheter via the left jugular approach is noted with the tip in proximal superior vena cava. No pneumothorax is noted. Skin fold is noted over the left apex. No focal infiltrate is seen. IMPRESSION: No pneumothorax following central line placement. Electronically Signed    By: Alcide Clever M.D.   On: 12/27/2023 23:08   US Venous Img Lower  Bilateral (DVT) Result Date: 12/27/2023 CLINICAL DATA:  Bilateral lower extremity edema. Recent fall. History of pulmonary embolism. Evaluate for DVT. EXAM: BILATERAL LOWER EXTREMITY VENOUS DOPPLER ULTRASOUND TECHNIQUE: Gray-scale sonography with graded compression, as well as color Doppler and duplex ultrasound were performed to evaluate the lower extremity deep venous systems from the level of the common femoral vein and including the common femoral, femoral, profunda femoral, popliteal and calf veins including the posterior tibial, peroneal and gastrocnemius veins when visible. The superficial great saphenous vein was also interrogated. Spectral Doppler was utilized to evaluate flow at rest and with distal augmentation maneuvers in the common femoral, femoral and popliteal veins. COMPARISON:  None Available. FINDINGS: RIGHT LOWER EXTREMITY Common Femoral Vein: There is nonocclusive wall thickening/chronic DVT involving the right common femoral vein (image 2). Saphenofemoral Junction: There is nonocclusive wall thickening involving the saphenofemoral junction (image 7). Profunda Femoral Vein: Appears patent where visualized. Femoral Vein: There is nonocclusive wall thickening/chronic DVT involving the proximal (image 15), mid (images 16 and 17) and distal (images 19 and 20) aspects of the femoral vein. Popliteal Vein: There is nonocclusive wall thickening/chronic DVT involving the right popliteal vein (images 22 and 23, images 25 and 26. Calf Veins: Appear patent where visualized. Superficial Great Saphenous Vein: No evidence of thrombus. Normal compressibility. Other Findings:  None. LEFT LOWER EXTREMITY Common Femoral Vein: There is nonocclusive wall thickening/chronic DVT involving the left common femoral vein (images 34 and 36). Saphenofemoral Junction: There is nonocclusive wall thickening/chronic DVT involving the saphenofemoral junction  (image 39). Profunda Femoral Vein: Appears patent where visualized. Femoral Vein: There is nonocclusive wall thickening involving the proximal (images 45 and 46), mid (images 48 and 49 and distal (image 52) aspects of the left femoral vein Popliteal Vein: There is nonocclusive wall thickening involving the left popliteal vein (image 55). Calf Veins: Appear patent where visualized. Superficial Great Saphenous Vein: No evidence of thrombus. Normal compressibility. Other Findings:  None. IMPRESSION: The examination is positive for extensive bilateral non occlusive wall thickening/chronic DVT extending from the level of the bilateral common femoral through the bilateral popliteal veins. No evidence of acute or occlusive DVT within either lower extremity. Electronically Signed   By: Simonne Come M.D.   On: 12/27/2023 16:33   CT HEAD WO CONTRAST ( ) Result Date: 12/27/2023 CLINICAL DATA:  Mental status change, unknown cause EXAM: CT HEAD WITHOUT CONTRAST TECHNIQUE: Contiguous axial images were obtained from the base of the skull through the vertex without intravenous contrast. RADIATION DOSE REDUCTION: This exam was performed according to the departmental dose-optimization program which includes automated exposure control, adjustment of the mA and/or kV according to patient size and/or use of iterative reconstruction technique. COMPARISON:  CT head May 06, 2011. FINDINGS: Brain: No evidence of acute infarction, hemorrhage, hydrocephalus, extra-axial collection or mass lesion/mass effect. Cerebral atrophy. Vascular: No hyperdense vessel. Skull: No acute fracture. Sinuses/Orbits: Mild paranasal sinus mucosal thickening. No acute orbital findings. Right scleral buckle. Other: No mastoid effusions. IMPRESSION: 1. No evidence of acute intracranial abnormality. 2.  Cerebral Atrophy (ICD10-G31.9). Electronically Signed   By: Feliberto Harts M.D.   On: 12/27/2023 16:08   DG Abdomen 1 View Result Date:  12/27/2023 CLINICAL DATA:  ET and orogastric tube placement EXAM: ABDOMEN - 1 VIEW COMPARISON:  None Available. FINDINGS: Single view of the left upper quadrant and lower left chest demonstrates an NG tube terminating in the upper abdomen, likely within the gastric body. Anatomy is distorted by the extent of left hemidiaphragm elevation.  No gross free intraperitoneal air. IMPRESSION: A nasogastric tube terminating in the upper abdomen, likely within the gastric body. Anatomic distortion secondary to left hemidiaphragm elevation. Electronically Signed   By: Jeronimo Greaves M.D.   On: 12/27/2023 14:45   DG Chest Port 1 View Result Date: 12/27/2023 CLINICAL DATA:  Possible sepsis. Status post ET and nasogastric tube placement EXAM: PORTABLE CHEST 1 VIEW COMPARISON:  03/02/2023 FINDINGS: Marked left hemidiaphragm elevation. Endotracheal tube terminates 5.0 cm above carina. Nasogastric tube terminates in the upper abdomen, likely in the gastric body. Apical lordotic positioning with minimal exclusion of the upper chest. Mild cardiomegaly. No pneumothorax. Cannot exclude layering small right pleural effusion. Nonspecific interstitial coarsening. Left base scarring or atelectasis. IMPRESSION: Appropriate position of endotracheal tube. Nasogastric tube likely terminates in the gastric body. Anatomy is distorted by markedly elevated left hemidiaphragm. Somewhat technique limited portable radiograph. Cannot exclude small layering right pleural effusion. Cardiomegaly. Electronically Signed   By: Jeronimo Greaves M.D.   On: 12/27/2023 14:43    Microbiology: Recent Results (from the past 240 hours)  Culture, blood (Routine X 2) w Reflex to ID Panel     Status: None   Collection Time: 01/02/24  2:58 PM   Specimen: BLOOD  Result Value Ref Range Status   Specimen Description BLOOD BLOOD LEFT HAND  Final   Special Requests   Final    BOTTLES DRAWN AEROBIC AND ANAEROBIC Blood Culture results may not be optimal due to an  inadequate volume of blood received in culture bottles   Culture   Final    NO GROWTH 5 DAYS Performed at [facility], [address] [place], [place] [address]    Report Status 01/07/2024 FINAL  Final  Culture, blood (Routine X 2) w Reflex to ID Panel     Status: None   Collection Time: 01/02/24  3:06 PM   Specimen: BLOOD  Result Value Ref Range Status   Specimen Description BLOOD BLOOD LEFT WRIST  Final   Special Requests   Final    BOTTLES DRAWN AEROBIC AND ANAEROBIC Blood Culture adequate volume   Culture   Final    NO GROWTH 5 DAYS Performed at [facility], [address] [place], [place] [address]    Report Status 01/07/2024 FINAL  Final     Labs: Basic Metabolic Panel: Recent Labs  Lab 01/06/24 0621 01/07/24 0419 01/08/24 0430 01/10/24 0548 01/11/24 0340 01/11/24 1635  NA 141 141 142 142 147* 144  K 3.8 3.6 3.5 3.9 4.0 4.5  CL 102 100 97* 100 103 101  CO2 30 33* 35* 32 35* 28  GLUCOSE 97 105* 93 124* 124* 87  BUN 40* 37* 37* 41* 53* 57*  CREATININE 1.81* 1.87* 1.85* 2.21* 2.56* 2.80*  CALCIUM 8.5* 8.5* 8.8* 8.9 9.2 9.0  MG 2.0  --   --  2.1 2.4  --   PHOS 3.0  --   --  3.4 4.1  --    Liver Function Tests: No results for input(s): "AST", "ALT", "ALKPHOS", "BILITOT", "PROT", "ALBUMIN" in the last 168 hours. No results for input(s): "LIPASE", "AMYLASE" in the last 168 hours. No results for input(s): "AMMONIA" in the last 168 hours. CBC: Recent Labs  Lab 01/06/24 0621 01/07/24 0419 01/08/24 0430 01/11/24 0942  WBC 6.0 6.5 6.1 3.9*  HGB 7.9* 8.2* 8.2* 7.6*  HCT 25.5* 25.0* 26.5* 26.5*  MCV 100.0 102.5* 100.4* 108.6*  PLT 220 241 225 161   Cardiac Enzymes: No results  for input(s): "CKTOTAL", "CKMB", "CKMBINDEX", "TROPONINI" in the last 168 hours. BNP: BNP (last 3 results) Recent Labs    12/27/23 1650 01/11/24 0942  BNP 1,760.3* 1,762.6*    ProBNP (last 3 results) No results for input(s): "PROBNP" in the last 8760  hours.  CBG: Recent Labs  Lab 01/08/24 2025 01/09/24 0019 01/09/24 0724 01/09/24 1135 01/11/24 0954  GLUCAP 125* 113* 98 126* 110*       Signed:  Silvano Bilis MD.  [facility] 01/12/2024, 10:12 AM

## 2024-01-12 NOTE — Plan of Care (Signed)
  Problem: Education: Goal: Knowledge of General Education information will improve Description: Including pain rating scale, medication(s)/side effects and non-pharmacologic comfort measures Outcome: Progressing   Problem: Clinical Measurements: Goal: Ability to maintain clinical measurements within normal limits will improve Outcome: Progressing Goal: Will remain free from infection Outcome: Progressing Goal: Diagnostic test results will improve Outcome: Progressing Goal: Respiratory complications will improve Outcome: Progressing Goal: Cardiovascular complication will be avoided Outcome: Progressing   Problem: Activity: Goal: Risk for activity intolerance will decrease Outcome: Progressing   Problem: Elimination: Goal: Will not experience complications related to bowel motility Outcome: Progressing Goal: Will not experience complications related to urinary retention Outcome: Progressing   Problem: Pain Management: Goal: General experience of comfort will improve Outcome: Progressing

## 2024-01-18 ENCOUNTER — Telehealth: Payer: Self-pay

## 2024-01-18 NOTE — Telephone Encounter (Signed)
Copied from CRM [id number]. Topic: General - Deceased Patient >> 02/04/24  2:10 PM Shon Hale wrote: Name of caller: [place]  Date of death: 01-Feb-2024   Name of funeral home: [facility]  Phone number of funeral home: [phone]  Provider that needs to sign form: Dr. Sherrie Mustache  Timeline for signing: As soon as possible

## 2024-01-18 NOTE — Telephone Encounter (Signed)
Was completed on 01-16-2024

## 2024-01-28 DEATH — deceased

## 2024-09-07 ENCOUNTER — Other Ambulatory Visit: Payer: Medicare HMO

## 2024-09-21 ENCOUNTER — Ambulatory Visit: Payer: Medicare HMO | Admitting: Internal Medicine
